# Patient Record
Sex: Female | Born: 1980 | Race: White | Hispanic: No | Smoking: Current every day smoker
Health system: Southern US, Community
[De-identification: ages and names within clinical notes are randomized; demographics above are authoritative.]

## PROBLEM LIST (undated history)

## (undated) DIAGNOSIS — J45909 Unspecified asthma, uncomplicated: Secondary | ICD-10-CM

## (undated) DIAGNOSIS — IMO0001 Reserved for inherently not codable concepts without codable children: Secondary | ICD-10-CM

## (undated) DIAGNOSIS — F319 Bipolar disorder, unspecified: Secondary | ICD-10-CM

## (undated) DIAGNOSIS — G629 Polyneuropathy, unspecified: Secondary | ICD-10-CM

## (undated) DIAGNOSIS — R55 Syncope and collapse: Secondary | ICD-10-CM

## (undated) DIAGNOSIS — F329 Major depressive disorder, single episode, unspecified: Secondary | ICD-10-CM

## (undated) DIAGNOSIS — F32A Depression, unspecified: Secondary | ICD-10-CM

## (undated) DIAGNOSIS — F419 Anxiety disorder, unspecified: Secondary | ICD-10-CM

## (undated) DIAGNOSIS — E118 Type 2 diabetes mellitus with unspecified complications: Secondary | ICD-10-CM

## (undated) DIAGNOSIS — Z794 Long term (current) use of insulin: Secondary | ICD-10-CM

## (undated) DIAGNOSIS — E1140 Type 2 diabetes mellitus with diabetic neuropathy, unspecified: Secondary | ICD-10-CM

## (undated) HISTORY — PX: TUBAL LIGATION: SHX77

---

## 2011-11-01 ENCOUNTER — Encounter (HOSPITAL_COMMUNITY): Payer: Self-pay | Admitting: *Deleted

## 2011-11-01 ENCOUNTER — Emergency Department (HOSPITAL_COMMUNITY): Payer: Medicaid Other

## 2011-11-01 ENCOUNTER — Emergency Department (HOSPITAL_COMMUNITY)
Admission: EM | Admit: 2011-11-01 | Discharge: 2011-11-02 | Disposition: A | Discharge status: Home / Self Care | Payer: Medicaid Other | Attending: Emergency Medicine | Admitting: Emergency Medicine

## 2011-11-01 ENCOUNTER — Other Ambulatory Visit: Payer: Self-pay

## 2011-11-01 DIAGNOSIS — R739 Hyperglycemia, unspecified: Secondary | ICD-10-CM

## 2011-11-01 DIAGNOSIS — J45909 Unspecified asthma, uncomplicated: Secondary | ICD-10-CM | POA: Insufficient documentation

## 2011-11-01 DIAGNOSIS — E119 Type 2 diabetes mellitus without complications: Secondary | ICD-10-CM | POA: Insufficient documentation

## 2011-11-01 DIAGNOSIS — Z794 Long term (current) use of insulin: Secondary | ICD-10-CM | POA: Insufficient documentation

## 2011-11-01 DIAGNOSIS — N390 Urinary tract infection, site not specified: Secondary | ICD-10-CM

## 2011-11-01 DIAGNOSIS — Z79899 Other long term (current) drug therapy: Secondary | ICD-10-CM | POA: Insufficient documentation

## 2011-11-01 DIAGNOSIS — E860 Dehydration: Secondary | ICD-10-CM

## 2011-11-01 HISTORY — DX: Anxiety disorder, unspecified: F41.9

## 2011-11-01 HISTORY — DX: Depression, unspecified: F32.A

## 2011-11-01 HISTORY — DX: Polyneuropathy, unspecified: G62.9

## 2011-11-01 HISTORY — DX: Unspecified asthma, uncomplicated: J45.909

## 2011-11-01 HISTORY — DX: Major depressive disorder, single episode, unspecified: F32.9

## 2011-11-01 LAB — CBC WITH DIFFERENTIAL/PLATELET
Basophils Absolute: 0 10*3/uL (ref 0.0–0.1)
Basophils Relative: 0 % (ref 0–1)
Eosinophils Absolute: 0 10*3/uL (ref 0.0–0.7)
HCT: 39.1 % (ref 36.0–46.0)
Hemoglobin: 13.5 g/dL (ref 12.0–15.0)
MCH: 30.3 pg (ref 26.0–34.0)
MCHC: 34.5 g/dL (ref 30.0–36.0)
Monocytes Absolute: 0.4 10*3/uL (ref 0.1–1.0)
Monocytes Relative: 4 % (ref 3–12)
RDW: 13.4 % (ref 11.5–15.5)

## 2011-11-01 LAB — GLUCOSE, CAPILLARY: Glucose-Capillary: 66 mg/dL — ABNORMAL LOW (ref 70–99)

## 2011-11-01 NOTE — ED Provider Notes (Signed)
History     CSN: [id number]  Arrival date & time 11/01/11  2214   First MD Initiated Contact with Patient 11/01/11 2240      Chief Complaint  Patient presents with  . Chest Pain    cough  . Emesis  . Hyperglycemia    (Consider location/radiation/quality/duration/timing/severity/associated sxs/prior treatment) HPI  31 y.o. female in no acute distress complaining of myalgias, rduced PO intake, vomiting 4 times this week (reportschildren are sick, she is now tolerating PO). She does report a pleuritic chest pain and dry cough. Patient reports she had high blood sugar yesterday (500's symptomatic with polyuria and polydipsia) on insulin pump and the insulin pump was not sitting correctly, she was not receiving her insulin today her sugars are much better. She denies any shortness of breath, urinary symptoms, change in bowel habits, HA.   Past Medical History  Diagnosis Date  . Diabetes mellitus     insulin dependant  . Asthma   . Anxiety   . Depression   . Neuropathy     Past Surgical History  Procedure Date  . Tubal ligation     No family history on file.  History  Substance Use Topics  . Smoking status: Not on file  . Smokeless tobacco: Not on file  . Alcohol Use:     OB History    Grav Para Term Preterm Abortions TAB SAB Ect Mult Living                  Review of Systems  Constitutional: Negative for fever.  Respiratory: Negative for shortness of breath.   Cardiovascular: Positive for chest pain.  Gastrointestinal: Positive for nausea and vomiting. Negative for abdominal pain and diarrhea.  Musculoskeletal: Positive for myalgias.  Skin: Negative for rash.  All other systems reviewed and are negative.    Allergies  Amoxicillin; Latex; and Penicillins  Home Medications   Current Outpatient Rx  Name Route Sig Dispense Refill  . ALBUTEROL SULFATE HFA 108 (90 BASE) MCG/ACT IN AERS Inhalation Inhale 2 puffs into the lungs every 6 (six) hours as needed.  For shortness of breath    . BECLOMETHASONE DIPROPIONATE 80 MCG/ACT IN AERS Inhalation Inhale 1 puff into the lungs 2 (two) times daily.    . INSULIN ASPART 100 UNIT/ML Parker Strip SOLN Subcutaneous Inject into the skin continuous. Via insulin pump    . ADULT MULTIVITAMIN W/MINERALS CH Oral Take 1 tablet by mouth daily.      BP 126/68  Pulse 115  Temp 99.2 F (37.3 C) (Oral)  Resp 19  SpO2 90%  LMP 10/01/2011  Physical Exam  Nursing note and vitals reviewed. Constitutional: She is oriented to person, place, and time. She appears well-developed and well-nourished. No distress.  HENT:  Head: Normocephalic.       Mucous membranes are dry  Eyes: Conjunctivae and EOM are normal.  Neck: Normal range of motion.  Cardiovascular: Normal rate, regular rhythm and normal heart sounds.   Pulmonary/Chest: Effort normal and breath sounds normal. No respiratory distress. She has no wheezes. She has no rales. She exhibits no tenderness.  Abdominal: Soft. Bowel sounds are normal.  Musculoskeletal: Normal range of motion.  Lymphadenopathy:    She has no cervical adenopathy.  Neurological: She is alert and oriented to person, place, and time.  Skin: Skin is warm.  Psychiatric: She has a normal mood and affect.    ED Course  Procedures (including critical care time)  Labs Reviewed  GLUCOSE, CAPILLARY -  Abnormal; Notable for the following:    Glucose-Capillary 66 (*)     All other components within normal limits  CBC WITH DIFFERENTIAL - Abnormal; Notable for the following:    Neutrophils Relative 89 (*)     Neutro Abs 8.6 (*)     Lymphocytes Relative 6 (*)     Lymphs Abs 0.6 (*)     All other components within normal limits  COMPREHENSIVE METABOLIC PANEL - Abnormal; Notable for the following:    Glucose, Bld 114 (*)     All other components within normal limits  PREGNANCY, URINE  URINALYSIS, ROUTINE W REFLEX MICROSCOPIC   Dg Chest Port 1 View  11/01/2011  *RADIOLOGY REPORT*  Clinical Data: Chest  pain and cough.  Hyperglycemia.  PORTABLE CHEST - 1 VIEW  Comparison: 02/03/2010  Findings: The heart size and pulmonary vascularity are normal. The lungs appear clear and expanded without focal air space disease or consolidation. No blunting of the costophrenic angles.  No pneumothorax.  Mediastinal contours appear intact.  No significant change since previous study.  IMPRESSION: No evidence of active pulmonary disease.   Original Report Authenticated By: Marlon Pel, M.D.      1. Dehydration       MDM  Patient has stable blood sugar, chest x-ray shows no infiltrates and  she is currently tolerating PO. She is tachycardic likely because she is dehydrated from Polyuria from high sugars yesterday. I will hydrate her to normalize her vital signs. I have spent extensive time counseling them that primary care needs to be established for management of chronic diabetes.  Myalgia and fever likely secondary to viral syndrome she is the same symptoms as her children. I will encourage symptomatic treatment, with Motrin for fever control.   Care remains with Dr. Effie Shy at shift change.  New Prescriptions   ONDANSETRON (ZOFRAN) 4 MG TABLET    Take 1 tablet (4 mg total) by mouth every 8 (eight) hours as needed for nausea.   OXYCODONE-ACETAMINOPHEN (PERCOCET/ROXICET) 5-325 MG PER TABLET    1 to 2 tabs PO q6hrs  PRN for pain           Wynetta Emery, PA-C 11/02/11 0126  Wynetta Emery, PA-C 11/02/11 0151

## 2011-11-01 NOTE — ED Notes (Signed)
Insulin dependant diabetic to ED c/o chest pain, cough, emesis and hyperglycemia.  Pt came to ED b/c she has been vomiting all day.  Her insulin pump was not working yesterday b/c it was kinked.

## 2011-11-01 NOTE — ED Notes (Signed)
Pt. C/o generalized pain, states "even my skin hurts". Pt. Reports vomiting today. States BG was high yesterday but was low today.

## 2011-11-02 LAB — URINALYSIS, ROUTINE W REFLEX MICROSCOPIC
Bilirubin Urine: NEGATIVE
Glucose, UA: NEGATIVE mg/dL
Ketones, ur: 15 mg/dL — AB
Nitrite: NEGATIVE
Protein, ur: NEGATIVE mg/dL
pH: 5.5 (ref 5.0–8.0)

## 2011-11-02 LAB — COMPREHENSIVE METABOLIC PANEL
Albumin: 3.9 g/dL (ref 3.5–5.2)
BUN: 10 mg/dL (ref 6–23)
Calcium: 8.7 mg/dL (ref 8.4–10.5)
Creatinine, Ser: 0.61 mg/dL (ref 0.50–1.10)
Total Protein: 6.8 g/dL (ref 6.0–8.3)

## 2011-11-02 LAB — URINE MICROSCOPIC-ADD ON

## 2011-11-02 LAB — PREGNANCY, URINE: Preg Test, Ur: NEGATIVE

## 2011-11-02 MED ORDER — KETOROLAC TROMETHAMINE 30 MG/ML IJ SOLN
30.0000 mg | Freq: Once | INTRAMUSCULAR | Status: AC
  Administered 2011-11-02: 30 mg via INTRAVENOUS
  Filled 2011-11-02: qty 1

## 2011-11-02 MED ORDER — MORPHINE SULFATE 4 MG/ML IJ SOLN
4.0000 mg | Freq: Once | INTRAMUSCULAR | Status: AC
  Administered 2011-11-02: 4 mg via INTRAVENOUS
  Filled 2011-11-02: qty 1

## 2011-11-02 MED ORDER — ONDANSETRON HCL 4 MG/2ML IJ SOLN
INTRAMUSCULAR | Status: AC
  Administered 2011-11-02: 4 mg via INTRAVENOUS
  Filled 2011-11-02: qty 2

## 2011-11-02 MED ORDER — ONDANSETRON HCL 4 MG PO TABS: 4.0000 mg | ORAL_TABLET | Freq: Three times a day (TID) | ORAL | Status: AC | PRN

## 2011-11-02 MED ORDER — OXYCODONE-ACETAMINOPHEN 5-325 MG PO TABS: ORAL_TABLET | ORAL | Status: AC

## 2011-11-02 MED ORDER — CIPROFLOXACIN HCL 500 MG PO TABS: 500.0000 mg | ORAL_TABLET | Freq: Once | ORAL | Status: AC

## 2011-11-02 MED ORDER — ONDANSETRON HCL 4 MG/2ML IJ SOLN
4.0000 mg | Freq: Once | INTRAMUSCULAR | Status: AC
  Administered 2011-11-02: 4 mg via INTRAVENOUS

## 2011-11-02 MED ORDER — CIPROFLOXACIN HCL 500 MG PO TABS
500.0000 mg | ORAL_TABLET | Freq: Once | ORAL | Status: AC
  Administered 2011-11-02: 500 mg via ORAL
  Filled 2011-11-02: qty 1

## 2011-11-02 MED ORDER — SODIUM CHLORIDE 0.9 % IV BOLUS (SEPSIS)
1000.0000 mL | Freq: Once | INTRAVENOUS | Status: AC
  Administered 2011-11-02: 1000 mL via INTRAVENOUS

## 2011-11-02 NOTE — ED Notes (Signed)
Pt refused rectal temp.

## 2011-11-02 NOTE — ED Provider Notes (Signed)
Medical screening examination/treatment/procedure(s) were conducted as a shared visit with non-physician practitioner(s) and myself.  I personally evaluated the patient during the encounter  Flint Melter, MD 11/02/11 [phone]

## 2011-11-02 NOTE — ED Provider Notes (Signed)
Melody Myers is a 31 y.o. female  who is here for vomiting, and hyperglycemia. She feels better now after treatment with IV fluids. In emergency department. Her urinalysis returned consistent with infection. She has had urinary frequency recently, but no dysuria. Heart rate, now in the 90s over the last hour. She feels comfortable with discharge at this time.   Plan: Cipro, started in emergency department, prescription given. She is encouraged to drink 1-2 extra liters of water each day and followup with her PCP, next week.   Medical screening examination/treatment/procedure(s) were conducted as a shared visit with non-physician practitioner(s) and myself.  I personally evaluated the patient during the encounter  Flint Melter, MD 11/02/11 [phone]

## 2012-10-11 ENCOUNTER — Telehealth: Payer: Self-pay | Admitting: Obstetrics & Gynecology

## 2012-10-11 NOTE — Telephone Encounter (Signed)
Called last evening because hadn't had a bowel movement but "felt like it was right there".  Moving well.  Normal urinary function.  No fevers.  Feeling fine except for some cramping due to constipation.  Advised okay to use dulcolax suppository and if no BM to start Miralax every 12 hrs until has BM.    She called again this am to report she had a BM and is really feeling good except she sneezed and her incision where hematoma had occured was oozing some dark blood.  Not active.  No surrounding erythema.  No fevers.  Not even really tender.  She had a washcloth on it and it is just oozing a little.  advised to app,y pressure and just watch. Ok to apply dressing.  Per her description, sounds like no signs of infection.  Has appt tomorrow with Dr Farrel Gobble.  Pt to call back with any changes.

## 2013-02-04 DIAGNOSIS — M259 Joint disorder, unspecified: Secondary | ICD-10-CM | POA: Insufficient documentation

## 2013-02-04 DIAGNOSIS — G5600 Carpal tunnel syndrome, unspecified upper limb: Secondary | ICD-10-CM | POA: Insufficient documentation

## 2013-02-04 HISTORY — DX: Carpal tunnel syndrome, unspecified upper limb: G56.00

## 2013-03-19 ENCOUNTER — Encounter (HOSPITAL_COMMUNITY): Payer: Self-pay | Admitting: Emergency Medicine

## 2013-03-19 ENCOUNTER — Inpatient Hospital Stay (HOSPITAL_COMMUNITY)
Admission: EM | Admit: 2013-03-19 | Discharge: 2013-03-21 | DRG: 638 | Disposition: A | Discharge status: Home / Self Care | Payer: Medicare Other | Attending: Internal Medicine | Admitting: Internal Medicine

## 2013-03-19 ENCOUNTER — Emergency Department (HOSPITAL_COMMUNITY): Payer: Medicare Other

## 2013-03-19 DIAGNOSIS — Z720 Tobacco use: Secondary | ICD-10-CM | POA: Diagnosis present

## 2013-03-19 DIAGNOSIS — Z794 Long term (current) use of insulin: Secondary | ICD-10-CM

## 2013-03-19 DIAGNOSIS — Z23 Encounter for immunization: Secondary | ICD-10-CM

## 2013-03-19 DIAGNOSIS — F419 Anxiety disorder, unspecified: Secondary | ICD-10-CM | POA: Diagnosis present

## 2013-03-19 DIAGNOSIS — E1110 Type 2 diabetes mellitus with ketoacidosis without coma: Secondary | ICD-10-CM

## 2013-03-19 DIAGNOSIS — J4540 Moderate persistent asthma, uncomplicated: Secondary | ICD-10-CM | POA: Diagnosis present

## 2013-03-19 DIAGNOSIS — E1010 Type 1 diabetes mellitus with ketoacidosis without coma: Principal | ICD-10-CM | POA: Diagnosis present

## 2013-03-19 DIAGNOSIS — G629 Polyneuropathy, unspecified: Secondary | ICD-10-CM | POA: Insufficient documentation

## 2013-03-19 DIAGNOSIS — E1040 Type 1 diabetes mellitus with diabetic neuropathy, unspecified: Secondary | ICD-10-CM | POA: Diagnosis present

## 2013-03-19 DIAGNOSIS — E118 Type 2 diabetes mellitus with unspecified complications: Secondary | ICD-10-CM

## 2013-03-19 DIAGNOSIS — D72829 Elevated white blood cell count, unspecified: Secondary | ICD-10-CM | POA: Diagnosis present

## 2013-03-19 DIAGNOSIS — J45901 Unspecified asthma with (acute) exacerbation: Secondary | ICD-10-CM | POA: Diagnosis present

## 2013-03-19 DIAGNOSIS — K5900 Constipation, unspecified: Secondary | ICD-10-CM | POA: Diagnosis present

## 2013-03-19 DIAGNOSIS — E1140 Type 2 diabetes mellitus with diabetic neuropathy, unspecified: Secondary | ICD-10-CM | POA: Diagnosis present

## 2013-03-19 DIAGNOSIS — F319 Bipolar disorder, unspecified: Secondary | ICD-10-CM | POA: Diagnosis present

## 2013-03-19 DIAGNOSIS — Z9641 Presence of insulin pump (external) (internal): Secondary | ICD-10-CM

## 2013-03-19 DIAGNOSIS — E1065 Type 1 diabetes mellitus with hyperglycemia: Secondary | ICD-10-CM | POA: Diagnosis present

## 2013-03-19 DIAGNOSIS — F17200 Nicotine dependence, unspecified, uncomplicated: Secondary | ICD-10-CM | POA: Diagnosis present

## 2013-03-19 DIAGNOSIS — F411 Generalized anxiety disorder: Secondary | ICD-10-CM | POA: Diagnosis present

## 2013-03-19 DIAGNOSIS — E1142 Type 2 diabetes mellitus with diabetic polyneuropathy: Secondary | ICD-10-CM | POA: Diagnosis present

## 2013-03-19 DIAGNOSIS — J45909 Unspecified asthma, uncomplicated: Secondary | ICD-10-CM | POA: Diagnosis present

## 2013-03-19 DIAGNOSIS — F3130 Bipolar disorder, current episode depressed, mild or moderate severity, unspecified: Secondary | ICD-10-CM | POA: Diagnosis present

## 2013-03-19 DIAGNOSIS — E1049 Type 1 diabetes mellitus with other diabetic neurological complication: Secondary | ICD-10-CM | POA: Diagnosis present

## 2013-03-19 DIAGNOSIS — F32A Depression, unspecified: Secondary | ICD-10-CM | POA: Diagnosis present

## 2013-03-19 DIAGNOSIS — F329 Major depressive disorder, single episode, unspecified: Secondary | ICD-10-CM | POA: Diagnosis present

## 2013-03-19 DIAGNOSIS — G8929 Other chronic pain: Secondary | ICD-10-CM | POA: Diagnosis present

## 2013-03-19 HISTORY — DX: Type 2 diabetes mellitus with ketoacidosis without coma: E11.10

## 2013-03-19 HISTORY — DX: Type 2 diabetes mellitus with unspecified complications: E11.8

## 2013-03-19 HISTORY — DX: Type 2 diabetes mellitus with diabetic neuropathy, unspecified: E11.40

## 2013-03-19 HISTORY — DX: Reserved for inherently not codable concepts without codable children: IMO0001

## 2013-03-19 HISTORY — DX: Long term (current) use of insulin: Z79.4

## 2013-03-19 LAB — GLUCOSE, CAPILLARY
GLUCOSE-CAPILLARY: 495 mg/dL — AB (ref 70–99)
Glucose-Capillary: 490 mg/dL — ABNORMAL HIGH (ref 70–99)

## 2013-03-19 LAB — POCT I-STAT, CHEM 8
BUN: 19 mg/dL (ref 6–23)
CALCIUM ION: 1.12 mmol/L (ref 1.12–1.23)
Chloride: 96 mEq/L (ref 96–112)
Creatinine, Ser: 0.8 mg/dL (ref 0.50–1.10)
Glucose, Bld: 552 mg/dL (ref 70–99)
HCT: 46 % (ref 36.0–46.0)
HEMOGLOBIN: 15.6 g/dL — AB (ref 12.0–15.0)
Potassium: 4.5 mEq/L (ref 3.7–5.3)
SODIUM: 130 meq/L — AB (ref 137–147)
TCO2: 20 mmol/L (ref 0–100)

## 2013-03-19 LAB — COMPREHENSIVE METABOLIC PANEL
ALBUMIN: 4.2 g/dL (ref 3.5–5.2)
ALK PHOS: 93 U/L (ref 39–117)
ALT: 11 U/L (ref 0–35)
AST: 12 U/L (ref 0–37)
BILIRUBIN TOTAL: 0.5 mg/dL (ref 0.3–1.2)
BUN: 17 mg/dL (ref 6–23)
CHLORIDE: 90 meq/L — AB (ref 96–112)
CO2: 17 meq/L — AB (ref 19–32)
CREATININE: 0.77 mg/dL (ref 0.50–1.10)
Calcium: 9.5 mg/dL (ref 8.4–10.5)
GFR calc Af Amer: >90 mL/min (ref >90)
Glucose, Bld: 541 mg/dL — ABNORMAL HIGH (ref 70–99)
POTASSIUM: 4.6 meq/L (ref 3.7–5.3)
Sodium: 128 mEq/L — ABNORMAL LOW (ref 137–147)
Total Protein: 7.6 g/dL (ref 6.0–8.3)

## 2013-03-19 LAB — CBC
HEMATOCRIT: 42.3 % (ref 36.0–46.0)
Hemoglobin: 14.5 g/dL (ref 12.0–15.0)
MCH: 29.3 pg (ref 26.0–34.0)
MCHC: 34.3 g/dL (ref 30.0–36.0)
MCV: 85.5 fL (ref 78.0–100.0)
PLATELETS: 180 10*3/uL (ref 150–400)
RBC: 4.95 MIL/uL (ref 3.87–5.11)
RDW: 13.3 % (ref 11.5–15.5)
WBC: 16.6 10*3/uL — AB (ref 4.0–10.5)

## 2013-03-19 LAB — URINALYSIS, ROUTINE W REFLEX MICROSCOPIC
BILIRUBIN URINE: NEGATIVE
HGB URINE DIPSTICK: NEGATIVE
KETONES UR: 40 mg/dL — AB
Leukocytes, UA: NEGATIVE
Nitrite: NEGATIVE
PROTEIN: NEGATIVE mg/dL
Specific Gravity, Urine: 1.028 (ref 1.005–1.030)
UROBILINOGEN UA: 0.2 mg/dL (ref 0.0–1.0)
pH: 5.5 (ref 5.0–8.0)

## 2013-03-19 LAB — URINE MICROSCOPIC-ADD ON

## 2013-03-19 LAB — POCT I-STAT TROPONIN I: TROPONIN I, POC: 0.02 ng/mL (ref 0.00–0.08)

## 2013-03-19 LAB — POCT PREGNANCY, URINE: PREG TEST UR: NEGATIVE

## 2013-03-19 LAB — LIPASE, BLOOD: LIPASE: 19 U/L (ref 11–59)

## 2013-03-19 MED ORDER — ALBUTEROL (5 MG/ML) CONTINUOUS INHALATION SOLN
15.0000 mg/h | INHALATION_SOLUTION | Freq: Once | RESPIRATORY_TRACT | Status: AC
  Administered 2013-03-19: 15 mg/h via RESPIRATORY_TRACT
  Filled 2013-03-19: qty 20

## 2013-03-19 MED ORDER — METHYLPREDNISOLONE SODIUM SUCC 125 MG IJ SOLR
125.0000 mg | Freq: Once | INTRAMUSCULAR | Status: AC
  Administered 2013-03-19: 125 mg via INTRAVENOUS
  Filled 2013-03-19: qty 2

## 2013-03-19 MED ORDER — INSULIN REGULAR BOLUS VIA INFUSION
0.0000 [IU] | Freq: Three times a day (TID) | INTRAVENOUS | Status: DC
  Administered 2013-03-20: 2 [IU] via INTRAVENOUS
  Administered 2013-03-20: 2.8 [IU] via INTRAVENOUS
  Administered 2013-03-20: 3.1 [IU] via INTRAVENOUS
  Filled 2013-03-19: qty 10

## 2013-03-19 MED ORDER — SODIUM CHLORIDE 0.9 % IV BOLUS (SEPSIS)
1000.0000 mL | Freq: Once | INTRAVENOUS | Status: AC
  Administered 2013-03-19: 1000 mL via INTRAVENOUS

## 2013-03-19 MED ORDER — INSULIN REGULAR HUMAN 100 UNIT/ML IJ SOLN
INTRAMUSCULAR | Status: DC
  Administered 2013-03-19: 4.4 [IU]/h via INTRAVENOUS
  Administered 2013-03-20: 11.6 [IU]/h via INTRAVENOUS
  Administered 2013-03-20: 5.4 [IU]/h via INTRAVENOUS
  Administered 2013-03-20: 7.1 [IU]/h via INTRAVENOUS
  Administered 2013-03-20: 8.3 [IU]/h via INTRAVENOUS
  Administered 2013-03-20: 10 [IU]/h via INTRAVENOUS
  Administered 2013-03-20: 3.4 [IU]/h via INTRAVENOUS
  Administered 2013-03-20: 11:00:00 via INTRAVENOUS
  Administered 2013-03-20: 11.5 [IU]/h via INTRAVENOUS
  Filled 2013-03-19 (×2): qty 1

## 2013-03-19 MED ORDER — IPRATROPIUM BROMIDE 0.02 % IN SOLN
1.0000 mg | Freq: Once | RESPIRATORY_TRACT | Status: AC
  Administered 2013-03-19: 1 mg via RESPIRATORY_TRACT
  Filled 2013-03-19: qty 5

## 2013-03-19 MED ORDER — DEXTROSE-NACL 5-0.45 % IV SOLN
INTRAVENOUS | Status: DC
  Administered 2013-03-20: 18:00:00 via INTRAVENOUS
  Administered 2013-03-20: 75 mL/h via INTRAVENOUS

## 2013-03-19 MED ORDER — DEXTROSE 50 % IV SOLN: 25.0000 mL | INTRAVENOUS | Status: DC | PRN

## 2013-03-19 MED ORDER — SODIUM CHLORIDE 0.9 % IV SOLN
INTRAVENOUS | Status: DC
  Administered 2013-03-19: 125 mL/h via INTRAVENOUS
  Administered 2013-03-21: 02:00:00 via INTRAVENOUS

## 2013-03-19 NOTE — ED Notes (Signed)
Dr Silverio Lay aware of critical labs on I stat chem 8

## 2013-03-19 NOTE — ED Notes (Signed)
Per EMS , Pt. Walked in to [facility]. And complaint of "flu like symptoms" and also claimed of having hyperglycemia, sugar checked on the scene was 544 mg/dl., pt. Reports that she has an insulin pump and she just gave her a bolus and did not say how much.  Pt denies SOB with also claimed of chills and cough.

## 2013-03-19 NOTE — ED Provider Notes (Signed)
CSN: [id number]     Arrival date & time 03/19/13  2142 History   First MD Initiated Contact with Patient 03/19/13 2159     Chief Complaint  Patient presents with  . Hyperglycemia  . flu like sypmtom    (Consider location/radiation/quality/duration/timing/severity/associated sxs/prior Treatment) The history is provided by the patient.  Melody Myers is a 33 y.o. female hx of DM, asthma, depression and with flu syndrome, hyperglycemia. Patient has some flulike symptoms for the last 3 days. She's been wheezing throughout and has been using her albuterol with minimal relief. Also been nauseous and vomiting today and notes that her blood sugar was 544 at home. Some chills no fever. She is an dependent diabetic and had an insulin pump.    Past Medical History  Diagnosis Date  . Diabetes mellitus     insulin dependant  . Asthma   . Anxiety   . Depression   . Neuropathy    Past Surgical History  Procedure Laterality Date  . Tubal ligation     No family history on file. History  Substance Use Topics  . Smoking status: Not on file  . Smokeless tobacco: Not on file  . Alcohol Use: Yes     Comment: occasional   OB History   Grav Para Term Preterm Abortions TAB SAB Ect Mult Living                 Review of Systems  Respiratory: Positive for shortness of breath.   Gastrointestinal: Positive for nausea.  All other systems reviewed and are negative.    Allergies  Amoxicillin; Latex; and Penicillins  Home Medications   Current Outpatient Rx  Name  Route  Sig  Dispense  Refill  . albuterol (PROVENTIL HFA;VENTOLIN HFA) 108 (90 BASE) MCG/ACT inhaler   Inhalation   Inhale 2 puffs into the lungs every 6 (six) hours as needed. For shortness of breath         . beclomethasone (QVAR) 80 MCG/ACT inhaler   Inhalation   Inhale 1 puff into the lungs 2 (two) times daily.         . insulin aspart (NOVOLOG) 100 UNIT/ML injection   Subcutaneous   Inject into the skin continuous. Via  insulin pump         . Multiple Vitamin (MULTIVITAMIN WITH MINERALS) TABS   Oral   Take 1 tablet by mouth daily.          BP 116/64  Pulse 111  Temp(Src) 98.5 F (36.9 C) (Oral)  Resp 24  Ht 5\' 4"  (1.626 m)  Wt 124 lb (56.246 kg)  BMI 21.27 kg/m2  SpO2 99%  LMP 03/12/2013 Physical Exam  Nursing note and vitals reviewed. Constitutional: She is oriented to person, place, and time.  Chronically ill, tired, dehydrated   HENT:  Head: Normocephalic and atraumatic.  MM dry   Eyes: Conjunctivae are normal. Pupils are equal, round, and reactive to light.  Neck: Normal range of motion. Neck supple.  Cardiovascular: Regular rhythm and normal heart sounds.   Tachy   Pulmonary/Chest:  Diffuse wheezing, no retractions   Abdominal: Soft. Bowel sounds are normal.  Firm, mild epigastric tenderness. Insulin pump seem to be in place with no surrounding erythema   Musculoskeletal: Normal range of motion.  Neurological: She is alert and oriented to person, place, and time.  Skin: Skin is warm and dry.  Psychiatric: She has a normal mood and affect. Her behavior is normal. Judgment and thought content  normal.    ED Course  Procedures (including critical care time) CRITICAL CARE Performed by: Silverio Lay, DAVID   Total critical care time: 30 min   Critical care time was exclusive of separately billable procedures and treating other patients.  Critical care was necessary to treat or prevent imminent or life-threatening deterioration.  Critical care was time spent personally by me on the following activities: development of treatment plan with patient and/or surrogate as well as nursing, discussions with consultants, evaluation of patient's response to treatment, examination of patient, obtaining history from patient or surrogate, ordering and performing treatments and interventions, ordering and review of laboratory studies, ordering and review of radiographic studies, pulse oximetry and  re-evaluation of patient's condition.   Labs Review Labs Reviewed  CBC - Abnormal; Notable for the following:    WBC 16.6 (*)    All other components within normal limits  COMPREHENSIVE METABOLIC PANEL - Abnormal; Notable for the following:    Glucose, Bld 541 (*)    All other components within normal limits  GLUCOSE, CAPILLARY - Abnormal; Notable for the following:    Glucose-Capillary 490 (*)    All other components within normal limits  GLUCOSE, CAPILLARY - Abnormal; Notable for the following:    Glucose-Capillary 495 (*)    All other components within normal limits  POCT I-STAT, CHEM 8 - Abnormal; Notable for the following:    Sodium 130 (*)    Glucose, Bld 552 (*)    Hemoglobin 15.6 (*)    All other components within normal limits  URINALYSIS, ROUTINE W REFLEX MICROSCOPIC  INFLUENZA PANEL BY PCR (TYPE A & B, H1N1)  LIPASE, BLOOD  POCT PREGNANCY, URINE  POCT I-STAT TROPONIN I   Imaging Review Dg Chest Portable 1 View  03/19/2013   CLINICAL DATA:  Shortness of breath.  Wheezing and cough  EXAM: PORTABLE CHEST - 1 VIEW  COMPARISON:  01/28/2013  FINDINGS: The heart size and mediastinal contours are within normal limits. Both lungs are clear. The visualized skeletal structures are unremarkable.  IMPRESSION: No active disease.   Electronically Signed   By: Signa Kell M.D.   On: 03/19/2013 22:35    EKG Interpretation    Date/Time:  Friday March 19 2013 23:05:49 EST Ventricular Rate:  117 PR Interval:  143 QRS Duration: 97 QT Interval:  333 QTC Calculation: 465 R Axis:   -71 Text Interpretation:  Sinus tachycardia Left anterior fascicular block RSR' in V1 or V2, right VCD or RVH Repol abnrm,  No significant change since last tracing Confirmed by YAO  MD, DAVID [phone]) on 03/19/2013 11:09:51 PM            MDM  No diagnosis found. Melody Myers is a 33 y.o. female here with hyperglycemia, asthma exacerbation. She is insulin dependent. I am concerned that insulin  pump may have malfunctioned and that she is in DKA. Will start insulin drip. Wheezing on exam so will give albuterol. Will hold off on steroids given hyperglycemia.   11:30 PM Still wheezing, will give albuterol. Has AG of 20 on istat. Insulin drip started. Will admit to stepdown.     Richardean Canal, MD 03/19/13 2330

## 2013-03-19 NOTE — ED Notes (Signed)
MD at bedside. 

## 2013-03-19 NOTE — ED Notes (Signed)
Pt.'s INSULIN PUMP TURNED OFF!!!

## 2013-03-20 ENCOUNTER — Encounter (HOSPITAL_COMMUNITY): Payer: Self-pay | Admitting: Family Medicine

## 2013-03-20 DIAGNOSIS — F319 Bipolar disorder, unspecified: Secondary | ICD-10-CM

## 2013-03-20 DIAGNOSIS — J45901 Unspecified asthma with (acute) exacerbation: Secondary | ICD-10-CM

## 2013-03-20 DIAGNOSIS — F411 Generalized anxiety disorder: Secondary | ICD-10-CM

## 2013-03-20 DIAGNOSIS — D72829 Elevated white blood cell count, unspecified: Secondary | ICD-10-CM

## 2013-03-20 DIAGNOSIS — Z720 Tobacco use: Secondary | ICD-10-CM

## 2013-03-20 DIAGNOSIS — E1149 Type 2 diabetes mellitus with other diabetic neurological complication: Secondary | ICD-10-CM

## 2013-03-20 DIAGNOSIS — F17200 Nicotine dependence, unspecified, uncomplicated: Secondary | ICD-10-CM | POA: Diagnosis present

## 2013-03-20 DIAGNOSIS — E1110 Type 2 diabetes mellitus with ketoacidosis without coma: Secondary | ICD-10-CM

## 2013-03-20 DIAGNOSIS — E1140 Type 2 diabetes mellitus with diabetic neuropathy, unspecified: Secondary | ICD-10-CM | POA: Diagnosis present

## 2013-03-20 DIAGNOSIS — E1040 Type 1 diabetes mellitus with diabetic neuropathy, unspecified: Secondary | ICD-10-CM | POA: Diagnosis present

## 2013-03-20 DIAGNOSIS — E1142 Type 2 diabetes mellitus with diabetic polyneuropathy: Secondary | ICD-10-CM

## 2013-03-20 DIAGNOSIS — G8929 Other chronic pain: Secondary | ICD-10-CM

## 2013-03-20 DIAGNOSIS — Z794 Long term (current) use of insulin: Secondary | ICD-10-CM

## 2013-03-20 DIAGNOSIS — E118 Type 2 diabetes mellitus with unspecified complications: Secondary | ICD-10-CM

## 2013-03-20 HISTORY — DX: Tobacco use: Z72.0

## 2013-03-20 HISTORY — DX: Elevated white blood cell count, unspecified: D72.829

## 2013-03-20 HISTORY — DX: Bipolar disorder, unspecified: F31.9

## 2013-03-20 HISTORY — DX: Other chronic pain: G89.29

## 2013-03-20 LAB — BASIC METABOLIC PANEL
BUN: 11 mg/dL (ref 6–23)
BUN: 9 mg/dL (ref 6–23)
BUN: 8 mg/dL (ref 6–23)
BUN: 7 mg/dL (ref 6–23)
CALCIUM: 8.7 mg/dL (ref 8.4–10.5)
CALCIUM: 8.9 mg/dL (ref 8.4–10.5)
CALCIUM: 8.9 mg/dL (ref 8.4–10.5)
CHLORIDE: 103 meq/L (ref 96–112)
CO2: 18 mEq/L — ABNORMAL LOW (ref 19–32)
CO2: 20 mEq/L (ref 19–32)
CO2: 19 mEq/L (ref 19–32)
CO2: 24 mEq/L (ref 19–32)
CREATININE: 0.51 mg/dL (ref 0.50–1.10)
CREATININE: 0.57 mg/dL (ref 0.50–1.10)
Calcium: 8.7 mg/dL (ref 8.4–10.5)
Chloride: 105 mEq/L (ref 96–112)
Chloride: 105 mEq/L (ref 96–112)
Chloride: 106 mEq/L (ref 96–112)
Creatinine, Ser: 0.54 mg/dL (ref 0.50–1.10)
Creatinine, Ser: 0.47 mg/dL — ABNORMAL LOW (ref 0.50–1.10)
GFR calc Af Amer: >90 mL/min (ref >90)
GFR calc Af Amer: >90 mL/min (ref >90)
GFR calc Af Amer: >90 mL/min (ref >90)
GFR calc non Af Amer: >90 mL/min (ref >90)
GLUCOSE: 271 mg/dL — AB (ref 70–99)
Glucose, Bld: 268 mg/dL — ABNORMAL HIGH (ref 70–99)
Glucose, Bld: 249 mg/dL — ABNORMAL HIGH (ref 70–99)
Glucose, Bld: 192 mg/dL — ABNORMAL HIGH (ref 70–99)
POTASSIUM: 3.7 meq/L (ref 3.7–5.3)
Potassium: 3.9 mEq/L (ref 3.7–5.3)
Potassium: 3.9 mEq/L (ref 3.7–5.3)
Potassium: 3.9 mEq/L (ref 3.7–5.3)
Sodium: 136 mEq/L — ABNORMAL LOW (ref 137–147)
Sodium: 138 mEq/L (ref 137–147)
Sodium: 139 mEq/L (ref 137–147)
Sodium: 140 mEq/L (ref 137–147)

## 2013-03-20 LAB — GLUCOSE, CAPILLARY
GLUCOSE-CAPILLARY: 331 mg/dL — AB (ref 70–99)
GLUCOSE-CAPILLARY: 335 mg/dL — AB (ref 70–99)
GLUCOSE-CAPILLARY: 309 mg/dL — AB (ref 70–99)
GLUCOSE-CAPILLARY: 251 mg/dL — AB (ref 70–99)
GLUCOSE-CAPILLARY: 231 mg/dL — AB (ref 70–99)
GLUCOSE-CAPILLARY: 243 mg/dL — AB (ref 70–99)
GLUCOSE-CAPILLARY: 254 mg/dL — AB (ref 70–99)
GLUCOSE-CAPILLARY: 201 mg/dL — AB (ref 70–99)
GLUCOSE-CAPILLARY: 246 mg/dL — AB (ref 70–99)
GLUCOSE-CAPILLARY: 129 mg/dL — AB (ref 70–99)
GLUCOSE-CAPILLARY: 155 mg/dL — AB (ref 70–99)
Glucose-Capillary: 150 mg/dL — ABNORMAL HIGH (ref 70–99)
Glucose-Capillary: 184 mg/dL — ABNORMAL HIGH (ref 70–99)
Glucose-Capillary: 201 mg/dL — ABNORMAL HIGH (ref 70–99)
Glucose-Capillary: 241 mg/dL — ABNORMAL HIGH (ref 70–99)
Glucose-Capillary: 248 mg/dL — ABNORMAL HIGH (ref 70–99)
Glucose-Capillary: 248 mg/dL — ABNORMAL HIGH (ref 70–99)
Glucose-Capillary: 272 mg/dL — ABNORMAL HIGH (ref 70–99)
Glucose-Capillary: 291 mg/dL — ABNORMAL HIGH (ref 70–99)
Glucose-Capillary: 316 mg/dL — ABNORMAL HIGH (ref 70–99)
Glucose-Capillary: 397 mg/dL — ABNORMAL HIGH (ref 70–99)

## 2013-03-20 LAB — CBC
HEMATOCRIT: 35.3 % — AB (ref 36.0–46.0)
HEMOGLOBIN: 12.2 g/dL (ref 12.0–15.0)
MCH: 29 pg (ref 26.0–34.0)
MCHC: 34.6 g/dL (ref 30.0–36.0)
MCV: 83.8 fL (ref 78.0–100.0)
Platelets: 192 10*3/uL (ref 150–400)
RBC: 4.21 MIL/uL (ref 3.87–5.11)
RDW: 13 % (ref 11.5–15.5)
WBC: 6.9 10*3/uL (ref 4.0–10.5)

## 2013-03-20 LAB — INFLUENZA PANEL BY PCR (TYPE A & B)
H1N1FLUPCR: NOT DETECTED — AB
Influenza A By PCR: NEGATIVE
Influenza B By PCR: NEGATIVE

## 2013-03-20 LAB — STREP PNEUMONIAE URINARY ANTIGEN: STREP PNEUMO URINARY ANTIGEN: NEGATIVE

## 2013-03-20 MED ORDER — LEVOFLOXACIN IN D5W 750 MG/150ML IV SOLN
750.0000 mg | Freq: Every day | INTRAVENOUS | Status: DC
  Administered 2013-03-20: 750 mg via INTRAVENOUS
  Filled 2013-03-20 (×2): qty 150

## 2013-03-20 MED ORDER — PREDNISONE 20 MG PO TABS
40.0000 mg | ORAL_TABLET | Freq: Every day | ORAL | Status: DC
  Administered 2013-03-21: 40 mg via ORAL
  Filled 2013-03-20 (×2): qty 2

## 2013-03-20 MED ORDER — PANTOPRAZOLE SODIUM 40 MG PO TBEC
80.0000 mg | DELAYED_RELEASE_TABLET | Freq: Every day | ORAL | Status: DC
  Administered 2013-03-20 – 2013-03-21 (×2): 80 mg via ORAL
  Filled 2013-03-20 (×2): qty 2

## 2013-03-20 MED ORDER — TRAMADOL HCL 50 MG PO TABS: 50.0000 mg | ORAL_TABLET | Freq: Four times a day (QID) | ORAL | Status: DC | PRN

## 2013-03-20 MED ORDER — ONDANSETRON HCL 4 MG PO TABS
4.0000 mg | ORAL_TABLET | Freq: Three times a day (TID) | ORAL | Status: DC | PRN
  Administered 2013-03-20: 4 mg via ORAL
  Filled 2013-03-20: qty 1

## 2013-03-20 MED ORDER — ALBUTEROL SULFATE (2.5 MG/3ML) 0.083% IN NEBU: 2.5000 mg | INHALATION_SOLUTION | RESPIRATORY_TRACT | Status: DC | PRN

## 2013-03-20 MED ORDER — PNEUMOCOCCAL VAC POLYVALENT 25 MCG/0.5ML IJ INJ
0.5000 mL | INJECTION | INTRAMUSCULAR | Status: AC
  Administered 2013-03-21: 0.5 mL via INTRAMUSCULAR
  Filled 2013-03-20: qty 0.5

## 2013-03-20 MED ORDER — SODIUM CHLORIDE 0.9 % IJ SOLN
3.0000 mL | Freq: Two times a day (BID) | INTRAMUSCULAR | Status: DC
  Administered 2013-03-21: 3 mL via INTRAVENOUS

## 2013-03-20 MED ORDER — POLYETHYLENE GLYCOL 3350 17 G PO PACK
17.0000 g | PACK | Freq: Every day | ORAL | Status: DC
  Administered 2013-03-20 – 2013-03-21 (×2): 17 g via ORAL
  Filled 2013-03-20 (×2): qty 1

## 2013-03-20 MED ORDER — NICOTINE 7 MG/24HR TD PT24
7.0000 mg | MEDICATED_PATCH | Freq: Every day | TRANSDERMAL | Status: DC
  Administered 2013-03-20 – 2013-03-21 (×2): 7 mg via TRANSDERMAL
  Filled 2013-03-20 (×2): qty 1

## 2013-03-20 MED ORDER — ALPRAZOLAM 0.5 MG PO TABS
0.5000 mg | ORAL_TABLET | Freq: Three times a day (TID) | ORAL | Status: DC
  Administered 2013-03-20 – 2013-03-21 (×5): 1 mg via ORAL
  Filled 2013-03-20 (×6): qty 2

## 2013-03-20 MED ORDER — SODIUM CHLORIDE 0.9 % IV SOLN: INTRAVENOUS | Status: DC

## 2013-03-20 MED ORDER — INFLUENZA VAC SPLIT QUAD 0.5 ML IM SUSP
0.5000 mL | INTRAMUSCULAR | Status: AC
  Administered 2013-03-21: 0.5 mL via INTRAMUSCULAR
  Filled 2013-03-20: qty 0.5

## 2013-03-20 MED ORDER — METHYLPREDNISOLONE SODIUM SUCC 125 MG IJ SOLR
125.0000 mg | Freq: Every day | INTRAMUSCULAR | Status: DC
  Administered 2013-03-20: 125 mg via INTRAVENOUS
  Filled 2013-03-20 (×2): qty 2

## 2013-03-20 MED ORDER — PREGABALIN 75 MG PO CAPS
75.0000 mg | ORAL_CAPSULE | Freq: Two times a day (BID) | ORAL | Status: DC
  Administered 2013-03-20 – 2013-03-21 (×3): 75 mg via ORAL
  Filled 2013-03-20 (×3): qty 1

## 2013-03-20 MED ORDER — FLUTICASONE PROPIONATE HFA 44 MCG/ACT IN AERO
1.0000 | INHALATION_SPRAY | Freq: Two times a day (BID) | RESPIRATORY_TRACT | Status: DC
  Administered 2013-03-20 – 2013-03-21 (×3): 1 via RESPIRATORY_TRACT
  Filled 2013-03-20 (×2): qty 10.6

## 2013-03-20 MED ORDER — ENOXAPARIN SODIUM 40 MG/0.4ML ~~LOC~~ SOLN
40.0000 mg | Freq: Every day | SUBCUTANEOUS | Status: DC
  Administered 2013-03-20 – 2013-03-21 (×2): 40 mg via SUBCUTANEOUS
  Filled 2013-03-20 (×2): qty 0.4

## 2013-03-20 MED ORDER — ALBUTEROL SULFATE (2.5 MG/3ML) 0.083% IN NEBU
2.5000 mg | INHALATION_SOLUTION | Freq: Four times a day (QID) | RESPIRATORY_TRACT | Status: DC
  Administered 2013-03-20 (×3): 2.5 mg via RESPIRATORY_TRACT
  Filled 2013-03-20 (×3): qty 3

## 2013-03-20 NOTE — ED Notes (Signed)
MD at bedside. 

## 2013-03-20 NOTE — H&P (Signed)
[facility] History and Physical  Clarity Ciszek JXB:[id number] DOB: 06-Sep-1980 DOA: 03/19/2013  Referring physician: Dr. Silverio Lay PCP: No primary provider on file.  Specialists: goes to [employer] for endocrinology and [facility] for psychiatry  Chief Complaint: hyperglycemia and asthma exacerbation  HPI: Melody Myers is a 33 y.o. female  Melody Myers is a pleasant 33 year old Caucasian female who unfortunately has a long-standing history of insulin-dependent diabetes mellitus as well as asthma. She also has a history of anxiety, depression, bipolar, chronic pain, neuropathy secondary to diabetes and tobacco abuse.  She was in her usual state of health until 2 weeks ago when she began feeling slightly more weak than usual. One week ago she began having increased cough that progressed throughout this past week. The last few days she's had coughing at night which wakes her up. She's been using her albuterol inhaler multiple times during the day in the last couple of days it doesn't seem to help. She doesn't recall any myalgias or fevers during this week. The past 3 days she felt that her anxiety was worse than usual. Her old insulin pump had not been working correctly and so one week ago she got a new one. It was working well until the day before she came to the hospital and her sugars had been in the 100s. On Thursday they were in the 200s and when she came to the hospital on Friday her sugar was found to be 540. In the emergency department she was found to be in DKA with an anion gap of 20 and was started on insulin drip and her sugars are are in the 300s. She was given an albuterol continuous neb and some Solu-Medrol. Her breathing has improved greatly she says. Chest x-ray in the ER was unremarkable however it was a 1 view portable film.  Review of Systems: The patient denies anorexia, fever, weight loss,, vision loss, decreased hearing, hoarseness, chest pain, syncope,peripheral edema,  balance deficits, hemoptysis, abdominal pain, melena, hematochezia, severe indigestion/heartburn, hematuria, incontinence, genital sores, muscle weakness, suspicious skin lesions, transient blindness, difficulty walking,  unusual weight change, abnormal bleeding, enlarged lymph nodes, angioedema, and breast masses.  She does endorse shortness of breath, increased thirst, increased urination, and as per history of present illness  Past Medical History  Diagnosis Date  . Insulin dependent diabetes mellitus with complications     insulin dependant  . Asthma   . Anxiety   . Depression   . Diabetic neuropathy    Past Surgical History  Procedure Laterality Date  . Tubal ligation     Social History:  reports that she drinks alcohol minimally, 1-2 times a year on holidays. She smokes a quarter pack per day. She denies any illegal drug use.  She lives at home and is able to do her ADLs  Allergies  Allergen Reactions  . Amoxicillin Itching  . Latex Hives  . Penicillins Itching    Family history is unremarkable  Prior to Admission medications   Medication Sig Start Date End Date Taking? Authorizing Provider  albuterol (PROVENTIL HFA;VENTOLIN HFA) 108 (90 BASE) MCG/ACT inhaler Inhale 2 puffs into the lungs every 6 (six) hours as needed. For shortness of breath   Yes Historical Provider, MD  ALPRAZolam Prudy Feeler) 1 MG tablet Take 0.5-1 mg by mouth 4 (four) times daily.   Yes Historical Provider, MD  beclomethasone (QVAR) 80 MCG/ACT inhaler Inhale 1 puff into the lungs 2 (two) times daily.   Yes Historical Provider, MD  esomeprazole (NEXIUM)  40 MG capsule Take 40 mg by mouth daily at 12 noon.   Yes Historical Provider, MD  insulin aspart (NOVOLOG) 100 UNIT/ML injection Inject into the skin continuous. Via insulin pump   Yes Historical Provider, MD  Multiple Vitamin (MULTIVITAMIN WITH MINERALS) TABS Take 1 tablet by mouth daily.   Yes Historical Provider, MD  ondansetron (ZOFRAN) 4 MG tablet Take  4 mg by mouth every 8 (eight) hours as needed for nausea or vomiting.   Yes Historical Provider, MD  pregabalin (LYRICA) 75 MG capsule Take 75 mg by mouth 2 (two) times daily.   Yes Historical Provider, MD  traMADol (ULTRAM) 50 MG tablet Take 50 mg by mouth every 6 (six) hours as needed.   Yes Historical Provider, MD   Physical Exam: Filed Vitals:   03/19/13 2357  BP: 144/43  Pulse: 133  Temp: 99.8 F (37.7 C)  Resp:     Nursing note and vitals reviewed. Constitutional: She is oriented to person, place, and time. She appears well-developed and well-nourished.  she is thin. Mucous membranes are slightly dry. HENT:  Right Ear: External ear normal.  Left Ear: External ear normal.  Nose: Nose normal.  Mouth/Throat: Oropharynx is clear and tachy. No oropharyngeal exudate.  Eyes: Conjunctivae are normal. Pupils are equal, round, and reactive to light.  Neck: Normal range of motion. Neck supple. No thyromegaly present.  Cardiovascular: Tachycardic on exam (appropriate being that she just had a continuous albuterol nebulizer), regular rhythm and normal heart sounds.   Pulmonary/Chest: Effort normal and only slightly decreased breath sounds. Expiratory wheezes at bilateral bases. No crackles. Abdominal: Soft. Bowel sounds are normal. She exhibits no distension. There is no tenderness. There is no rebound.  Lymphadenopathy:    She has no cervical adenopathy.  Neurological: She is alert and oriented to person, place, and time. She has normal reflexes.  Skin: Skin is warm and dry.  Psychiatric: She has a normal mood and affect. She does seem anxious   Labs on Admission:  Basic Metabolic Panel:  Recent Labs Lab 03/19/13 2210 03/19/13 2249  NA 128* 130*  K 4.6 4.5  CL 90* 96  CO2 17*  --   GLUCOSE 541* 552*  BUN 17 19  CREATININE 0.77 0.80  CALCIUM 9.5  --    Liver Function Tests:  Recent Labs Lab 03/19/13 2210  AST 12  ALT 11  ALKPHOS 93  BILITOT 0.5  PROT 7.6  ALBUMIN  4.2    Recent Labs Lab 03/19/13 2210  LIPASE 19   No results found for this basename: AMMONIA,  in the last 168 hours CBC:  Recent Labs Lab 03/19/13 2210 03/19/13 2249  WBC 16.6*  --   HGB 14.5 15.6*  HCT 42.3 46.0  MCV 85.5  --   PLT 180  --    Cardiac Enzymes: No results found for this basename: CKTOTAL, CKMB, CKMBINDEX, TROPONINI,  in the last 168 hours  BNP (last 3 results) No results found for this basename: PROBNP,  in the last 8760 hours CBG:  Recent Labs Lab 03/19/13 2157 03/19/13 2259 03/20/13 0007  GLUCAP 490* 495* 397*    Radiological Exams on Admission: Dg Chest Portable 1 View  03/19/2013   CLINICAL DATA:  Shortness of breath.  Wheezing and cough  EXAM: PORTABLE CHEST - 1 VIEW  COMPARISON:  01/28/2013  FINDINGS: The heart size and mediastinal contours are within normal limits. Both lungs are clear. The visualized skeletal structures are unremarkable.  IMPRESSION: No  active disease.   Electronically Signed   By: Signa Kellaylor  Stroud M.D.   On: 03/19/2013 22:35    EKG: Independently reviewed.nsr with tachycardia  Assessment/Plan Active Problems:   DKA (diabetic ketoacidoses)   Asthma   Anxiety   Depression   Diabetic neuropathy   Insulin dependent diabetes mellitus with complications   Leukocytosis   Bipolar depression   Chronic pain   Tobacco abuse  Problem 1: Insulin-dependent diabetes mellitus with ketoacidosis: Will admit this patient to telemetry and continue her insulin drip. I've made her n.p.o. except for ice chips but I expect she'll be able to start a diet tomorrow. We'll need to find out what the problem was with her insulin pump before discharge. I've ordered an A1c since the patient wasn't sure when her last 1 was.  Problem #2: Asthma exacerbation: We'll continue Solu-Medrol daily as well as albuterol nebulizer treatments. Will also continue her inhaled steroids. It's not clear what brought on this severe asthma exacerbation. Will keep her  on droplet precautions until we get the influenza PCR back but given that she had no fever and no myalgias am not sure this was the flu. We'll hold off on starting Tamiflu for this reason. Elevated white count could be stress reaction or it could be an occult infection for example pneumonia which may not have been picked up on initial chest x-ray. We'll get it 2 view chest x-ray and cover her with antibiotics for CAP to be on the safe side for now. Ive started Levaquin because she says "penicillin can kill me". So will stay away from cephalosporins.  Other problems: We'll continue her usual medications for the anxiety, depression, bipolar, chronic pain, and neuropathy. I've also ordered her a nicotine patch due to her tobacco use.   Code Status:  Full Disposition Plan: Admitting to telemetry. I expect this patient will be here anywhere from 2-4 days.  Time spent: 45 minutes  Acey LavWood, Anis Cinelli L [facility] Pager [phone]  If 7PM-7AM, please contact night-coverage [email or web address] Password [email or web address] 03/20/2013, 12:44 AM

## 2013-03-20 NOTE — Progress Notes (Signed)
PATIENT DETAILS Name: Melody Myers Age: 33 y.o. Sex: female Date of Birth: 04/23/1980 Admit Date: 03/19/2013 Admitting Physician Acey LavAllison L Wood, MD PCP:No primary provider on file.  Subjective: Feels better. Her shortness of breath is significantly better.  Assessment/Plan: Active Problems:   DKA (diabetic ketoacidoses) - Continue with insulin infusion till anion gap closes. - On insulin pump-as outpatient, however thinks it has malfunctioned. Apparently uses a 0.8 units per hour as basal regimen. Once anion gap closes, will place on approximately 20 units of Lantus along with pre-meal NovoLog.  Asthma with acute exacerbation - Lungs completely clear on exam. Patient with no shortness of breath and in no distress on exam - Change Solu-Medrol to prednisone, continue with nebulized bronchodilators and empiric Levaquin therapy. - Influenza PCR negative. - Await blood cultures, suspect his clinical improvement continues and cultures continue to be negative, Levaquin could be discontinued in the next few days  Diabetes mellitus - History of insulin-dependent diabetes mellitus, presenting his DKA. Currently on insulin drip. Suspected malfunctioning of insulin pump. We'll transition to basal/bolus regimen with Lantus/NovoLog once anion gap closes. - Patient will have to followup with her primary care practitioner/primary endocrinologist and have her insulin pump evaluated. - Check A1c    Anxiety - Suspect a big component to her presentation. - Continue Xanax    Diabetic neuropathy - Continue Lyrica    Leukocytosis - On empiric Levaquin therapy, could have underlying acute bronchitis with asthma exacerbation. - Leukocytosis has resolved as of 1/24, influenza PCR negative. Continue with empiric Levaquin for now    Chronic pain - Continue with Lyrica and tramadol    Tobacco abuse - Continue transdermal nicotine - Counseled  History of chronic constipation - Start on  MiraLax  Disposition: Remain inpatient  DVT Prophylaxis: Prophylactic Lovenox   Code Status: Full code  Family Communication None at bedside  Procedures:  None  CONSULTS:  None  Time spent 40 minutes-which includes 50% of the time with face-to-face with patient/ family and coordinating care related to the above assessment and plan.    MEDICATIONS: Scheduled Meds: . albuterol  2.5 mg Nebulization Q6H  . ALPRAZolam  0.5-1 mg Oral TID AC & HS  . enoxaparin (LOVENOX) injection  40 mg Subcutaneous Daily  . fluticasone  1 puff Inhalation BID  . [START ON 03/21/2013] influenza vac split quadrivalent PF  0.5 mL Intramuscular Tomorrow-1000  . insulin regular  0-10 Units Intravenous TID WC  . levofloxacin (LEVAQUIN) IV  750 mg Intravenous Daily  . methylPREDNISolone (SOLU-MEDROL) injection  125 mg Intravenous Daily  . nicotine  7 mg Transdermal Daily  . pantoprazole  80 mg Oral Q1200  . [START ON 03/21/2013] pneumococcal 23 valent vaccine  0.5 mL Intramuscular Tomorrow-1000  . polyethylene glycol  17 g Oral Daily  . pregabalin  75 mg Oral BID  . sodium chloride  3 mL Intravenous Q12H   Continuous Infusions: . sodium chloride Stopped (03/20/13 0511)  . dextrose 5 % and 0.45% NaCl 75 mL/hr (03/20/13 0601)  . insulin (NOVOLIN-R) infusion 5.4 Units/hr (03/20/13 0914)   PRN Meds:.albuterol, dextrose, ondansetron, traMADol  Antibiotics: Anti-infectives   Start     Dose/Rate Route Frequency Ordered Stop   03/20/13 0800  levofloxacin (LEVAQUIN) IVPB 750 mg     750 mg 100 mL/hr over 90 Minutes Intravenous Daily 03/20/13 0641 03/25/13 0959       PHYSICAL EXAM: Vital signs in last 24 hours: Filed Vitals:   03/20/13 [id number] 03/20/13 [id number] 03/20/13 [id number]  03/20/13 0901  BP: 102/49 96/55    Pulse: 102 109    Temp:  98.2 F (36.8 C)    TempSrc:  Oral    Resp: 18 18    Height:      Weight:      SpO2: 98% 98% 97% 98%    Weight change:  Filed Weights   03/19/13 2149  Weight:  56.246 kg (124 lb)   Body mass index is 21.27 kg/(m^2).   Gen Exam: Awake and alert, appears anxious.  Neck: Supple, No JVD.   Chest: B/L Clear.  No rhonchi heard. CVS: S1 S2 Regular, no murmurs.  Abdomen: soft, BS +, non tender, non distended.  Extremities: no edema, lower extremities warm to touch. Neurologic: Non Focal.   Skin: No Rash.   Wounds: N/A.    Intake/Output from previous day: No intake or output data in the 24 hours ending 03/20/13 1122   LAB RESULTS: CBC  Recent Labs Lab 03/19/13 2210 03/19/13 2249 03/20/13 0827  WBC 16.6*  --  6.9  HGB 14.5 15.6* 12.2  HCT 42.3 46.0 35.3*  PLT 180  --  192  MCV 85.5  --  83.8  MCH 29.3  --  29.0  MCHC 34.3  --  34.6  RDW 13.3  --  13.0    Chemistries   Recent Labs Lab 03/19/13 2210 03/19/13 2249 03/20/13 0827  NA 128* 130* 136*  K 4.6 4.5 3.7  CL 90* 96 103  CO2 17*  --  18*  GLUCOSE 541* 552* 268*  BUN 17 19 11   CREATININE 0.77 0.80 0.54  CALCIUM 9.5  --  8.7    CBG:  Recent Labs Lab 03/20/13 0554 03/20/13 0653 03/20/13 0755 03/20/13 0912 03/20/13 1018  GLUCAP 251* 231* 248* 241* 243*    GFR Estimated Creatinine Clearance: 88.8 ml/min (by C-G formula based on Cr of 0.54).  Coagulation profile No results found for this basename: INR, PROTIME,  in the last 168 hours  Cardiac Enzymes No results found for this basename: CK, CKMB, TROPONINI, MYOGLOBIN,  in the last 168 hours  No components found with this basename: POCBNP,  No results found for this basename: DDIMER,  in the last 72 hours No results found for this basename: HGBA1C,  in the last 72 hours No results found for this basename: CHOL, HDL, LDLCALC, TRIG, CHOLHDL, LDLDIRECT,  in the last 72 hours No results found for this basename: TSH, T4TOTAL, FREET3, T3FREE, THYROIDAB,  in the last 72 hours No results found for this basename: VITAMINB12, FOLATE, FERRITIN, TIBC, IRON, RETICCTPCT,  in the last 72 hours  Recent Labs  03/19/13 2210   LIPASE 19    Urine Studies No results found for this basename: UACOL, UAPR, USPG, UPH, UTP, UGL, UKET, UBIL, UHGB, UNIT, UROB, ULEU, UEPI, UWBC, URBC, UBAC, CAST, CRYS, UCOM, BILUA,  in the last 72 hours  MICROBIOLOGY: No results found for this or any previous visit (from the past 240 hour(s)).  RADIOLOGY STUDIES/RESULTS: Dg Chest Portable 1 View  03/19/2013   CLINICAL DATA:  Shortness of breath.  Wheezing and cough  EXAM: PORTABLE CHEST - 1 VIEW  COMPARISON:  01/28/2013  FINDINGS: The heart size and mediastinal contours are within normal limits. Both lungs are clear. The visualized skeletal structures are unremarkable.  IMPRESSION: No active disease.   Electronically Signed   By: Signa Kell M.D.   On: 03/19/2013 22:35    Jeoffrey Massed, MD  [facility] Pager:[phone]  If 7PM-7AM, please contact night-coverage [email or web address] Password [email or web address] 03/20/2013, 11:22 AM   LOS: 1 day

## 2013-03-21 DIAGNOSIS — F3130 Bipolar disorder, current episode depressed, mild or moderate severity, unspecified: Secondary | ICD-10-CM

## 2013-03-21 DIAGNOSIS — J45909 Unspecified asthma, uncomplicated: Secondary | ICD-10-CM

## 2013-03-21 DIAGNOSIS — F3289 Other specified depressive episodes: Secondary | ICD-10-CM

## 2013-03-21 DIAGNOSIS — F329 Major depressive disorder, single episode, unspecified: Secondary | ICD-10-CM

## 2013-03-21 LAB — BASIC METABOLIC PANEL
BUN: 6 mg/dL (ref 6–23)
CALCIUM: 8.6 mg/dL (ref 8.4–10.5)
CO2: 22 mEq/L (ref 19–32)
CREATININE: 0.63 mg/dL (ref 0.50–1.10)
Chloride: 107 mEq/L (ref 96–112)
Glucose, Bld: 111 mg/dL — ABNORMAL HIGH (ref 70–99)
Potassium: 3.7 mEq/L (ref 3.7–5.3)
SODIUM: 142 meq/L (ref 137–147)

## 2013-03-21 LAB — CBC
HCT: 33.8 % — ABNORMAL LOW (ref 36.0–46.0)
Hemoglobin: 11.7 g/dL — ABNORMAL LOW (ref 12.0–15.0)
MCH: 29.2 pg (ref 26.0–34.0)
MCHC: 34.6 g/dL (ref 30.0–36.0)
MCV: 84.3 fL (ref 78.0–100.0)
PLATELETS: 230 10*3/uL (ref 150–400)
RBC: 4.01 MIL/uL (ref 3.87–5.11)
RDW: 13.1 % (ref 11.5–15.5)
WBC: 18 10*3/uL — ABNORMAL HIGH (ref 4.0–10.5)

## 2013-03-21 LAB — GLUCOSE, CAPILLARY
GLUCOSE-CAPILLARY: 319 mg/dL — AB (ref 70–99)
GLUCOSE-CAPILLARY: 233 mg/dL — AB (ref 70–99)
Glucose-Capillary: 114 mg/dL — ABNORMAL HIGH (ref 70–99)
Glucose-Capillary: 463 mg/dL — ABNORMAL HIGH (ref 70–99)

## 2013-03-21 LAB — LEGIONELLA ANTIGEN, URINE: Legionella Antigen, Urine: NEGATIVE

## 2013-03-21 MED ORDER — INSULIN ASPART 100 UNIT/ML ~~LOC~~ SOLN: 8.0000 [IU] | Freq: Three times a day (TID) | SUBCUTANEOUS | Status: DC

## 2013-03-21 MED ORDER — INSULIN GLARGINE 100 UNIT/ML ~~LOC~~ SOLN: 15.0000 [IU] | Freq: Every day | SUBCUTANEOUS | Status: DC

## 2013-03-21 MED ORDER — INSULIN ASPART 100 UNIT/ML ~~LOC~~ SOLN
0.0000 [IU] | Freq: Three times a day (TID) | SUBCUTANEOUS | Status: DC
  Administered 2013-03-21: 11 [IU] via SUBCUTANEOUS
  Administered 2013-03-21: 15 [IU] via SUBCUTANEOUS

## 2013-03-21 MED ORDER — LEVOFLOXACIN 500 MG PO TABS: 500.0000 mg | ORAL_TABLET | Freq: Every day | ORAL | Status: DC

## 2013-03-21 MED ORDER — INSULIN GLARGINE 100 UNIT/ML ~~LOC~~ SOLN
10.0000 [IU] | Freq: Every day | SUBCUTANEOUS | Status: DC
  Administered 2013-03-21: 10 [IU] via SUBCUTANEOUS
  Filled 2013-03-21 (×2): qty 0.1

## 2013-03-21 MED ORDER — LEVOFLOXACIN 500 MG PO TABS
500.0000 mg | ORAL_TABLET | Freq: Every day | ORAL | Status: DC
  Administered 2013-03-21: 500 mg via ORAL
  Filled 2013-03-21: qty 1

## 2013-03-21 MED ORDER — INSULIN GLARGINE 100 UNIT/ML ~~LOC~~ SOLN: 20.0000 [IU] | Freq: Every day | SUBCUTANEOUS | Status: DC

## 2013-03-21 MED ORDER — INSULIN ASPART 100 UNIT/ML ~~LOC~~ SOLN: 0.0000 [IU] | Freq: Every day | SUBCUTANEOUS | Status: DC

## 2013-03-21 MED ORDER — PREDNISONE 20 MG PO TABS: 20.0000 mg | ORAL_TABLET | Freq: Every day | ORAL | Status: DC

## 2013-03-21 MED ORDER — INSULIN GLARGINE 100 UNIT/ML ~~LOC~~ SOLN
15.0000 [IU] | Freq: Every day | SUBCUTANEOUS | Status: DC
  Filled 2013-03-21: qty 0.15

## 2013-03-22 LAB — URINE CULTURE
Colony Count: NO GROWTH
Culture: NO GROWTH
Special Requests: NORMAL

## 2013-03-22 NOTE — Progress Notes (Signed)
UR completed. Issis Lindseth RN CCM Case Mgmt 

## 2013-03-24 NOTE — Discharge Summary (Signed)
Physician Discharge Summary  Farrel Connersracy Kennedy ZOX:[id number] DOB: 08/09/1980 DOA: 03/19/2013  PCP: No primary provider on file.  Admit date: 03/19/2013 Discharge date: 03/21/2013  Time spent:8745minutes  Recommendations for Outpatient Follow-up:  1. Endocrinologist in 2-3days  Discharge Diagnoses:  Active Problems:   DKA (diabetic ketoacidoses)   Asthma   Anxiety   Depression   Diabetic neuropathy   Insulin dependent diabetes mellitus with complications   Leukocytosis   Bipolar depression   Chronic pain   Tobacco abuse   Discharge Condition: improved  Diet recommendation: carb modified  Filed Weights   03/19/13 2149  Weight: 56.246 kg (124 lb)    History of present illness:  This is a pleasant 33 year old Caucasian female who unfortunately has a long-standing history of insulin-dependent diabetes mellitus as well as asthma. She also has a history of anxiety, depression, bipolar, chronic pain, neuropathy secondary to diabetes and tobacco abuse.  She was in her usual state of health until 2 weeks ago when she began feeling slightly more weak than usual. One week ago she began having increased cough that progressed throughout this past week. The last few days she's had coughing at night which wakes her up. She's been using her albuterol inhaler multiple times during the day in the last couple of days it doesn't seem to help. She doesn't recall any myalgias or fevers during this week. The past 3 days she felt that her anxiety was worse than usual. Her old insulin pump had not been working correctly and so one week ago she got a new one. It was working well until the day before she came to the hospital and her sugars had been in the 100s. On Thursday they were in the 200s and when she came to the hospital on Friday her sugar was found to be 540. In the emergency department she was found to be in DKA with an anion gap of 20 and was started on insulin drip.   Hospital Course:  DKA (diabetic  ketoacidoses)  - was on Insulin infusion initially till anion gap closed earlier this am, started on lantus and novolog for meal coverage   - On insulin pump-as outpatient, however concern that it has malfunctioned.  -she was adamant to e discharged today before her insulin regimen could be titrated further. She will be discharged on Lantus and Novolog till she can fu with her Endocrinologist   Asthma with acute exacerbation  - improved with steroids, nebs, levaquin -discharged home on prednisone taper and levaquin  Anxiety  - Suspect a big component to her presentation.  - Continue Xanax   Diabetic neuropathy  - Continue Lyrica   Chronic pain  - Continue with Lyrica and tramadol   Tobacco abuse  - Continue transdermal nicotine  - Counseled   History of chronic constipation  - Started on MiraLax      Discharge Exam: Filed Vitals:   03/21/13 1347  BP: 108/67  Pulse: 94  Temp: 98.3 F (36.8 C)  Resp: 18    General: AAOx3 Cardiovascular: S1S2/RRR Respiratory: CTAB  Discharge Instructions  Discharge Orders   Future Orders Complete By Expires   Diet Carb Modified  As directed    Increase activity slowly  As directed        Medication List         albuterol 108 (90 BASE) MCG/ACT inhaler  Commonly known as:  PROVENTIL HFA;VENTOLIN HFA  Inhale 2 puffs into the lungs every 6 (six) hours as needed. For shortness  of breath     ALPRAZolam 1 MG tablet  Commonly known as:  XANAX  Take 0.5-1 mg by mouth 4 (four) times daily.     beclomethasone 80 MCG/ACT inhaler  Commonly known as:  QVAR  Inhale 1 puff into the lungs 2 (two) times daily.     esomeprazole 40 MG capsule  Commonly known as:  NEXIUM  Take 40 mg by mouth daily at 12 noon.     insulin aspart 100 UNIT/ML injection  Commonly known as:  novoLOG  Inject 8 Units into the skin 3 (three) times daily with meals.     insulin glargine 100 UNIT/ML injection  Commonly known as:  LANTUS  Inject 0.2 mLs (20  Units total) into the skin at bedtime.     levofloxacin 500 MG tablet  Commonly known as:  LEVAQUIN  Take 1 tablet (500 mg total) by mouth daily. For 3 days     multivitamin with minerals Tabs tablet  Take 1 tablet by mouth daily.     ondansetron 4 MG tablet  Commonly known as:  ZOFRAN  Take 4 mg by mouth every 8 (eight) hours as needed for nausea or vomiting.     predniSONE 20 MG tablet  Commonly known as:  DELTASONE  Take 1 tablet (20 mg total) by mouth daily with breakfast. Take 1 tab for 2 days then 10mg (half tab) for 1 day then STOP     pregabalin 75 MG capsule  Commonly known as:  LYRICA  Take 75 mg by mouth 2 (two) times daily.     traMADol 50 MG tablet  Commonly known as:  ULTRAM  Take 50 mg by mouth every 6 (six) hours as needed.       Allergies  Allergen Reactions  . Amoxicillin Itching  . Latex Hives  . Penicillins Itching       Follow-up Information   Follow up with Endocrinologist . Schedule an appointment as soon as possible for a visit in 1 week.       The results of significant diagnostics from this hospitalization (including imaging, microbiology, ancillary and laboratory) are listed below for reference.    Significant Diagnostic Studies: Dg Chest Portable 1 View  03/19/2013   CLINICAL DATA:  Shortness of breath.  Wheezing and cough  EXAM: PORTABLE CHEST - 1 VIEW  COMPARISON:  01/28/2013  FINDINGS: The heart size and mediastinal contours are within normal limits. Both lungs are clear. The visualized skeletal structures are unremarkable.  IMPRESSION: No active disease.   Electronically Signed   By: Signa Kell M.D.   On: 03/19/2013 22:35    Microbiology: Recent Results (from the past 240 hour(s))  CULTURE, BLOOD (ROUTINE X 2)     Status: None   Collection Time    03/20/13  8:27 AM      Result Value Range Status   Specimen Description BLOOD LEFT HAND   Final   Special Requests     Final   Value: BOTTLES DRAWN AEROBIC AND ANAEROBIC RED 5CC,BLUE  10CC   Culture  Setup Time     Final   Value: 03/20/2013 14:42     Performed at [facility]   Culture     Final   Value:        BLOOD CULTURE RECEIVED NO GROWTH TO DATE CULTURE WILL BE HELD FOR 5 DAYS BEFORE ISSUING A FINAL NEGATIVE REPORT     Performed at [facility]   Report Status PENDING  Incomplete  CULTURE, BLOOD (ROUTINE X 2)     Status: None   Collection Time    03/20/13  8:32 AM      Result Value Range Status   Specimen Description BLOOD LEFT ANTECUBITAL   Final   Special Requests BOTTLES DRAWN AEROBIC AND ANAEROBIC 10CC   Final   Culture  Setup Time     Final   Value: 03/20/2013 14:43     Performed at [facility]   Culture     Final   Value:        BLOOD CULTURE RECEIVED NO GROWTH TO DATE CULTURE WILL BE HELD FOR 5 DAYS BEFORE ISSUING A FINAL NEGATIVE REPORT     Performed at [facility]   Report Status PENDING   Incomplete  URINE CULTURE     Status: None   Collection Time    03/20/13  5:56 PM      Result Value Range Status   Specimen Description URINE, CLEAN CATCH   Final   Special Requests Normal   Final   Culture  Setup Time     Final   Value: 03/21/2013 05:47     Performed at [facility] Count     Final   Value: NO GROWTH     Performed at [facility]   Culture     Final   Value: NO GROWTH     Performed at [facility]   Report Status 03/22/2013 FINAL   Final     Labs: Basic Metabolic Panel:  Recent Labs Lab 03/20/13 0827 03/20/13 1200 03/20/13 1639 03/20/13 2030 03/21/13 0100  NA 136* 138 139 140 142  K 3.7 3.9 3.9 3.9 3.7  CL 103 105 105 106 107  CO2 18* 20 19 24 22   GLUCOSE 268* 271* 249* 192* 111*  BUN 11 9 8 7 6   CREATININE 0.54 0.47* 0.51 0.57 0.63  CALCIUM 8.7 8.7 8.9 8.9 8.6   Liver Function Tests:  Recent Labs Lab 03/19/13 2210  AST 12  ALT 11  ALKPHOS 93  BILITOT 0.5  PROT 7.6  ALBUMIN 4.2    Recent Labs Lab 03/19/13 2210  LIPASE 19   No results  found for this basename: AMMONIA,  in the last 168 hours CBC:  Recent Labs Lab 03/19/13 2210 03/19/13 2249 03/20/13 0827 03/21/13 0100  WBC 16.6*  --  6.9 18.0*  HGB 14.5 15.6* 12.2 11.7*  HCT 42.3 46.0 35.3* 33.8*  MCV 85.5  --  83.8 84.3  PLT 180  --  192 230   Cardiac Enzymes: No results found for this basename: CKTOTAL, CKMB, CKMBINDEX, TROPONINI,  in the last 168 hours BNP: BNP (last 3 results) No results found for this basename: PROBNP,  in the last 8760 hours CBG:  Recent Labs Lab 03/20/13 2338 03/21/13 0044 03/21/13 0922 03/21/13 1201 03/21/13 1550  GLUCAP 150* 114* 463* 319* 233*       Signed:  Connelly [place]  [facility] 03/24/2013, 5:56 PM

## 2013-03-26 LAB — CULTURE, BLOOD (ROUTINE X 2)
CULTURE: NO GROWTH
Culture: NO GROWTH

## 2013-08-04 ENCOUNTER — Emergency Department (HOSPITAL_COMMUNITY): Payer: Medicare Other

## 2013-08-04 ENCOUNTER — Emergency Department (HOSPITAL_COMMUNITY)
Admission: EM | Admit: 2013-08-04 | Discharge: 2013-08-05 | Disposition: A | Discharge status: Home / Self Care | Payer: Medicare Other | Attending: Emergency Medicine | Admitting: Emergency Medicine

## 2013-08-04 ENCOUNTER — Encounter (HOSPITAL_COMMUNITY): Payer: Self-pay | Admitting: Emergency Medicine

## 2013-08-04 DIAGNOSIS — E1149 Type 2 diabetes mellitus with other diabetic neurological complication: Secondary | ICD-10-CM | POA: Insufficient documentation

## 2013-08-04 DIAGNOSIS — Z880 Allergy status to penicillin: Secondary | ICD-10-CM | POA: Insufficient documentation

## 2013-08-04 DIAGNOSIS — F319 Bipolar disorder, unspecified: Secondary | ICD-10-CM | POA: Insufficient documentation

## 2013-08-04 DIAGNOSIS — Z91040 Latex allergy status: Secondary | ICD-10-CM | POA: Insufficient documentation

## 2013-08-04 DIAGNOSIS — Z3202 Encounter for pregnancy test, result negative: Secondary | ICD-10-CM | POA: Insufficient documentation

## 2013-08-04 DIAGNOSIS — E1142 Type 2 diabetes mellitus with diabetic polyneuropathy: Secondary | ICD-10-CM | POA: Insufficient documentation

## 2013-08-04 DIAGNOSIS — R5381 Other malaise: Secondary | ICD-10-CM | POA: Insufficient documentation

## 2013-08-04 DIAGNOSIS — R110 Nausea: Secondary | ICD-10-CM | POA: Insufficient documentation

## 2013-08-04 DIAGNOSIS — R739 Hyperglycemia, unspecified: Secondary | ICD-10-CM

## 2013-08-04 DIAGNOSIS — R55 Syncope and collapse: Secondary | ICD-10-CM | POA: Insufficient documentation

## 2013-08-04 DIAGNOSIS — R5383 Other fatigue: Secondary | ICD-10-CM

## 2013-08-04 DIAGNOSIS — IMO0002 Reserved for concepts with insufficient information to code with codable children: Secondary | ICD-10-CM | POA: Insufficient documentation

## 2013-08-04 DIAGNOSIS — R079 Chest pain, unspecified: Secondary | ICD-10-CM | POA: Insufficient documentation

## 2013-08-04 DIAGNOSIS — F17200 Nicotine dependence, unspecified, uncomplicated: Secondary | ICD-10-CM | POA: Insufficient documentation

## 2013-08-04 DIAGNOSIS — R42 Dizziness and giddiness: Secondary | ICD-10-CM | POA: Insufficient documentation

## 2013-08-04 DIAGNOSIS — Z79899 Other long term (current) drug therapy: Secondary | ICD-10-CM | POA: Insufficient documentation

## 2013-08-04 DIAGNOSIS — F411 Generalized anxiety disorder: Secondary | ICD-10-CM | POA: Insufficient documentation

## 2013-08-04 DIAGNOSIS — Z794 Long term (current) use of insulin: Secondary | ICD-10-CM | POA: Insufficient documentation

## 2013-08-04 DIAGNOSIS — F419 Anxiety disorder, unspecified: Secondary | ICD-10-CM

## 2013-08-04 DIAGNOSIS — J45901 Unspecified asthma with (acute) exacerbation: Secondary | ICD-10-CM | POA: Insufficient documentation

## 2013-08-04 DIAGNOSIS — R509 Fever, unspecified: Secondary | ICD-10-CM | POA: Insufficient documentation

## 2013-08-04 LAB — URINALYSIS, ROUTINE W REFLEX MICROSCOPIC
Bilirubin Urine: NEGATIVE
Glucose, UA: >1000 mg/dL — AB
Hgb urine dipstick: NEGATIVE
Ketones, ur: NEGATIVE mg/dL
LEUKOCYTES UA: NEGATIVE
NITRITE: NEGATIVE
PH: 5.5 (ref 5.0–8.0)
Protein, ur: NEGATIVE mg/dL
Specific Gravity, Urine: 1.017 (ref 1.005–1.030)
UROBILINOGEN UA: 0.2 mg/dL (ref 0.0–1.0)

## 2013-08-04 LAB — BASIC METABOLIC PANEL
BUN: 9 mg/dL (ref 6–23)
CO2: 24 mEq/L (ref 19–32)
Calcium: 8.9 mg/dL (ref 8.4–10.5)
Chloride: 96 mEq/L (ref 96–112)
Creatinine, Ser: 0.7 mg/dL (ref 0.50–1.10)
GFR calc Af Amer: >90 mL/min (ref >90)
GLUCOSE: 341 mg/dL — AB (ref 70–99)
Potassium: 4 mEq/L (ref 3.7–5.3)
SODIUM: 134 meq/L — AB (ref 137–147)

## 2013-08-04 LAB — CBC
HEMATOCRIT: 39.1 % (ref 36.0–46.0)
Hemoglobin: 13 g/dL (ref 12.0–15.0)
MCH: 28.2 pg (ref 26.0–34.0)
MCHC: 33.2 g/dL (ref 30.0–36.0)
MCV: 84.8 fL (ref 78.0–100.0)
Platelets: 167 10*3/uL (ref 150–400)
RBC: 4.61 MIL/uL (ref 3.87–5.11)
RDW: 15.1 % (ref 11.5–15.5)
WBC: 8.3 10*3/uL (ref 4.0–10.5)

## 2013-08-04 LAB — URINE MICROSCOPIC-ADD ON

## 2013-08-04 LAB — I-STAT TROPONIN, ED: Troponin i, poc: 0 ng/mL (ref 0.00–0.08)

## 2013-08-04 LAB — CBG MONITORING, ED: GLUCOSE-CAPILLARY: 318 mg/dL — AB (ref 70–99)

## 2013-08-04 LAB — POC URINE PREG, ED: Preg Test, Ur: NEGATIVE

## 2013-08-04 MED ORDER — INSULIN ASPART 100 UNIT/ML ~~LOC~~ SOLN
10.0000 [IU] | Freq: Once | SUBCUTANEOUS | Status: AC
  Administered 2013-08-04: 10 [IU] via SUBCUTANEOUS
  Filled 2013-08-04: qty 1

## 2013-08-04 MED ORDER — ALBUTEROL SULFATE HFA 108 (90 BASE) MCG/ACT IN AERS
8.0000 | INHALATION_SPRAY | Freq: Once | RESPIRATORY_TRACT | Status: AC
  Administered 2013-08-04: 8 via RESPIRATORY_TRACT
  Filled 2013-08-04: qty 6.7

## 2013-08-04 NOTE — ED Notes (Addendum)
PER EMS: Multiple complaints: pt from home, reports she was cooking in the kitchen today and felt weak and patient cant remember if she passed out or laid down, the next thing she states she remembers is receiving an albuterol neb txt from family. EMS was called by family and upon EMS arrival, pt A&OX4, sitting up right, minor exp wheezing and pt complaining of SOB, RR-24 and anxious, unlabored. CBG was 338 and pt has insulin pump and reports she gave herself insulin today at 3pm but does not remember how much she gave herself.  Pt also reports lower right and left sharp, intermittent, stabbing CP x 1 month, worse with inspiration. O2 sats 99% RA, BP-112/85, HR-96 NSR. Pt A&Ox4. 18g IV LFA, 100cc NaCl from EMS.

## 2013-08-04 NOTE — ED Provider Notes (Signed)
CSN: [id number]     Arrival date & time 08/04/13  2042 History   First MD Initiated Contact with Patient 08/04/13 2116     Chief Complaint  Patient presents with  . Shortness of Breath  . Hyperglycemia  . Chest Pain  . Loss of Consciousness     (Consider location/radiation/quality/duration/timing/severity/associated sxs/prior Treatment) Patient is a 33 y.o. female presenting with general illness. The history is provided by the patient.  Illness Severity:  Severe Onset quality:  Sudden Timing:  Intermittent Progression:  Worsening Chronicity:  Chronic Associated symptoms: chest pain, fatigue, fever, headaches, nausea and shortness of breath   Associated symptoms: no abdominal pain, no congestion, no cough, no diarrhea, no rhinorrhea and no vomiting     33 yo F pw anxiety, LH, cp, sob, syncope. Onset >1 year ago for most of these symptoms. Syncope intermittently past few months.  Symptoms usually begin with feeling very anxious. Develops SOB and chest pain (tightness to central left chest,radiates to left shoulder, not exertional). Then becomes LH. Feels like she is about to pass out and then does. No palpitations.  Chronic BLE edema for years. No change. No h/o DVT/PE. Not on hormones/estrogens. No recent immobilization.  No h/o cardiac dz H/o asthma. States mdi and nebs don't help. She feels better with [place] only.  No urinary or bowel complaints. No abd. No n/v.   Today's episode occurred immediately after getting some "bad news" about a family member on the phone.    Past Medical History  Diagnosis Date  . Insulin dependent diabetes mellitus with complications     insulin dependant  . Asthma   . Anxiety   . Diabetic neuropathy   . 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 Tubal ligation     No family history on file. History  Substance Use Topics  . Smoking status: Current Every Day Smoker -- 0.25 packs/day     Types: Cigarettes  . Smokeless tobacco: Not on file  . Alcohol Use: Yes     Comment: occasional   OB History   Grav Para Term Preterm Abortions TAB SAB Ect Mult Living                 Review of Systems  Constitutional: Positive for fever and fatigue. Negative for chills.  HENT: Negative for congestion and rhinorrhea.   Respiratory: Positive for shortness of breath. Negative for cough.   Cardiovascular: Positive for chest pain. Negative for leg swelling.  Gastrointestinal: Positive for nausea. Negative for vomiting, abdominal pain and diarrhea.  Genitourinary: Negative for dysuria, hematuria, flank pain and difficulty urinating.  Neurological: Positive for syncope, light-headedness and headaches. Negative for dizziness.  All other systems reviewed and are negative.     Allergies  Amoxicillin; Latex; and Penicillins  Home Medications   Prior to Admission medications   Medication Sig Start Date End Date Taking? Authorizing Provider  albuterol (PROVENTIL HFA;VENTOLIN HFA) 108 (90 BASE) MCG/ACT inhaler Inhale 2 puffs into the lungs every 6 (six) hours as needed. For shortness of breath   Yes Historical Provider, MD  ALPRAZolam Prudy Feeler(XANAX) 1 MG tablet Take 0.5-1 mg by mouth 4 (four) times daily.   Yes Historical Provider, MD  beclomethasone (QVAR) 80 MCG/ACT inhaler Inhale 1 puff into the lungs 2 (two) times daily.   Yes Historical Provider, MD  buPROPion (WELLBUTRIN XL) 300 MG 24 hr tablet Take 300 mg by mouth daily.  Yes Historical Provider, MD  esomeprazole (NEXIUM) 40 MG capsule Take 40 mg by mouth daily at 12 noon.   Yes Historical Provider, MD  insulin aspart (NOVOLOG) 100 UNIT/ML injection Inject into the skin continuous.   Yes Historical Provider, MD  ipratropium-albuterol (DUONEB) 0.5-2.5 (3) MG/3ML SOLN Take 3 mLs by nebulization every 4 (four) hours as needed (for shortness of breath).   Yes Historical Provider, MD  lithium carbonate (ESKALITH) 450 MG CR tablet Take 450 mg by  mouth daily.   Yes Historical Provider, MD  Multiple Vitamin (MULTIVITAMIN WITH MINERALS) TABS Take 1 tablet by mouth daily.   Yes Historical Provider, MD  ondansetron (ZOFRAN) 4 MG tablet Take 4 mg by mouth every 8 (eight) hours as needed for nausea or vomiting.   Yes Historical Provider, MD  OVER THE COUNTER MEDICATION Take 1 tablet by mouth daily.   Yes Historical Provider, MD  pregabalin (LYRICA) 200 MG capsule Take 200 mg by mouth 3 (three) times daily.   Yes Historical Provider, MD   BP 117/73  Pulse 91  Temp(Src) 98.4 F (36.9 C) (Oral)  Resp 21  SpO2 100%  LMP 07/21/2013 Physical Exam  Nursing note and vitals reviewed. Constitutional: She is oriented to person, place, and time. She appears well-developed and well-nourished. No distress.  HENT:  Head: Normocephalic and atraumatic.  Eyes: Conjunctivae and EOM are normal. Pupils are equal, round, and reactive to light. Right eye exhibits no discharge. Left eye exhibits no discharge.  Neck: No tracheal deviation present.  Cardiovascular: Normal rate, regular rhythm, normal heart sounds and intact distal pulses.   Pulmonary/Chest: Effort normal. No stridor. No respiratory distress. She has wheezes (mild expiratory wheezing diffusely on forced exhalation only). She has no rales.  Abdominal: Soft. She exhibits no distension. There is no tenderness. There is no guarding.  Musculoskeletal: She exhibits no edema and no tenderness.  Neurological: She is alert and oriented to person, place, and time. No cranial nerve deficit or sensory deficit. Coordination and gait normal. GCS eye subscore is 4. GCS verbal subscore is 5. GCS motor subscore is 6.  Skin: Skin is warm and dry.  Psychiatric: Her mood appears anxious. She is slowed.    ED Course  Procedures (including critical care time) Labs Review Labs Reviewed  BASIC METABOLIC PANEL - Abnormal; Notable for the following:    Sodium 134 (*)    Glucose, Bld 341 (*)    All other components  within normal limits  URINALYSIS, ROUTINE W REFLEX MICROSCOPIC - Abnormal; Notable for the following:    APPearance CLOUDY (*)    Glucose, UA >1000 (*)    All other components within normal limits  URINE MICROSCOPIC-ADD ON - Abnormal; Notable for the following:    Squamous Epithelial / LPF MANY (*)    All other components within normal limits  CBG MONITORING, ED - Abnormal; Notable for the following:    Glucose-Capillary 318 (*)    All other components within normal limits  CBC  I-STAT TROPOININ, ED  POC URINE PREG, ED    Imaging Review Dg Chest 2 View  08/04/2013   CLINICAL DATA:  Shortness of breath. Chest pain. Hyperglycemia. Loss of consciousness.  EXAM: CHEST  2 VIEW  COMPARISON:  03/19/2013  FINDINGS: The heart size and mediastinal contours are within normal limits. Both lungs are clear. The visualized skeletal structures are unremarkable.  IMPRESSION: No active cardiopulmonary disease.   Electronically Signed   By: Burman Nieves M.D.   On:  08/04/2013 22:48     EKG Interpretation None      MDM   Final diagnoses:  None    33 yo F pw likely anxiety. H/o same.  EKG and troponin reassuring. Doubt ACS.  CXR clear. Do not suspect pna, ptx, dissection or other emergent pathology.   Syncope not exertional. Mild wheezing on exam. Given albuterol. O2 sats 100% on room air.  Neuro intact.  Afebrile. Without evidence of sepsis.   Hyperglycemic. Given 10 units SQ insulin. No ketonuria. AG 14. Bicarb 24. Glucose improved to 157.  Feeling better. Sleeping comfortably.   Patient discharged home. Return precautions given. To follow up with pcp. patient in agreement with plan.  Labs and imaging reviewed by myself and considered in medical decision making if ordered. Imaging interpreted by radiology.   Discussed case with Dr. Jeraldine Loots who is in agreement with assessment and plan.     Stevie Kern, MD 08/05/13 [phone]

## 2013-08-04 NOTE — ED Notes (Signed)
Pt moved to bed. Pt monitored by 12-Lead, BP cuff, pulse ox.

## 2013-08-05 LAB — CBG MONITORING, ED: Glucose-Capillary: 157 mg/dL — ABNORMAL HIGH (ref 70–99)

## 2013-08-05 NOTE — Discharge Instructions (Signed)
Chest Pain (Nonspecific) °It is often hard to give a specific diagnosis for the cause of chest pain. There is always a chance that your pain could be related to something serious, such as a heart attack or a blood clot in the lungs. You need to follow up with your caregiver for further evaluation. °CAUSES  °· Heartburn. °· Pneumonia or bronchitis. °· Anxiety or stress. °· Inflammation around your heart (pericarditis) or lung (pleuritis or pleurisy). °· A blood clot in the lung. °· A collapsed lung (pneumothorax). It can develop suddenly on its own (spontaneous pneumothorax) or from injury (trauma) to the chest. °· Shingles infection (herpes zoster virus). °The chest wall is composed of bones, muscles, and cartilage. Any of these can be the source of the pain. °· The bones can be bruised by injury. °· The muscles or cartilage can be strained by coughing or overwork. °· The cartilage can be affected by inflammation and become sore (costochondritis). °DIAGNOSIS  °Lab tests or other studies, such as X-rays, electrocardiography, stress testing, or cardiac imaging, may be needed to find the cause of your pain.  °TREATMENT  °· Treatment depends on what may be causing your chest pain. Treatment may include: °· Acid blockers for heartburn. °· Anti-inflammatory medicine. °· Pain medicine for inflammatory conditions. °· Antibiotics if an infection is present. °· You may be advised to change lifestyle habits. This includes stopping smoking and avoiding alcohol, caffeine, and chocolate. °· You may be advised to keep your head raised (elevated) when sleeping. This reduces the chance of acid going backward from your stomach into your esophagus. °· Most of the time, nonspecific chest pain will improve within 2 to 3 days with rest and mild pain medicine. °HOME CARE INSTRUCTIONS  °· If antibiotics were prescribed, take your antibiotics as directed. Finish them even if you start to feel better. °· For the next few days, avoid physical  activities that bring on chest pain. Continue physical activities as directed. °· Do not smoke. °· Avoid drinking alcohol. °· Only take over-the-counter or prescription medicine for pain, discomfort, or fever as directed by your caregiver. °· Follow your caregiver's suggestions for further testing if your chest pain does not go away. °· Keep any follow-up appointments you made. If you do not go to an appointment, you could develop lasting (chronic) problems with pain. If there is any problem keeping an appointment, you must call to reschedule. °SEEK MEDICAL CARE IF:  °· You think you are having problems from the medicine you are taking. Read your medicine instructions carefully. °· Your chest pain does not go away, even after treatment. °· You develop a rash with blisters on your chest. °SEEK IMMEDIATE MEDICAL CARE IF:  °· You have increased chest pain or pain that spreads to your arm, neck, jaw, back, or abdomen. °· You develop shortness of breath, an increasing cough, or you are coughing up blood. °· You have severe back or abdominal pain, feel nauseous, or vomit. °· You develop severe weakness, fainting, or chills. °· You have a fever. °THIS IS AN EMERGENCY. Do not wait to see if the pain will go away. Get medical help at once. Call your local emergency services (911 in [place] Do not drive yourself to the hospital. °MAKE SURE YOU:  °· Understand these instructions. °· Will watch your condition. °· Will get help right away if you are not doing well or get worse. °Document Released: 11/21/2004 Document Revised: 05/06/2011 Document Reviewed: 09/17/2007 °Exit[employer]® Patient Information ©2014 [employer],   LLC.  Hyperglycemia Hyperglycemia occurs when the glucose (sugar) in your blood is too high. Hyperglycemia can happen for many reasons, but it most often happens to people who do not know they have diabetes or are not managing their diabetes properly.  CAUSES  Whether you have diabetes or not, there are other causes  of hyperglycemia. Hyperglycemia can occur when you have diabetes, but it can also occur in other situations that you might not be as aware of, such as: Diabetes  If you have diabetes and are having problems controlling your blood glucose, hyperglycemia could occur because of some of the following reasons:  Not following your meal plan.  Not taking your diabetes medications or not taking it properly.  Exercising less or doing less activity than you normally do.  Being sick. Pre-diabetes  This cannot be ignored. Before people develop Type 2 diabetes, they almost always have "pre-diabetes." This is when your blood glucose levels are higher than normal, but not yet high enough to be diagnosed as diabetes. Research has shown that some long-term damage to the body, especially the heart and circulatory system, may already be occurring during pre-diabetes. If you take action to manage your blood glucose when you have pre-diabetes, you may delay or prevent Type 2 diabetes from developing. Stress  If you have diabetes, you may be "diet" controlled or on oral medications or insulin to control your diabetes. However, you may find that your blood glucose is higher than usual in the hospital whether you have diabetes or not. This is often referred to as "stress hyperglycemia." Stress can elevate your blood glucose. This happens because of hormones put out by the body during times of stress. If stress has been the cause of your high blood glucose, it can be followed regularly by your caregiver. That way he/she can make sure your hyperglycemia does not continue to get worse or progress to diabetes. Steroids  Steroids are medications that act on the infection fighting system (immune system) to block inflammation or infection. One side effect can be a rise in blood glucose. Most people can produce enough extra insulin to allow for this rise, but for those who cannot, steroids make blood glucose levels go even  higher. It is not unusual for steroid treatments to "uncover" diabetes that is developing. It is not always possible to determine if the hyperglycemia will go away after the steroids are stopped. A special blood test called an A1c is sometimes done to determine if your blood glucose was elevated before the steroids were started. SYMPTOMS  Thirsty.  Frequent urination.  Dry mouth.  Blurred vision.  Tired or fatigue.  Weakness.  Sleepy.  Tingling in feet or leg. DIAGNOSIS  Diagnosis is made by monitoring blood glucose in one or all of the following ways:  A1c test. This is a chemical found in your blood.  Fingerstick blood glucose monitoring.  Laboratory results. TREATMENT  First, knowing the cause of the hyperglycemia is important before the hyperglycemia can be treated. Treatment may include, but is not be limited to:  Education.  Change or adjustment in medications.  Change or adjustment in meal plan.  Treatment for an illness, infection, etc.  More frequent blood glucose monitoring.  Change in exercise plan.  Decreasing or stopping steroids.  Lifestyle changes. HOME CARE INSTRUCTIONS   Test your blood glucose as directed.  Exercise regularly. Your caregiver will give you instructions about exercise. Pre-diabetes or diabetes which comes on with stress is helped by exercising.  Eat wholesome, balanced meals. Eat often and at regular, fixed times. Your caregiver or nutritionist will give you a meal plan to guide your sugar intake.  Being at an ideal weight is important. If needed, losing as little as 10 to 15 pounds may help improve blood glucose levels. SEEK MEDICAL CARE IF:   You have questions about medicine, activity, or diet.  You continue to have symptoms (problems such as increased thirst, urination, or weight gain). SEEK IMMEDIATE MEDICAL CARE IF:   You are vomiting or have diarrhea.  Your breath smells fruity.  You are breathing faster or  slower.  You are very sleepy or incoherent.  You have numbness, tingling, or pain in your feet or hands.  You have chest pain.  Your symptoms get worse even though you have been following your caregiver's orders.  If you have any other questions or concerns. Document Released: 08/07/2000 Document Revised: 05/06/2011 Document Reviewed: 06/10/2011 [facility] Patient Information 2014 [place], [place].  Syncope Syncope is a fainting spell. This means the person loses consciousness and drops to the ground. The person is generally unconscious for less than 5 minutes. The person may have some muscle twitches for up to 15 seconds before waking up and returning to normal. Syncope occurs more often in elderly people, but it can happen to anyone. While most causes of syncope are not dangerous, syncope can be a sign of a serious medical problem. It is important to seek medical care.  CAUSES  Syncope is caused by a sudden decrease in blood flow to the brain. The specific cause is often not determined. Factors that can trigger syncope include:  Taking medicines that lower blood pressure.  Sudden changes in posture, such as standing up suddenly.  Taking more medicine than prescribed.  Standing in one place for too long.  Seizure disorders.  Dehydration and excessive exposure to heat.  Low blood sugar (hypoglycemia).  Straining to have a bowel movement.  Heart disease, irregular heartbeat, or other circulatory problems.  Fear, emotional distress, seeing blood, or severe pain. SYMPTOMS  Right before fainting, you may:  Feel dizzy or lightheaded.  Feel nauseous.  See all white or all black in your field of vision.  Have cold, clammy skin. DIAGNOSIS  Your caregiver will ask about your symptoms, perform a physical exam, and perform electrocardiography (ECG) to record the electrical activity of your heart. Your caregiver may also perform other heart or blood tests to determine the cause of  your syncope. TREATMENT  In most cases, no treatment is needed. Depending on the cause of your syncope, your caregiver may recommend changing or stopping some of your medicines. HOME CARE INSTRUCTIONS  Have someone stay with you until you feel stable.  Do not drive, operate machinery, or play sports until your caregiver says it is okay.  Keep all follow-up appointments as directed by your caregiver.  Lie down right away if you start feeling like you might faint. Breathe deeply and steadily. Wait until all the symptoms have passed.  Drink enough fluids to keep your urine clear or pale yellow.  If you are taking blood pressure or heart medicine, get up slowly, taking several minutes to sit and then stand. This can reduce dizziness. SEEK IMMEDIATE MEDICAL CARE IF:   You have a severe headache.  You have unusual pain in the chest, abdomen, or back.  You are bleeding from the mouth or rectum, or you have black or tarry stool.  You have an irregular or very  fast heartbeat.  You have pain with breathing.  You have repeated fainting or seizure-like jerking during an episode.  You faint when sitting or lying down.  You have confusion.  You have difficulty walking.  You have severe weakness.  You have vision problems. If you fainted, call your local emergency services (911 in [place] Do not drive yourself to the hospital.  MAKE SURE YOU:  Understand these instructions.  Will watch your condition.  Will get help right away if you are not doing well or get worse. Document Released: 02/11/2005 Document Revised: 08/13/2011 Document Reviewed: 04/12/2011 [facility] Patient Information 2014 [place], [place].  Panic Attacks Panic attacks are sudden, short-livedsurges of severe anxiety, fear, or discomfort. They may occur for no reason when you are relaxed, when you are anxious, or when you are sleeping. Panic attacks may occur for a number of reasons:   Healthy people occasionally  have panic attacks in extreme, life-threatening situations, such as war or natural disasters. Normal anxiety is a protective mechanism of the body that helps us react to danger (fight or flight response).  Panic attacks are often seen with anxiety disorders, such as panic disorder, social anxiety disorder, generalized anxiety disorder, and phobias. Anxiety disorders cause excessive or uncontrollable anxiety. They may interfere with your relationships or other life activities.  Panic attacks are sometimes seen with other mental illnesses such as depression and posttraumatic stress disorder.  Certain medical conditions, prescription medicines, and drugs of abuse can cause panic attacks. SYMPTOMS  Panic attacks start suddenly, peak within 20 minutes, and are accompanied by four or more of the following symptoms:  Pounding heart or fast heart rate (palpitations).  Sweating.  Trembling or shaking.  Shortness of breath or feeling smothered.  Feeling choked.  Chest pain or discomfort.  Nausea or strange feeling in your stomach.  Dizziness, lightheadedness, or feeling like you will faint.  Chills or hot flushes.  Numbness or tingling in your lips or hands and feet.  Feeling that things are not real or feeling that you are not yourself.  Fear of losing control or going crazy.  Fear of dying. Some of these symptoms can mimic serious medical conditions. For example, you may think you are having a heart attack. Although panic attacks can be very scary, they are not life threatening. DIAGNOSIS  Panic attacks are diagnosed through an assessment by your health care provider. Your health care provider will ask questions about your symptoms, such as where and when they occurred. Your health care provider will also ask about your medical history and use of alcohol and drugs, including prescription medicines. Your health care provider may order blood tests or other studies to rule out a serious  medical condition. Your health care provider may refer you to a mental health professional for further evaluation. TREATMENT   Most healthy people who have one or two panic attacks in an extreme, life-threatening situation will not require treatment.  The treatment for panic attacks associated with anxiety disorders or other mental illness typically involves counseling with a mental health professional, medicine, or a combination of both. Your health care provider will help determine what treatment is best for you.  Panic attacks due to physical illness usually goes away with treatment of the illness. If prescription medicine is causing panic attacks, talk with your health care provider about stopping the medicine, decreasing the dose, or substituting another medicine.  Panic attacks due to alcohol or drug abuse goes away with abstinence. Some adults need  professional help in order to stop drinking or using drugs. HOME CARE INSTRUCTIONS   Take all your medicines as prescribed.   Check with your health care provider before starting new prescription or over-the-counter medicines.  Keep all follow up appointments with your health care provider. SEEK MEDICAL CARE IF:  You are not able to take your medicines as prescribed.  Your symptoms do not improve or get worse. SEEK IMMEDIATE MEDICAL CARE IF:   You experience panic attack symptoms that are different than your usual symptoms.  You have serious thoughts about hurting yourself or others.  You are taking medicine for panic attacks and have a serious side effect. MAKE SURE YOU:  Understand these instructions.  Will watch your condition.  Will get help right away if you are not doing well or get worse. Document Released: 02/11/2005 Document Revised: 12/02/2012 Document Reviewed: 09/25/2012 [facility] Patient Information 2014 [place], [place].

## 2013-08-07 NOTE — ED Provider Notes (Signed)
This patient was seen in conjunction with Dr. Roxy Cedar (the resident physician). The documentation accurately reflects the patients ED evaluation and management with the following clarifications.  On my exam the patient was uncomfortable appearing, but AOx3, HD and NV unremarkable.  With reassuring eval here, she was d/c in stable condition.   Gerhard Munch, MD 08/07/13 0003

## 2013-09-04 ENCOUNTER — Emergency Department (HOSPITAL_COMMUNITY)
Admission: EM | Admit: 2013-09-04 | Discharge: 2013-09-04 | Disposition: A | Discharge status: Home / Self Care | Payer: Medicare Other | Attending: Emergency Medicine | Admitting: Emergency Medicine

## 2013-09-04 DIAGNOSIS — Z79899 Other long term (current) drug therapy: Secondary | ICD-10-CM | POA: Insufficient documentation

## 2013-09-04 DIAGNOSIS — J4530 Mild persistent asthma, uncomplicated: Secondary | ICD-10-CM

## 2013-09-04 DIAGNOSIS — F329 Major depressive disorder, single episode, unspecified: Secondary | ICD-10-CM | POA: Insufficient documentation

## 2013-09-04 DIAGNOSIS — F3289 Other specified depressive episodes: Secondary | ICD-10-CM | POA: Insufficient documentation

## 2013-09-04 DIAGNOSIS — Z3202 Encounter for pregnancy test, result negative: Secondary | ICD-10-CM | POA: Diagnosis not present

## 2013-09-04 DIAGNOSIS — Z880 Allergy status to penicillin: Secondary | ICD-10-CM | POA: Insufficient documentation

## 2013-09-04 DIAGNOSIS — E1149 Type 2 diabetes mellitus with other diabetic neurological complication: Secondary | ICD-10-CM | POA: Diagnosis not present

## 2013-09-04 DIAGNOSIS — F17200 Nicotine dependence, unspecified, uncomplicated: Secondary | ICD-10-CM | POA: Insufficient documentation

## 2013-09-04 DIAGNOSIS — F411 Generalized anxiety disorder: Secondary | ICD-10-CM | POA: Diagnosis present

## 2013-09-04 DIAGNOSIS — Z794 Long term (current) use of insulin: Secondary | ICD-10-CM | POA: Diagnosis not present

## 2013-09-04 DIAGNOSIS — E1140 Type 2 diabetes mellitus with diabetic neuropathy, unspecified: Secondary | ICD-10-CM

## 2013-09-04 DIAGNOSIS — J45909 Unspecified asthma, uncomplicated: Secondary | ICD-10-CM | POA: Diagnosis not present

## 2013-09-04 DIAGNOSIS — E1142 Type 2 diabetes mellitus with diabetic polyneuropathy: Secondary | ICD-10-CM | POA: Insufficient documentation

## 2013-09-04 LAB — BASIC METABOLIC PANEL
Anion gap: 12 (ref 5–15)
BUN: 9 mg/dL (ref 6–23)
CALCIUM: 9.2 mg/dL (ref 8.4–10.5)
CO2: 25 meq/L (ref 19–32)
CREATININE: 0.76 mg/dL (ref 0.50–1.10)
Chloride: 100 mEq/L (ref 96–112)
GFR calc Af Amer: >90 mL/min (ref >90)
GFR calc non Af Amer: >90 mL/min (ref >90)
GLUCOSE: 267 mg/dL — AB (ref 70–99)
Potassium: 4.4 mEq/L (ref 3.7–5.3)
Sodium: 137 mEq/L (ref 137–147)

## 2013-09-04 LAB — POC URINE PREG, ED: PREG TEST UR: NEGATIVE

## 2013-09-04 LAB — CBC
HEMATOCRIT: 40.1 % (ref 36.0–46.0)
HEMOGLOBIN: 13.5 g/dL (ref 12.0–15.0)
MCH: 28.7 pg (ref 26.0–34.0)
MCHC: 33.7 g/dL (ref 30.0–36.0)
MCV: 85.3 fL (ref 78.0–100.0)
Platelets: 193 10*3/uL (ref 150–400)
RBC: 4.7 MIL/uL (ref 3.87–5.11)
RDW: 14.5 % (ref 11.5–15.5)
WBC: 7.6 10*3/uL (ref 4.0–10.5)

## 2013-09-04 LAB — I-STAT TROPONIN, ED: Troponin i, poc: 0 ng/mL (ref 0.00–0.08)

## 2013-09-04 MED ORDER — ALBUTEROL SULFATE (2.5 MG/3ML) 0.083% IN NEBU
5.0000 mg | INHALATION_SOLUTION | Freq: Once | RESPIRATORY_TRACT | Status: AC
  Administered 2013-09-04: 5 mg via RESPIRATORY_TRACT
  Filled 2013-09-04: qty 6

## 2013-09-04 NOTE — ED Notes (Signed)
Anxiety for 30 minutes. X 1 today. Has 1 or 2 a day for a month.

## 2013-09-04 NOTE — Discharge Instructions (Signed)

## 2013-09-04 NOTE — ED Notes (Addendum)
Pt c/o intermittent anxiety attacks, start off as chest pain and then she can't catch her breath. Pt states she gets oxygen and she feels better and then it comes back. Pt reports she went to her doctor for her diabetes and asked them to be referred to a pulmonary doctor, and was told she needs to go to her pcp. Pt reports she also has asthma. She believes heat and stress triggers her attacks. Pt reports losing 10lbs in 3wks and having syncopal episodes.

## 2013-09-04 NOTE — ED Provider Notes (Signed)
CSN: [id number]     Arrival date & time 09/04/13  0028 History   First MD Initiated Contact with Patient 09/04/13 0053     Chief Complaint  Patient presents with  . Anxiety     (Consider location/radiation/quality/duration/timing/severity/associated sxs/prior Treatment) HPI This patient is a 33 year old woman with diabetes, diabetic neuropathy, anxiety and asthma. She presents with complaints of anxiety. She says she is having a panic attack. She has a history of frequent panic attacks. She says that 02 via nasal canula is the only thing that helps with her panic attacks. She wants a prescription for home oxygen.   She says she thinks she is having an asthma attack. No cough. NO fever. NO chest pain. Has not used Albuterol at home pta. Called 911, received 02 and Albuterol en route and now feels better.   Past Medical History  Diagnosis Date  . Insulin dependent diabetes mellitus with complications     insulin dependant  . Asthma   . Anxiety   . Diabetic neuropathy   . 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 Tubal ligation     No family history on file. History  Substance Use Topics  . Smoking status: Current Every Day Smoker -- 0.25 packs/day    Types: Cigarettes  . Smokeless tobacco: Not on file  . Alcohol Use: Yes     Comment: occasional   OB History   Grav Para Term Preterm Abortions TAB SAB Ect Mult Living                 Review of Systems  Ten point review of symptoms performed and is negative with the exception of symptoms noted above.   Allergies  Amoxicillin; Latex; Penicillins; and Prednisone  Home Medications   Prior to Admission medications   Medication Sig Start Date End Date Taking? Authorizing Provider  albuterol (PROVENTIL HFA;VENTOLIN HFA) 108 (90 BASE) MCG/ACT inhaler Inhale 2 puffs into the lungs every 6 (six) hours as needed. For shortness of breath   Yes Historical Provider, MD  ALPRAZolam  Prudy Feeler) 1 MG tablet Take 1 mg by mouth 4 (four) times daily.    Yes Historical Provider, MD  buPROPion (WELLBUTRIN SR) 150 MG 12 hr tablet Take 150 mg by mouth 3 (three) times daily.   Yes Historical Provider, MD  esomeprazole (NEXIUM) 40 MG capsule Take 40 mg by mouth 2 (two) times daily.    Yes Historical Provider, MD  Insulin Human (INSULIN PUMP) SOLN Inject 0.675-100 each into the skin every hour. novolog .675-100 units per hour   Yes Historical Provider, MD  ipratropium-albuterol (DUONEB) 0.5-2.5 (3) MG/3ML SOLN Take 3 mLs by nebulization 2 (two) times daily.    Yes Historical Provider, MD  lithium carbonate (ESKALITH) 450 MG CR tablet Take 450 mg by mouth daily.   Yes Historical Provider, MD  mometasone [facility]) 220 MCG/INH inhaler Inhale 2 puffs into the lungs 2 (two) times daily.   Yes Historical Provider, MD  montelukast (SINGULAIR) 10 MG tablet Take 10 mg by mouth at bedtime.   Yes Historical Provider, MD  Multiple Vitamin (MULTIVITAMIN WITH MINERALS) TABS Take 1 tablet by mouth daily.   Yes Historical Provider, MD  ondansetron (ZOFRAN) 4 MG tablet Take 4 mg by mouth every 8 (eight) hours as needed for nausea or vomiting.   Yes Historical Provider, MD  pregabalin (LYRICA) 200 MG capsule Take 200 mg by mouth 3 (three) times  daily.   Yes Historical Provider, MD   BP 106/63  Pulse 96  Temp(Src) 98.3 F (36.8 C) (Oral)  Resp 14  SpO2 95%  LMP 08/11/2013 Physical Exam Gen: well developed and well nourished appearing Head: NCAT Eyes: PERL, EOMI Nose: no epistaixis or rhinorrhea Mouth/throat: mucosa is moist and pink Neck: supple, no stridor Lungs: CTA B, no wheezing, rhonchi or rales CV: regular rate and rythm, good distal pulses.  Abd: soft, notender, nondistended Back: no ttp, no cva ttp Skin: warm and dry Ext: no edema, normal to inspection Neuro: CN ii-xii grossly intact, no focal deficits Psyche; normal affect,  calm and cooperative.  ED Course  Procedures (including  critical care time) Labs Review  Results for orders placed during the hospital encounter of 09/04/13 (from the past 24 hour(s))  CBC     Status: None   Collection Time    09/04/13  1:23 AM      Result Value Ref Range   WBC 7.6  4.0 - 10.5 K/uL   RBC 4.70  3.87 - 5.11 MIL/uL   Hemoglobin 13.5  12.0 - 15.0 g/dL   HCT 16.140.1  09.636.0 - 04.546.0 %   MCV 85.3  78.0 - 100.0 fL   MCH 28.7  26.0 - 34.0 pg   MCHC 33.7  30.0 - 36.0 g/dL   RDW 40.914.5  81.111.5 - 91.415.5 %   Platelets 193  150 - 400 K/uL  BASIC METABOLIC PANEL     Status: Abnormal   Collection Time    09/04/13  1:23 AM      Result Value Ref Range   Sodium 137  137 - 147 mEq/L   Potassium 4.4  3.7 - 5.3 mEq/L   Chloride 100  96 - 112 mEq/L   CO2 25  19 - 32 mEq/L   Glucose, Bld 267 (*) 70 - 99 mg/dL   BUN 9  6 - 23 mg/dL   Creatinine, Ser 7.820.76  0.50 - 1.10 mg/dL   Calcium 9.2  8.4 - 95.610.5 mg/dL   GFR calc non Af Amer >90  >90 mL/min   GFR calc Af Amer >90  >90 mL/min   Anion gap 12  5 - 15  I-STAT TROPOININ, ED     Status: None   Collection Time    09/04/13  1:33 AM      Result Value Ref Range   Troponin i, poc 0.00  0.00 - 0.08 ng/mL   Comment 3           POC URINE PREG, ED     Status: None   Collection Time    09/04/13  1:33 AM      Result Value Ref Range   Preg Test, Ur NEGATIVE  NEGATIVE     MDM    Patient is feeling better after an albuterol neb. Her BS are clear. 02 sats are wnl. VS are wnl. Patient is stable for discharge with plan to continue current meds. She has an appt with her PCP at 8am this morning.    Brandt LoosenJulie Darien Mignogna, MD 09/04/13 [phone]

## 2013-10-13 ENCOUNTER — Encounter (HOSPITAL_COMMUNITY): Payer: Self-pay | Admitting: Emergency Medicine

## 2013-10-13 ENCOUNTER — Emergency Department (HOSPITAL_COMMUNITY)
Admission: EM | Admit: 2013-10-13 | Discharge: 2013-10-13 | Disposition: A | Discharge status: Home / Self Care | Payer: Medicare Other | Attending: Emergency Medicine | Admitting: Emergency Medicine

## 2013-10-13 DIAGNOSIS — Z91040 Latex allergy status: Secondary | ICD-10-CM | POA: Diagnosis not present

## 2013-10-13 DIAGNOSIS — R404 Transient alteration of awareness: Secondary | ICD-10-CM | POA: Insufficient documentation

## 2013-10-13 DIAGNOSIS — E1142 Type 2 diabetes mellitus with diabetic polyneuropathy: Secondary | ICD-10-CM | POA: Insufficient documentation

## 2013-10-13 DIAGNOSIS — Z794 Long term (current) use of insulin: Secondary | ICD-10-CM | POA: Diagnosis not present

## 2013-10-13 DIAGNOSIS — IMO0002 Reserved for concepts with insufficient information to code with codable children: Secondary | ICD-10-CM | POA: Insufficient documentation

## 2013-10-13 DIAGNOSIS — E1149 Type 2 diabetes mellitus with other diabetic neurological complication: Secondary | ICD-10-CM | POA: Diagnosis not present

## 2013-10-13 DIAGNOSIS — F17200 Nicotine dependence, unspecified, uncomplicated: Secondary | ICD-10-CM | POA: Insufficient documentation

## 2013-10-13 DIAGNOSIS — R55 Syncope and collapse: Secondary | ICD-10-CM | POA: Diagnosis not present

## 2013-10-13 DIAGNOSIS — Z79899 Other long term (current) drug therapy: Secondary | ICD-10-CM | POA: Insufficient documentation

## 2013-10-13 DIAGNOSIS — Z880 Allergy status to penicillin: Secondary | ICD-10-CM | POA: Diagnosis not present

## 2013-10-13 DIAGNOSIS — F411 Generalized anxiety disorder: Secondary | ICD-10-CM | POA: Diagnosis not present

## 2013-10-13 DIAGNOSIS — F319 Bipolar disorder, unspecified: Secondary | ICD-10-CM | POA: Diagnosis not present

## 2013-10-13 DIAGNOSIS — J45909 Unspecified asthma, uncomplicated: Secondary | ICD-10-CM | POA: Insufficient documentation

## 2013-10-13 LAB — CBG MONITORING, ED
Glucose-Capillary: 49 mg/dL — ABNORMAL LOW (ref 70–99)
Glucose-Capillary: 161 mg/dL — ABNORMAL HIGH (ref 70–99)
Glucose-Capillary: 179 mg/dL — ABNORMAL HIGH (ref 70–99)

## 2013-10-13 LAB — I-STAT CHEM 8, ED
BUN: 5 mg/dL — ABNORMAL LOW (ref 6–23)
Calcium, Ion: 1.15 mmol/L (ref 1.12–1.23)
Chloride: 103 mEq/L (ref 96–112)
Creatinine, Ser: 0.7 mg/dL (ref 0.50–1.10)
Glucose, Bld: 164 mg/dL — ABNORMAL HIGH (ref 70–99)
HCT: 43 % (ref 36.0–46.0)
Hemoglobin: 14.6 g/dL (ref 12.0–15.0)
Potassium: 4.3 mEq/L (ref 3.7–5.3)
Sodium: 137 mEq/L (ref 137–147)
TCO2: 21 mmol/L (ref 0–100)

## 2013-10-13 LAB — CBC
HCT: 40.7 % (ref 36.0–46.0)
Hemoglobin: 13.8 g/dL (ref 12.0–15.0)
MCH: 28.5 pg (ref 26.0–34.0)
MCHC: 33.9 g/dL (ref 30.0–36.0)
MCV: 84.1 fL (ref 78.0–100.0)
Platelets: 209 10*3/uL (ref 150–400)
RBC: 4.84 MIL/uL (ref 3.87–5.11)
RDW: 13.2 % (ref 11.5–15.5)
WBC: 10.2 10*3/uL (ref 4.0–10.5)

## 2013-10-13 NOTE — ED Notes (Signed)
Initial contact-pt A&Ox4 and moving all extremities equally. No slurred speech. Alert and responsive. At 1104, patient gave herself insulin (Novolog) bolus of 9.4 units with basal rate of 0.875 units. Reports LOC but is unsure if she hit her head. Says this is a recurrent situation d/t inability to control DM. Has chronic generalized pain and numbness/tingling in extremities. Takes Lyrica. Took pain medicine this morning-"I think it's hydrocodone." In NAD. Awaiting MD.

## 2013-10-13 NOTE — ED Provider Notes (Signed)
CSN: [id number]     Arrival date & time 10/13/13  1414 History   First MD Initiated Contact with Patient 10/13/13 1522     Chief Complaint  Patient presents with  . Loss of Consciousness     (Consider location/radiation/quality/duration/timing/severity/associated sxs/prior Treatment) HPI  33 year old female with syncope. Happened just before arrival. Patient reports several episodes similar to have today over the past 3 months. She reports very little prodrome. She will feel tired and somewhat short of breath for a few seconds and then will lose consciousness. Significant other at bedside reports that she is on anywhere from a few seconds up to about a minute. No incontinence. No seizure activity has been reported with these events. She denies any significant pain when she has these. No palpitations. He has checked her pulse is his habit to her and it is consistently elevated around 140. Has not tried treating her blood pressure. She has a history of diabetes as an insulin pump. She is noted to be hypoglycemic current some these episodes but has had them when normal glycemic or hyperglycemic as well.  Past Medical History  Diagnosis Date  . Insulin dependent diabetes mellitus with complications     insulin dependant  . Asthma   . Anxiety   . Diabetic neuropathy   . 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 Tubal ligation     No family history on file. History  Substance Use Topics  . Smoking status: Current Every Day Smoker -- 0.25 packs/day    Types: Cigarettes  . Smokeless tobacco: Not on file  . Alcohol Use: Yes     Comment: occasional   OB History   Grav Para Term Preterm Abortions TAB SAB Ect Mult Living                 Review of Systems  All systems reviewed and negative, other than as noted in HPI.   Allergies  Amoxicillin; Latex; Penicillins; and Prednisone  Home Medications   Prior to Admission medications    Medication Sig Start Date End Date Taking? Authorizing Provider  albuterol (PROVENTIL HFA;VENTOLIN HFA) 108 (90 BASE) MCG/ACT inhaler Inhale 2 puffs into the lungs every 6 (six) hours as needed. For shortness of breath   Yes Historical Provider, MD  ALPRAZolam Prudy Feeler(XANAX) 1 MG tablet Take 1 mg by mouth 4 (four) times daily.    Yes Historical Provider, MD  BuPROPion HCl ER, XL, (FORFIVO XL) 450 MG TB24 Take 450 mg by mouth daily.   Yes Historical Provider, MD  esomeprazole (NEXIUM) 40 MG capsule Take 40 mg by mouth 2 (two) times daily.    Yes Historical Provider, MD  HYDROcodone-acetaminophen (NORCO) 7.5-325 MG per tablet Take 1 tablet by mouth every 12 (twelve) hours as needed for moderate pain.   Yes Historical Provider, MD  Insulin Human (INSULIN PUMP) SOLN Inject 0.675-100 each into the skin every hour. novolog .675-100 units per hour   Yes Historical Provider, MD  ipratropium-albuterol (DUONEB) 0.5-2.5 (3) MG/3ML SOLN Take 3 mLs by nebulization 2 (two) times daily.    Yes Historical Provider, MD  lithium carbonate (ESKALITH) 450 MG CR tablet Take 450 mg by mouth daily.   Yes Historical Provider, MD  mometasone [facility](ASMANEX) 220 MCG/INH inhaler Inhale 2 puffs into the lungs 2 (two) times daily.   Yes Historical Provider, MD  montelukast (SINGULAIR) 10 MG tablet Take 10 mg by mouth at bedtime.  Yes Historical Provider, MD  Multiple Vitamin (MULTIVITAMIN WITH MINERALS) TABS Take 1 tablet by mouth daily.   Yes Historical Provider, MD  ondansetron (ZOFRAN) 4 MG tablet Take 4 mg by mouth every 8 (eight) hours as needed for nausea or vomiting.   Yes Historical Provider, MD  pregabalin (LYRICA) 200 MG capsule Take 200 mg by mouth 3 (three) times daily.   Yes Historical Provider, MD   BP 107/80  Pulse 109  Temp(Src) 97.6 F (36.4 C) (Oral)  Resp 22  SpO2 95%  LMP 10/07/2013 Physical Exam  Nursing note and vitals reviewed. Constitutional: She is oriented to person, place, and time. She appears  well-developed and well-nourished. No distress.  Sitting in bed with eyes open. Appears somewhat drowsy, but not toxic.  HENT:  Head: Normocephalic and atraumatic.  Eyes: Conjunctivae are normal. Right eye exhibits no discharge. Left eye exhibits no discharge.  Neck: Neck supple.  Cardiovascular: Normal rate, regular rhythm and normal heart sounds.  Exam reveals no gallop and no friction rub.   No murmur heard. Pulmonary/Chest: Effort normal and breath sounds normal. No respiratory distress.  Abdominal: Soft. She exhibits no distension. There is no tenderness.  Musculoskeletal: She exhibits no edema and no tenderness.  Lower extremities symmetric as compared to each other. No calf tenderness. Negative Homan's. No palpable cords.   Neurological: She is alert and oriented to person, place, and time. No cranial nerve deficit. She exhibits normal muscle tone. Coordination normal.  Skin: Skin is warm and dry.  Psychiatric: She has a normal mood and affect. Her behavior is normal. Thought content normal.    ED Course  Procedures (including critical care time) Labs Review Labs Reviewed  CBG MONITORING, ED - Abnormal; Notable for the following:    Glucose-Capillary 49 (*)    All other components within normal limits  I-STAT CHEM 8, ED - Abnormal; Notable for the following:    BUN 5 (*)    Glucose, Bld 164 (*)    All other components within normal limits  CBG MONITORING, ED - Abnormal; Notable for the following:    Glucose-Capillary 161 (*)    All other components within normal limits  CBG MONITORING, ED - Abnormal; Notable for the following:    Glucose-Capillary 179 (*)    All other components within normal limits  CBC    Imaging Review No results found.   EKG Interpretation None      EKG:  Rhythm: normal sinus Vent. rate 99 BPM PR interval 141 ms QRS duration 96 ms QT/QTc 349/448 ms ST segments:NS ST changes   MDM   Final diagnoses:  Syncope and collapse     33 year old female with recurrent syncope. Etiology is not completely clear. Patient has a history of diabetes and hasn't insulin pump. She reports that she has been hypoglycemic with these episodes previously but she's also had them with blood sugars normal or elevated. She is slightly tachycardic in the emergency room the blood pressure is normal. She does not appear toxic. Her EKG today does not show any particularly concerning changes. Her staff and other reports that her pulse is consistently elevated in the 140 range with these events. She's never taken her blood pressure which he has when he spells. She reports no prior history of hypertension. She's not on any hypertensive medications. Workup today has been pretty unremarkable. She did have a blood sugar 49 when she first arrived. Was 164 on her BNP and when rechecked again stable in the  170s. I feel she is stable for discharge at this time. Discussed with her she is to followup with her PCP and specifically discuss possible Holter monitoring for dysrhythmia as possible etiology. Raeford Razor, MD 10/18/13 [id number]

## 2013-10-13 NOTE — ED Notes (Signed)
She has been short of breath recently and states has hx of asthma.  She states she ran out of her Albuterol yesterday.  She states she has been "real short of breath today" and "passed out".  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  She arrives in no distress; and [facility] paramedics report normal cbg/v.s. En route to hospital.

## 2013-10-13 NOTE — ED Notes (Signed)
Bed: WA20 Expected date:  Expected time:  Means of arrival:  Comments: EMS- syncope 

## 2013-10-13 NOTE — Discharge Instructions (Signed)
Holter Monitoring °A Holter monitor is a small device with electrodes (small sticky patches) that attach to your chest. It records the electrical activity of your heart and is worn continuously for 24-48 hours.  °A HOLTER MONITOR IS USED TO °· Detect heart problems such as: °· Heart arrhythmia. Is an abnormal or irregular heartbeat. With some heart arrhythmias, you may not feel or know that you have an irregular heart rhythm. °· Palpitations, such as feeling your heart racing or fluttering. It is possible to have heart palpitations and not have a heart arrhythmia. °· A heart rhythm that is too slow or too fast. °· If you have problems fainting, near fainting or feeling light-headed, a Holter monitor may be worn to see if your heart is the cause. °HOLTER MONITOR PREPARATION  °· Electrodes will be attached to the skin on your chest. °· If you have hair on your chest, small areas may have to be shaved. This is done to help the patches stick better and make the recording more accurate. °· The electrodes are attached by wires to the Holter monitor. The Holter monitor clips to your clothing. You will wear the monitor at all times, even while exercising and sleeping. °HOME CARE INSTRUCTIONS  °· Wear your monitor at all times. °· The wires and the monitor must stay dry. Do not get the monitor wet. °· Do not bathe, swim or use a hot tub with it on. °· You may do a "sponge" bath while you have the monitor on. °· Keep your skin clean, do not put body lotion or moisturizer on your chest. °· It's possible that your skin under the electrodes could become irritated. To keep this from happening, you may put the electrodes in slightly different places on your chest. °· Your caregiver will also ask you to keep a diary of your activities, such as walking or doing chores. Be sure to note what you are doing if you experience heart symptoms such as palpitations. This will help your caregiver determine what might be contributing to your  symptoms. The information stored in your monitor will be reviewed by your caregiver alongside your diary entries. °· Make sure the monitor is safely clipped to your clothing or in a location close to your body that your caregiver recommends. °· The monitor and electrodes are removed when the test is over. Return the monitor as directed. °· Be sure to follow up with your caregiver and discuss your Holter monitor results. °SEEK IMMEDIATE MEDICAL CARE IF: °· You faint or feel lightheaded. °· You have trouble breathing. °· You get pain in your chest, upper arm or jaw. °· You feel sick to your stomach and your skin is pale, cool, or damp. °· You think something is wrong with the way your heart is beating. °MAKE SURE YOU:  °· Understand these instructions. °· Will watch your condition. °· Will get help right away if you are not doing well or get worse. °Document Released: 11/10/2003 Document Revised: 05/06/2011 Document Reviewed: 03/24/2008 °ExitCare® Patient Information ©2015 [employer]. This information is not intended to replace advice given to you by your health care provider. Make sure you discuss any questions you have with your health care provider. ° °Syncope °Syncope means a person passes out (faints). The person usually wakes up in less than 5 minutes. It is important to seek medical care for syncope. °HOME CARE °· Have someone stay with you until you feel normal. °· Do not drive, use machines, or play   sports until your doctor says it is okay. °· Keep all doctor visits as told. °· Lie down when you feel like you might pass out. Take deep breaths. Wait until you feel normal before standing up. °· Drink enough fluids to keep your pee (urine) clear or pale yellow. °· If you take blood pressure or heart medicine, get up slowly. Take several minutes to sit and then stand. °GET HELP RIGHT AWAY IF:  °· You have a severe headache. °· You have pain in the chest, belly (abdomen), or back. °· You are bleeding from the  mouth or butt (rectum). °· You have black or tarry poop (stool). °· You have an irregular or very fast heartbeat. °· You have pain with breathing. °· You keep passing out, or you have shaking (seizures) when you pass out. °· You pass out when sitting or lying down. °· You feel confused. °· You have trouble walking. °· You have severe weakness. °· You have vision problems. °If you fainted, call your local emergency services (911 in [place] Do not drive yourself to the hospital. °MAKE SURE YOU:  °· Understand these instructions. °· Will watch your condition. °· Will get help right away if you are not doing well or get worse. °Document Released: 07/31/2007 Document Revised: 08/13/2011 Document Reviewed: 04/12/2011 °ExitCare® Patient Information ©2015 [employer]. This information is not intended to replace advice given to you by your health care provider. Make sure you discuss any questions you have with your health care provider. ° °

## 2013-11-24 ENCOUNTER — Emergency Department (HOSPITAL_COMMUNITY)
Admission: EM | Admit: 2013-11-24 | Discharge: 2013-11-25 | Disposition: A | Discharge status: Home / Self Care | Payer: Medicare Other | Attending: Emergency Medicine | Admitting: Emergency Medicine

## 2013-11-24 ENCOUNTER — Emergency Department (HOSPITAL_COMMUNITY): Payer: Medicare Other

## 2013-11-24 ENCOUNTER — Encounter (HOSPITAL_COMMUNITY): Payer: Self-pay | Admitting: Emergency Medicine

## 2013-11-24 DIAGNOSIS — Z79899 Other long term (current) drug therapy: Secondary | ICD-10-CM | POA: Insufficient documentation

## 2013-11-24 DIAGNOSIS — Z91040 Latex allergy status: Secondary | ICD-10-CM | POA: Insufficient documentation

## 2013-11-24 DIAGNOSIS — R079 Chest pain, unspecified: Secondary | ICD-10-CM | POA: Insufficient documentation

## 2013-11-24 DIAGNOSIS — R110 Nausea: Secondary | ICD-10-CM | POA: Insufficient documentation

## 2013-11-24 DIAGNOSIS — Z880 Allergy status to penicillin: Secondary | ICD-10-CM | POA: Insufficient documentation

## 2013-11-24 DIAGNOSIS — R401 Stupor: Secondary | ICD-10-CM | POA: Insufficient documentation

## 2013-11-24 DIAGNOSIS — R61 Generalized hyperhidrosis: Secondary | ICD-10-CM | POA: Diagnosis not present

## 2013-11-24 DIAGNOSIS — E1142 Type 2 diabetes mellitus with diabetic polyneuropathy: Secondary | ICD-10-CM | POA: Diagnosis not present

## 2013-11-24 DIAGNOSIS — J45909 Unspecified asthma, uncomplicated: Secondary | ICD-10-CM | POA: Diagnosis not present

## 2013-11-24 DIAGNOSIS — H539 Unspecified visual disturbance: Secondary | ICD-10-CM | POA: Insufficient documentation

## 2013-11-24 DIAGNOSIS — R55 Syncope and collapse: Secondary | ICD-10-CM

## 2013-11-24 DIAGNOSIS — R42 Dizziness and giddiness: Secondary | ICD-10-CM | POA: Insufficient documentation

## 2013-11-24 DIAGNOSIS — F17210 Nicotine dependence, cigarettes, uncomplicated: Secondary | ICD-10-CM | POA: Insufficient documentation

## 2013-11-24 DIAGNOSIS — Z794 Long term (current) use of insulin: Secondary | ICD-10-CM | POA: Insufficient documentation

## 2013-11-24 DIAGNOSIS — F329 Major depressive disorder, single episode, unspecified: Secondary | ICD-10-CM | POA: Diagnosis not present

## 2013-11-24 DIAGNOSIS — R404 Transient alteration of awareness: Secondary | ICD-10-CM | POA: Diagnosis present

## 2013-11-24 DIAGNOSIS — F411 Generalized anxiety disorder: Secondary | ICD-10-CM | POA: Diagnosis not present

## 2013-11-24 DIAGNOSIS — F17200 Nicotine dependence, unspecified, uncomplicated: Secondary | ICD-10-CM | POA: Diagnosis not present

## 2013-11-24 DIAGNOSIS — F3289 Other specified depressive episodes: Secondary | ICD-10-CM | POA: Diagnosis not present

## 2013-11-24 DIAGNOSIS — E1140 Type 2 diabetes mellitus with diabetic neuropathy, unspecified: Secondary | ICD-10-CM | POA: Insufficient documentation

## 2013-11-24 DIAGNOSIS — E1149 Type 2 diabetes mellitus with other diabetic neurological complication: Secondary | ICD-10-CM | POA: Diagnosis not present

## 2013-11-24 DIAGNOSIS — F419 Anxiety disorder, unspecified: Secondary | ICD-10-CM | POA: Insufficient documentation

## 2013-11-24 LAB — CBC WITH DIFFERENTIAL/PLATELET
BASOS ABS: 0.1 10*3/uL (ref 0.0–0.1)
Basophils Relative: 1 % (ref 0–1)
Eosinophils Absolute: 0.1 10*3/uL (ref 0.0–0.7)
Eosinophils Relative: 2 % (ref 0–5)
HEMATOCRIT: 39.1 % (ref 36.0–46.0)
Hemoglobin: 13.3 g/dL (ref 12.0–15.0)
Lymphocytes Relative: 40 % (ref 12–46)
Lymphs Abs: 2.8 10*3/uL (ref 0.7–4.0)
MCH: 28.5 pg (ref 26.0–34.0)
MCHC: 34 g/dL (ref 30.0–36.0)
MCV: 83.9 fL (ref 78.0–100.0)
Monocytes Absolute: 0.5 10*3/uL (ref 0.1–1.0)
Monocytes Relative: 7 % (ref 3–12)
NEUTROS ABS: 3.6 10*3/uL (ref 1.7–7.7)
Neutrophils Relative %: 50 % (ref 43–77)
Platelets: 196 10*3/uL (ref 150–400)
RBC: 4.66 MIL/uL (ref 3.87–5.11)
RDW: 13.6 % (ref 11.5–15.5)
WBC: 7 10*3/uL (ref 4.0–10.5)

## 2013-11-24 LAB — BASIC METABOLIC PANEL
Anion gap: 16 — ABNORMAL HIGH (ref 5–15)
BUN: 5 mg/dL — ABNORMAL LOW (ref 6–23)
CALCIUM: 8.8 mg/dL (ref 8.4–10.5)
CO2: 23 mEq/L (ref 19–32)
Chloride: 101 mEq/L (ref 96–112)
Creatinine, Ser: 0.75 mg/dL (ref 0.50–1.10)
GLUCOSE: 149 mg/dL — AB (ref 70–99)
Potassium: 3.5 mEq/L — ABNORMAL LOW (ref 3.7–5.3)
SODIUM: 140 meq/L (ref 137–147)

## 2013-11-24 LAB — CBG MONITORING, ED: Glucose-Capillary: 148 mg/dL — ABNORMAL HIGH (ref 70–99)

## 2013-11-24 LAB — I-STAT TROPONIN, ED: TROPONIN I, POC: 0 ng/mL (ref 0.00–0.08)

## 2013-11-24 LAB — HCG, SERUM, QUALITATIVE: Preg, Serum: NEGATIVE

## 2013-11-24 MED ORDER — SODIUM CHLORIDE 0.9 % IV BOLUS (SEPSIS)
1000.0000 mL | Freq: Once | INTRAVENOUS | Status: AC
  Administered 2013-11-24: 1000 mL via INTRAVENOUS

## 2013-11-24 NOTE — ED Notes (Signed)
Pt arrived from home via EMS. Pt had syncopal episode around 7:45pm. Pt c/o of chest pain 7/10 in central chest. Chest pain worsen on palpation and inspiration. Pt has a heart monitor and will go for follow up in October. Pt has hx of chronic pain. Pt has insulin pump. 128/90 99% RA 98 18 CBG 146.

## 2013-11-24 NOTE — ED Provider Notes (Signed)
CSN: [id number]     Arrival date & time 11/24/13  2027 History   First MD Initiated Contact with Patient 11/24/13 2058     Chief Complaint  Patient presents with  . Loss of Consciousness  . Chest Pain     (Consider location/radiation/quality/duration/timing/severity/associated sxs/prior Treatment) Patient is a 33 y.o. female presenting with syncope.  Loss of Consciousness Episode history:  Multiple Most recent episode:  Today Duration:  5 seconds Timing:  Sporadic Progression:  Unchanged Chronicity:  Recurrent Context: standing up   Witnessed: yes   Relieved by:  None tried Worsened by:  Nothing tried Ineffective treatments:  None tried Associated symptoms: chest pain, diaphoresis, nausea and visual change   Associated symptoms: no fever, no headaches, no shortness of breath and no vomiting   Risk factors: no coronary artery disease and no seizures     Past Medical History  Diagnosis Date  . Insulin dependent diabetes mellitus with complications     insulin dependant  . Asthma   . Anxiety   . Diabetic neuropathy   . 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 Tubal ligation     No family history on file. History  Substance Use Topics  . Smoking status: Current Every Day Smoker -- 0.25 packs/day    Types: Cigarettes  . Smokeless tobacco: Not on file  . Alcohol Use: Yes     Comment: occasional   OB History   Grav Para Term Preterm Abortions TAB SAB Ect Mult Living                 Review of Systems  Constitutional: Positive for diaphoresis. Negative for fever and chills.  HENT: Negative for congestion and sore throat.   Eyes: Positive for visual disturbance.  Respiratory: Negative for cough, shortness of breath and wheezing.   Cardiovascular: Positive for chest pain and syncope.  Gastrointestinal: Positive for nausea. Negative for vomiting, abdominal pain, diarrhea and constipation.  Genitourinary: Negative  for dysuria, difficulty urinating and vaginal pain.  Musculoskeletal: Negative for arthralgias and myalgias.  Skin: Negative for rash.  Neurological: Positive for syncope and light-headedness. Negative for headaches.  Psychiatric/Behavioral: Negative for behavioral problems.  All other systems reviewed and are negative.     Allergies  Amoxicillin; Hydrocodone; Latex; Penicillins; and Prednisone  Home Medications   Prior to Admission medications   Medication Sig Start Date End Date Taking? Authorizing Provider  ALPRAZolam Prudy Feeler) 1 MG tablet Take 1 mg by mouth 4 (four) times daily.    Yes Historical Provider, MD  buPROPion (WELLBUTRIN XL) 150 MG 24 hr tablet Take 450 mg by mouth daily.   Yes Historical Provider, MD  dexlansoprazole (DEXILANT) 60 MG capsule Take 60 mg by mouth 2 (two) times daily.   Yes Historical Provider, MD  ibuprofen (ADVIL,MOTRIN) 200 MG tablet Take 800 mg by mouth every 6 (six) hours as needed.   Yes Historical Provider, MD  Insulin Human (INSULIN PUMP) SOLN Inject 0.075 each into the skin every hour. novolog 0.75 units per hour   Yes Historical Provider, MD  Ipratropium-Albuterol (COMBIVENT RESPIMAT) 20-100 MCG/ACT AERS respimat Inhale 1 puff into the lungs every 6 (six) hours.   Yes Historical Provider, MD  ipratropium-albuterol (DUONEB) 0.5-2.5 (3) MG/3ML SOLN Take 3 mLs by nebulization 2 (two) times daily.    Yes Historical Provider, MD  lithium carbonate (ESKALITH) 450 MG CR tablet Take 450 mg by mouth at bedtime.  Yes Historical Provider, MD  mometasone [facility]) 220 MCG/INH inhaler Inhale 2 puffs into the lungs 2 (two) times daily.   Yes Historical Provider, MD  montelukast (SINGULAIR) 10 MG tablet Take 10 mg by mouth at bedtime.   Yes Historical Provider, MD  Multiple Vitamin (MULTIVITAMIN WITH MINERALS) TABS Take 1 tablet by mouth daily.   Yes Historical Provider, MD  ondansetron (ZOFRAN) 4 MG tablet Take 4 mg by mouth every 8 (eight) hours as needed for  nausea or vomiting.   Yes Historical Provider, MD  pregabalin (LYRICA) 200 MG capsule Take 200 mg by mouth 3 (three) times daily.   Yes Historical Provider, MD   BP 120/75  Pulse 91  Temp(Src) 98.1 F (36.7 C) (Oral)  SpO2 98% Physical Exam  Vitals reviewed. Constitutional: She is oriented to person, place, and time. She appears well-developed and well-nourished. No distress.  HENT:  Head: Normocephalic and atraumatic.  Eyes: EOM are normal.  Neck: Normal range of motion.  Cardiovascular: Normal rate, regular rhythm and normal heart sounds.   No murmur heard. Pulmonary/Chest: Effort normal and breath sounds normal. No respiratory distress. She has no wheezes.  Abdominal: Soft. There is no tenderness.  Musculoskeletal: She exhibits no edema.  Neurological: She is alert and oriented to person, place, and time. She has normal strength. No cranial nerve deficit or sensory deficit. She displays a negative Romberg sign. Gait normal. GCS eye subscore is 4. GCS verbal subscore is 5. GCS motor subscore is 6.  Skin: She is not diaphoretic.  Psychiatric: She has a normal mood and affect. Her behavior is normal.    ED Course  Procedures (including critical care time) Labs Review Labs Reviewed  BASIC METABOLIC PANEL - Abnormal; Notable for the following:    Potassium 3.5 (*)    Glucose, Bld 149 (*)    BUN 5 (*)    Anion gap 16 (*)    All other components within normal limits  CBG MONITORING, ED - Abnormal; Notable for the following:    Glucose-Capillary 148 (*)    All other components within normal limits  CBC WITH DIFFERENTIAL  HCG, SERUM, QUALITATIVE  I-STAT TROPOININ, ED    Imaging Review Dg Chest 2 View  11/24/2013   CLINICAL DATA:  Syncope.  EXAM: CHEST  2 VIEW  COMPARISON:  10/15/2013.  FINDINGS: The heart size and mediastinal contours are within normal limits. Both lungs are clear. The visualized skeletal structures are unremarkable.  IMPRESSION: No acute cardiopulmonary  findings.   Electronically Signed   By: Loralie Champagne M.D.   On: 11/24/2013 21:32     EKG Interpretation   Date/Time:  Wednesday November 24 2013 20:45:50 EDT Ventricular Rate:  94 PR Interval:  134 QRS Duration: 98 QT Interval:  360 QTC Calculation: 450 R Axis:   68 Text Interpretation:  Sinus rhythm RSR' in V1 or V2, probably normal  variant No significant change was found Confirmed by CAMPOS  MD, Caryn Bee  ([id number]) on 11/24/2013 11:24:16 PM      MDM   Final diagnoses:  Syncope and collapse    Patient is a 33 year old female with past medical history center for type 1 diabetes, asthma, anxiety, chronic pain that presents with syncope and collapse. Patient states that she's had approximately 20 episodes in the past 2 months and recently was wearing a Holter monitor when one of these episodes occurred however she has not heard results at this time. Patient describes a prodrome prior to syncope stating that  she gets diaphoretic nauseated pale and gets tunnel vision and then has approximately 5-10 seconds of unresponsiveness without any seizure-like activity and there is no post ictal state. Patient denies any traumatic injury following this episode of syncope. On arrival the patient is alert and oriented times. Patient checked her blood sugar immediately following this event and it was 130. Patient has a normal neurologic exam. Patient's EKG shows no Brugada, WPW., or long QT intervals. Patient states she also has chest pain currently however this is chronic and not associated with a syncopal episode. Chest x-ray shows no acute abnormality there are no focal breath sounds noted. Patient satting 98% on room air and nonrespiratory distress. Patient's workup is in above is unremarkable. Given the patient's preceding events this could potentially be orthostatic hypotension versus vasovagal syncope. Patient given IV fluid bolus. Patient is to followup with her PCP and discuss results of Holter  monitor. Patient given return precautions which are voiced understanding to.    Beverely Risenennis Emireth Cockerham, MD 11/25/13 0000

## 2013-11-24 NOTE — Discharge Instructions (Signed)
Vasovagal Syncope, Adult °Syncope, commonly known as fainting, is a temporary loss of consciousness. It occurs when the blood flow to the brain is reduced. Vasovagal syncope (also called neurocardiogenic syncope) is a fainting spell in which the blood flow to the brain is reduced because of a sudden drop in heart rate and blood pressure. Vasovagal syncope occurs when the brain and the cardiovascular system (blood vessels) do not adequately communicate and respond to each other. This is the most common cause of fainting. It often occurs in response to fear or some other type of emotional or physical stress. The body has a reaction in which the heart starts beating too slowly or the blood vessels expand, reducing blood pressure. This type of fainting spell is generally considered harmless. However, injuries can occur if a person takes a sudden fall during a fainting spell.  °CAUSES  °Vasovagal syncope occurs when a person's blood pressure and heart rate decrease suddenly, usually in response to a trigger. Many things and situations can trigger an episode. Some of these include:  °· Pain.   °· Fear.   °· The sight of blood or medical procedures, such as blood being drawn from a vein.   °· Common activities, such as coughing, swallowing, stretching, or going to the bathroom.   °· Emotional stress.   °· Prolonged standing, especially in a warm environment.   °· Lack of sleep or rest.   °· Prolonged lack of food.   °· Prolonged lack of fluids.   °· Recent illness. °· The use of certain drugs that affect blood pressure, such as cocaine, alcohol, marijuana, inhalants, and opiates.   °SYMPTOMS  °Before the fainting episode, you may:  °· Feel dizzy or light headed.   °· Become pale. °· Sense that you are going to faint.   °· Feel like the room is spinning.   °· Have tunnel vision, only seeing directly in front of you.   °· Feel sick to your stomach (nauseous).   °· See spots or slowly lose vision.   °· Hear ringing in your  ears.   °· Have a headache.   °· Feel warm and sweaty.   °· Feel a sensation of pins and needles. °During the fainting spell, you will generally be unconscious for no longer than a couple minutes before waking up and returning to normal. If you get up too quickly before your body can recover, you may faint again. Some twitching or jerky movements may occur during the fainting spell.  °DIAGNOSIS  °Your caregiver will ask about your symptoms, take a medical history, and perform a physical exam. Various tests may be done to rule out other causes of fainting. These may include blood tests and tests to check the heart, such as electrocardiography, echocardiography, and possibly an electrophysiology study. When other causes have been ruled out, a test may be done to check the body's response to changes in position (tilt table test). °TREATMENT  °Most cases of vasovagal syncope do not require treatment. Your caregiver may recommend ways to avoid fainting triggers and may provide home strategies for preventing fainting. If you must be exposed to a possible trigger, you can drink additional fluids to help reduce your chances of having an episode of vasovagal syncope. If you have warning signs of an oncoming episode, you can respond by positioning yourself favorably (lying down). °If your fainting spells continue, you may be given medicines to prevent fainting. Some medicines may help make you more resistant to repeated episodes of vasovagal syncope. Special exercises or compression stockings may be recommended. In rare cases, the surgical placement   of a pacemaker is considered. °HOME CARE INSTRUCTIONS  °· Learn to identify the warning signs of vasovagal syncope.   °· Sit or lie down at the first warning sign of a fainting spell. If sitting, put your head down between your legs. If you lie down, swing your legs up in the air to increase blood flow to the brain.   °· Avoid hot tubs and saunas. °· Avoid prolonged  standing. °· Drink enough fluids to keep your urine clear or pale yellow. Avoid caffeine. °· Increase salt in your diet as directed by your caregiver.   °· If you have to stand for a long time, perform movements such as:   °¨ Crossing your legs.   °¨ Flexing and stretching your leg muscles.   °¨ Squatting.   °¨ Moving your legs.   °¨ Bending over.   °· Only take over-the-counter or prescription medicines as directed by your caregiver. Do not suddenly stop any medicines without asking your caregiver first.  °SEEK MEDICAL CARE IF:  °· Your fainting spells continue or happen more frequently in spite of treatment.   °· You lose consciousness for more than a couple minutes. °· You have fainting spells during or after exercising or after being startled.   °· You have new symptoms that occur with the fainting spells, such as:   °¨ Shortness of breath. °¨ Chest pain.   °¨ Irregular heartbeat.   °· You have episodes of twitching or jerky movements that last longer than a few seconds. °· You have episodes of twitching or jerky movements without obvious fainting. °SEEK IMMEDIATE MEDICAL CARE IF:  °· You have injuries or bleeding after a fainting spell.   °· You have episodes of twitching or jerky movements that last longer than 5 minutes.   °· You have more than one spell of twitching or jerky movements before returning to consciousness after fainting. °MAKE SURE YOU:  °· Understand these instructions. °· Will watch your condition. °· Will get help right away if you are not doing well or get worse. °Document Released: 01/29/2012 Document Reviewed: 01/29/2012 °[employer]® Patient Information ©2015 [employer]. This information is not intended to replace advice given to you by your health care provider. Make sure you discuss any questions you have with your health care provider. ° °

## 2013-11-26 NOTE — ED Provider Notes (Signed)
I saw and evaluated the patient, reviewed the resident's note and I agree with the findings and plan.   EKG Interpretation   Date/Time:  Wednesday November 24 2013 20:45:50 EDT Ventricular Rate:  94 PR Interval:  134 QRS Duration: 98 QT Interval:  360 QTC Calculation: 450 R Axis:   68 Text Interpretation:  Sinus rhythm RSR' in V1 or V2, probably normal  variant No significant change was found Confirmed by Neena Beecham  MD, Harith Mccadden  ([id number]) on 11/24/2013 11:24:16 PM      Well appearing. Pt will follow up with her pcp and cardiology. Recurrent syncope. ECG without significant abnormalities   Lyanne Co, MD 11/26/13 [id number]

## 2014-07-27 ENCOUNTER — Emergency Department (HOSPITAL_COMMUNITY)
Admission: EM | Admit: 2014-07-27 | Discharge: 2014-07-28 | Disposition: A | Discharge status: Home / Self Care | Payer: Medicare Other | Attending: Emergency Medicine | Admitting: Emergency Medicine

## 2014-07-27 ENCOUNTER — Encounter (HOSPITAL_COMMUNITY): Payer: Self-pay

## 2014-07-27 ENCOUNTER — Emergency Department (HOSPITAL_COMMUNITY): Payer: Medicare Other

## 2014-07-27 DIAGNOSIS — E11649 Type 2 diabetes mellitus with hypoglycemia without coma: Secondary | ICD-10-CM | POA: Diagnosis present

## 2014-07-27 DIAGNOSIS — Z3202 Encounter for pregnancy test, result negative: Secondary | ICD-10-CM | POA: Insufficient documentation

## 2014-07-27 DIAGNOSIS — Z794 Long term (current) use of insulin: Secondary | ICD-10-CM | POA: Insufficient documentation

## 2014-07-27 DIAGNOSIS — Z79899 Other long term (current) drug therapy: Secondary | ICD-10-CM | POA: Insufficient documentation

## 2014-07-27 DIAGNOSIS — E162 Hypoglycemia, unspecified: Secondary | ICD-10-CM

## 2014-07-27 DIAGNOSIS — Z880 Allergy status to penicillin: Secondary | ICD-10-CM | POA: Diagnosis not present

## 2014-07-27 DIAGNOSIS — F319 Bipolar disorder, unspecified: Secondary | ICD-10-CM | POA: Insufficient documentation

## 2014-07-27 DIAGNOSIS — F419 Anxiety disorder, unspecified: Secondary | ICD-10-CM | POA: Diagnosis not present

## 2014-07-27 DIAGNOSIS — Z91040 Latex allergy status: Secondary | ICD-10-CM | POA: Insufficient documentation

## 2014-07-27 DIAGNOSIS — R05 Cough: Secondary | ICD-10-CM

## 2014-07-27 DIAGNOSIS — Z720 Tobacco use: Secondary | ICD-10-CM | POA: Insufficient documentation

## 2014-07-27 DIAGNOSIS — J45909 Unspecified asthma, uncomplicated: Secondary | ICD-10-CM | POA: Insufficient documentation

## 2014-07-27 DIAGNOSIS — E1140 Type 2 diabetes mellitus with diabetic neuropathy, unspecified: Secondary | ICD-10-CM | POA: Diagnosis not present

## 2014-07-27 DIAGNOSIS — R059 Cough, unspecified: Secondary | ICD-10-CM

## 2014-07-27 HISTORY — DX: Bipolar disorder, unspecified: F31.9

## 2014-07-27 LAB — CBG MONITORING, ED: Glucose-Capillary: 258 mg/dL — ABNORMAL HIGH (ref 65–99)

## 2014-07-27 NOTE — ED Notes (Signed)
Pt CBG result was 258. Informed RN.

## 2014-07-27 NOTE — ED Provider Notes (Signed)
CSN: [id number]     Arrival date & time 07/27/14  2258 History   First MD Initiated Contact with Patient 07/27/14 2313     Chief Complaint  Patient presents with  . Hypoglycemia     (Consider location/radiation/quality/duration/timing/severity/associated sxs/prior Treatment) HPI   Melody Myers is a 34 y.o. female with past medical history significant for type 1 diabetes, asthma complaining of multiple episodes of hypoglycemia tonight with one episode of syncope. Patient states that she is to be on an insulin pump that she was taken off of it several months ago because she kept having hypoglycemic episodes. She has not had any recent change in her insulin regimen, she does not take any oral diabetes medications. She states that she started feeling lightheaded and sleepy and checked her blood sugar, it was 30, she ate some donuts and took a shower, when she got out of the shower she had the same feeling of hypoglycemia with lightheadedness and drowsiness. Patient states she drinks sodas, her daughter was prepared to give her rescue glucose but she had a loss of consciousness. EMS was called and patient was conscious on their arrival, further department administered one half tube of oral glucose prior to EMS arrival. Patient states she's been in her normal state of health but more stressed and anxious than normal because she is pending surgery for rectal tear and endoscopy and colonoscopy secondary to chronic GI issues. States that she skipped breakfast this morning but she did not administer her insulin. She states that she asked administered her insulin at noon and she states that she did eat lunch. She denies fever, chills, nausea, vomiting. She states that she's had a productive cough with shortness of breath worsening over the last several days. She is using her nebulizers and Singulair at home with little relief. She denies chest pain.  Past Medical History  Diagnosis Date  . Insulin dependent  diabetes mellitus with complications     insulin dependant  . Asthma   . Anxiety   . Diabetic neuropathy   . Depression     patient reports bipolor  . Bipolar 1 disorder   . Depression    Past Surgical History  Procedure Laterality Date  . Tubal ligation     No family history on file. History  Substance Use Topics  . Smoking status: Current Some Day Smoker -- 0.25 packs/day    Types: Cigarettes  . Smokeless tobacco: Not on file  . Alcohol Use: Yes     Comment: occasional   OB History    No data available     Review of Systems  10 systems reviewed and found to be negative, except as noted in the HPI.   Allergies  Amoxicillin; Hydrocodone; Latex; Penicillins; and Prednisone  Home Medications   Prior to Admission medications   Medication Sig Start Date End Date Taking? Authorizing Provider  ALPRAZolam Prudy Feeler) 1 MG tablet Take 1 mg by mouth 4 (four) times daily.     Historical Provider, MD  buPROPion (WELLBUTRIN XL) 150 MG 24 hr tablet Take 450 mg by mouth daily.    Historical Provider, MD  dexlansoprazole (DEXILANT) 60 MG capsule Take 60 mg by mouth 2 (two) times daily.    Historical Provider, MD  ibuprofen (ADVIL,MOTRIN) 200 MG tablet Take 800 mg by mouth every 6 (six) hours as needed.    Historical Provider, MD  Insulin Human (INSULIN PUMP) SOLN Inject 0.075 each into the skin every hour. novolog 0.75 units per hour  Historical Provider, MD  Ipratropium-Albuterol (COMBIVENT RESPIMAT) 20-100 MCG/ACT AERS respimat Inhale 1 puff into the lungs every 6 (six) hours.    Historical Provider, MD  ipratropium-albuterol (DUONEB) 0.5-2.5 (3) MG/3ML SOLN Take 3 mLs by nebulization 2 (two) times daily.     Historical Provider, MD  lithium carbonate (ESKALITH) 450 MG CR tablet Take 450 mg by mouth at bedtime.     Historical Provider, MD  mometasone [facility]) 220 MCG/INH inhaler Inhale 2 puffs into the lungs 2 (two) times daily.    Historical Provider, MD  montelukast (SINGULAIR) 10  MG tablet Take 10 mg by mouth at bedtime.    Historical Provider, MD  Multiple Vitamin (MULTIVITAMIN WITH MINERALS) TABS Take 1 tablet by mouth daily.    Historical Provider, MD  ondansetron (ZOFRAN) 4 MG tablet Take 4 mg by mouth every 8 (eight) hours as needed for nausea or vomiting.    Historical Provider, MD  pregabalin (LYRICA) 200 MG capsule Take 200 mg by mouth 3 (three) times daily.    Historical Provider, MD   BP 94/58 mmHg  Pulse 80  Temp(Src) 98.2 F (36.8 C) (Oral)  Resp 17  Ht  (1.626 m)  Wt 135 lb (61.236 kg)  BMI 23.16 kg/m2  SpO2 97%  LMP 07/22/2014 Physical Exam  Constitutional: She is oriented to person, place, and time. She appears well-developed and well-nourished. No distress.  HENT:  Head: Normocephalic.  Mouth/Throat: Oropharynx is clear and moist.  Eyes: Conjunctivae and EOM are normal. Pupils are equal, round, and reactive to light.  Neck: Normal range of motion.  Cardiovascular: Normal rate, regular rhythm and intact distal pulses.   Pulmonary/Chest: Effort normal and breath sounds normal. No stridor. No respiratory distress. She has no wheezes. She has no rales. She exhibits no tenderness.  Abdominal: Soft. Bowel sounds are normal. She exhibits no distension and no mass. There is no tenderness. There is no rebound and no guarding.  Musculoskeletal: Normal range of motion. She exhibits no edema or tenderness.  Neurological: She is alert and oriented to person, place, and time.  Skin: Skin is warm.  Psychiatric: She has a normal mood and affect.  Nursing note and vitals reviewed.   ED Course  Procedures (including critical care time) Labs Review Labs Reviewed  BASIC METABOLIC PANEL - Abnormal; Notable for the following:    Sodium 134 (*)    Glucose, Bld 289 (*)    BUN <5 (*)    All other components within normal limits  URINALYSIS, DIPSTICK ONLY - Abnormal; Notable for the following:    Specific Gravity, Urine 1.004 (*)    Glucose, UA 250 (*)     All other components within normal limits  CBG MONITORING, ED - Abnormal; Notable for the following:    Glucose-Capillary 258 (*)    All other components within normal limits  CBG MONITORING, ED - Abnormal; Notable for the following:    Glucose-Capillary 256 (*)    All other components within normal limits  CBG MONITORING, ED - Abnormal; Notable for the following:    Glucose-Capillary 233 (*)    All other components within normal limits  CBC WITH DIFFERENTIAL/PLATELET  I-STAT BETA HCG BLOOD, ED ([facility], [facility], [facility] ONLY)    Imaging Review Dg Chest 2 View  07/28/2014   CLINICAL DATA:  Cough.  Hypoglycemia.  EXAM: CHEST  2 VIEW  COMPARISON:  11/24/2013  FINDINGS: The cardiomediastinal contours are normal. Mildly prominent interstitial markings. Pulmonary vasculature is normal. No consolidation,  pleural effusion, or pneumothorax. No acute osseous abnormalities are seen.  IMPRESSION: No acute pulmonary process.   Electronically Signed   By: Rubye Oaks M.D.   On: 07/28/2014 00:49     EKG Interpretation None      MDM   Final diagnoses:  Cough  Hypoglycemia    Filed Vitals:   07/28/14 0045 07/28/14 0100 07/28/14 0115 07/28/14 0130  BP: 102/71 108/74 102/62 94/58  Pulse: 80 92 87 80  Temp:      TempSrc:      Resp:      Height:      Weight:      SpO2: 99% 100% 100% 97%    Melody Myers is a pleasant 34 y.o. female presenting with episodes of recurrent hypoglycemia resulting in a loss of consciousness. Patient is alert and oriented 3, blood sugar well over 100. Vital signs without abnormality. Patient reports cough and shortness of breath. Sounds are clear to auscultation bilaterally.  Will observe in the ED and check an x-ray.  Chest x-ray is without abnormality. Repeat blood sugar is above 200. Will observe for another hour and recheck.  Blood sugar has remained consistently within the high normal range. I've advised the patient to decrease her insulin administration and follow  closely with her primary care for recheck.   Evaluation does not show pathology that would require ongoing emergent intervention or inpatient treatment. Pt is hemodynamically stable and mentating appropriately. Discussed findings and plan with patient/guardian, who agrees with care plan. All questions answered. Return precautions discussed and outpatient follow up given.    Wynetta Emery, PA-C 07/28/14 0143  Mancel Bale, MD 07/29/14 Marlyne Beards

## 2014-07-27 NOTE — ED Notes (Signed)
Per [facility]- Pt states that her blood sugar was "in the 30's" around 9:30 pm so the pt drank sodas, ate doughnuts. Pts daughter states that the pt was unconscious, pt was conscious  was EMS arrived. Per EMS the fire department gave the pt about 1/2 a tube of oral glucose prior to EMS arrival, fire stated that pts blood sugar was "in the 60's". Pt was taken off an insulin pump due to low blood sugar.

## 2014-07-28 DIAGNOSIS — E11649 Type 2 diabetes mellitus with hypoglycemia without coma: Secondary | ICD-10-CM | POA: Diagnosis not present

## 2014-07-28 LAB — BASIC METABOLIC PANEL
ANION GAP: 9 (ref 5–15)
BUN: <5 mg/dL — ABNORMAL LOW (ref 6–20)
CHLORIDE: 103 mmol/L (ref 101–111)
CO2: 22 mmol/L (ref 22–32)
CREATININE: 0.69 mg/dL (ref 0.44–1.00)
Calcium: 8.9 mg/dL (ref 8.9–10.3)
GFR calc Af Amer: >60 mL/min (ref >60)
Glucose, Bld: 289 mg/dL — ABNORMAL HIGH (ref 65–99)
Potassium: 4.2 mmol/L (ref 3.5–5.1)
SODIUM: 134 mmol/L — AB (ref 135–145)

## 2014-07-28 LAB — CBC WITH DIFFERENTIAL/PLATELET
BASOS ABS: 0.1 10*3/uL (ref 0.0–0.1)
Basophils Relative: 1 % (ref 0–1)
EOS PCT: 2 % (ref 0–5)
Eosinophils Absolute: 0.2 10*3/uL (ref 0.0–0.7)
HEMATOCRIT: 38.6 % (ref 36.0–46.0)
Hemoglobin: 12.6 g/dL (ref 12.0–15.0)
Lymphocytes Relative: 35 % (ref 12–46)
Lymphs Abs: 3 10*3/uL (ref 0.7–4.0)
MCH: 29 pg (ref 26.0–34.0)
MCHC: 32.6 g/dL (ref 30.0–36.0)
MCV: 88.9 fL (ref 78.0–100.0)
MONO ABS: 0.5 10*3/uL (ref 0.1–1.0)
Monocytes Relative: 5 % (ref 3–12)
Neutro Abs: 5 10*3/uL (ref 1.7–7.7)
Neutrophils Relative %: 57 % (ref 43–77)
Platelets: 260 10*3/uL (ref 150–400)
RBC: 4.34 MIL/uL (ref 3.87–5.11)
RDW: 14.5 % (ref 11.5–15.5)
WBC: 8.8 10*3/uL (ref 4.0–10.5)

## 2014-07-28 LAB — URINALYSIS, DIPSTICK ONLY
Bilirubin Urine: NEGATIVE
Glucose, UA: 250 mg/dL — AB
Hgb urine dipstick: NEGATIVE
Ketones, ur: NEGATIVE mg/dL
Leukocytes, UA: NEGATIVE
Nitrite: NEGATIVE
Protein, ur: NEGATIVE mg/dL
Specific Gravity, Urine: 1.004 — ABNORMAL LOW (ref 1.005–1.030)
Urobilinogen, UA: 0.2 mg/dL (ref 0.0–1.0)
pH: 6.5 (ref 5.0–8.0)

## 2014-07-28 LAB — CBG MONITORING, ED
Glucose-Capillary: 233 mg/dL — ABNORMAL HIGH (ref 65–99)
Glucose-Capillary: 256 mg/dL — ABNORMAL HIGH (ref 65–99)

## 2014-07-28 LAB — I-STAT BETA HCG BLOOD, ED (MC, WL, AP ONLY)

## 2014-07-28 NOTE — Discharge Instructions (Signed)
Please follow with your primary care doctor in the next 2 days for a check-up. They must obtain records for further management.   Do not hesitate to return to the Emergency Department for any new, worsening or concerning symptoms.    Hypoglycemia Hypoglycemia occurs when the glucose in your blood is too low. Glucose is a type of sugar that is your body's main energy source. Hormones, such as insulin and glucagon, control the level of glucose in the blood. Insulin lowers blood glucose and glucagon increases blood glucose. Having too much insulin in your blood stream, or not eating enough food containing sugar, can result in hypoglycemia. Hypoglycemia can happen to people with or without diabetes. It can develop quickly and can be a medical emergency.  CAUSES   Missing or delaying meals.  Not eating enough carbohydrates at meals.  Taking too much diabetes medicine.  Not timing your oral diabetes medicine or insulin doses with meals, snacks, and exercise.  Nausea and vomiting.  Certain medicines.  Severe illnesses, such as hepatitis, kidney disorders, and certain eating disorders.  Increased activity or exercise without eating something extra or adjusting medicines.  Drinking too much alcohol.  A nerve disorder that affects body functions like your heart rate, blood pressure, and digestion (autonomic neuropathy).  A condition where the stomach muscles do not function properly (gastroparesis). Therefore, medicines and food may not absorb properly.  Rarely, a tumor of the pancreas can produce too much insulin. SYMPTOMS   Hunger.  Sweating (diaphoresis).  Change in body temperature.  Shakiness.  Headache.  Anxiety.  Lightheadedness.  Irritability.  Difficulty concentrating.  Dry mouth.  Tingling or numbness in the hands or feet.  Restless sleep or sleep disturbances.  Altered speech and coordination.  Change in mental status.  Seizures or prolonged  convulsions.  Combativeness.  Drowsiness (lethargic).  Weakness.  Increased heart rate or palpitations.  Confusion.  Pale, gray skin color.  Blurred or double vision.  Fainting. DIAGNOSIS  A physical exam and medical history will be performed. Your caregiver may make a diagnosis based on your symptoms. Blood tests and other lab tests may be performed to confirm a diagnosis. Once the diagnosis is made, your caregiver will see if your signs and symptoms go away once your blood glucose is raised.  TREATMENT  Usually, you can easily treat your hypoglycemia when you notice symptoms.  Check your blood glucose. If it is less than 70 mg/dl, take one of the following:   3-4 glucose tablets.    cup juice.    cup regular soda.   1 cup skim milk.   -1 tube of glucose gel.   5-6 hard candies.   Avoid high-fat drinks or food that may delay a rise in blood glucose levels.  Do not take more than the recommended amount of sugary foods, drinks, gel, or tablets. Doing so will cause your blood glucose to go too high.   Wait 10-15 minutes and recheck your blood glucose. If it is still less than 70 mg/dl or below your target range, repeat treatment.   Eat a snack if it is more than 1 hour until your next meal.  There may be a time when your blood glucose may go so low that you are unable to treat yourself at home when you start to notice symptoms. You may need someone to help you. You may even faint or be unable to swallow. If you cannot treat yourself, someone will need to bring you to the  hospital.  HOME CARE INSTRUCTIONS  If you have diabetes, follow your diabetes management plan by:  Taking your medicines as directed.  Following your exercise plan.  Following your meal plan. Do not skip meals. Eat on time.  Testing your blood glucose regularly. Check your blood glucose before and after exercise. If you exercise longer or different than usual, be sure to check blood  glucose more frequently.  Wearing your medical alert jewelry that says you have diabetes.  Identify the cause of your hypoglycemia. Then, develop ways to prevent the recurrence of hypoglycemia.  Do not take a hot bath or shower right after an insulin shot.  Always carry treatment with you. Glucose tablets are the easiest to carry.  If you are going to drink alcohol, drink it only with meals.  Tell friends or family members ways to keep you safe during a seizure. This may include removing hard or sharp objects from the area or turning you on your side.  Maintain a healthy weight. SEEK MEDICAL CARE IF:   You are having problems keeping your blood glucose in your target range.  You are having frequent episodes of hypoglycemia.  You feel you might be having side effects from your medicines.  You are not sure why your blood glucose is dropping so low.  You notice a change in vision or a new problem with your vision. SEEK IMMEDIATE MEDICAL CARE IF:   Confusion develops.  A change in mental status occurs.  The inability to swallow develops.  Fainting occurs. Document Released: 02/11/2005 Document Revised: 02/16/2013 Document Reviewed: 06/10/2011 [facility] Patient Information 2015 [place], [place]. This information is not intended to replace advice given to you by your health care provider. Make sure you discuss any questions you have with your health care provider.

## 2014-09-16 ENCOUNTER — Emergency Department (HOSPITAL_COMMUNITY): Payer: Medicare Other

## 2014-09-16 ENCOUNTER — Encounter (HOSPITAL_COMMUNITY): Payer: Self-pay | Admitting: Emergency Medicine

## 2014-09-16 ENCOUNTER — Emergency Department (HOSPITAL_COMMUNITY)
Admission: EM | Admit: 2014-09-16 | Discharge: 2014-09-16 | Disposition: A | Discharge status: Home / Self Care | Payer: Medicare Other | Attending: Emergency Medicine | Admitting: Emergency Medicine

## 2014-09-16 DIAGNOSIS — Y9389 Activity, other specified: Secondary | ICD-10-CM | POA: Insufficient documentation

## 2014-09-16 DIAGNOSIS — Z720 Tobacco use: Secondary | ICD-10-CM | POA: Insufficient documentation

## 2014-09-16 DIAGNOSIS — Z91040 Latex allergy status: Secondary | ICD-10-CM | POA: Diagnosis not present

## 2014-09-16 DIAGNOSIS — F419 Anxiety disorder, unspecified: Secondary | ICD-10-CM | POA: Diagnosis not present

## 2014-09-16 DIAGNOSIS — R55 Syncope and collapse: Secondary | ICD-10-CM | POA: Diagnosis present

## 2014-09-16 DIAGNOSIS — Z794 Long term (current) use of insulin: Secondary | ICD-10-CM | POA: Insufficient documentation

## 2014-09-16 DIAGNOSIS — Z7951 Long term (current) use of inhaled steroids: Secondary | ICD-10-CM | POA: Diagnosis not present

## 2014-09-16 DIAGNOSIS — F319 Bipolar disorder, unspecified: Secondary | ICD-10-CM | POA: Insufficient documentation

## 2014-09-16 DIAGNOSIS — Y92410 Unspecified street and highway as the place of occurrence of the external cause: Secondary | ICD-10-CM | POA: Insufficient documentation

## 2014-09-16 DIAGNOSIS — J45909 Unspecified asthma, uncomplicated: Secondary | ICD-10-CM | POA: Insufficient documentation

## 2014-09-16 DIAGNOSIS — Y998 Other external cause status: Secondary | ICD-10-CM | POA: Diagnosis not present

## 2014-09-16 DIAGNOSIS — Z880 Allergy status to penicillin: Secondary | ICD-10-CM | POA: Insufficient documentation

## 2014-09-16 DIAGNOSIS — S199XXA Unspecified injury of neck, initial encounter: Secondary | ICD-10-CM | POA: Diagnosis not present

## 2014-09-16 DIAGNOSIS — S3992XA Unspecified injury of lower back, initial encounter: Secondary | ICD-10-CM | POA: Insufficient documentation

## 2014-09-16 DIAGNOSIS — E1140 Type 2 diabetes mellitus with diabetic neuropathy, unspecified: Secondary | ICD-10-CM | POA: Insufficient documentation

## 2014-09-16 DIAGNOSIS — Z79899 Other long term (current) drug therapy: Secondary | ICD-10-CM | POA: Diagnosis not present

## 2014-09-16 DIAGNOSIS — S0990XA Unspecified injury of head, initial encounter: Secondary | ICD-10-CM | POA: Diagnosis not present

## 2014-09-16 HISTORY — DX: Syncope and collapse: R55

## 2014-09-16 LAB — CBG MONITORING, ED: GLUCOSE-CAPILLARY: 119 mg/dL — AB (ref 65–99)

## 2014-09-16 LAB — CBC
HCT: 39.1 % (ref 36.0–46.0)
HEMOGLOBIN: 12.8 g/dL (ref 12.0–15.0)
MCH: 29 pg (ref 26.0–34.0)
MCHC: 32.7 g/dL (ref 30.0–36.0)
MCV: 88.5 fL (ref 78.0–100.0)
PLATELETS: 234 10*3/uL (ref 150–400)
RBC: 4.42 MIL/uL (ref 3.87–5.11)
RDW: 13.7 % (ref 11.5–15.5)
WBC: 10.3 10*3/uL (ref 4.0–10.5)

## 2014-09-16 LAB — URINALYSIS, ROUTINE W REFLEX MICROSCOPIC
BILIRUBIN URINE: NEGATIVE
Hgb urine dipstick: NEGATIVE
Ketones, ur: NEGATIVE mg/dL
LEUKOCYTES UA: NEGATIVE
NITRITE: NEGATIVE
PROTEIN: NEGATIVE mg/dL
SPECIFIC GRAVITY, URINE: 1.024 (ref 1.005–1.030)
UROBILINOGEN UA: 0.2 mg/dL (ref 0.0–1.0)
pH: 5.5 (ref 5.0–8.0)

## 2014-09-16 LAB — I-STAT CHEM 8, ED
BUN: 4 mg/dL — AB (ref 6–20)
CHLORIDE: 105 mmol/L (ref 101–111)
Calcium, Ion: 1.17 mmol/L (ref 1.12–1.23)
Creatinine, Ser: 0.6 mg/dL (ref 0.44–1.00)
Glucose, Bld: 155 mg/dL — ABNORMAL HIGH (ref 65–99)
HCT: 36 % (ref 36.0–46.0)
Hemoglobin: 12.2 g/dL (ref 12.0–15.0)
Potassium: 3.3 mmol/L — ABNORMAL LOW (ref 3.5–5.1)
Sodium: 141 mmol/L (ref 135–145)
TCO2: 23 mmol/L (ref 0–100)

## 2014-09-16 LAB — COMPREHENSIVE METABOLIC PANEL
ALT: 9 U/L — ABNORMAL LOW (ref 14–54)
AST: 13 U/L — ABNORMAL LOW (ref 15–41)
Albumin: 3.6 g/dL (ref 3.5–5.0)
Alkaline Phosphatase: 49 U/L (ref 38–126)
Anion gap: 9 (ref 5–15)
BUN: <5 mg/dL — ABNORMAL LOW (ref 6–20)
CO2: 25 mmol/L (ref 22–32)
Calcium: 8.8 mg/dL — ABNORMAL LOW (ref 8.9–10.3)
Chloride: 106 mmol/L (ref 101–111)
Creatinine, Ser: 0.66 mg/dL (ref 0.44–1.00)
GFR calc Af Amer: >60 mL/min (ref >60)
GFR calc non Af Amer: >60 mL/min (ref >60)
Glucose, Bld: 117 mg/dL — ABNORMAL HIGH (ref 65–99)
Potassium: 3.2 mmol/L — ABNORMAL LOW (ref 3.5–5.1)
Sodium: 140 mmol/L (ref 135–145)
Total Bilirubin: 0.3 mg/dL (ref 0.3–1.2)
Total Protein: 6.3 g/dL — ABNORMAL LOW (ref 6.5–8.1)

## 2014-09-16 LAB — URINE MICROSCOPIC-ADD ON

## 2014-09-16 MED ORDER — NAPROXEN 500 MG PO TABS: 500.0000 mg | ORAL_TABLET | Freq: Two times a day (BID) | ORAL | Status: DC

## 2014-09-16 MED ORDER — SODIUM CHLORIDE 0.9 % IV BOLUS (SEPSIS)
1000.0000 mL | Freq: Once | INTRAVENOUS | Status: AC
  Administered 2014-09-16: 1000 mL via INTRAVENOUS

## 2014-09-16 MED ORDER — ONDANSETRON 4 MG PO TBDP
4.0000 mg | ORAL_TABLET | Freq: Three times a day (TID) | ORAL | Status: DC | PRN
Start: 2014-09-16 — End: 2020-01-13

## 2014-09-16 NOTE — ED Notes (Signed)
CBG is 119. Notified nurse Hayley.

## 2014-09-16 NOTE — ED Provider Notes (Signed)
CSN: [id number]     Arrival date & time 09/16/14  1754 History   First MD Initiated Contact with Patient 09/16/14 1754     Chief Complaint  Patient presents with  . Loss of Consciousness  . Asthma     (Consider location/radiation/quality/duration/timing/severity/associated sxs/prior Treatment) HPI Comments: The patient is a 34 year old female, she has a complicated history of frequent episodes of syncope which is currently undiagnosed, this has been going on for well over one year. She is also an insulin requiring diabetic. She presents to the hospital after yet another syncopal episode that occurred just prior to arrival. She reports being in her car, she had just driven home and had her 2 children age 31 and 31 in the back seat. When she opened the door she remembers falling out of the car and the next thing she remembers the paramedics were putting her in the ambulance. She arrived by ambulance, complains of generalized pain but specifically headache neck pain and back pain. Cervical collar was placed prior to arrival. Oxygen saturations were in the normal range, she was given a nebulizer prior to arrival. She does not complain of chest pain or shortness of breath. She denies any prodromal symptoms though she does state that over the last 2 weeks her blood sugar has been measured over 450. She has had urinary frequency and increased thirst. She denies dysuria diarrhea or rectal bleeding swelling rashes numbness or weakness.  Patient is a 34 y.o. female presenting with syncope and asthma. The history is provided by the patient.  Loss of Consciousness Asthma    Past Medical History  Diagnosis Date  . Insulin dependent diabetes mellitus with complications     insulin dependant  . Asthma   . Anxiety   . Diabetic neuropathy   . Depression     patient reports bipolor  . Bipolar 1 disorder   . Depression   . Syncopal episodes    Past Surgical History  Procedure Laterality Date  . Tubal  ligation     No family history on file. History  Substance Use Topics  . Smoking status: Current Some Day Smoker -- 0.25 packs/day    Types: Cigarettes  . Smokeless tobacco: Not on file  . Alcohol Use: Yes     Comment: occasional   OB History    No data available     Review of Systems  Cardiovascular: Positive for syncope.  All other systems reviewed and are negative.     Allergies  Amoxicillin; Hydrocodone; Latex; Penicillins; and Prednisone  Home Medications   Prior to Admission medications   Medication Sig Start Date End Date Taking? Authorizing Provider  acetaminophen (TYLENOL) 325 MG tablet Take 650 mg by mouth every 6 (six) hours as needed.   Yes Historical Provider, MD  ALPRAZolam Prudy Feeler) 1 MG tablet Take 1 mg by mouth 4 (four) times daily.    Yes Historical Provider, MD  BELSOMRA 15 MG TABS Take 1 tablet by mouth at bedtime as needed. for sleep 09/06/14  Yes Historical Provider, MD  buPROPion (WELLBUTRIN XL) 150 MG 24 hr tablet Take 450 mg by mouth daily.   Yes Historical Provider, MD  esomeprazole (NEXIUM) 20 MG capsule Take 20 mg by mouth 2 (two) times daily before a meal.    Yes Historical Provider, MD  gabapentin (NEURONTIN) 300 MG capsule Take 300 mg by mouth 3 (three) times daily. 08/04/14  Yes Historical Provider, MD  ibuprofen (ADVIL,MOTRIN) 200 MG tablet Take 800 mg by  mouth every 6 (six) hours as needed.   Yes Historical Provider, MD  insulin aspart (NOVOLOG) 100 UNIT/ML injection Inject 2-10 Units into the skin 3 (three) times daily before meals.   Yes Historical Provider, MD  Insulin Glargine (TOUJEO SOLOSTAR) 300 UNIT/ML SOPN Inject 28 Units into the skin daily.   Yes Historical Provider, MD  ipratropium-albuterol (DUONEB) 0.5-2.5 (3) MG/3ML SOLN Take 3 mLs by nebulization 2 (two) times daily.    Yes Historical Provider, MD  lithium carbonate (ESKALITH) 450 MG CR tablet Take 450 mg by mouth at bedtime.    Yes Historical Provider, MD  mometasone [facility]) 220  MCG/INH inhaler Inhale 2 puffs into the lungs 2 (two) times daily.   Yes Historical Provider, MD  montelukast (SINGULAIR) 10 MG tablet Take 10 mg by mouth at bedtime.   Yes Historical Provider, MD  Multiple Vitamin (MULTIVITAMIN WITH MINERALS) TABS Take 1 tablet by mouth daily.   Yes Historical Provider, MD  ondansetron (ZOFRAN) 4 MG tablet Take 4 mg by mouth every 8 (eight) hours as needed for nausea or vomiting.   Yes Historical Provider, MD  naproxen (NAPROSYN) 500 MG tablet Take 1 tablet (500 mg total) by mouth 2 (two) times daily with a meal. 09/16/14   Eber Hong, MD  ondansetron (ZOFRAN ODT) 4 MG disintegrating tablet Take 1 tablet (4 mg total) by mouth every 8 (eight) hours as needed for nausea. 09/16/14   Eber Hong, MD   BP 111/71 mmHg  Pulse 84  Temp(Src) 98.7 F (37.1 C)  Resp 26  SpO2 100% Physical Exam  Constitutional: She appears well-developed and well-nourished. No distress.  HENT:  Head: Normocephalic and atraumatic.  Mouth/Throat: Oropharynx is clear and moist. No oropharyngeal exudate.  no facial tenderness, deformity, malocclusion or hemotympanum.  no battle's sign or racoon eyes.   Eyes: Conjunctivae and EOM are normal. Pupils are equal, round, and reactive to light. Right eye exhibits no discharge. Left eye exhibits no discharge. No scleral icterus.  Neck: No JVD present. No thyromegaly present.  Cardiovascular: Normal rate, regular rhythm, normal heart sounds and intact distal pulses.  Exam reveals no gallop and no friction rub.   No murmur heard. Pulmonary/Chest: Effort normal and breath sounds normal. No respiratory distress. She has no wheezes. She has no rales.  Abdominal: Soft. Bowel sounds are normal. She exhibits no distension and no mass. There is no tenderness.  Musculoskeletal: Normal range of motion. She exhibits tenderness ( Tenderness to palpation over the cervical spine). She exhibits no edema.  Lymphadenopathy:    She has no cervical adenopathy.   Neurological: She is alert. Coordination normal.  Alert and oriented 4, able to move all 4 extremities without difficulty, normal strength and sensation in all 4 extremities, cranial nerves III through XII intact. Speech is clear, no limb ataxia, normal finger nose, no pronator drift.  Skin: Skin is warm and dry. No rash noted. No erythema.  Psychiatric: She has a normal mood and affect. Her behavior is normal.  Nursing note and vitals reviewed.   ED Course  Procedures (including critical care time) Labs Review Labs Reviewed  COMPREHENSIVE METABOLIC PANEL - Abnormal; Notable for the following:    Potassium 3.2 (*)    Glucose, Bld 117 (*)    BUN <5 (*)    Calcium 8.8 (*)    Total Protein 6.3 (*)    AST 13 (*)    ALT 9 (*)    All other components within normal limits  URINALYSIS,  ROUTINE W REFLEX MICROSCOPIC (NOT AT [facility]) - Abnormal; Notable for the following:    APPearance CLOUDY (*)    Glucose, UA >1000 (*)    All other components within normal limits  URINE MICROSCOPIC-ADD ON - Abnormal; Notable for the following:    Squamous Epithelial / LPF MANY (*)    Bacteria, UA FEW (*)    All other components within normal limits  I-STAT CHEM 8, ED - Abnormal; Notable for the following:    Potassium 3.3 (*)    BUN 4 (*)    Glucose, Bld 155 (*)    All other components within normal limits  CBG MONITORING, ED - Abnormal; Notable for the following:    Glucose-Capillary 119 (*)    All other components within normal limits  CBC    Imaging Review Ct Head Wo Contrast  09/16/2014   CLINICAL DATA:  Syncope with fall  EXAM: CT HEAD WITHOUT CONTRAST  CT CERVICAL SPINE WITHOUT CONTRAST  TECHNIQUE: Multidetector CT imaging of the head and cervical spine was performed following the standard protocol without intravenous contrast. Multiplanar CT image reconstructions of the cervical spine were also generated.  COMPARISON:  None.  FINDINGS: CT HEAD FINDINGS  The ventricles are normal in size and  configuration. There is no intracranial mass, hemorrhage, extra-axial fluid collection, or midline shift. Gray-white compartments are normal. No acute infarct evident. Bony calvarium appears intact. The mastoid air cells are clear. There is opacification of a posterior ethmoid air cell on the left.  CT CERVICAL SPINE FINDINGS  There is no acute fracture or spondylolisthesis. Prevertebral soft tissues and predental space regions are normal. There is a focal area of calcification posterior to the left C2 lamina, possibly residua of old trauma. Disc spaces appear intact. No nerve root edema or effacement. No disc extrusion. Thyroid appears normal. Lung apices are clear.  IMPRESSION: CT head: Mild left ethmoid sinus disease. No intracranial mass, hemorrhage, or extra-axial fluid collection. Gray-white compartments appear normal.  CT cervical spine: No fracture or spondylolisthesis. Focal area of calcification posterior to the left C2 lamina may represent residua of old injury. No appreciable arthropathic change.   Electronically Signed   By: Bretta Bang III M.D.   On: 09/16/2014 19:15   Ct Cervical Spine Wo Contrast  09/16/2014   CLINICAL DATA:  Syncope with fall  EXAM: CT HEAD WITHOUT CONTRAST  CT CERVICAL SPINE WITHOUT CONTRAST  TECHNIQUE: Multidetector CT imaging of the head and cervical spine was performed following the standard protocol without intravenous contrast. Multiplanar CT image reconstructions of the cervical spine were also generated.  COMPARISON:  None.  FINDINGS: CT HEAD FINDINGS  The ventricles are normal in size and configuration. There is no intracranial mass, hemorrhage, extra-axial fluid collection, or midline shift. Gray-white compartments are normal. No acute infarct evident. Bony calvarium appears intact. The mastoid air cells are clear. There is opacification of a posterior ethmoid air cell on the left.  CT CERVICAL SPINE FINDINGS  There is no acute fracture or spondylolisthesis.  Prevertebral soft tissues and predental space regions are normal. There is a focal area of calcification posterior to the left C2 lamina, possibly residua of old trauma. Disc spaces appear intact. No nerve root edema or effacement. No disc extrusion. Thyroid appears normal. Lung apices are clear.  IMPRESSION: CT head: Mild left ethmoid sinus disease. No intracranial mass, hemorrhage, or extra-axial fluid collection. Gray-white compartments appear normal.  CT cervical spine: No fracture or spondylolisthesis. Focal area of calcification posterior  to the left C2 lamina may represent residua of old injury. No appreciable arthropathic change.   Electronically Signed   By: Bretta Bang III M.D.   On: 09/16/2014 19:15    ED ECG REPORT  I personally interpreted this EKG   Date: 09/16/2014   Rate: 88  Rhythm: normal sinus rhythm  QRS Axis: normal  Intervals: normal  ST/T Wave abnormalities: normal  Conduction Disutrbances:none  Narrative Interpretation:   Old EKG Reviewed: unchanged    MDM   Final diagnoses:  Syncope  Syncope      The patient does not appear to be in distress, she has no signs of injury, she has increased blood sugars at home which may have resulted in some relative hypotension and dehydration. We'll give IV fluids, check blood sugar as well as other metabolic labs, rule out urinary tract infection or pregnancy, cardiac monitoring. Patient is in agreement with the plan. No obvious signs of head injury. She has no focal neurologic deficits but does have cervical spine pain. She will need CT scan imaging of the cervical spine.  Cervical spine imaging negative, head CT negative, labs unremarkable, the patient is feeling better, she is neurologically intact and ambulatory, vital signs are unremarkable, stable for discharge. Respirations on my exam are 15.  Eber Hong, MD 09/16/14 2112

## 2014-09-16 NOTE — ED Notes (Signed)
Pt arrived by [facility] with c/o syncopal episode from sitting position in car to ground and after episode pt started having an asthmas attack. Pt self administered albuterol inhaler x 3. Pt has been having syncopal episodes at least 1-2 times a month and has been seen for syncopal episode and have not found a cause as of yet. Pt c/o generalized pain and neck and back pain. Pt was placed on c collar on scene by EMS. Per EMS pt O2sat 96%ra with some wheezing when they arrived and administered albuterol neb tx. 20g R AC.

## 2014-09-16 NOTE — ED Notes (Signed)
Pt left with all belongings and with significant other. Pt refused wheelchair.

## 2014-09-16 NOTE — Discharge Instructions (Signed)

## 2014-09-16 NOTE — ED Notes (Signed)
The patient is aware that a urine specimen is needed. 

## 2014-09-23 DIAGNOSIS — N8189 Other female genital prolapse: Secondary | ICD-10-CM | POA: Insufficient documentation

## 2014-10-27 HISTORY — PX: VAGINA RECONSTRUCTION SURGERY: SHX828

## 2014-11-07 DIAGNOSIS — Q437 Persistent cloaca: Secondary | ICD-10-CM | POA: Insufficient documentation

## 2014-11-07 HISTORY — DX: Persistent cloaca: Q43.7

## 2014-11-08 DIAGNOSIS — I959 Hypotension, unspecified: Secondary | ICD-10-CM | POA: Insufficient documentation

## 2014-11-13 ENCOUNTER — Emergency Department (HOSPITAL_COMMUNITY): Payer: Medicare Other

## 2014-11-13 ENCOUNTER — Encounter (HOSPITAL_COMMUNITY): Payer: Self-pay | Admitting: Emergency Medicine

## 2014-11-13 ENCOUNTER — Inpatient Hospital Stay (HOSPITAL_COMMUNITY)
Admission: EM | Admit: 2014-11-13 | Discharge: 2014-11-15 | DRG: 638 | Disposition: A | Discharge status: Home / Self Care | Payer: Medicare Other | Attending: Student in an Organized Health Care Education/Training Program | Admitting: Student in an Organized Health Care Education/Training Program

## 2014-11-13 DIAGNOSIS — E1040 Type 1 diabetes mellitus with diabetic neuropathy, unspecified: Secondary | ICD-10-CM | POA: Diagnosis present

## 2014-11-13 DIAGNOSIS — Z794 Long term (current) use of insulin: Secondary | ICD-10-CM | POA: Diagnosis not present

## 2014-11-13 DIAGNOSIS — Z720 Tobacco use: Secondary | ICD-10-CM

## 2014-11-13 DIAGNOSIS — E876 Hypokalemia: Secondary | ICD-10-CM

## 2014-11-13 DIAGNOSIS — R338 Other retention of urine: Secondary | ICD-10-CM | POA: Diagnosis present

## 2014-11-13 DIAGNOSIS — Z9114 Patient's other noncompliance with medication regimen: Secondary | ICD-10-CM | POA: Diagnosis present

## 2014-11-13 DIAGNOSIS — R6510 Systemic inflammatory response syndrome (SIRS) of non-infectious origin without acute organ dysfunction: Secondary | ICD-10-CM | POA: Diagnosis present

## 2014-11-13 DIAGNOSIS — T814XXA Infection following a procedure, initial encounter: Secondary | ICD-10-CM | POA: Diagnosis present

## 2014-11-13 DIAGNOSIS — J45909 Unspecified asthma, uncomplicated: Secondary | ICD-10-CM | POA: Diagnosis present

## 2014-11-13 DIAGNOSIS — F329 Major depressive disorder, single episode, unspecified: Secondary | ICD-10-CM | POA: Diagnosis present

## 2014-11-13 DIAGNOSIS — F419 Anxiety disorder, unspecified: Secondary | ICD-10-CM | POA: Diagnosis present

## 2014-11-13 DIAGNOSIS — T148 Other injury of unspecified body region: Secondary | ICD-10-CM | POA: Diagnosis not present

## 2014-11-13 DIAGNOSIS — E1010 Type 1 diabetes mellitus with ketoacidosis without coma: Secondary | ICD-10-CM | POA: Diagnosis present

## 2014-11-13 DIAGNOSIS — G8918 Other acute postprocedural pain: Secondary | ICD-10-CM

## 2014-11-13 DIAGNOSIS — R339 Retention of urine, unspecified: Secondary | ICD-10-CM

## 2014-11-13 DIAGNOSIS — D72829 Elevated white blood cell count, unspecified: Secondary | ICD-10-CM | POA: Diagnosis present

## 2014-11-13 DIAGNOSIS — N179 Acute kidney failure, unspecified: Secondary | ICD-10-CM

## 2014-11-13 DIAGNOSIS — N9989 Other postprocedural complications and disorders of genitourinary system: Secondary | ICD-10-CM | POA: Diagnosis present

## 2014-11-13 DIAGNOSIS — E1110 Type 2 diabetes mellitus with ketoacidosis without coma: Secondary | ICD-10-CM | POA: Diagnosis present

## 2014-11-13 DIAGNOSIS — E860 Dehydration: Secondary | ICD-10-CM | POA: Diagnosis present

## 2014-11-13 DIAGNOSIS — J189 Pneumonia, unspecified organism: Secondary | ICD-10-CM

## 2014-11-13 DIAGNOSIS — E118 Type 2 diabetes mellitus with unspecified complications: Secondary | ICD-10-CM

## 2014-11-13 DIAGNOSIS — IMO0001 Reserved for inherently not codable concepts without codable children: Secondary | ICD-10-CM

## 2014-11-13 DIAGNOSIS — T8141XA Infection following a procedure, superficial incisional surgical site, initial encounter: Secondary | ICD-10-CM

## 2014-11-13 LAB — BASIC METABOLIC PANEL
Anion gap: 14 (ref 5–15)
Anion gap: 7 (ref 5–15)
Anion gap: 6 (ref 5–15)
Anion gap: 7 (ref 5–15)
BUN: 11 mg/dL (ref 6–20)
BUN: 9 mg/dL (ref 6–20)
BUN: 8 mg/dL (ref 6–20)
BUN: 8 mg/dL (ref 6–20)
CALCIUM: 8 mg/dL — AB (ref 8.9–10.3)
CHLORIDE: 110 mmol/L (ref 101–111)
CHLORIDE: 110 mmol/L (ref 101–111)
CO2: 8 mmol/L — AB (ref 22–32)
CO2: 12 mmol/L — ABNORMAL LOW (ref 22–32)
CO2: 15 mmol/L — ABNORMAL LOW (ref 22–32)
CO2: 15 mmol/L — ABNORMAL LOW (ref 22–32)
CREATININE: 1.1 mg/dL — AB (ref 0.44–1.00)
CREATININE: 0.73 mg/dL (ref 0.44–1.00)
CREATININE: 0.72 mg/dL (ref 0.44–1.00)
CREATININE: 0.7 mg/dL (ref 0.44–1.00)
Calcium: 8.4 mg/dL — ABNORMAL LOW (ref 8.9–10.3)
Calcium: 8.1 mg/dL — ABNORMAL LOW (ref 8.9–10.3)
Calcium: 8.1 mg/dL — ABNORMAL LOW (ref 8.9–10.3)
Chloride: 114 mmol/L — ABNORMAL HIGH (ref 101–111)
Chloride: 110 mmol/L (ref 101–111)
GFR calc Af Amer: >60 mL/min (ref >60)
GFR calc Af Amer: >60 mL/min (ref >60)
GFR calc Af Amer: >60 mL/min (ref >60)
GFR calc non Af Amer: >60 mL/min (ref >60)
GLUCOSE: 299 mg/dL — AB (ref 65–99)
GLUCOSE: 205 mg/dL — AB (ref 65–99)
GLUCOSE: 163 mg/dL — AB (ref 65–99)
GLUCOSE: 142 mg/dL — AB (ref 65–99)
POTASSIUM: 3.5 mmol/L (ref 3.5–5.1)
POTASSIUM: 3.6 mmol/L (ref 3.5–5.1)
Potassium: 4 mmol/L (ref 3.5–5.1)
Potassium: 3.8 mmol/L (ref 3.5–5.1)
SODIUM: 133 mmol/L — AB (ref 135–145)
SODIUM: 131 mmol/L — AB (ref 135–145)
SODIUM: 132 mmol/L — AB (ref 135–145)
Sodium: 132 mmol/L — ABNORMAL LOW (ref 135–145)

## 2014-11-13 LAB — BLOOD GAS, VENOUS

## 2014-11-13 LAB — COMPREHENSIVE METABOLIC PANEL
ALBUMIN: 4.2 g/dL (ref 3.5–5.0)
ALT: 13 U/L — AB (ref 14–54)
AST: 14 U/L — AB (ref 15–41)
Alkaline Phosphatase: 94 U/L (ref 38–126)
Anion gap: 23 — ABNORMAL HIGH (ref 5–15)
BUN: 15 mg/dL (ref 6–20)
CALCIUM: 9.6 mg/dL (ref 8.9–10.3)
CHLORIDE: 101 mmol/L (ref 101–111)
CO2: 6 mmol/L — AB (ref 22–32)
CREATININE: 1.41 mg/dL — AB (ref 0.44–1.00)
GFR calc non Af Amer: 49 mL/min — ABNORMAL LOW (ref >60)
GFR, EST AFRICAN AMERICAN: 56 mL/min — AB (ref >60)
GLUCOSE: 438 mg/dL — AB (ref 65–99)
Potassium: 4.6 mmol/L (ref 3.5–5.1)
SODIUM: 130 mmol/L — AB (ref 135–145)
Total Bilirubin: 1.1 mg/dL (ref 0.3–1.2)
Total Protein: 7.7 g/dL (ref 6.5–8.1)

## 2014-11-13 LAB — I-STAT CHEM 8, ED
BUN: 19 mg/dL (ref 6–20)
CALCIUM ION: 1.29 mmol/L — AB (ref 1.12–1.23)
CHLORIDE: 106 mmol/L (ref 101–111)
Creatinine, Ser: 0.8 mg/dL (ref 0.44–1.00)
GLUCOSE: 453 mg/dL — AB (ref 65–99)
HCT: 48 % — ABNORMAL HIGH (ref 36.0–46.0)
HEMOGLOBIN: 16.3 g/dL — AB (ref 12.0–15.0)
Potassium: 4.5 mmol/L (ref 3.5–5.1)
SODIUM: 132 mmol/L — AB (ref 135–145)
TCO2: 8 mmol/L (ref 0–100)

## 2014-11-13 LAB — URINALYSIS, ROUTINE W REFLEX MICROSCOPIC
BILIRUBIN URINE: NEGATIVE
Glucose, UA: >1000 mg/dL — AB
Ketones, ur: >80 mg/dL — AB
Leukocytes, UA: NEGATIVE
Nitrite: NEGATIVE
PH: 5 (ref 5.0–8.0)
Protein, ur: NEGATIVE mg/dL
SPECIFIC GRAVITY, URINE: 1.019 (ref 1.005–1.030)
UROBILINOGEN UA: 0.2 mg/dL (ref 0.0–1.0)

## 2014-11-13 LAB — CBC WITH DIFFERENTIAL/PLATELET
BASOS ABS: 0 10*3/uL (ref 0.0–0.1)
BASOS PCT: 0 %
EOS ABS: 0 10*3/uL (ref 0.0–0.7)
EOS PCT: 0 %
HCT: 43.5 % (ref 36.0–46.0)
HEMOGLOBIN: 14.1 g/dL (ref 12.0–15.0)
LYMPHS ABS: 1 10*3/uL (ref 0.7–4.0)
Lymphocytes Relative: 5 %
MCH: 29.5 pg (ref 26.0–34.0)
MCHC: 32.4 g/dL (ref 30.0–36.0)
MCV: 91 fL (ref 78.0–100.0)
Monocytes Absolute: 1.4 10*3/uL — ABNORMAL HIGH (ref 0.1–1.0)
Monocytes Relative: 7 %
NEUTROS PCT: 88 %
Neutro Abs: 19 10*3/uL — ABNORMAL HIGH (ref 1.7–7.7)
PLATELETS: 348 10*3/uL (ref 150–400)
RBC: 4.78 MIL/uL (ref 3.87–5.11)
RDW: 13.7 % (ref 11.5–15.5)
WBC: 21.4 10*3/uL — AB (ref 4.0–10.5)

## 2014-11-13 LAB — CBG MONITORING, ED
GLUCOSE-CAPILLARY: 457 mg/dL — AB (ref 65–99)
GLUCOSE-CAPILLARY: 254 mg/dL — AB (ref 65–99)
GLUCOSE-CAPILLARY: 299 mg/dL — AB (ref 65–99)
GLUCOSE-CAPILLARY: 241 mg/dL — AB (ref 65–99)
GLUCOSE-CAPILLARY: 228 mg/dL — AB (ref 65–99)
Glucose-Capillary: 295 mg/dL — ABNORMAL HIGH (ref 65–99)
Glucose-Capillary: 256 mg/dL — ABNORMAL HIGH (ref 65–99)

## 2014-11-13 LAB — GLUCOSE, CAPILLARY
GLUCOSE-CAPILLARY: 208 mg/dL — AB (ref 65–99)
GLUCOSE-CAPILLARY: 156 mg/dL — AB (ref 65–99)
GLUCOSE-CAPILLARY: 139 mg/dL — AB (ref 65–99)
GLUCOSE-CAPILLARY: 129 mg/dL — AB (ref 65–99)
Glucose-Capillary: 183 mg/dL — ABNORMAL HIGH (ref 65–99)
Glucose-Capillary: 125 mg/dL — ABNORMAL HIGH (ref 65–99)

## 2014-11-13 LAB — I-STAT CG4 LACTIC ACID, ED: Lactic Acid, Venous: 1.9 mmol/L (ref 0.5–2.0)

## 2014-11-13 LAB — I-STAT VENOUS BLOOD GAS, ED
Acid-base deficit: 24 mmol/L — ABNORMAL HIGH (ref 0.0–2.0)
BICARBONATE: 5.6 meq/L — AB (ref 20.0–24.0)
O2 Saturation: 60 %
PCO2 VEN: 22.6 mmHg — AB (ref 45.0–50.0)
PO2 VEN: 46 mmHg — AB (ref 30.0–45.0)
TCO2: 6 mmol/L (ref 0–100)
pH, Ven: 7.003 — CL (ref 7.250–7.300)

## 2014-11-13 LAB — LITHIUM LEVEL: Lithium Lvl: 0.19 mmol/L — ABNORMAL LOW (ref 0.60–1.20)

## 2014-11-13 LAB — URINE MICROSCOPIC-ADD ON

## 2014-11-13 LAB — APTT: APTT: 30 s (ref 24–37)

## 2014-11-13 LAB — MAGNESIUM: Magnesium: 1.7 mg/dL (ref 1.7–2.4)

## 2014-11-13 LAB — TECHNOLOGIST SMEAR REVIEW

## 2014-11-13 LAB — I-STAT BETA HCG BLOOD, ED (MC, WL, AP ONLY): I-stat hCG, quantitative: <5 m[IU]/mL (ref <5)

## 2014-11-13 LAB — MRSA PCR SCREENING: MRSA by PCR: NEGATIVE

## 2014-11-13 LAB — PROTIME-INR
INR: 1.37 (ref 0.00–1.49)
Prothrombin Time: 16.9 seconds — ABNORMAL HIGH (ref 11.6–15.2)

## 2014-11-13 LAB — PHOSPHORUS: PHOSPHORUS: 1.6 mg/dL — AB (ref 2.5–4.6)

## 2014-11-13 LAB — BETA-HYDROXYBUTYRIC ACID: Beta-Hydroxybutyric Acid: 5.13 mmol/L — ABNORMAL HIGH (ref 0.05–0.27)

## 2014-11-13 MED ORDER — HEPARIN SODIUM (PORCINE) 5000 UNIT/ML IJ SOLN
5000.0000 [IU] | Freq: Three times a day (TID) | INTRAMUSCULAR | Status: DC
  Administered 2014-11-13 – 2014-11-15 (×7): 5000 [IU] via SUBCUTANEOUS
  Filled 2014-11-13 (×6): qty 1

## 2014-11-13 MED ORDER — POTASSIUM PHOSPHATE MONOBASIC 500 MG PO TABS
500.0000 mg | ORAL_TABLET | Freq: Once | ORAL | Status: DC
  Filled 2014-11-13: qty 1

## 2014-11-13 MED ORDER — SODIUM CHLORIDE 0.9 % IV SOLN: INTRAVENOUS | Status: DC

## 2014-11-13 MED ORDER — POTASSIUM CHLORIDE 10 MEQ/100ML IV SOLN
10.0000 meq | INTRAVENOUS | Status: AC
  Administered 2014-11-13 (×3): 10 meq via INTRAVENOUS
  Filled 2014-11-13 (×3): qty 100

## 2014-11-13 MED ORDER — DEXTROSE 50 % IV SOLN
25.0000 mL | INTRAVENOUS | Status: DC | PRN
  Filled 2014-11-13: qty 50

## 2014-11-13 MED ORDER — POTASSIUM CHLORIDE IN NACL 20-0.9 MEQ/L-% IV SOLN: INTRAVENOUS | Status: DC

## 2014-11-13 MED ORDER — ONDANSETRON HCL 4 MG/2ML IJ SOLN
INTRAMUSCULAR | Status: AC
  Administered 2014-11-13: 4 mg via INTRAVENOUS
  Filled 2014-11-13: qty 2

## 2014-11-13 MED ORDER — IPRATROPIUM-ALBUTEROL 0.5-2.5 (3) MG/3ML IN SOLN: 3.0000 mL | RESPIRATORY_TRACT | Status: DC | PRN

## 2014-11-13 MED ORDER — VANCOMYCIN HCL IN DEXTROSE 750-5 MG/150ML-% IV SOLN
750.0000 mg | Freq: Two times a day (BID) | INTRAVENOUS | Status: DC
  Administered 2014-11-14: 750 mg via INTRAVENOUS
  Filled 2014-11-13 (×2): qty 150

## 2014-11-13 MED ORDER — DEXTROSE 5 % IV SOLN
2.0000 g | Freq: Three times a day (TID) | INTRAVENOUS | Status: DC
  Administered 2014-11-13 – 2014-11-14 (×2): 2 g via INTRAVENOUS
  Filled 2014-11-13 (×3): qty 2

## 2014-11-13 MED ORDER — DEXTROSE-NACL 5-0.45 % IV SOLN
INTRAVENOUS | Status: DC
  Administered 2014-11-13: 12:00:00 via INTRAVENOUS

## 2014-11-13 MED ORDER — FENTANYL CITRATE (PF) 100 MCG/2ML IJ SOLN
100.0000 ug | Freq: Once | INTRAMUSCULAR | Status: AC
  Administered 2014-11-13: 100 ug via INTRAVENOUS
  Filled 2014-11-13: qty 2

## 2014-11-13 MED ORDER — INSULIN GLARGINE 300 UNIT/ML ~~LOC~~ SOPN: 28.0000 [IU] | PEN_INJECTOR | Freq: Every day | SUBCUTANEOUS | Status: DC

## 2014-11-13 MED ORDER — PROMETHAZINE HCL 25 MG/ML IJ SOLN
25.0000 mg | INTRAMUSCULAR | Status: DC | PRN
  Administered 2014-11-15: 25 mg via INTRAVENOUS
  Filled 2014-11-13 (×2): qty 1

## 2014-11-13 MED ORDER — BUDESONIDE 0.5 MG/2ML IN SUSP
0.5000 mg | Freq: Two times a day (BID) | RESPIRATORY_TRACT | Status: DC
  Administered 2014-11-13 – 2014-11-15 (×4): 0.5 mg via RESPIRATORY_TRACT
  Filled 2014-11-13 (×4): qty 2

## 2014-11-13 MED ORDER — ALPRAZOLAM 0.5 MG PO TABS
1.0000 mg | ORAL_TABLET | Freq: Four times a day (QID) | ORAL | Status: DC
  Administered 2014-11-13 – 2014-11-15 (×7): 1 mg via ORAL
  Filled 2014-11-13 (×7): qty 2

## 2014-11-13 MED ORDER — IPRATROPIUM-ALBUTEROL 0.5-2.5 (3) MG/3ML IN SOLN: 3.0000 mL | Freq: Two times a day (BID) | RESPIRATORY_TRACT | Status: DC

## 2014-11-13 MED ORDER — IOHEXOL 300 MG/ML  SOLN
100.0000 mL | Freq: Once | INTRAMUSCULAR | Status: AC | PRN
  Administered 2014-11-13: 100 mL via INTRAVENOUS

## 2014-11-13 MED ORDER — LITHIUM CARBONATE ER 450 MG PO TBCR
450.0000 mg | EXTENDED_RELEASE_TABLET | Freq: Every day | ORAL | Status: DC
  Administered 2014-11-13 – 2014-11-14 (×2): 450 mg via ORAL
  Filled 2014-11-13 (×3): qty 1

## 2014-11-13 MED ORDER — SODIUM CHLORIDE 0.9 % IJ SOLN
3.0000 mL | Freq: Two times a day (BID) | INTRAMUSCULAR | Status: DC
  Administered 2014-11-13 – 2014-11-14 (×4): 3 mL via INTRAVENOUS

## 2014-11-13 MED ORDER — INSULIN REGULAR BOLUS VIA INFUSION
0.0000 [IU] | Freq: Three times a day (TID) | INTRAVENOUS | Status: DC
  Filled 2014-11-13: qty 10

## 2014-11-13 MED ORDER — ONDANSETRON HCL 4 MG/2ML IJ SOLN
INTRAMUSCULAR | Status: AC
  Administered 2014-11-13: 19:00:00
  Filled 2014-11-13: qty 2

## 2014-11-13 MED ORDER — SODIUM CHLORIDE 0.9 % IV BOLUS (SEPSIS)
2000.0000 mL | Freq: Once | INTRAVENOUS | Status: AC
  Administered 2014-11-13: 2000 mL via INTRAVENOUS

## 2014-11-13 MED ORDER — POTASSIUM CHLORIDE 10 MEQ/100ML IV SOLN
10.0000 meq | INTRAVENOUS | Status: AC
  Administered 2014-11-13 (×4): 10 meq via INTRAVENOUS
  Filled 2014-11-13 (×3): qty 100

## 2014-11-13 MED ORDER — INSULIN ASPART 100 UNIT/ML ~~LOC~~ SOLN
8.0000 [IU] | Freq: Once | SUBCUTANEOUS | Status: AC
  Administered 2014-11-13: 8 [IU] via INTRAVENOUS

## 2014-11-13 MED ORDER — INSULIN ASPART 100 UNIT/ML ~~LOC~~ SOLN
8.0000 [IU] | Freq: Once | SUBCUTANEOUS | Status: DC
  Filled 2014-11-13: qty 1

## 2014-11-13 MED ORDER — MOMETASONE FUROATE 220 MCG/INH IN AEPB: 2.0000 | INHALATION_SPRAY | Freq: Two times a day (BID) | RESPIRATORY_TRACT | Status: DC

## 2014-11-13 MED ORDER — INSULIN GLARGINE 100 UNIT/ML ~~LOC~~ SOLN
28.0000 [IU] | Freq: Every day | SUBCUTANEOUS | Status: DC
  Administered 2014-11-13 – 2014-11-14 (×2): 28 [IU] via SUBCUTANEOUS
  Filled 2014-11-13 (×3): qty 0.28

## 2014-11-13 MED ORDER — ONDANSETRON HCL 4 MG/2ML IJ SOLN: 4.0000 mg | Freq: Four times a day (QID) | INTRAMUSCULAR | Status: DC | PRN

## 2014-11-13 MED ORDER — SODIUM CHLORIDE 0.9 % IV SOLN
INTRAVENOUS | Status: DC
  Filled 2014-11-13: qty 2.5

## 2014-11-13 MED ORDER — BUPROPION HCL ER (XL) 150 MG PO TB24
450.0000 mg | ORAL_TABLET | Freq: Every day | ORAL | Status: DC
  Administered 2014-11-13 – 2014-11-15 (×3): 450 mg via ORAL
  Filled 2014-11-13 (×2): qty 3
  Filled 2014-11-13 (×2): qty 1

## 2014-11-13 MED ORDER — INSULIN ASPART 100 UNIT/ML ~~LOC~~ SOLN
0.0000 [IU] | Freq: Three times a day (TID) | SUBCUTANEOUS | Status: DC
Start: 2014-11-14 — End: 2014-11-15
  Administered 2014-11-14: 1 [IU] via SUBCUTANEOUS
  Administered 2014-11-14 (×2): 2 [IU] via SUBCUTANEOUS
  Administered 2014-11-15 (×2): 1 [IU] via SUBCUTANEOUS

## 2014-11-13 MED ORDER — MONTELUKAST SODIUM 10 MG PO TABS
10.0000 mg | ORAL_TABLET | Freq: Every day | ORAL | Status: DC
  Administered 2014-11-13 – 2014-11-14 (×2): 10 mg via ORAL
  Filled 2014-11-13 (×2): qty 1

## 2014-11-13 MED ORDER — K PHOS MONO-SOD PHOS DI & MONO 155-852-130 MG PO TABS
500.0000 mg | ORAL_TABLET | Freq: Once | ORAL | Status: AC
  Administered 2014-11-14: 500 mg via ORAL
  Filled 2014-11-13: qty 2

## 2014-11-13 MED ORDER — SENNOSIDES-DOCUSATE SODIUM 8.6-50 MG PO TABS
1.0000 | ORAL_TABLET | Freq: Two times a day (BID) | ORAL | Status: DC
  Administered 2014-11-14 – 2014-11-15 (×3): 1 via ORAL
  Filled 2014-11-13 (×3): qty 1

## 2014-11-13 MED ORDER — VANCOMYCIN HCL IN DEXTROSE 1-5 GM/200ML-% IV SOLN
1000.0000 mg | Freq: Once | INTRAVENOUS | Status: AC
  Administered 2014-11-13: 1000 mg via INTRAVENOUS
  Filled 2014-11-13: qty 200

## 2014-11-13 MED ORDER — DEXTROSE 5 % IV SOLN
2.0000 g | Freq: Once | INTRAVENOUS | Status: AC
  Administered 2014-11-13: 2 g via INTRAVENOUS
  Filled 2014-11-13: qty 2

## 2014-11-13 MED ORDER — POTASSIUM CHLORIDE IN NACL 20-0.9 MEQ/L-% IV SOLN
Freq: Once | INTRAVENOUS | Status: AC
  Administered 2014-11-13: 11:00:00 via INTRAVENOUS
  Filled 2014-11-13: qty 1000

## 2014-11-13 MED ORDER — SODIUM CHLORIDE 0.9 % IV SOLN
INTRAVENOUS | Status: DC
  Administered 2014-11-13: 2.4 [IU]/h via INTRAVENOUS
  Filled 2014-11-13: qty 2.5

## 2014-11-13 MED ORDER — ONDANSETRON HCL 4 MG/2ML IJ SOLN
4.0000 mg | Freq: Once | INTRAMUSCULAR | Status: AC
  Administered 2014-11-13: 4 mg via INTRAVENOUS

## 2014-11-13 NOTE — H&P (Signed)
Date: 11/13/2014               Patient Name:  Melody Myers MRN: [id number]  DOB: [birth year] Age / Sex: 34 y.o., female   PCP: No Pcp Per Patient         Medical Service: Internal Medicine Teaching Service         Attending Physician: Dr. Tyson Alias, MD    First Contact: Dr. Karma Greaser Pager: [phone]  Second Contact: Dr. Johna Roles Pager: [phone]       After Hours (After 5p/  First Contact Pager: [phone]  weekends / holidays): Second Contact Pager: [phone]   Chief Complaint: Elevated blood sugars  History of Present Illness: Melody Myers is a 34 year old female with a PMH of DM type 1 with neuropathy, Asthma, Anxiety, Depression, Bipolar disorder presents to [facility] ED with nausea, vomiting, increased somnolence. Found to have a BG level of 435. Patient was somnolent and groggy, most of the history was obtained from her husband at the bedside. 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 She went back the following day because of pain and nausea. Her pain medicine was increased from Norco 5 mg to 10 mg and was also started on Flagyl at that time. She reports that her last A1c was in the 7s and is well controlled on Toujeo 28 units daily and carb counting sliding scale (meter calculates the dose for her) though the husband reports her sugars are usually in the 200s. After the surgery she was told to cut her Toujeo dose in half as she was not eating as much and has been only taking 15 units daily. She has not been eating much the past 2-3 days with increasing somnolence and N/V. Reports polyuria and polydipsia. Has been having chills but no fevers. Denies any abdominal pain, diarrhea, SOB or chest pain.  She has had a foley cath in place since the surgery. Denies any dysuria.   Abdominal CT with no acute inflammatory process. Tiny amount of vaginal air and tiny amount of air anterior anal wall probably postsurgical. No  evidence of perianal abscess or hematoma.  CXR with no acute abnormality.   Meds: Current Facility-Administered Medications  Medication Dose Route Frequency Provider Last Rate Last Dose  . cefTAZidime (FORTAZ) 2 g in dextrose 5 % 50 mL IVPB  2 g Intravenous 3 times per day Sallee Provencal, RPH      . dextrose 5 %-0.45 % sodium chloride infusion   Intravenous Continuous Marily Memos, MD 100 mL/hr at 11/13/14 1226    . dextrose 50 % solution 25 mL  25 mL Intravenous PRN Marily Memos, MD      . heparin injection 5,000 Units  5,000 Units Subcutaneous 3 times per day Otis Brace, MD   5,000 Units at 11/13/14 1627  . insulin regular (NOVOLIN R,HUMULIN R) 250 Units in sodium chloride 0.9 % 250 mL (1 Units/mL) infusion   Intravenous Continuous Marily Memos, MD 10.1 mL/hr at 11/13/14 1729 10.1 Units/hr at 11/13/14 1729  . insulin regular bolus via infusion 0-10 Units  0-10 Units Intravenous TID WC Jason Mesner, MD      . potassium chloride 10 mEq in 100 mL IVPB  10 mEq Intravenous Q1 Hr x 4 Marjan Rabbani, MD 100 mL/hr at 11/13/14 1727 10 mEq at 11/13/14 1727  . [START ON 11/14/2014] potassium phosphate (monobasic) (K-PHOS ORIGINAL) tablet 500 mg  500 mg Oral  Once Otis Brace, MD      . promethazine (PHENERGAN) injection 25 mg  25 mg Intravenous Q4H PRN Otis Brace, MD      . Melene Muller ON 11/14/2014] senna-docusate (Senokot-S) tablet 1 tablet  1 tablet Oral BID Marjan Rabbani, MD      . sodium chloride 0.9 % injection 3 mL  3 mL Intravenous Q12H Marjan Rabbani, MD   3 mL at 11/13/14 1627  . [START ON 11/14/2014] vancomycin (VANCOCIN) IVPB 750 mg/150 ml premix  750 mg Intravenous Q12H Sallee Provencal, [facility]       Current Outpatient Prescriptions  Medication Sig Dispense Refill  . acetaminophen (TYLENOL) 325 MG tablet Take 650 mg by mouth every 6 (six) hours as needed.    . ALPRAZolam (XANAX) 1 MG tablet Take 1 mg by mouth 4 (four) times daily.     . BELSOMRA 15 MG TABS Take 1 tablet by mouth at  bedtime as needed. for sleep  0  . buPROPion (WELLBUTRIN XL) 150 MG 24 hr tablet Take 450 mg by mouth daily.    Marland Kitchen esomeprazole (NEXIUM) 20 MG capsule Take 20 mg by mouth 2 (two) times daily before a meal.     . gabapentin (NEURONTIN) 300 MG capsule Take 300 mg by mouth 3 (three) times daily.  0  . HYDROcodone-acetaminophen (NORCO) 5-325 MG per tablet Take 1 tablet by mouth every 6 (six) hours as needed for moderate pain.    Marland Kitchen ibuprofen (ADVIL,MOTRIN) 200 MG tablet Take 800 mg by mouth every 6 (six) hours as needed.    . insulin aspart (NOVOLOG) 100 UNIT/ML injection Inject 2-10 Units into the skin 3 (three) times daily before meals.    Marland Kitchen ipratropium-albuterol (DUONEB) 0.5-2.5 (3) MG/3ML SOLN Take 3 mLs by nebulization 2 (two) times daily.     Marland Kitchen lithium carbonate (ESKALITH) 450 MG CR tablet Take 450 mg by mouth at bedtime.     . metroNIDAZOLE (FLAGYL) 250 MG tablet Take 250 mg by mouth 3 (three) times daily. For 7 days. Started on 11-11-14    . mometasone (ASMANEX) 220 MCG/INH inhaler Inhale 2 puffs into the lungs 2 (two) times daily.    . montelukast (SINGULAIR) 10 MG tablet Take 10 mg by mouth at bedtime.    . Multiple Vitamin (MULTIVITAMIN WITH MINERALS) TABS Take 1 tablet by mouth daily.    . naproxen (NAPROSYN) 500 MG tablet Take 1 tablet (500 mg total) by mouth 2 (two) times daily with a meal. 30 tablet 0  . ondansetron (ZOFRAN ODT) 4 MG disintegrating tablet Take 1 tablet (4 mg total) by mouth every 8 (eight) hours as needed for nausea. 10 tablet 0  . Insulin Glargine (TOUJEO SOLOSTAR) 300 UNIT/ML SOPN Inject 28 Units into the skin daily.    . ondansetron (ZOFRAN) 4 MG tablet Take 4 mg by mouth every 8 (eight) hours as needed for nausea or vomiting.      Allergies: Allergies as of 11/13/2014 - Review Complete 11/13/2014  Allergen Reaction Noted  . Amoxicillin Itching 11/01/2011  . Hydrocodone Hives and Nausea And Vomiting 11/24/2013  . Latex Hives 11/01/2011  . Penicillins Itching  11/01/2011  . Prednisone Itching and Swelling 09/04/2013   Past Medical History  Diagnosis Date  . Insulin dependent diabetes mellitus with complications     insulin dependant  . Asthma   . Anxiety   . Diabetic neuropathy   . Depression     patient reports bipolor  . Bipolar 1 disorder   .  Depression   . Syncopal episodes    Past Surgical History  Procedure Laterality Date  . Tubal ligation     No family history on file. Social History   Social History  . Marital Status: Married    Spouse Name: N/A  . Number of Children: N/A  . Years of Education: N/A   Occupational History  . Not on file.   Social History Main Topics  . Smoking status: Current Some Day Smoker -- 0.25 packs/day    Types: Cigarettes  . Smokeless tobacco: Not on file  . Alcohol Use: Yes     Comment: occasional  . Drug Use: Not on file  . Sexual Activity: Yes     Comment: tubal ligation   Other Topics Concern  . Not on file   Social History Narrative    Review of Systems: Review of Systems  Constitutional: Negative for fever and chills.  Eyes: Negative for blurred vision and double vision.  Respiratory: Positive for wheezing. Negative for cough and shortness of breath.   Cardiovascular: Negative for chest pain and palpitations.  Gastrointestinal: Positive for nausea and vomiting. Negative for abdominal pain, diarrhea, constipation and blood in stool.  Genitourinary: Positive for frequency. Negative for dysuria.  Skin: Negative for rash.  Neurological: Positive for headaches. Negative for dizziness.  Endo/Heme/Allergies: Positive for polydipsia.   Physical Exam: Blood pressure 106/55, pulse 94, temperature 98.3 F (36.8 C), temperature source Oral, resp. rate 19, height 5\' 3"  (1.6 m), weight 128 lb (58.06 kg), SpO2 100 %.   GENERAL- alert, not in any distress. HEENT- Atraumatic, normocephalic, PERRL, EOMI, oral mucosa appears dry CARDIAC- tachycardic with normal rhythm, no murmurs, rubs  or gallops. RESP- Moving equal volumes of air, and clear to auscultation bilaterally, no wheezes or crackles. ABDOMEN- Soft, nontender, no guarding or rebound, no palpable masses or organomegaly, bowel sounds present. NEURO- No obvious Cr N abnormality, strenght upper and lower extremities- 5/5, AAO x 3 EXTREMITIES- pulse 2+, symmetric, no pedal edema. SKIN- Warm, dry, No rash or lesion.  Lab results: Basic Metabolic Panel:  Recent Labs  16/10/96 1000 11/13/14 1032 11/13/14 1616  NA 130* 132* 132*  K 4.6 4.5 4.0  CL 101 106 110  CO2 6*  --  8*  GLUCOSE 438* 453* 299*  BUN 15 19 11   CREATININE 1.41* 0.80 1.10*  CALCIUM 9.6  --  8.0*  MG  --   --  1.7  PHOS  --   --  1.6*   Liver Function Tests:  Recent Labs  11/13/14 1000  AST 14*  ALT 13*  ALKPHOS 94  BILITOT 1.1  PROT 7.7  ALBUMIN 4.2   No results for input(s): LIPASE, AMYLASE in the last 72 hours. No results for input(s): AMMONIA in the last 72 hours. CBC:  Recent Labs  11/13/14 1000 11/13/14 1032  WBC 21.4*  --   NEUTROABS 19.0*  --   HGB 14.1 16.3*  HCT 43.5 48.0*  MCV 91.0  --   PLT 348  --    Cardiac Enzymes: No results for input(s): CKTOTAL, CKMB, CKMBINDEX, TROPONINI in the last 72 hours. BNP: No results for input(s): PROBNP in the last 72 hours. D-Dimer: No results for input(s): DDIMER in the last 72 hours. CBG:  Recent Labs  11/13/14 0931 11/13/14 1109 11/13/14 1221 11/13/14 1333 11/13/14 1511 11/13/14 1622  GLUCAP 457* 295* 254* 256* 299* 241*   Hemoglobin A1C: No results for input(s): HGBA1C in the last 72 hours. Fasting  Lipid Panel: No results for input(s): CHOL, HDL, LDLCALC, TRIG, CHOLHDL, LDLDIRECT in the last 72 hours. Thyroid Function Tests: No results for input(s): TSH, T4TOTAL, FREET4, T3FREE, THYROIDAB in the last 72 hours. Anemia Panel: No results for input(s): VITAMINB12, FOLATE, FERRITIN, TIBC, IRON, RETICCTPCT in the last 72 hours. Coagulation:  Recent Labs   11/13/14 1616  LABPROT 16.9*  INR 1.37   Urine Drug Screen: Drugs of Abuse  No results found for: LABOPIA, COCAINSCRNUR, LABBENZ, AMPHETMU, THCU, LABBARB  Alcohol Level: No results for input(s): ETH in the last 72 hours. Urinalysis:  Recent Labs  11/13/14 1026  COLORURINE YELLOW  LABSPEC 1.019  PHURINE 5.0  GLUCOSEU >1000*  HGBUR MODERATE*  BILIRUBINUR NEGATIVE  KETONESUR >80*  PROTEINUR NEGATIVE  UROBILINOGEN 0.2  NITRITE NEGATIVE  LEUKOCYTESUR NEGATIVE   Imaging results:  Ct Abdomen Pelvis W Contrast  11/13/2014   CLINICAL DATA:  Recent anal vaginal reconstructive surgery for fistula repair on Monday, postop pain in nausea  EXAM: CT ABDOMEN AND PELVIS WITH CONTRAST  TECHNIQUE: Multidetector CT imaging of the abdomen and pelvis was performed using the standard protocol following bolus administration of intravenous contrast.  CONTRAST:  OMNIPAQUE IOHEXOL 300 MG/ML  SOLN  COMPARISON:  None.  FINDINGS: Lung bases are unremarkable. Mild fatty infiltration of the liver. No focal hepatic mass. Sagittal images of the spine are unremarkable. The pancreas, spleen and adrenal glands are unremarkable. Kidneys are symmetrical in size and enhancement. No aortic aneurysm. No hydronephrosis or hydroureter.  No small bowel obstruction. Normal appendix. No pericecal inflammation. The terminal ileum is unremarkable.  No distal colonic obstruction. No colitis or diverticulitis. There is a Foley catheter within urinary bladder. The uterus is anteflexed. A left ovarian cyst is noted measures 3 cm. Tiny amount of air is noted within lower vagina and probable postsurgical in nature. Tiny amount of air is noted anterior anal wall probable postsurgical in nature. There is no evidence of pelvic hematoma. No subcutaneous abscess or hematoma.  IMPRESSION: 1. There is no evidence of acute inflammatory process within abdomen. 2. Mild fatty infiltration of the liver. 3. No hydronephrosis or hydroureter. 4.  Normal appendix.  No pericecal inflammation. 5. There is a left ovarian cyst measures 3 cm. 6. Tiny amount of vaginal air and tiny amount of air anterior anal wall probable postsurgical in nature. No evidence of perianal abscess or hematoma. 7. Unremarkable uterus. 8. There is a Foley catheter within urinary bladder. No bladder hematoma.   Electronically Signed   By: Natasha Mead M.D.   On: 11/13/2014 11:59   Dg Chest Port 1 View  11/13/2014   CLINICAL DATA:  Severe nausea vomiting, and pelvic pain, the patient is status post anal vaginal fistula repair.  EXAM: PORTABLE CHEST - 1 VIEW  COMPARISON:  07/28/2014  FINDINGS: Cardiomediastinal silhouette is normal. Mediastinal contours appear intact.  There is no evidence of focal airspace consolidation, pleural effusion or pneumothorax.  Osseous structures are without acute abnormality. Soft tissues are grossly normal.  IMPRESSION: No radiographic evidence of acute cardiopulmonary abnormality.   Electronically Signed   By: Ted Mcalpine M.D.   On: 11/13/2014 10:42    Other results: EKG: Sinus tachycardia. Right Atrial Enlargement.   Assessment & Plan by Problem: Principal Problem:   DKA (diabetic ketoacidoses) Active Problems:   Asthma   Anxiety   Bipolar depression   Tobacco abuse   SIRS (systemic inflammatory response syndrome)   AKI (acute kidney injury)  Mrs. Pilson is a 34  year old female with Type 1 DM who presents with several day history of N/V and increasing somnolence following perineal repair surgery last week.   DKA with SIRS: Patient with poor PO intake for the past week following surgery on 9/12 with N/V the past several days and increasing somnolence. She is taking half her normal dose of Touejo. CBG on arrival to ED was 457. Venous blood gas with a pH of 7.003, pCO2 22.6, pO2 46, bicarb 5.6. AG of 23. UA with ketones and glucose. She was given 2L NS in the ED with 30 mEq of KCl. BG has responded well to the insulin drip and  latest CBG was 241 with gap improved to 14. She was tachycardic, tachypneic with a WBC of 21.4. K was 4.6. No obvious source of infection. CXR was normal, Abdominal CT with no signs of infection, UA only significant for ketones. Lactate normal at 1.9. Tachycardia and tachypnea improving with fluid resuscitation. Blood cultures drawn and she was started on Ceftaz and Vanc (has a penicillin allergy). No recent steroid use. She is ~1 week post-op so unlikely related to surgery. Tachycardia and tachypnea likely 2/2 dehydration. WBC unexplained at this point.  - NPO - Continue Insulin drip  - D5 1/2 NS at 100 mL/hr  - KCl 40 mEq IV - BMP q2hr  - phenergan 25 mg IV q4hr prn - CBC in AM - Continue Vanc and Ceftaz per pharmacy - Will restart home Toujeo 28 units daily and SSI-s once gap closed x2 - Check A1c - Mg, Phos - HIV  - UDS  AKI: Cr elevated to 1.41 on arrival. Improving with IVF to 1.10 this afternoon. Likely pre-renal 2/2 dehydration from poor PO intake. Will continue to give IVF and monitor.  - Holding home dose gabapentin  Bipolar Depression: On lithium carbonate 450 mg qhs, bupropion 150 mg at home.  - Continue home meds  Anxiety: On xanax 1 mg qid.  - Continue home meds  Asthma: No respiratory symptoms and no wheezing on exam. She is on Asmanex inhaler 2 puffs bid, duonebs bid, and montelukast 10 mg daily.  - Continue Asmanex and montelukast - Duonebs q4hr prn  Tobacco Abuse: Reports infrequent smoking, 1 cigarette every other day. Has smoke for the past 17 years but only 1-2 cigarettes a day. Encouraged to quit.   FEN: NPO, resume diet once gap has close  DVT PPx: Heparin 5000 units SQ q8hr  Dispo: Disposition is deferred at this time, awaiting improvement of current medical problems. Anticipated discharge in approximately 1-3 day(s).   The patient does not have a current PCP (No Pcp Per Patient) and does need an [facility] hospital follow-up appointment after discharge.  The  patient does not have transportation limitations that hinder transportation to clinic appointments.  Signed: Valentino Nose, MD 11/13/2014, 5:30 PM

## 2014-11-13 NOTE — ED Provider Notes (Signed)
CSN: [id number]     Arrival date & time 11/13/14  1027 History   First MD Initiated Contact with Patient 11/13/14 [phone]     Chief Complaint  Patient presents with  . Post-op Problem  . Hyperglycemia  . Tachycardia     (Consider location/radiation/quality/duration/timing/severity/associated sxs/prior Treatment) Patient is a 34 y.o. female presenting with hyperglycemia.  Hyperglycemia Blood sugar level PTA:  'high' Severity:  Mild Onset quality:  Gradual Duration:  2 days Timing:  Constant Progression:  Worsening Chronicity:  Recurrent Diabetes status:  Controlled with insulin Context: not change in medication, not noncompliance, not recent change in diet and not recent illness   Relieved by:  None tried Ineffective treatments:  None tried Associated symptoms: abdominal pain, fever, nausea, shortness of breath and vomiting   Associated symptoms: no dizziness, no increased thirst and no polyuria     Past Medical History  Diagnosis Date  . Insulin dependent diabetes mellitus with complications     insulin dependant  . Asthma   . Anxiety   . Diabetic neuropathy   . Depression     patient reports bipolor  . Bipolar 1 disorder   . Depression   . Syncopal episodes    Past Surgical History  Procedure Laterality Date  . Tubal ligation     No family history on file. Social History  Substance Use Topics  . Smoking status: Current Some Day Smoker -- 0.25 packs/day    Types: Cigarettes  . Smokeless tobacco: None  . Alcohol Use: Yes     Comment: occasional   OB History    No data available     Review of Systems  Constitutional: Positive for fever.  HENT: Negative for congestion.   Eyes: Negative for pain and redness.  Respiratory: Positive for shortness of breath.   Gastrointestinal: Positive for nausea, vomiting and abdominal pain.  Endocrine: Negative for polydipsia and polyuria.  Musculoskeletal: Negative for back pain and neck pain.  Neurological: Negative for  dizziness and headaches.  All other systems reviewed and are negative.     Allergies  Amoxicillin; Hydrocodone; Latex; Penicillins; and Prednisone  Home Medications   Prior to Admission medications   Medication Sig Start Date End Date Taking? Authorizing Provider  acetaminophen (TYLENOL) 325 MG tablet Take 650 mg by mouth every 6 (six) hours as needed.   Yes Historical Provider, MD  ALPRAZolam Prudy Feeler) 1 MG tablet Take 1 mg by mouth 4 (four) times daily.    Yes Historical Provider, MD  BELSOMRA 15 MG TABS Take 1 tablet by mouth at bedtime as needed. for sleep 09/06/14  Yes Historical Provider, MD  buPROPion (WELLBUTRIN XL) 150 MG 24 hr tablet Take 450 mg by mouth daily.   Yes Historical Provider, MD  esomeprazole (NEXIUM) 20 MG capsule Take 20 mg by mouth 2 (two) times daily before a meal.    Yes Historical Provider, MD  gabapentin (NEURONTIN) 300 MG capsule Take 300 mg by mouth 3 (three) times daily. 08/04/14  Yes Historical Provider, MD  HYDROcodone-acetaminophen (NORCO) 5-325 MG per tablet Take 1 tablet by mouth every 6 (six) hours as needed for moderate pain.   Yes Historical Provider, MD  ibuprofen (ADVIL,MOTRIN) 200 MG tablet Take 800 mg by mouth every 6 (six) hours as needed.   Yes Historical Provider, MD  insulin aspart (NOVOLOG) 100 UNIT/ML injection Inject 2-10 Units into the skin 3 (three) times daily before meals.   Yes Historical Provider, MD  ipratropium-albuterol (DUONEB) 0.5-2.5 (3) MG/3ML SOLN  Take 3 mLs by nebulization 2 (two) times daily.    Yes Historical Provider, MD  lithium carbonate (ESKALITH) 450 MG CR tablet Take 450 mg by mouth at bedtime.    Yes Historical Provider, MD  metroNIDAZOLE (FLAGYL) 250 MG tablet Take 250 mg by mouth 3 (three) times daily. For 7 days. Started on 11-11-14   Yes Historical Provider, MD  mometasone [facility]) 220 MCG/INH inhaler Inhale 2 puffs into the lungs 2 (two) times daily.   Yes Historical Provider, MD  montelukast (SINGULAIR) 10 MG tablet  Take 10 mg by mouth at bedtime.   Yes Historical Provider, MD  Multiple Vitamin (MULTIVITAMIN WITH MINERALS) TABS Take 1 tablet by mouth daily.   Yes Historical Provider, MD  naproxen (NAPROSYN) 500 MG tablet Take 1 tablet (500 mg total) by mouth 2 (two) times daily with a meal. 09/16/14  Yes Eber Hong, MD  ondansetron (ZOFRAN ODT) 4 MG disintegrating tablet Take 1 tablet (4 mg total) by mouth every 8 (eight) hours as needed for nausea. 09/16/14  Yes Eber Hong, MD  Insulin Glargine (TOUJEO SOLOSTAR) 300 UNIT/ML SOPN Inject 28 Units into the skin daily.    Historical Provider, MD  ondansetron (ZOFRAN) 4 MG tablet Take 4 mg by mouth every 8 (eight) hours as needed for nausea or vomiting.    Historical Provider, MD   BP 129/68 mmHg  Pulse 109  Temp(Src) 98.3 F (36.8 C) (Oral)  Resp 21  Ht  (1.6 m)  Wt 128 lb (58.06 kg)  BMI 22.68 kg/m2  SpO2 100%  LMP  (LMP Unknown) Physical Exam  Constitutional: She is oriented to person, place, and time. She appears well-developed and well-nourished.  HENT:  Head: Normocephalic and atraumatic.  Eyes: Conjunctivae and EOM are normal. Right eye exhibits no discharge. Left eye exhibits no discharge.  Cardiovascular: Regular rhythm.  Tachycardia present.   Pulmonary/Chest: Effort normal and breath sounds normal. Tachypnea noted. No respiratory distress. She has no decreased breath sounds. She has no wheezes.  Abdominal: Soft. She exhibits no distension. There is no tenderness. There is no rebound.  Genitourinary:  No erythema, purulent drainage or malodorous material in perineal area.   Musculoskeletal: Normal range of motion. She exhibits no edema or tenderness.  Neurological: She is alert and oriented to person, place, and time.  Skin: Skin is warm and dry. No rash noted. No erythema. There is pallor (lips).  Nursing note and vitals reviewed.   ED Course  Procedures (including critical care time)  CRITICAL CARE Performed by: Marily Memos   Total critical care time: 45 minutes  Critical care time was exclusive of separately billable procedures and treating other patients.  Critical care was necessary to treat or prevent imminent or life-threatening deterioration.  Critical care was time spent personally by me on the following activities: development of treatment plan with patient and/or surrogate as well as nursing, discussions with consultants, evaluation of patient's response to treatment, examination of patient, obtaining history from patient or surrogate, ordering and performing treatments and interventions, ordering and review of laboratory studies, ordering and review of radiographic studies, pulse oximetry and re-evaluation of patient's condition.   Labs Review Labs Reviewed  CBC WITH DIFFERENTIAL/PLATELET - Abnormal; Notable for the following:    WBC 21.4 (*)    Neutro Abs 19.0 (*)    Monocytes Absolute 1.4 (*)    All other components within normal limits  COMPREHENSIVE METABOLIC PANEL - Abnormal; Notable for the following:    Sodium  130 (*)    CO2 6 (*)    Glucose, Bld 438 (*)    Creatinine, Ser 1.41 (*)    AST 14 (*)    ALT 13 (*)    GFR calc non Af Amer 49 (*)    GFR calc Af Amer 56 (*)    Anion gap 23 (*)    All other components within normal limits  URINALYSIS, ROUTINE W REFLEX MICROSCOPIC (NOT AT [facility]) - Abnormal; Notable for the following:    Glucose, UA >1000 (*)    Hgb urine dipstick MODERATE (*)    Ketones, ur >80 (*)    All other components within normal limits  CBG MONITORING, ED - Abnormal; Notable for the following:    Glucose-Capillary 457 (*)    All other components within normal limits  I-STAT CHEM 8, ED - Abnormal; Notable for the following:    Sodium 132 (*)    Glucose, Bld 453 (*)    Calcium, Ion 1.29 (*)    Hemoglobin 16.3 (*)    HCT 48.0 (*)    All other components within normal limits  I-STAT VENOUS BLOOD GAS, ED - Abnormal; Notable for the following:    pH, Ven 7.003  (*)    pCO2, Ven 22.6 (*)    pO2, Ven 46.0 (*)    Bicarbonate 5.6 (*)    Acid-base deficit 24.0 (*)    All other components within normal limits  CBG MONITORING, ED - Abnormal; Notable for the following:    Glucose-Capillary 295 (*)    All other components within normal limits  CBG MONITORING, ED - Abnormal; Notable for the following:    Glucose-Capillary 254 (*)    All other components within normal limits  CBG MONITORING, ED - Abnormal; Notable for the following:    Glucose-Capillary 256 (*)    All other components within normal limits  CULTURE, BLOOD (ROUTINE X 2)  CULTURE, BLOOD (ROUTINE X 2)  BLOOD GAS, VENOUS  URINE MICROSCOPIC-ADD ON  BASIC METABOLIC PANEL  BASIC METABOLIC PANEL  BASIC METABOLIC PANEL  BASIC METABOLIC PANEL  MAGNESIUM  HEMOGLOBIN A1C  BETA-HYDROXYBUTYRIC ACID  HIV ANTIBODY (ROUTINE TESTING)  TECHNOLOGIST SMEAR REVIEW  PROTIME-INR  APTT  PHOSPHORUS  URINE RAPID DRUG SCREEN, HOSP PERFORMED  I-STAT CG4 LACTIC ACID, ED  I-STAT BETA HCG BLOOD, ED ([facility], [facility], [facility] ONLY)    Imaging Review Ct Abdomen Pelvis W Contrast  11/13/2014   CLINICAL DATA:  Recent anal vaginal reconstructive surgery for fistula repair on Monday, postop pain in nausea  EXAM: CT ABDOMEN AND PELVIS WITH CONTRAST  TECHNIQUE: Multidetector CT imaging of the abdomen and pelvis was performed using the standard protocol following bolus administration of intravenous contrast.  CONTRAST:  OMNIPAQUE IOHEXOL 300 MG/ML  SOLN  COMPARISON:  None.  FINDINGS: Lung bases are unremarkable. Mild fatty infiltration of the liver. No focal hepatic mass. Sagittal images of the spine are unremarkable. The pancreas, spleen and adrenal glands are unremarkable. Kidneys are symmetrical in size and enhancement. No aortic aneurysm. No hydronephrosis or hydroureter.  No small bowel obstruction. Normal appendix. No pericecal inflammation. The terminal ileum is unremarkable.  No distal colonic obstruction. No colitis or  diverticulitis. There is a Foley catheter within urinary bladder. The uterus is anteflexed. A left ovarian cyst is noted measures 3 cm. Tiny amount of air is noted within lower vagina and probable postsurgical in nature. Tiny amount of air is noted anterior anal wall probable postsurgical in nature. There is  no evidence of pelvic hematoma. No subcutaneous abscess or hematoma.  IMPRESSION: 1. There is no evidence of acute inflammatory process within abdomen. 2. Mild fatty infiltration of the liver. 3. No hydronephrosis or hydroureter. 4. Normal appendix.  No pericecal inflammation. 5. There is a left ovarian cyst measures 3 cm. 6. Tiny amount of vaginal air and tiny amount of air anterior anal wall probable postsurgical in nature. No evidence of perianal abscess or hematoma. 7. Unremarkable uterus. 8. There is a Foley catheter within urinary bladder. No bladder hematoma.   Electronically Signed   By: Natasha Mead M.D.   On: 11/13/2014 11:59   Dg Chest Port 1 View  11/13/2014   CLINICAL DATA:  Severe nausea vomiting, and pelvic pain, the patient is status post anal vaginal fistula repair.  EXAM: PORTABLE CHEST - 1 VIEW  COMPARISON:  07/28/2014  FINDINGS: Cardiomediastinal silhouette is normal. Mediastinal contours appear intact.  There is no evidence of focal airspace consolidation, pleural effusion or pneumothorax.  Osseous structures are without acute abnormality. Soft tissues are grossly normal.  IMPRESSION: No radiographic evidence of acute cardiopulmonary abnormality.   Electronically Signed   By: Ted Mcalpine M.D.   On: 11/13/2014 10:42   I have personally reviewed and evaluated these images and lab results as part of my medical decision-making.   EKG Interpretation   Date/Time:  Sunday November 13 2014 09:29:19 EDT Ventricular Rate:  114 PR Interval:  141 QRS Duration: 96 QT Interval:  328 QTC Calculation: 452 R Axis:   51 Text Interpretation:  Sinus tachycardia Right atrial enlargement  Consider  right ventricular hypertrophy Reconfirmed by [facility] MD, Barbara Cower [phone]) on  11/13/2014 10:15:36 AM      MDM   Final diagnoses:  Insulin dependent diabetes mellitus with complications  Leukocytosis  Diabetic ketoacidosis without coma associated with type 1 diabetes mellitus   34 year old female history of type 1 diabetes presents to the emergency department in DKA likely secondary to medication noncompliance. Patient recently had a surgery for vaginal and anal reconstruction so I did a CT scan to make sure she have any infections associated with that. It was negative. Initially patient was to Make an hyperglycemic and tachycardic so I started fluids and gave her a bolus of insulin to help with her hyperglycemia. Once her pH came back at 7.0 and an initiated potassium 4.5 we started normal saline with potassium infusion and the insulin drip. Patient put on glucose stabilizing protocol and admitted to stepdown. Prior to admission patient glucose improved to 250's. Switched to D5 1/2 NaCl and runs of potassium.       Marily Memos, MD 11/13/14 [phone]

## 2014-11-13 NOTE — ED Notes (Signed)
hospitalist at bedside

## 2014-11-13 NOTE — ED Notes (Addendum)
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  Went to [facility] on Tuesday because of pain and nausea, was discharged.  Called EMS again to day for pain in post-op area, as well as severe nausea and vomiting.  Was not able to keep medicine down today.  CBG 435 with EMS, IV established by them, 1 liter of nacl given en route.  Allergic to penicillin, amoxicillin, and prednisone.   At this facility, CBG was 457, tachy at 112 (ST), 23 RR, BP: 135/75, Temp 98.3 oral.  Also c/o severe indigestion.  12 lead ECG captured.

## 2014-11-13 NOTE — Progress Notes (Signed)
ANTIBIOTIC CONSULT NOTE - INITIAL  Pharmacy Consult for Vancomycin and Ceftazidime Indication: rule out sepsis  Allergies  Allergen Reactions  . Amoxicillin Itching  . Hydrocodone Hives and Nausea And Vomiting  . Latex Hives  . Penicillins Itching  . Prednisone Itching and Swelling    Patient Measurements: Height: 5\' 3"  (160 cm) Weight: 128 lb (58.06 kg) IBW/kg (Calculated) : 52.4  Vital Signs: Temp: 98.3 F (36.8 C) (09/18 0934) Temp Source: Oral (09/18 0934) BP: 132/68 mmHg (09/18 1045) Pulse Rate: 122 (09/18 1045) Intake/Output from previous day:   Intake/Output from this shift: Total I/O In: 1000 [I.V.:1000] Out: 480 [Urine:480]  Labs:  Recent Labs  11/13/14 1000 11/13/14 1032  WBC 21.4*  --   HGB 14.1 16.3*  PLT 348  --   CREATININE 1.41* 0.80   Estimated Creatinine Clearance: 83.5 mL/min (by C-G formula based on Cr of 0.8). No results for input(s): VANCOTROUGH, VANCOPEAK, VANCORANDOM, GENTTROUGH, GENTPEAK, GENTRANDOM, TOBRATROUGH, TOBRAPEAK, TOBRARND, AMIKACINPEAK, AMIKACINTROU, AMIKACIN in the last 72 hours.   Microbiology: No results found for this or any previous visit (from the past 720 hour(s)).  Medical History: Past Medical History  Diagnosis Date  . Insulin dependent diabetes mellitus with complications     insulin dependant  . Asthma   . Anxiety   . Diabetic neuropathy   . Depression     patient reports bipolor  . Bipolar 1 disorder   . Depression   . Syncopal episodes     Medications:  Anti-infectives    None     Assessment: 34 year old female who had a recent ano-vaginal fistula repair now presenting with sepsis.  To begin antibiotic therapy with Vancomycin and Ceftazidime.  Her SCr on point of care is 0.8 but on her CMET is 1.41 - CMET should be the more accurate lab so will adjust antibiotics for possible acute renal failure.  Goal of Therapy:  Vancomycin trough level 15-20 mcg/ml  Plan:  Ceftazidime 2g IV q8h Vancomycin  1g x 1 loading dose then 750mg  IV q12h Watch renal function closely Follow available micro data  Estella Husk, Pharm.D., BCPS, AAHIVP Clinical Pharmacist Phone: [phone] or [phone] 11/13/2014, 12:12 PM

## 2014-11-14 ENCOUNTER — Encounter (HOSPITAL_COMMUNITY): Payer: Self-pay | Admitting: General Practice

## 2014-11-14 DIAGNOSIS — F419 Anxiety disorder, unspecified: Secondary | ICD-10-CM

## 2014-11-14 DIAGNOSIS — F17210 Nicotine dependence, cigarettes, uncomplicated: Secondary | ICD-10-CM

## 2014-11-14 DIAGNOSIS — B9689 Other specified bacterial agents as the cause of diseases classified elsewhere: Secondary | ICD-10-CM

## 2014-11-14 DIAGNOSIS — J45909 Unspecified asthma, uncomplicated: Secondary | ICD-10-CM

## 2014-11-14 DIAGNOSIS — Z960 Presence of urogenital implants: Secondary | ICD-10-CM

## 2014-11-14 DIAGNOSIS — T148 Other injury of unspecified body region: Secondary | ICD-10-CM

## 2014-11-14 DIAGNOSIS — E1010 Type 1 diabetes mellitus with ketoacidosis without coma: Principal | ICD-10-CM

## 2014-11-14 DIAGNOSIS — Z794 Long term (current) use of insulin: Secondary | ICD-10-CM

## 2014-11-14 DIAGNOSIS — Z7951 Long term (current) use of inhaled steroids: Secondary | ICD-10-CM

## 2014-11-14 DIAGNOSIS — R339 Retention of urine, unspecified: Secondary | ICD-10-CM

## 2014-11-14 DIAGNOSIS — T8141XA Infection following a procedure, superficial incisional surgical site, initial encounter: Secondary | ICD-10-CM

## 2014-11-14 DIAGNOSIS — T814XXA Infection following a procedure, initial encounter: Secondary | ICD-10-CM

## 2014-11-14 DIAGNOSIS — F319 Bipolar disorder, unspecified: Secondary | ICD-10-CM

## 2014-11-14 DIAGNOSIS — N179 Acute kidney failure, unspecified: Secondary | ICD-10-CM

## 2014-11-14 DIAGNOSIS — Y839 Surgical procedure, unspecified as the cause of abnormal reaction of the patient, or of later complication, without mention of misadventure at the time of the procedure: Secondary | ICD-10-CM

## 2014-11-14 HISTORY — DX: Infection following a procedure, superficial incisional surgical site, initial encounter: T81.41XA

## 2014-11-14 HISTORY — DX: Retention of urine, unspecified: R33.9

## 2014-11-14 LAB — BASIC METABOLIC PANEL
Anion gap: 8 (ref 5–15)
BUN: 6 mg/dL (ref 6–20)
CALCIUM: 8.5 mg/dL — AB (ref 8.9–10.3)
CHLORIDE: 112 mmol/L — AB (ref 101–111)
CO2: 14 mmol/L — AB (ref 22–32)
CREATININE: 0.66 mg/dL (ref 0.44–1.00)
GFR calc Af Amer: >60 mL/min (ref >60)
GFR calc non Af Amer: >60 mL/min (ref >60)
GLUCOSE: 146 mg/dL — AB (ref 65–99)
Potassium: 3.3 mmol/L — ABNORMAL LOW (ref 3.5–5.1)
Sodium: 134 mmol/L — ABNORMAL LOW (ref 135–145)

## 2014-11-14 LAB — GLUCOSE, CAPILLARY
GLUCOSE-CAPILLARY: 111 mg/dL — AB (ref 65–99)
GLUCOSE-CAPILLARY: 194 mg/dL — AB (ref 65–99)
GLUCOSE-CAPILLARY: 199 mg/dL — AB (ref 65–99)
Glucose-Capillary: 125 mg/dL — ABNORMAL HIGH (ref 65–99)
Glucose-Capillary: 144 mg/dL — ABNORMAL HIGH (ref 65–99)
Glucose-Capillary: 145 mg/dL — ABNORMAL HIGH (ref 65–99)
Glucose-Capillary: 184 mg/dL — ABNORMAL HIGH (ref 65–99)
Glucose-Capillary: 181 mg/dL — ABNORMAL HIGH (ref 65–99)

## 2014-11-14 LAB — CBC
HCT: 34.3 % — ABNORMAL LOW (ref 36.0–46.0)
Hemoglobin: 11.2 g/dL — ABNORMAL LOW (ref 12.0–15.0)
MCH: 29 pg (ref 26.0–34.0)
MCHC: 32.7 g/dL (ref 30.0–36.0)
MCV: 88.9 fL (ref 78.0–100.0)
PLATELETS: 239 10*3/uL (ref 150–400)
RBC: 3.86 MIL/uL — AB (ref 3.87–5.11)
RDW: 13.7 % (ref 11.5–15.5)
WBC: 11.4 10*3/uL — AB (ref 4.0–10.5)

## 2014-11-14 LAB — HEMOGLOBIN A1C
Hgb A1c MFr Bld: 7.9 % — ABNORMAL HIGH (ref 4.8–5.6)
Mean Plasma Glucose: 180 mg/dL

## 2014-11-14 LAB — RAPID URINE DRUG SCREEN, HOSP PERFORMED
AMPHETAMINES: NOT DETECTED
Barbiturates: NOT DETECTED
Benzodiazepines: POSITIVE — AB
COCAINE: NOT DETECTED
OPIATES: POSITIVE — AB
TETRAHYDROCANNABINOL: NOT DETECTED

## 2014-11-14 LAB — HIV ANTIBODY (ROUTINE TESTING W REFLEX): HIV SCREEN 4TH GENERATION: NONREACTIVE

## 2014-11-14 MED ORDER — PANTOPRAZOLE SODIUM 40 MG PO TBEC
40.0000 mg | DELAYED_RELEASE_TABLET | Freq: Every day | ORAL | Status: DC
  Administered 2014-11-14 – 2014-11-15 (×2): 40 mg via ORAL
  Filled 2014-11-14 (×2): qty 1

## 2014-11-14 MED ORDER — METRONIDAZOLE 500 MG PO TABS
250.0000 mg | ORAL_TABLET | Freq: Three times a day (TID) | ORAL | Status: DC
  Administered 2014-11-14 – 2014-11-15 (×5): 250 mg via ORAL
  Filled 2014-11-14 (×5): qty 1

## 2014-11-14 MED ORDER — HYDROCODONE-ACETAMINOPHEN 5-325 MG PO TABS
1.0000 | ORAL_TABLET | Freq: Four times a day (QID) | ORAL | Status: DC | PRN
  Administered 2014-11-14 – 2014-11-15 (×4): 1 via ORAL
  Filled 2014-11-14 (×4): qty 1

## 2014-11-14 MED ORDER — OXYCODONE HCL 5 MG PO TABS
5.0000 mg | ORAL_TABLET | Freq: Once | ORAL | Status: AC
  Administered 2014-11-14: 5 mg via ORAL
  Filled 2014-11-14: qty 1

## 2014-11-14 MED ORDER — SODIUM CHLORIDE 0.9 % IV SOLN
INTRAVENOUS | Status: DC
  Administered 2014-11-14: 09:00:00 via INTRAVENOUS

## 2014-11-14 MED ORDER — POTASSIUM CHLORIDE CRYS ER 20 MEQ PO TBCR
40.0000 meq | EXTENDED_RELEASE_TABLET | Freq: Once | ORAL | Status: AC
  Administered 2014-11-14: 40 meq via ORAL
  Filled 2014-11-14: qty 2

## 2014-11-14 MED ORDER — METRONIDAZOLE 500 MG PO TABS: 250.0000 mg | ORAL_TABLET | Freq: Two times a day (BID) | ORAL | Status: DC

## 2014-11-14 NOTE — Progress Notes (Signed)
Subjective: [name] continues to be very groggy this morning. Unable to void with foley out.   Objective: Vital signs in last 24 hours: Filed Vitals:   11/14/14 0700 11/14/14 0930 11/14/14 0959 11/14/14 1534  BP: 97/51 102/64 113/67 115/76  Pulse: 92 95 102 98  Temp: 98.4 F (36.9 C)  97.7 F (36.5 C) 98.2 F (36.8 C)  TempSrc: Oral  Oral   Resp:  19 18 16   Height:      Weight:      SpO2: 100% 100% 100% 100%   Weight change:   Intake/Output Summary (Last 24 hours) at 11/14/14 1621 Last data filed at 11/14/14 1340  Gross per 24 hour  Intake 1660.59 ml  Output   1970 ml  Net -309.41 ml    Physical Exam GENERAL- alert, co-operative, not in any distress. HEENT- Atraumatic, normocephalic, oral mucosa appears dry CARDIAC- RRR, no murmurs, rubs or gallops. RESP- Moving equal volumes of air, and clear to auscultation bilaterally, no wheezes or crackles. ABDOMEN- Soft, nontender, no guarding or rebound, no palpable masses or organomegaly, bowel sounds present. NEURO- AAO x 3 GU: Minimal erythema around the perineum. Small 3 mm open incision. Healing well.  EXTREMITIES- pulse 2+, symmetric, no pedal edema. SKIN- Warm, dry, No rash or lesion.  Medications: I have reviewed the patient's current medications. Scheduled Meds: . ALPRAZolam  1 mg Oral QID  . budesonide  0.5 mg Nebulization BID  . buPROPion  450 mg Oral Daily  . heparin  5,000 Units Subcutaneous 3 times per day  . insulin aspart  0-9 Units Subcutaneous TID WC  . insulin glargine  28 Units Subcutaneous QHS  . lithium carbonate  450 mg Oral QHS  . metroNIDAZOLE  250 mg Oral 3 times per day  . montelukast  10 mg Oral QHS  . senna-docusate  1 tablet Oral BID  . sodium chloride  3 mL Intravenous Q12H   Continuous Infusions: . sodium chloride 100 mL/hr at 11/14/14 0845   PRN Meds:.dextrose, HYDROcodone-acetaminophen, ipratropium-albuterol, promethazine Assessment/Plan: Principal Problem:   DKA (diabetic  ketoacidoses) Active Problems:   Asthma   Anxiety   Bipolar depression   Tobacco abuse   AKI (acute kidney injury)  DKA: AG closed overnight with IVF resuscitation and insulin drip. Transitioned off insulin drip and back to her home insulin regimen. Still appears mildly dehydrated today. Encouraged PO intake. Will transfer out of step down today to floor. Likely discharge tomorrow.  - Lantus 28 units qhs - SSI-sensitive - KCl 40 mEq  - Phenergan 25 mg IV q4hr prn - Bmet in am  AKI: resolved following IVF. Cr today 0.66.   Urinary retention: Unable to void today following d/c of foley. Had 580 mL on bladder scan after 6 hours off foley. Will replace foley today. Repeat trial tomorrow.   Surgical site infection: d/c vanc and ceftaz. WBC improved to 11 today. SIRS likely reactionary to DKA. Will restart her course of Flagyl.   DVT PPx: Heparin  Dispo: Disposition is deferred at this time, awaiting improvement of current medical problems.  Anticipated discharge in approximately 1-2 day(s).   The patient does not have a current PCP (No Pcp Per Patient) and does need an [facility] hospital follow-up appointment after discharge.  The patient does not have transportation limitations that hinder transportation to clinic appointments.  .Services Needed at time of discharge: Y = Yes, Blank = No PT:   OT:   RN:   Equipment:   Other:  LOS: 1 day   Valentino Nose, MD IMTS PGY-1 [phone] 11/14/2014, 4:21 PM

## 2014-11-14 NOTE — Progress Notes (Signed)
Patient transferring to 6N. Report called to receiving nurse. Patient request to eat breakfast before transferring.

## 2014-11-14 NOTE — H&P (Signed)
Internal Medicine Attending Admission Note  I saw and evaluated the patient. I reviewed the resident's note and I agree with the resident's findings and plan as documented in the resident's note.  Assessment & Plan by Problem:  Principal Problem:   DKA (diabetic ketoacidoses) Active Problems:   Asthma   Anxiety   Bipolar depression   Tobacco abuse   SIRS (systemic inflammatory response syndrome)   AKI (acute kidney injury)   DKA: Patient presented with anion gap metabolic acidosis due to diabetic ketoacidosis. Likely the stress of the recent surgery caused progressive ketosis which was compounded by reduced insulin intake. She was treated overnight with insulin infusion and rehydration, anion gap acidosis is now resolved. Transitioned back to home insulin subcutaneous regimen this morning. Feeling much better today and going to start eating and drinking. Potassium has been stable overnight with frequent monitoring and supplementation. Still appears mildly dehydrated so will give a little more IV fluids and encourage oral intake today. Likely ready for discharge tomorrow. Move from stepdown to floor today.  Surgical Site Infection: Based on exam the surgical site infection is very mild. She was started on vancomycin and ceftaz edema for broad coverage on admission given systemic inflammation. I think likely her Sirs were stemming from DKA and dehydration at this point. No other obvious source of infection. Plan is to discontinue broad antibiotics and transitioned back to oral Flagyl to complete course for localized surgical site infection.  Urinary retention: Postoperative complication likely due to anesthesia. Foley catheter remains a potential source for infection. Plan to discontinue Foley today and do a trial of voiding. If she is able to empty her bladder completely, we will leave the Foley catheter out.   Chief Complaint(s): nausea and vomiting  History - key components related to  admission:  34 year old woman with type 1 diabetes brought to the emergency department yesterday for increasing somnolence and several days of progressive nausea and vomiting. Patient reports that she was in her usual state of health, had a elective surgery on September 12 to repair damage perineum. Did fairly well after the surgery until about 3 days ago when she began to have increasing nausea and vomiting. She followed up with her surgeon who reported that the site looked a little erythematous and started her on metronidazole for a local surgical site infection. Because she wasn't eating very much her home insulin dose was decreased from 28 units daily to 15 units daily. Over the last 2 days she had increasing abdominal pain and discomfort. Denied any diarrhea, chest pain, fevers, cough, or shortness of breath. She has had a Foley in place since the surgery because she had urinary retention after anesthesia. Plan was to do a voiding trial at the end of last week but she didn't complete this because of her acute illness. Currently she denies any pain from the surgical site.  Lab results: Reviewed in Epic  Physical Exam - key components related to admission:  Filed Vitals:   11/14/14 0600 11/14/14 0700 11/14/14 0930 11/14/14 0959  BP: 109/59 97/51 102/64 113/67  Pulse: 91 92 95 102  Temp:  98.4 F (36.9 C)  97.7 F (36.5 C)  TempSrc:  Oral  Oral  Resp: 15  19 18   Height:      Weight:      SpO2: 100% 100% 100% 100%   Gen: Well appearing young woman, lying in bed, no distress ENT: Dry MM, poor dentition, no lesions Neck: No thyromegaly or adenopathy CV:  RRR, no murmurs Lungs: unlabored, clear throughout Abd: Soft, non-tender, non-distended GU: Perineum is clean with minimal errythema, small 3 mm open incision without sutures, small sero-sanguinous drainage on her dressing.  Ext: well perfused, no edema, normal joints Skin: Warm, no rashes

## 2014-11-14 NOTE — Progress Notes (Signed)
Received patient from 2 C via wheelchair.

## 2014-11-14 NOTE — Progress Notes (Signed)
Inpatient Diabetes Program Recommendations  AACE/ADA: New Consensus Statement on Inpatient Glycemic Control (2015)  Target Ranges:  Prepandial:   less than 140 mg/dL      Peak postprandial:   less than 180 mg/dL (1-2 hours)      Critically ill patients:  140 - 180 mg/dL   Review of Glycemic Control  Diabetes history: DM 1 Outpatient Diabetes medications: Toujeo 28 units, Novolog 2-10 units meal coverage Current orders for Inpatient glycemic control: Lantus 28 units (equivalent Toujeo dose), Novolog Sensitive scale + 0-10 units Novolog meal coverage  Inpatient Diabetes Program Recommendations: Recommend patient be discharged on home dose of 28 units of Toujeo instead of decreased dose she was placed on from the other facility. Glucose WNL now. The other facility did not realize DM type 1 will constantly need same doses of basal insulin. If poor PO intake then she will not cover those carbs. If feeling sick, check glucose often.  Thanks,  Christena Deem RN, MSN, [facility] Inpatient Diabetes Coordinator Team Pager [phone] (8a-5p)

## 2014-11-15 DIAGNOSIS — E876 Hypokalemia: Secondary | ICD-10-CM

## 2014-11-15 DIAGNOSIS — J189 Pneumonia, unspecified organism: Secondary | ICD-10-CM

## 2014-11-15 HISTORY — DX: Hypokalemia: E87.6

## 2014-11-15 LAB — BASIC METABOLIC PANEL
ANION GAP: 5 (ref 5–15)
Anion gap: 5 (ref 5–15)
BUN: <5 mg/dL — ABNORMAL LOW (ref 6–20)
CHLORIDE: 109 mmol/L (ref 101–111)
CHLORIDE: 107 mmol/L (ref 101–111)
CO2: 25 mmol/L (ref 22–32)
CO2: 25 mmol/L (ref 22–32)
Calcium: 8.6 mg/dL — ABNORMAL LOW (ref 8.9–10.3)
Calcium: 8.6 mg/dL — ABNORMAL LOW (ref 8.9–10.3)
Creatinine, Ser: 0.5 mg/dL (ref 0.44–1.00)
Creatinine, Ser: 0.59 mg/dL (ref 0.44–1.00)
GFR calc Af Amer: >60 mL/min (ref >60)
GFR calc Af Amer: >60 mL/min (ref >60)
GFR calc non Af Amer: >60 mL/min (ref >60)
GLUCOSE: 88 mg/dL (ref 65–99)
GLUCOSE: 168 mg/dL — AB (ref 65–99)
POTASSIUM: 3.1 mmol/L — AB (ref 3.5–5.1)
Potassium: 2.7 mmol/L — CL (ref 3.5–5.1)
Sodium: 139 mmol/L (ref 135–145)
Sodium: 137 mmol/L (ref 135–145)

## 2014-11-15 LAB — GLUCOSE, CAPILLARY
GLUCOSE-CAPILLARY: 150 mg/dL — AB (ref 65–99)
Glucose-Capillary: 139 mg/dL — ABNORMAL HIGH (ref 65–99)

## 2014-11-15 LAB — CBC
HCT: 31.5 % — ABNORMAL LOW (ref 36.0–46.0)
HEMOGLOBIN: 10.6 g/dL — AB (ref 12.0–15.0)
MCH: 29 pg (ref 26.0–34.0)
MCHC: 33.7 g/dL (ref 30.0–36.0)
MCV: 86.1 fL (ref 78.0–100.0)
Platelets: 192 10*3/uL (ref 150–400)
RBC: 3.66 MIL/uL — ABNORMAL LOW (ref 3.87–5.11)
RDW: 13.6 % (ref 11.5–15.5)
WBC: 7.4 10*3/uL (ref 4.0–10.5)

## 2014-11-15 MED ORDER — POTASSIUM CHLORIDE CRYS ER 20 MEQ PO TBCR
40.0000 meq | EXTENDED_RELEASE_TABLET | Freq: Once | ORAL | Status: AC
  Administered 2014-11-15: 40 meq via ORAL
  Filled 2014-11-15: qty 2

## 2014-11-15 MED ORDER — POTASSIUM CHLORIDE 10 MEQ/100ML IV SOLN
10.0000 meq | INTRAVENOUS | Status: DC
  Administered 2014-11-15: 10 meq via INTRAVENOUS
  Filled 2014-11-15: qty 100

## 2014-11-15 MED ORDER — POTASSIUM CHLORIDE ER 20 MEQ PO TBCR: 40.0000 meq | EXTENDED_RELEASE_TABLET | Freq: Every day | ORAL | Status: DC

## 2014-11-15 NOTE — Progress Notes (Signed)
Internal Medicine Attending:   I saw and examined the patient. I reviewed the resident's note and I agree with the resident's findings and plan as documented in the resident's note.  34 year old woman with type 1 diabetes admitted with diabetic ketoacidosis which is now resolved. She had a residual  hyperchloremic non-anion gap metabolic acidosis yesterday which is now resolved. Eating and drinking fairly well with appropriate blood sugars for the last 48 hours. Plan for discharge to home today with current regimen of long-acting insulin and carb count coverage. Voiding trial today, remove Foley catheter. Patient is to use either the bathroom or bedside commode to try to urinate. Then please complete a post void residual. It doesn't she seem she'll be able to do in and out catheterizations, so if she has a post void residual greater than 300 would reinsert the Foley catheter and have her follow-up with outpatient urology.

## 2014-11-15 NOTE — Progress Notes (Signed)
Md made aware patient unable to urinate, bladder scan showing about 600. Will re insert foley as ordered.

## 2014-11-15 NOTE — Progress Notes (Signed)
CRITICAL VALUE ALERT  Critical value received: K+ level 2.7  Date of notification: 11/15/14  Time of notification:0615  Critical value read back:yes  Nurse who received alert:A. Brickhouse  MD notified (1st page): Dr Ala Dach Time of first TFTD:3220  MD notified (2nd page):  Time of second page:  Responding MD:Dr Tyson Alias  Time MD responded: [phone]

## 2014-11-15 NOTE — Progress Notes (Signed)
Subjective: Melody Myers reports she feels about the same this morning. Denies any nausea, vomiting or dizziness. Still reports being drowsy. Complaining of pain at her IV site. She was unable to void once the foley was removed yesterday.   Objective: Vital signs in last 24 hours: Filed Vitals:   11/14/14 1534 11/14/14 2130 11/14/14 2147 11/15/14 0517  BP: 115/76  115/75 106/64  Pulse: 98  84 77  Temp: 98.2 F (36.8 C)  98.2 F (36.8 C) 98.2 F (36.8 C)  TempSrc:   Oral   Resp: 16  17 16   Height:      Weight:    129 lb 8.3 oz (58.75 kg)  SpO2: 100% 100% 100% 100%   Weight change: 1 lb 8.3 oz (0.689 kg)  Intake/Output Summary (Last 24 hours) at 11/15/14 0809 Last data filed at 11/15/14 0517  Gross per 24 hour  Intake    663 ml  Output   2470 ml  Net  -1807 ml    Physical Exam GENERAL- alert, co-operative, not in any distress. HEENT- Atraumatic, normocephalic, oral mucosa appears moist CARDIAC- RRR, no murmurs, rubs or gallops. RESP- Moving equal volumes of air, and clear to auscultation bilaterally, no wheezes or crackles. ABDOMEN- Soft, nontender, no guarding or rebound, no palpable masses or organomegaly, bowel sounds present. NEURO- AAO x 3 EXTREMITIES- pulse 2+, symmetric, no pedal edema. SKIN- Warm, dry, No rash or lesion.  Medications: I have reviewed the patient's current medications. Scheduled Meds: . ALPRAZolam  1 mg Oral QID  . budesonide  0.5 mg Nebulization BID  . buPROPion  450 mg Oral Daily  . heparin  5,000 Units Subcutaneous 3 times per day  . insulin aspart  0-9 Units Subcutaneous TID WC  . insulin glargine  28 Units Subcutaneous QHS  . lithium carbonate  450 mg Oral QHS  . metroNIDAZOLE  250 mg Oral 3 times per day  . montelukast  10 mg Oral QHS  . pantoprazole  40 mg Oral Daily  . potassium chloride SA  40 mEq Oral Once  . senna-docusate  1 tablet Oral BID   Continuous Infusions:   PRN Meds:.HYDROcodone-acetaminophen, ipratropium-albuterol,  promethazine Assessment/Plan: Principal Problem:   DKA (diabetic ketoacidoses) Active Problems:   Urinary retention   Superficial incisional surgical site infection  DKA: Blood sugars have been well controlled. 139-194. She was able to eat breakfast and lunch. No nausea. Will continue her home insulin regimen. K was low at 2.7. Improved to 3.1 with replacement this morning. Will give another 40 KCl today and send home with 3 days of replacement.  - Lantus 28 units qhs - SSI-sensitive - KCl 40 mEq  - Phenergan 25 mg IV q4hr prn - Ready for discharge today  Urinary retention: Unable to void again today following d/c of foley. Had >616mL on bladder scan after 5 hours off foley. Patient not interested in doing in and out caths, so will replace foley. She has follow up with her surgeon tomorrow.  Surgical site infection:WBC resolved. Will continue her course of Flagyl.   DVT PPx: Heparin  Dispo: Ready for discharge today  The patient does not have a current PCP (No Pcp Per Patient) and does need an [facility] hospital follow-up appointment after discharge.  The patient does not have transportation limitations that hinder transportation to clinic appointments.  .Services Needed at time of discharge: Y = Yes, Blank = No PT:   OT:   RN:   Equipment:   Other:  LOS: 2 days   Melody Nose, MD IMTS PGY-1 [phone] 11/15/2014, 8:09 AM

## 2014-11-15 NOTE — Discharge Instructions (Signed)
Melody Myers, you were admitted for Diabetic Ketoacidosis. Your blood sugars have come down and are doing great. Please continue your home regimen of insulin. Your potassium is still a little low so we will send you home with some potassium pills.   Please follow up with your endocrinologist in the next week. If you are unable to be seen by them please follow up with [facility].  Follow up with your Surgery at your scheduled appointment tomorrow. You are still unable to void without the foley catheter and will need to follow up with him.

## 2014-11-15 NOTE — Discharge Summary (Signed)
Name: Melody Myers MRN: [id number] DOB: Dec 15, 1980 34 y.o. PCP: No Pcp Per Patient  Date of Admission: 11/13/2014  9:22 AM Date of Discharge: 11/15/2014 Attending Physician: Dr. Erlinda Hong  Discharge Diagnosis: Principal Problem:   DKA (diabetic ketoacidoses) Active Problems:   Urinary retention   Superficial incisional surgical site infection   Hypokalemia  Discharge Medications:   Medication List    TAKE these medications        acetaminophen 325 MG tablet  Commonly known as:  TYLENOL  Take 650 mg by mouth every 6 (six) hours as needed.     ALPRAZolam 1 MG tablet  Commonly known as:  XANAX  Take 1 mg by mouth 4 (four) times daily.     BELSOMRA 15 MG Tabs  Generic drug:  Suvorexant  Take 1 tablet by mouth at bedtime as needed. for sleep     buPROPion 150 MG 24 hr tablet  Commonly known as:  WELLBUTRIN XL  Take 450 mg by mouth daily.     esomeprazole 20 MG capsule  Commonly known as:  NEXIUM  Take 20 mg by mouth 2 (two) times daily before a meal.     gabapentin 300 MG capsule  Commonly known as:  NEURONTIN  Take 300 mg by mouth 3 (three) times daily.     ibuprofen 200 MG tablet  Commonly known as:  ADVIL,MOTRIN  Take 800 mg by mouth every 6 (six) hours as needed.     insulin aspart 100 UNIT/ML injection  Commonly known as:  novoLOG  Inject 2-10 Units into the skin 3 (three) times daily before meals.     ipratropium-albuterol 0.5-2.5 (3) MG/3ML Soln  Commonly known as:  DUONEB  Take 3 mLs by nebulization 2 (two) times daily.     lithium carbonate 450 MG CR tablet  Commonly known as:  ESKALITH  Take 450 mg by mouth at bedtime.     metroNIDAZOLE 250 MG tablet  Commonly known as:  FLAGYL  Take 250 mg by mouth 3 (three) times daily. For 7 days. Started on 11-11-14     mometasone 220 MCG/INH inhaler  Commonly known as:  ASMANEX  Inhale 2 puffs into the lungs 2 (two) times daily.     montelukast 10 MG tablet  Commonly known as:  SINGULAIR  Take 10  mg by mouth at bedtime.     multivitamin with minerals Tabs tablet  Take 1 tablet by mouth daily.     naproxen 500 MG tablet  Commonly known as:  NAPROSYN  Take 1 tablet (500 mg total) by mouth 2 (two) times daily with a meal.     NORCO 5-325 MG per tablet  Generic drug:  HYDROcodone-acetaminophen  Take 1 tablet by mouth every 6 (six) hours as needed for moderate pain.     ondansetron 4 MG disintegrating tablet  Commonly known as:  ZOFRAN ODT  Take 1 tablet (4 mg total) by mouth every 8 (eight) hours as needed for nausea.     ondansetron 4 MG tablet  Commonly known as:  ZOFRAN  Take 4 mg by mouth every 8 (eight) hours as needed for nausea or vomiting.     Potassium Chloride ER 20 MEQ Tbcr  Take 40 mEq by mouth daily.     TOUJEO SOLOSTAR 300 UNIT/ML Sopn  Generic drug:  Insulin Glargine  Inject 28 Units into the skin daily.        Disposition and follow-up:   Ms.Melody Myers was discharged from  [facility] in Stable condition.  At the hospital follow up visit please address:  1. [name] was admitted for DKA. Please follow up her blood sugars. 2. Her potassium was still mildly low at 3.1 on discharge. Sent home with 3 day course of KCl. Please follow up on her K level. 3.  She is still experiencing urinary retention and requiring foley cath. Please address and consider urology referral if needed.  4.  Labs / imaging needed at time of follow-up: CBG, BMET 5.  Pending labs/ test needing follow-up: None  Follow-up Appointments:     Follow-up Information    Follow up with Durenda Hurt, MD On 11/16/2014.   Specialty:  Surgical Oncology   Contact information:   [address] [facility] [place] [address] [phone]       Follow up with [facility]    . Schedule an appointment as soon as possible for a visit in 1 week.   Contact information:   [address] [place]  [address] [phone]      Discharge Instructions: Discharge Instructions    Call MD for:  persistant nausea and vomiting    Complete by:  As directed      Call MD for:  redness, tenderness, or signs of infection (pain, swelling, redness, odor or green/yellow discharge around incision site)    Complete by:  As directed      Call MD for:  severe uncontrolled pain    Complete by:  As directed      Call MD for:  temperature >100.4    Complete by:  As directed      Diet - low sodium heart healthy    Complete by:  As directed      Increase activity slowly    Complete by:  As directed            Consultations:  None  Procedures Performed:  Ct Abdomen Pelvis W Contrast  11/13/2014   CLINICAL DATA:  Recent anal vaginal reconstructive surgery for fistula repair on Monday, postop pain in nausea  EXAM: CT ABDOMEN AND PELVIS WITH CONTRAST  TECHNIQUE: Multidetector CT imaging of the abdomen and pelvis was performed using the standard protocol following bolus administration of intravenous contrast.  CONTRAST:  OMNIPAQUE IOHEXOL 300 MG/ML  SOLN  COMPARISON:  None.  FINDINGS: Lung bases are unremarkable. Mild fatty infiltration of the liver. No focal hepatic mass. Sagittal images of the spine are unremarkable. The pancreas, spleen and adrenal glands are unremarkable. Kidneys are symmetrical in size and enhancement. No aortic aneurysm. No hydronephrosis or hydroureter.  No small bowel obstruction. Normal appendix. No pericecal inflammation. The terminal ileum is unremarkable.  No distal colonic obstruction. No colitis or diverticulitis. There is a Foley catheter within urinary bladder. The uterus is anteflexed. A left ovarian cyst is noted measures 3 cm. Tiny amount of air is noted within lower vagina and probable postsurgical in nature. Tiny amount of air is noted anterior anal wall probable postsurgical in nature. There is no evidence of pelvic hematoma. No subcutaneous abscess or hematoma.   IMPRESSION: 1. There is no evidence of acute inflammatory process within abdomen. 2. Mild fatty infiltration of the liver. 3. No hydronephrosis or hydroureter. 4. Normal appendix.  No pericecal inflammation. 5. There is a left ovarian cyst measures 3 cm. 6. Tiny amount of vaginal air and tiny amount of air anterior anal wall probable postsurgical in  nature. No evidence of perianal abscess or hematoma. 7. Unremarkable uterus. 8. There is a Foley catheter within urinary bladder. No bladder hematoma.   Electronically Signed   By: Natasha Mead M.D.   On: 11/13/2014 11:59   Dg Chest Port 1 View  11/13/2014   CLINICAL DATA:  Severe nausea vomiting, and pelvic pain, the patient is status post anal vaginal fistula repair.  EXAM: PORTABLE CHEST - 1 VIEW  COMPARISON:  07/28/2014  FINDINGS: Cardiomediastinal silhouette is normal. Mediastinal contours appear intact.  There is no evidence of focal airspace consolidation, pleural effusion or pneumothorax.  Osseous structures are without acute abnormality. Soft tissues are grossly normal.  IMPRESSION: No radiographic evidence of acute cardiopulmonary abnormality.   Electronically Signed   By: Ted Mcalpine M.D.   On: 11/13/2014 10:42   Admission HPI: Ms. Casad is a 34 year old female with a PMH of DM type 1 with neuropathy, Asthma, Anxiety, Depression, Bipolar disorder presents to [facility] ED with nausea, vomiting, increased somnolence. Found to have a BG level of 435. Patient was somnolent and groggy, most of the history was obtained from her husband at the bedside. 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 She went back the following day because of pain and nausea. Her pain medicine was increased from Norco 5 mg to 10 mg and was also started on Flagyl at that time. She reports that her last A1c was in the 7s and is well controlled on Toujeo 28 units daily and carb counting sliding scale (meter  calculates the dose for her) though the husband reports her sugars are usually in the 200s. After the surgery she was told to cut her Toujeo dose in half as she was not eating as much and has been only taking 15 units daily. She has not been eating much the past 2-3 days with increasing somnolence and N/V. Reports polyuria and polydipsia. Has been having chills but no fevers. Denies any abdominal pain, diarrhea, SOB or chest pain. She has had a foley cath in place since the surgery. Denies any dysuria.   Abdominal CT with no acute inflammatory process. Tiny amount of vaginal air and tiny amount of air anterior anal wall probably postsurgical. No evidence of perianal abscess or hematoma.  CXR with no acute abnormality.   Hospital Course by problem list: Principal Problem:   DKA (diabetic ketoacidoses) Active Problems:   Urinary retention   Superficial incisional surgical site infection   Hypokalemia   Diabetic Ketoacidoses: Ms. Blankinship presented to the [facility] ED with increasing somnolence and several day history of nausea and vomiting. BG was found to be in the 400s with an AG of 23 and ketonuria. She was treated with IVF and insulin infusion. Her anion gap ketoacidosis resolved and she was transitioned back to her home insulin regimen. She was able to tolerate PO intake. No further symptoms of somnolence, nausea or vomiting. Blood sugars were stable in the 100s. Her potassium was repleted but remained low at 3.1 at discharge. Will send her home with 3 day course of KCl 40 mEq daily and on her home insulin regimen for follow up with PCP in the next week.  AKI: Cr elevated to 1.41 on admission. Improved following IVF. Likely due to hypovolemia from decreased PO intake. Cr was 0.59 at time of discharge.   Urinary Retention: She has had a foley in place since the time of her surgery on 9/12.  She was unable to void during voiding trial here after 6 hours with >600 mL present on bladder scan. She was not  interested in in and out catheterizations and Foley catheter was place. She has follow up with her surgeon, Dr. Byrd Hesselbach, tomorrow.   Superficial incisional surgical site infection: She was tachycardic, tachypenic with a WBC of 21 on presentation. CXR and Abdominal CT scan showed no acute abnormalities. UA was only significant for glucose and ketones. Ceftazidime and Vancomycin were started in the ED. Her SIRS improved following IVF and was thought to be 2/2 DKA and dehydration. Inspection of the surgical site showed only mild erythema and scant serosanguinous drainage. Stopped broad spectrum ABX and restarted course of Flagyl.   Discharge Vitals:   BP 116/84 mmHg  Pulse 83  Temp(Src) 98.4 F (36.9 C) (Oral)  Resp 16  Ht  (1.6 m)  Wt 129 lb 8.3 oz (58.75 kg)  BMI 22.95 kg/m2  SpO2 100%  LMP  (LMP Unknown)  Discharge Labs:  Results for orders placed or performed during the hospital encounter of 11/13/14 (from the past 24 hour(s))  Glucose, capillary     Status: Abnormal   Collection Time: 11/14/14  9:47 PM  Result Value Ref Range   Glucose-Capillary 194 (H) 65 - 99 mg/dL   Comment 1 Notify RN    Comment 2 Document in Chart   Basic metabolic panel     Status: Abnormal   Collection Time: 11/15/14  5:10 AM  Result Value Ref Range   Sodium 139 135 - 145 mmol/L   Potassium 2.7 (LL) 3.5 - 5.1 mmol/L   Chloride 109 101 - 111 mmol/L   CO2 25 22 - 32 mmol/L   Glucose, Bld 88 65 - 99 mg/dL   BUN <5 (L) 6 - 20 mg/dL   Creatinine, Ser 1.61 0.44 - 1.00 mg/dL   Calcium 8.6 (L) 8.9 - 10.3 mg/dL   GFR calc non Af Amer >60 >60 mL/min   GFR calc Af Amer >60 >60 mL/min   Anion gap 5 5 - 15  CBC     Status: Abnormal   Collection Time: 11/15/14  5:10 AM  Result Value Ref Range   WBC 7.4 4.0 - 10.5 K/uL   RBC 3.66 (L) 3.87 - 5.11 MIL/uL   Hemoglobin 10.6 (L) 12.0 - 15.0 g/dL   HCT 09.6 (L) 04.5 - 40.9 %   MCV 86.1 78.0 - 100.0 fL   MCH 29.0 26.0 - 34.0 pg   MCHC 33.7 30.0 - 36.0 g/dL    RDW 81.1 91.4 - 78.2 %   Platelets 192 150 - 400 K/uL  Glucose, capillary     Status: Abnormal   Collection Time: 11/15/14  7:15 AM  Result Value Ref Range   Glucose-Capillary 150 (H) 65 - 99 mg/dL  Basic metabolic panel     Status: Abnormal   Collection Time: 11/15/14 11:18 AM  Result Value Ref Range   Sodium 137 135 - 145 mmol/L   Potassium 3.1 (L) 3.5 - 5.1 mmol/L   Chloride 107 101 - 111 mmol/L   CO2 25 22 - 32 mmol/L   Glucose, Bld 168 (H) 65 - 99 mg/dL   BUN <5 (L) 6 - 20 mg/dL   Creatinine, Ser 9.56 0.44 - 1.00 mg/dL   Calcium 8.6 (L) 8.9 - 10.3 mg/dL   GFR calc non Af Amer >60 >60 mL/min   GFR calc Af Amer >60 >60 mL/min   Anion gap  5 5 - 15  Glucose, capillary     Status: Abnormal   Collection Time: 11/15/14 11:22 AM  Result Value Ref Range   Glucose-Capillary 139 (H) 65 - 99 mg/dL    Signed: Valentino Nose, MD 11/15/2014, 7:30 PM

## 2014-11-15 NOTE — Progress Notes (Signed)
Discharge home. Home discharge instruction given, no question verbalized. Discharged with foley in, leg bag placed , instructed to follow up with her surgeon in am at [facility] per md instruction.

## 2014-11-18 LAB — CULTURE, BLOOD (ROUTINE X 2)
Culture: NO GROWTH
Culture: NO GROWTH

## 2015-04-12 DIAGNOSIS — E1042 Type 1 diabetes mellitus with diabetic polyneuropathy: Secondary | ICD-10-CM | POA: Insufficient documentation

## 2015-04-12 DIAGNOSIS — E10649 Type 1 diabetes mellitus with hypoglycemia without coma: Secondary | ICD-10-CM | POA: Insufficient documentation

## 2015-04-17 DIAGNOSIS — Z860101 Personal history of adenomatous and serrated colon polyps: Secondary | ICD-10-CM

## 2015-04-17 DIAGNOSIS — Z86010 Personal history of colonic polyps: Secondary | ICD-10-CM | POA: Insufficient documentation

## 2015-04-17 DIAGNOSIS — Z8659 Personal history of other mental and behavioral disorders: Secondary | ICD-10-CM

## 2015-04-17 HISTORY — DX: Personal history of other mental and behavioral disorders: Z86.59

## 2015-04-17 HISTORY — DX: Personal history of adenomatous and serrated colon polyps: Z86.0101

## 2015-04-17 HISTORY — DX: Personal history of colonic polyps: Z86.010

## 2015-05-12 DIAGNOSIS — G5603 Carpal tunnel syndrome, bilateral upper limbs: Secondary | ICD-10-CM | POA: Insufficient documentation

## 2015-05-31 DIAGNOSIS — E1029 Type 1 diabetes mellitus with other diabetic kidney complication: Secondary | ICD-10-CM | POA: Insufficient documentation

## 2015-06-05 DIAGNOSIS — M51369 Other intervertebral disc degeneration, lumbar region without mention of lumbar back pain or lower extremity pain: Secondary | ICD-10-CM

## 2015-06-05 DIAGNOSIS — M5136 Other intervertebral disc degeneration, lumbar region: Secondary | ICD-10-CM | POA: Insufficient documentation

## 2015-06-05 HISTORY — DX: Other intervertebral disc degeneration, lumbar region without mention of lumbar back pain or lower extremity pain: M51.369

## 2015-06-05 HISTORY — DX: Other intervertebral disc degeneration, lumbar region: M51.36

## 2015-07-11 DIAGNOSIS — E049 Nontoxic goiter, unspecified: Secondary | ICD-10-CM | POA: Insufficient documentation

## 2015-12-25 DIAGNOSIS — K5901 Slow transit constipation: Secondary | ICD-10-CM | POA: Insufficient documentation

## 2016-01-11 DIAGNOSIS — R600 Localized edema: Secondary | ICD-10-CM | POA: Insufficient documentation

## 2016-01-25 DIAGNOSIS — E1143 Type 2 diabetes mellitus with diabetic autonomic (poly)neuropathy: Secondary | ICD-10-CM | POA: Insufficient documentation

## 2016-05-16 DIAGNOSIS — M6702 Short Achilles tendon (acquired), left ankle: Secondary | ICD-10-CM | POA: Insufficient documentation

## 2016-09-24 DIAGNOSIS — H43393 Other vitreous opacities, bilateral: Secondary | ICD-10-CM

## 2016-09-24 DIAGNOSIS — H16223 Keratoconjunctivitis sicca, not specified as Sjogren's, bilateral: Secondary | ICD-10-CM

## 2016-09-24 DIAGNOSIS — H5213 Myopia, bilateral: Secondary | ICD-10-CM

## 2016-09-24 HISTORY — DX: Keratoconjunctivitis sicca, not specified as Sjogren's, bilateral: H16.223

## 2016-09-24 HISTORY — DX: Myopia, bilateral: H52.13

## 2016-09-24 HISTORY — DX: Other vitreous opacities, bilateral: H43.393

## 2016-12-11 DIAGNOSIS — N393 Stress incontinence (female) (male): Secondary | ICD-10-CM

## 2016-12-11 DIAGNOSIS — K625 Hemorrhage of anus and rectum: Secondary | ICD-10-CM | POA: Insufficient documentation

## 2016-12-11 HISTORY — DX: Stress incontinence (female) (male): N39.3

## 2017-06-08 DIAGNOSIS — R82998 Other abnormal findings in urine: Secondary | ICD-10-CM | POA: Insufficient documentation

## 2017-06-08 DIAGNOSIS — R32 Unspecified urinary incontinence: Secondary | ICD-10-CM | POA: Insufficient documentation

## 2017-06-08 HISTORY — DX: Unspecified urinary incontinence: R32

## 2017-06-08 HISTORY — DX: Other abnormal findings in urine: R82.998

## 2018-07-02 DIAGNOSIS — M79674 Pain in right toe(s): Secondary | ICD-10-CM | POA: Insufficient documentation

## 2018-08-26 DIAGNOSIS — R233 Spontaneous ecchymoses: Secondary | ICD-10-CM

## 2018-08-26 HISTORY — DX: Spontaneous ecchymoses: R23.3

## 2018-10-02 ENCOUNTER — Inpatient Hospital Stay (HOSPITAL_COMMUNITY)
Admission: AD | Admit: 2018-10-02 | Discharge: 2018-10-04 | DRG: 247 | Disposition: A | Discharge status: Home / Self Care | Payer: Medicare Other | Source: Other Acute Inpatient Hospital | Attending: Cardiovascular Disease | Admitting: Cardiovascular Disease

## 2018-10-02 ENCOUNTER — Encounter (HOSPITAL_COMMUNITY)
Admission: AD | Disposition: A | Discharge status: Home / Self Care | Payer: Self-pay | Source: Other Acute Inpatient Hospital | Attending: Cardiovascular Disease

## 2018-10-02 ENCOUNTER — Ambulatory Visit (HOSPITAL_COMMUNITY): Admit: 2018-10-02 | Payer: Medicare Other | Admitting: Internal Medicine

## 2018-10-02 ENCOUNTER — Encounter (HOSPITAL_COMMUNITY): Payer: Self-pay

## 2018-10-02 ENCOUNTER — Other Ambulatory Visit: Payer: Self-pay

## 2018-10-02 DIAGNOSIS — Z881 Allergy status to other antibiotic agents status: Secondary | ICD-10-CM | POA: Diagnosis not present

## 2018-10-02 DIAGNOSIS — F17200 Nicotine dependence, unspecified, uncomplicated: Secondary | ICD-10-CM | POA: Diagnosis present

## 2018-10-02 DIAGNOSIS — F17210 Nicotine dependence, cigarettes, uncomplicated: Secondary | ICD-10-CM | POA: Diagnosis present

## 2018-10-02 DIAGNOSIS — Z825 Family history of asthma and other chronic lower respiratory diseases: Secondary | ICD-10-CM

## 2018-10-02 DIAGNOSIS — Z20828 Contact with and (suspected) exposure to other viral communicable diseases: Secondary | ICD-10-CM | POA: Diagnosis not present

## 2018-10-02 DIAGNOSIS — Z888 Allergy status to other drugs, medicaments and biological substances status: Secondary | ICD-10-CM

## 2018-10-02 DIAGNOSIS — E109 Type 1 diabetes mellitus without complications: Secondary | ICD-10-CM

## 2018-10-02 DIAGNOSIS — Z955 Presence of coronary angioplasty implant and graft: Secondary | ICD-10-CM

## 2018-10-02 DIAGNOSIS — E1040 Type 1 diabetes mellitus with diabetic neuropathy, unspecified: Secondary | ICD-10-CM

## 2018-10-02 DIAGNOSIS — I214 Non-ST elevation (NSTEMI) myocardial infarction: Principal | ICD-10-CM | POA: Diagnosis present

## 2018-10-02 DIAGNOSIS — Z794 Long term (current) use of insulin: Secondary | ICD-10-CM

## 2018-10-02 DIAGNOSIS — I219 Acute myocardial infarction, unspecified: Secondary | ICD-10-CM | POA: Diagnosis present

## 2018-10-02 DIAGNOSIS — J45909 Unspecified asthma, uncomplicated: Secondary | ICD-10-CM | POA: Diagnosis not present

## 2018-10-02 DIAGNOSIS — Z91040 Latex allergy status: Secondary | ICD-10-CM | POA: Diagnosis not present

## 2018-10-02 DIAGNOSIS — I2510 Atherosclerotic heart disease of native coronary artery without angina pectoris: Secondary | ICD-10-CM | POA: Diagnosis present

## 2018-10-02 DIAGNOSIS — Z8249 Family history of ischemic heart disease and other diseases of the circulatory system: Secondary | ICD-10-CM

## 2018-10-02 DIAGNOSIS — F319 Bipolar disorder, unspecified: Secondary | ICD-10-CM | POA: Diagnosis present

## 2018-10-02 DIAGNOSIS — E785 Hyperlipidemia, unspecified: Secondary | ICD-10-CM | POA: Diagnosis present

## 2018-10-02 DIAGNOSIS — Z720 Tobacco use: Secondary | ICD-10-CM

## 2018-10-02 DIAGNOSIS — Z880 Allergy status to penicillin: Secondary | ICD-10-CM | POA: Diagnosis not present

## 2018-10-02 DIAGNOSIS — J449 Chronic obstructive pulmonary disease, unspecified: Secondary | ICD-10-CM

## 2018-10-02 DIAGNOSIS — R079 Chest pain, unspecified: Secondary | ICD-10-CM

## 2018-10-02 HISTORY — DX: Non-ST elevation (NSTEMI) myocardial infarction: I21.4

## 2018-10-02 HISTORY — DX: Acute myocardial infarction, unspecified: I21.9

## 2018-10-02 HISTORY — PX: LEFT HEART CATH AND CORONARY ANGIOGRAPHY: CATH118249

## 2018-10-02 HISTORY — PX: CORONARY STENT INTERVENTION: CATH118234

## 2018-10-02 LAB — CREATININE, SERUM
Creatinine, Ser: 0.6 mg/dL (ref 0.44–1.00)
GFR calc Af Amer: >60 mL/min (ref >60)
GFR calc non Af Amer: >60 mL/min (ref >60)

## 2018-10-02 LAB — GLUCOSE, CAPILLARY: Glucose-Capillary: 169 mg/dL — ABNORMAL HIGH (ref 70–99)

## 2018-10-02 LAB — CBC
HCT: 38.1 % (ref 36.0–46.0)
Hemoglobin: 12.9 g/dL (ref 12.0–15.0)
MCH: 29.9 pg (ref 26.0–34.0)
MCHC: 33.9 g/dL (ref 30.0–36.0)
MCV: 88.4 fL (ref 80.0–100.0)
Platelets: 289 10*3/uL (ref 150–400)
RBC: 4.31 MIL/uL (ref 3.87–5.11)
RDW: 13.3 % (ref 11.5–15.5)
WBC: 14.3 10*3/uL — ABNORMAL HIGH (ref 4.0–10.5)
nRBC: 0 % (ref 0.0–0.2)

## 2018-10-02 LAB — HEMOGLOBIN A1C
Hgb A1c MFr Bld: 8.4 % — ABNORMAL HIGH (ref 4.8–5.6)
Mean Plasma Glucose: 194.38 mg/dL

## 2018-10-02 LAB — SARS CORONAVIRUS 2 BY RT PCR (HOSPITAL ORDER, PERFORMED IN ~~LOC~~ HOSPITAL LAB): SARS Coronavirus 2: NEGATIVE

## 2018-10-02 LAB — POCT ACTIVATED CLOTTING TIME
Activated Clotting Time: 274 seconds
Activated Clotting Time: 307 seconds

## 2018-10-02 SURGERY — LEFT HEART CATH AND CORONARY ANGIOGRAPHY
Anesthesia: LOCAL

## 2018-10-02 MED ORDER — HYDRALAZINE HCL 20 MG/ML IJ SOLN: 10.0000 mg | INTRAMUSCULAR | Status: AC | PRN

## 2018-10-02 MED ORDER — LIDOCAINE HCL (PF) 1 % IJ SOLN
INTRAMUSCULAR | Status: DC | PRN
  Administered 2018-10-02: 2 mL via INTRADERMAL

## 2018-10-02 MED ORDER — ADULT MULTIVITAMIN W/MINERALS CH
1.0000 | ORAL_TABLET | Freq: Every day | ORAL | Status: DC
  Administered 2018-10-03 – 2018-10-04 (×2): 1 via ORAL
  Filled 2018-10-02 (×2): qty 1

## 2018-10-02 MED ORDER — SODIUM CHLORIDE 0.9% FLUSH: 3.0000 mL | INTRAVENOUS | Status: DC | PRN

## 2018-10-02 MED ORDER — BUPROPION HCL ER (XL) 300 MG PO TB24
450.0000 mg | ORAL_TABLET | Freq: Every day | ORAL | Status: DC
  Administered 2018-10-03 – 2018-10-04 (×2): 450 mg via ORAL
  Filled 2018-10-02 (×2): qty 1

## 2018-10-02 MED ORDER — NITROGLYCERIN 1 MG/10 ML FOR IR/CATH LAB
INTRA_ARTERIAL | Status: DC | PRN
  Administered 2018-10-02: 200 ug via INTRACORONARY

## 2018-10-02 MED ORDER — ASPIRIN EC 81 MG PO TBEC
81.0000 mg | DELAYED_RELEASE_TABLET | Freq: Every day | ORAL | Status: DC
  Administered 2018-10-03 – 2018-10-04 (×2): 81 mg via ORAL
  Filled 2018-10-02 (×2): qty 1

## 2018-10-02 MED ORDER — CARIPRAZINE HCL 3 MG PO CAPS
3.0000 mg | ORAL_CAPSULE | Freq: Every day | ORAL | Status: DC
  Administered 2018-10-02 – 2018-10-03 (×2): 3 mg via ORAL
  Filled 2018-10-02 (×2): qty 1

## 2018-10-02 MED ORDER — SODIUM CHLORIDE 0.9 % IV SOLN: 250.0000 mL | INTRAVENOUS | Status: DC | PRN

## 2018-10-02 MED ORDER — IPRATROPIUM-ALBUTEROL 0.5-2.5 (3) MG/3ML IN SOLN
3.0000 mL | Freq: Two times a day (BID) | RESPIRATORY_TRACT | Status: DC
  Administered 2018-10-02 – 2018-10-04 (×3): 3 mL via RESPIRATORY_TRACT
  Filled 2018-10-02 (×4): qty 3

## 2018-10-02 MED ORDER — SODIUM CHLORIDE 0.9 % IV SOLN
INTRAVENOUS | Status: AC | PRN
  Administered 2018-10-02: 50 mL/h via INTRAVENOUS

## 2018-10-02 MED ORDER — SODIUM CHLORIDE 0.9% FLUSH
3.0000 mL | Freq: Two times a day (BID) | INTRAVENOUS | Status: DC
  Administered 2018-10-02 – 2018-10-03 (×3): 3 mL via INTRAVENOUS

## 2018-10-02 MED ORDER — HEPARIN SODIUM (PORCINE) 1000 UNIT/ML IJ SOLN
INTRAMUSCULAR | Status: DC | PRN
  Administered 2018-10-02: 3500 [IU] via INTRAVENOUS
  Administered 2018-10-02: 2000 [IU] via INTRAVENOUS
  Administered 2018-10-02: 3500 [IU] via INTRAVENOUS

## 2018-10-02 MED ORDER — IOHEXOL 350 MG/ML SOLN
INTRAVENOUS | Status: DC | PRN
  Administered 2018-10-02: 19:00:00 115 mL via INTRA_ARTERIAL

## 2018-10-02 MED ORDER — VERAPAMIL HCL 2.5 MG/ML IV SOLN
INTRAVENOUS | Status: DC | PRN
  Administered 2018-10-02: 10 mL via INTRA_ARTERIAL

## 2018-10-02 MED ORDER — SODIUM CHLORIDE 0.9 % WEIGHT BASED INFUSION: 1.0000 mL/kg/h | INTRAVENOUS | Status: DC

## 2018-10-02 MED ORDER — MONTELUKAST SODIUM 10 MG PO TABS
10.0000 mg | ORAL_TABLET | Freq: Every day | ORAL | Status: DC
  Administered 2018-10-02 – 2018-10-03 (×2): 10 mg via ORAL
  Filled 2018-10-02 (×2): qty 1

## 2018-10-02 MED ORDER — BUDESONIDE 0.5 MG/2ML IN SUSP
0.5000 mg | Freq: Two times a day (BID) | RESPIRATORY_TRACT | Status: DC
  Administered 2018-10-02 – 2018-10-04 (×3): 0.5 mg via RESPIRATORY_TRACT
  Filled 2018-10-02 (×4): qty 2

## 2018-10-02 MED ORDER — ACETAMINOPHEN 325 MG PO TABS
650.0000 mg | ORAL_TABLET | ORAL | Status: DC | PRN
  Administered 2018-10-03: 650 mg via ORAL
  Filled 2018-10-02: qty 2

## 2018-10-02 MED ORDER — FENTANYL CITRATE (PF) 100 MCG/2ML IJ SOLN
INTRAMUSCULAR | Status: DC | PRN
  Administered 2018-10-02: 25 ug via INTRAVENOUS

## 2018-10-02 MED ORDER — TICAGRELOR 90 MG PO TABS
ORAL_TABLET | ORAL | Status: DC | PRN
  Administered 2018-10-02: 180 mg via ORAL

## 2018-10-02 MED ORDER — ATORVASTATIN CALCIUM 80 MG PO TABS
80.0000 mg | ORAL_TABLET | Freq: Every day | ORAL | Status: DC
  Administered 2018-10-03: 80 mg via ORAL
  Filled 2018-10-02: qty 1

## 2018-10-02 MED ORDER — GABAPENTIN 300 MG PO CAPS
300.0000 mg | ORAL_CAPSULE | Freq: Three times a day (TID) | ORAL | Status: DC
  Administered 2018-10-03 – 2018-10-04 (×2): 300 mg via ORAL
  Filled 2018-10-02 (×3): qty 1

## 2018-10-02 MED ORDER — SODIUM CHLORIDE 0.9 % IV SOLN
INTRAVENOUS | Status: AC
  Administered 2018-10-02: 1000 mL via INTRAVENOUS

## 2018-10-02 MED ORDER — TICAGRELOR 90 MG PO TABS
90.0000 mg | ORAL_TABLET | Freq: Two times a day (BID) | ORAL | Status: DC
  Administered 2018-10-03 – 2018-10-04 (×3): 90 mg via ORAL
  Filled 2018-10-02 (×3): qty 1

## 2018-10-02 MED ORDER — LABETALOL HCL 5 MG/ML IV SOLN: 10.0000 mg | INTRAVENOUS | Status: AC | PRN

## 2018-10-02 MED ORDER — HEPARIN (PORCINE) IN NACL 1000-0.9 UT/500ML-% IV SOLN
INTRAVENOUS | Status: DC | PRN
  Administered 2018-10-02 (×2): 500 mL

## 2018-10-02 MED ORDER — NICOTINE 14 MG/24HR TD PT24
14.0000 mg | MEDICATED_PATCH | Freq: Every day | TRANSDERMAL | Status: DC
  Administered 2018-10-03: 14 mg via TRANSDERMAL
  Filled 2018-10-02 (×2): qty 1

## 2018-10-02 MED ORDER — MIDAZOLAM HCL 2 MG/2ML IJ SOLN
INTRAMUSCULAR | Status: DC | PRN
  Administered 2018-10-02: 1 mg via INTRAVENOUS

## 2018-10-02 MED ORDER — ONDANSETRON HCL 4 MG/2ML IJ SOLN: 4.0000 mg | Freq: Four times a day (QID) | INTRAMUSCULAR | Status: DC | PRN

## 2018-10-02 MED ORDER — NITROGLYCERIN 0.4 MG SL SUBL: 0.4000 mg | SUBLINGUAL_TABLET | SUBLINGUAL | Status: DC | PRN

## 2018-10-02 MED ORDER — NITROGLYCERIN 1 MG/10 ML FOR IR/CATH LAB
INTRA_ARTERIAL | Status: AC
  Filled 2018-10-02: qty 10

## 2018-10-02 MED ORDER — LITHIUM CARBONATE ER 450 MG PO TBCR
450.0000 mg | EXTENDED_RELEASE_TABLET | Freq: Every day | ORAL | Status: DC
  Filled 2018-10-02: qty 1

## 2018-10-02 MED ORDER — INSULIN ASPART 100 UNIT/ML ~~LOC~~ SOLN
0.0000 [IU] | Freq: Three times a day (TID) | SUBCUTANEOUS | Status: DC
  Administered 2018-10-03: 13:00:00 8 [IU] via SUBCUTANEOUS
  Administered 2018-10-03 (×2): 11 [IU] via SUBCUTANEOUS
  Administered 2018-10-04: 15 [IU] via SUBCUTANEOUS

## 2018-10-02 MED ORDER — SODIUM CHLORIDE 0.9% FLUSH
3.0000 mL | Freq: Two times a day (BID) | INTRAVENOUS | Status: DC
  Administered 2018-10-03 (×2): 3 mL via INTRAVENOUS

## 2018-10-02 MED ORDER — ENOXAPARIN SODIUM 40 MG/0.4ML ~~LOC~~ SOLN
40.0000 mg | SUBCUTANEOUS | Status: DC
  Administered 2018-10-03 – 2018-10-04 (×2): 40 mg via SUBCUTANEOUS
  Filled 2018-10-02 (×2): qty 0.4

## 2018-10-02 MED ORDER — SODIUM CHLORIDE 0.9 % WEIGHT BASED INFUSION: 3.0000 mL/kg/h | INTRAVENOUS | Status: AC

## 2018-10-02 SURGICAL SUPPLY — 18 items
BAG SNAP BAND KOVER 36X36 (MISCELLANEOUS) ×1 IMPLANT
BALLN SAPPHIRE 1.5X12 (BALLOONS) ×2
BALLN SAPPHIRE ~~LOC~~ 2.25X8 (BALLOONS) ×1 IMPLANT
BALLOON SAPPHIRE 1.5X12 (BALLOONS) IMPLANT
CATH 5FR JL3.5 JR4 ANG PIG MP (CATHETERS) ×1 IMPLANT
CATH LAUNCHER 6FR EBU 3 (CATHETERS) ×1 IMPLANT
DEVICE RAD COMP TR BAND LRG (VASCULAR PRODUCTS) ×1 IMPLANT
GUIDEWIRE INQWIRE 1.5J.035X260 (WIRE) IMPLANT
INQWIRE 1.5J .035X260CM (WIRE) ×2
KIT ENCORE 26 ADVANTAGE (KITS) ×1 IMPLANT
KIT HEART LEFT (KITS) ×2 IMPLANT
PACK CARDIAC CATHETERIZATION (CUSTOM PROCEDURE TRAY) ×2 IMPLANT
SHEATH RAIN RADIAL 21G 6FR (SHEATH) ×1 IMPLANT
STENT RESOLUTE ONYX 2.25X18 (Permanent Stent) ×1 IMPLANT
SYR MEDRAD MARK 7 150ML (SYRINGE) ×2 IMPLANT
TRANSDUCER W/STOPCOCK (MISCELLANEOUS) ×2 IMPLANT
TUBING CIL FLEX 10 FLL-RA (TUBING) ×2 IMPLANT
WIRE RUNTHROUGH .014X180CM (WIRE) ×1 IMPLANT

## 2018-10-02 NOTE — Progress Notes (Signed)
Patient arrives from [facility] via Care link for NSTEMI,  On NTG drip at 3 ccs and Heparin at 10.5.  Patient wit complaint of bad head ache and chest pain.  PA at the bedside doing assessment

## 2018-10-02 NOTE — Progress Notes (Signed)
Patient to cath lab.  Assessment and Admission were not completed

## 2018-10-02 NOTE — Progress Notes (Addendum)
Nirtoglycerin drip increased to 4.5 mls from 3 mls

## 2018-10-02 NOTE — Progress Notes (Signed)
TR Band removed. Gauze and tegaderm placed. Site level 0.  Pulse 2.  Educated pt.  Arm placed on pillow.  Vs stable.  Will continue to monitor Saunders Revel T

## 2018-10-02 NOTE — Brief Op Note (Signed)
BRIEF CARDIAC CATHETERIZATION NOTE  10/02/2018  6:46 PM  PATIENT:  [name]  38 y.o. female  PRE-OPERATIVE DIAGNOSIS:  NSTEMI  POST-OPERATIVE DIAGNOSIS:  NSTEMI  PROCEDURE:  Procedure(s): LEFT HEART CATH AND CORONARY ANGIOGRAPHY (N/A) CORONARY STENT INTERVENTION (N/A)  SURGEON:  Surgeon(s) and Role:    * Durenda Pechacek, Harrell Gave, MD - Primary  PHYSICIAN ASSISTANT:   FINDINGS: 1. 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 2. Moderate LAD, proximal LCx, and RCA disease. 3. Mildly elevated LVEDP. 4. Normal LVEF. 5. Successful PCI to OM1 using Resolute Onyx 2.25 x 18 mm DES with 0% residual stenosis and TIMI-3 flow.  RECOMMENDATIONS: 1. DAPT with ASA and ticagrelor for at least 12 months. 2. Aggressive secondary prevention.  Nelva Bush, MD [facility] Pager: [phone]

## 2018-10-02 NOTE — Interval H&P Note (Signed)
History and Physical Interval Note:  10/02/2018 5:42 PM  Melody Myers  has presented today for cardiac catheterization, with the diagnosis of nonstemi.  The various methods of treatment have been discussed with the patient and family. After consideration of risks, benefits and other options for treatment, the patient has consented to  Procedure(s): LEFT HEART CATH AND CORONARY ANGIOGRAPHY (N/A) as a surgical intervention.  The patient's history has been reviewed, patient examined, no change in status, stable for surgery.  I have reviewed the patient's chart and labs.  Questions were answered to the patient's satisfaction.    Cath Lab Visit (complete for each Cath Lab visit)  Clinical Evaluation Leading to the Procedure:   ACS: Yes.    Non-ACS:  N/A  Korri Ask

## 2018-10-02 NOTE — H&P (Addendum)
Cardiology Admission History and Physical:   Patient ID: Melody Myers Neukam MRN: [id number]; DOB: 1980/08/03   Admission date: 10/02/2018  Primary Care Provider: Patient, No Pcp Per Primary Cardiologist: Dr. Windell MouldingKozlowski Chief Complaint: Chest pain  Patient Profile:   Melody Myers Traum is a 38 y.o. female with history of type 1 diabetes, tobacco use, bipolar disorder, family history of early CAD and asthma transferred from [facility] for non-STEMI.  History of Present Illness:   Ms. Laural BenesJohnson presented to [facility] last night with 3-day history of persistent substernal chest pressure radiating to her back.  Associated with shortness of breath and diaphoresis.  Her d-dimer was elevated.  CT Angie of the chest negative for PE.  She was admitted for further evaluation.  Initial troponin was 0.03 which trended up 0.22>>1.58>>2.88.  Patient was seen by Dr. Bing MatterKrasowski and [facility] to [facility] cardiac catheter[facility].  Sodium 136, potassium 3.9, BUN 7, creatinine 0.6, glucose 150, AST 21, ALT 15, lipase 86 Hemoglobin A1c 8.1 LDL 105, HDL 28, triglyceride level of 163 BNP normal  Patient reports admission to [facility] 3 weeks ago for hyperglycemia.  She felt better afterwards however for the past 3 days she started to having chest pain.  No covid test  done at [facility]. Confirmed by secretary on the floor.  Complains of fever and intermittent chills.  Afebrile at [facility].  Vital signs stable.  Patient has strong family history of CAD Brother had MI at age 36 Sister also diagnosed with CAD in her early 7020s Father had massive MI at age 36  On heparin. Currently 7/10 chest pressure   Heart Pathway Score:     Past Medical History:  Diagnosis Date  . Anxiety   . Asthma   . Bipolar 1 disorder   . Depression    patient reports bipolor  . Depression   . Diabetic neuropathy   . Insulin dependent diabetes mellitus with complications    insulin dependant  .  Syncopal episodes     Past Surgical History:  Procedure Laterality Date  . TUBAL LIGATION    . VAGINA RECONSTRUCTION SURGERY  10/2014   & ana, reconstruction     Medications Prior to Admission: Prior to Admission medications   Medication Sig Start Date End Date Taking? Authorizing Provider  acetaminophen (TYLENOL) 325 MG tablet Take 650 mg by mouth every 6 (six) hours as needed.    [provider]  ALPRAZolam Prudy Feeler(XANAX) 1 MG tablet Take 1 mg by mouth 4 (four) times daily.     [provider]  BELSOMRA 15 MG TABS Take 1 tablet by mouth at bedtime as needed. for sleep 09/06/14   [provider]  buPROPion (WELLBUTRIN XL) 150 MG 24 hr tablet Take 450 mg by mouth daily.    [provider]  esomeprazole (NEXIUM) 20 MG capsule Take 20 mg by mouth 2 (two) times daily before a meal.     [provider]  gabapentin (NEURONTIN) 300 MG capsule Take 300 mg by mouth 3 (three) times daily. 08/04/14   [provider]  HYDROcodone-acetaminophen (NORCO) 5-325 MG per tablet Take 1 tablet by mouth every 6 (six) hours as needed for moderate pain.    [provider]  ibuprofen (ADVIL,MOTRIN) 200 MG tablet Take 800 mg by mouth every 6 (six) hours as needed.    [provider]  insulin aspart (NOVOLOG) 100 UNIT/ML injection Inject 2-10 Units into the skin 3 (three) times daily before meals.  [provider]  Insulin Glargine (TOUJEO SOLOSTAR) 300 UNIT/ML SOPN Inject 28 Units into the skin daily.    [provider]  ipratropium-albuterol (DUONEB) 0.5-2.5 (3) MG/3ML SOLN Take 3 mLs by nebulization 2 (two) times daily.     [provider]  lithium carbonate (ESKALITH) 450 MG CR tablet Take 450 mg by mouth at bedtime.     [provider]  metroNIDAZOLE (FLAGYL) 250 MG tablet Take 250 mg by mouth 3 (three) times daily. For 7 days. Started on 11-11-14    [provider]  mometasone [facility]) 220 MCG/INH  inhaler Inhale 2 puffs into the lungs 2 (two) times daily.    [provider]  montelukast (SINGULAIR) 10 MG tablet Take 10 mg by mouth at bedtime.    [provider]  Multiple Vitamin (MULTIVITAMIN WITH MINERALS) TABS Take 1 tablet by mouth daily.    [provider]  naproxen (NAPROSYN) 500 MG tablet Take 1 tablet (500 mg total) by mouth 2 (two) times daily with a meal. 09/16/14   Noemi Chapel, MD  ondansetron (ZOFRAN ODT) 4 MG disintegrating tablet Take 1 tablet (4 mg total) by mouth every 8 (eight) hours as needed for nausea. 09/16/14   Noemi Chapel, MD  ondansetron (ZOFRAN) 4 MG tablet Take 4 mg by mouth every 8 (eight) hours as needed for nausea or vomiting.    [provider]  potassium chloride 20 MEQ TBCR Take 40 mEq by mouth daily. 11/15/14   Maryellen Pile, MD     Allergies:    Allergies  Allergen Reactions  . Amoxicillin Itching  . Latex Hives  . Penicillins Itching  . Prednisone Itching and Swelling    Social History:   Social History   Socioeconomic History  . Marital status: Married    Spouse name: Not on file  . Number of children: Not on file  . Years of education: Not on file  . Highest education level: Not on file  Occupational History  . Not on file  Social Needs  . Financial resource strain: Not on file  . Food insecurity    Worry: Not on file    Inability: Not on file  . Transportation needs    Medical: Not on file    Non-medical: Not on file  Tobacco Use  . Smoking status: Current Some Day Smoker    Packs/day: 0.25    Types: Cigarettes  . Smokeless tobacco: Never Used  Substance and Sexual Activity  . Alcohol use: Yes    Comment: occasional  . Drug use: No  . Sexual activity: Yes    Comment: tubal ligation  Lifestyle  . Physical activity    Days per week: Not on file    Minutes per session: Not on file  . Stress: Not on file  Relationships  . Social Herbalist on phone: Not on file    Gets  together: Not on file    Attends religious service: Not on file    Active member of club or organization: Not on file    Attends meetings of clubs or organizations: Not on file    Relationship status: Not on file  . Intimate partner violence    Fear of current or ex partner: Not on file    Emotionally abused: Not on file    Physically abused: Not on file    Forced sexual activity: Not on file  Other Topics Concern  . Not on file  Social  History Narrative  . Not on file    Family History:  AS summarized above  ROS:  Please see the history of present illness.  All other ROS reviewed and negative.     Physical Exam/Data:  There were no vitals filed for this visit. No intake or output data in the 24 hours ending 10/02/18 1642 Last 3 Weights 11/15/2014 11/14/2014 11/13/2014  Weight (lbs) 129 lb 8.3 oz 130 lb 12.8 oz 127 lb 8 oz  Weight (kg) 58.75 kg 59.33 kg 57.834 kg     There is no height or weight on file to calculate BMI.  General:  Well nourished, well developed anxious female in no acute distress HEENT: normal Lymph: no adenopathy Neck: no JVD Endocrine:  No thryomegaly Vascular: No carotid bruits; FA pulses 2+ bilaterally without bruits  Cardiac:  normal S1, S2; RRR; no murmur  Lungs:  clear to auscultation bilaterally, no wheezing, rhonchi or rales  Abd: soft, nontender, no hepatomegaly  Ext: no  edema Musculoskeletal:  No deformities, BUE and BLE strength normal and equal Skin: warm and dry  Neuro:  CNs 2-12 intact, no focal abnormalities noted Psych:  Normal affect    EKG:  The ECG that was done outside hospital was personally reviewed and demonstrates sinus rhythm at a ventricular rate of 75 bpm, no acute abnormality  Relevant CV Studies: Pending cardiac cath  Laboratory Data:  Reviewed outside hospital records  Radiology/Studies:  No results found.  Assessment and Plan:   1. Non-STEMI -3 days history of persistent substernal chest pressure radiating to  her back.  Peak of troponin 2.88.  Currently 7/10 chest pressure.  Pending COVID testing here.  Continue IV heparin -Plan cardiac catheterization later today - start ASA and Statin (LDL 105)  2.  Tobacco smoking -She is smoking 2 packs of cigarettes for past 20 years. -We will place on nicotine patch  3.  Type 1 diabetes mellitus -Sliding scale insulin while here -Hemoglobin A1c 8.1  Severity of Illness: The appropriate patient status for this patient is OBSERVATION. Observation status is judged to be reasonable and necessary in order to provide the required intensity of service to ensure the patient's safety. The patient's presenting symptoms, physical exam findings, and initial radiographic and laboratory data in the context of their medical condition is felt to place them at decreased risk for further clinical deterioration. Furthermore, it is anticipated that the patient will be medically stable for discharge from the hospital within 2 midnights of admission. The following factors support the patient status of observation.   " The patient's presenting symptoms include  Chest pain . " The physical exam findings include None " The initial radiographic and laboratory data are: elevated d-dimer     For questions or updates, please contact [facility] Please consult [email or web address] for contact info under        Lorelei PontSigned, Bhavinkumar Bhagat, PA  10/02/2018 4:42 PM   I have examined the patient and reviewed assessment and plan and discussed with patient.  Agree with above as stated.    GEN: Well nourished, well developed, in no acute distress ; sleepy but easily arousable and answers questons HEENT: normal  Neck: no JVD, carotid bruits, or masses Cardiac: RRR; no murmurs, rubs, or gallops,no edema , 2+ right radial pulse Respiratory:  clear to auscultation bilaterally, normal work of breathing; tolerating mask while lying flat GI: soft, nontender, nondistended,  MS: no deformity or  atrophy  Skin: warm and dry, no rash Neuro:  Strength and sensation are intact Psych: euthymic mood, flat affect  All questions about cath answered.  Strong family hisotiry of CAD with smoking history.  Plan for cath given that she is having ongoing chest pain.  Await COVID result.  D/w Dr. Jeral Fruit

## 2018-10-03 ENCOUNTER — Observation Stay (HOSPITAL_BASED_OUTPATIENT_CLINIC_OR_DEPARTMENT_OTHER): Payer: Medicare Other

## 2018-10-03 DIAGNOSIS — Z794 Long term (current) use of insulin: Secondary | ICD-10-CM | POA: Diagnosis not present

## 2018-10-03 DIAGNOSIS — F319 Bipolar disorder, unspecified: Secondary | ICD-10-CM | POA: Diagnosis present

## 2018-10-03 DIAGNOSIS — E785 Hyperlipidemia, unspecified: Secondary | ICD-10-CM | POA: Diagnosis present

## 2018-10-03 DIAGNOSIS — I214 Non-ST elevation (NSTEMI) myocardial infarction: Secondary | ICD-10-CM

## 2018-10-03 DIAGNOSIS — I2510 Atherosclerotic heart disease of native coronary artery without angina pectoris: Secondary | ICD-10-CM | POA: Diagnosis present

## 2018-10-03 DIAGNOSIS — Z8249 Family history of ischemic heart disease and other diseases of the circulatory system: Secondary | ICD-10-CM | POA: Diagnosis not present

## 2018-10-03 DIAGNOSIS — Z880 Allergy status to penicillin: Secondary | ICD-10-CM | POA: Diagnosis not present

## 2018-10-03 DIAGNOSIS — F17210 Nicotine dependence, cigarettes, uncomplicated: Secondary | ICD-10-CM | POA: Diagnosis present

## 2018-10-03 DIAGNOSIS — Z825 Family history of asthma and other chronic lower respiratory diseases: Secondary | ICD-10-CM | POA: Diagnosis not present

## 2018-10-03 DIAGNOSIS — Z881 Allergy status to other antibiotic agents status: Secondary | ICD-10-CM | POA: Diagnosis not present

## 2018-10-03 DIAGNOSIS — Z91040 Latex allergy status: Secondary | ICD-10-CM | POA: Diagnosis not present

## 2018-10-03 DIAGNOSIS — E1040 Type 1 diabetes mellitus with diabetic neuropathy, unspecified: Secondary | ICD-10-CM | POA: Diagnosis present

## 2018-10-03 DIAGNOSIS — J45909 Unspecified asthma, uncomplicated: Secondary | ICD-10-CM | POA: Diagnosis present

## 2018-10-03 DIAGNOSIS — Z20828 Contact with and (suspected) exposure to other viral communicable diseases: Secondary | ICD-10-CM | POA: Diagnosis present

## 2018-10-03 DIAGNOSIS — R079 Chest pain, unspecified: Secondary | ICD-10-CM | POA: Diagnosis present

## 2018-10-03 DIAGNOSIS — Z888 Allergy status to other drugs, medicaments and biological substances status: Secondary | ICD-10-CM | POA: Diagnosis not present

## 2018-10-03 LAB — CBC
HCT: 38.8 % (ref 36.0–46.0)
Hemoglobin: 12.8 g/dL (ref 12.0–15.0)
MCH: 29.4 pg (ref 26.0–34.0)
MCHC: 33 g/dL (ref 30.0–36.0)
MCV: 89 fL (ref 80.0–100.0)
Platelets: 261 10*3/uL (ref 150–400)
RBC: 4.36 MIL/uL (ref 3.87–5.11)
RDW: 13.3 % (ref 11.5–15.5)
WBC: 13.9 10*3/uL — ABNORMAL HIGH (ref 4.0–10.5)
nRBC: 0 % (ref 0.0–0.2)

## 2018-10-03 LAB — GLUCOSE, CAPILLARY
Glucose-Capillary: 321 mg/dL — ABNORMAL HIGH (ref 70–99)
Glucose-Capillary: 255 mg/dL — ABNORMAL HIGH (ref 70–99)
Glucose-Capillary: 302 mg/dL — ABNORMAL HIGH (ref 70–99)
Glucose-Capillary: 155 mg/dL — ABNORMAL HIGH (ref 70–99)

## 2018-10-03 LAB — BASIC METABOLIC PANEL
Anion gap: 9 (ref 5–15)
BUN: <5 mg/dL — ABNORMAL LOW (ref 6–20)
CO2: 24 mmol/L (ref 22–32)
Calcium: 8.9 mg/dL (ref 8.9–10.3)
Chloride: 104 mmol/L (ref 98–111)
Creatinine, Ser: 0.67 mg/dL (ref 0.44–1.00)
GFR calc Af Amer: >60 mL/min (ref >60)
GFR calc non Af Amer: >60 mL/min (ref >60)
Glucose, Bld: 277 mg/dL — ABNORMAL HIGH (ref 70–99)
Potassium: 4 mmol/L (ref 3.5–5.1)
Sodium: 137 mmol/L (ref 135–145)

## 2018-10-03 LAB — HIV ANTIBODY (ROUTINE TESTING W REFLEX): HIV Screen 4th Generation wRfx: NONREACTIVE

## 2018-10-03 LAB — MRSA PCR SCREENING: MRSA by PCR: NEGATIVE

## 2018-10-03 MED ORDER — METOPROLOL TARTRATE 25 MG PO TABS
25.0000 mg | ORAL_TABLET | Freq: Two times a day (BID) | ORAL | Status: DC
  Administered 2018-10-03 – 2018-10-04 (×3): 25 mg via ORAL
  Filled 2018-10-03 (×3): qty 1

## 2018-10-03 NOTE — Progress Notes (Signed)
CARDIAC REHAB PHASE I   PRE:  Rate/Rhythm: 96 SR asleep    BP: sitting 116/77    SaO2: 97 RA  MODE:  Ambulation: 410 ft   POST:  Rate/Rhythm: 133 ST in BR, 123 ST walking    BP: sitting 118/75     SaO2: 97 RA  Pt sts she is feeling well today, much improved. HR is elevated, 133 ST just going to bathroom. Sts she has historically had an elevated HR. BP is stable. No c/o walking halls. Discussed MI, stent, Brilinta, smoking cessation, diet, exercise, NTG, and CRPII. Pt voiced understanding, she is very motivated to quit smoking and take care of herself. Unfortunately she sts her husband just passed from heart failure.  Understands the importance of Brilinta. Will refer to [place], ACSM 10/03/2018 9:03 AM

## 2018-10-03 NOTE — Progress Notes (Signed)
Echocardiogram 2D Echocardiogram has been performed.  Oneal Deputy Cleotha Whalin 10/03/2018, 1:17 PM

## 2018-10-03 NOTE — Progress Notes (Signed)
Progress Note  Patient Name: Melody Myers Date of Encounter: 10/03/2018  Primary Cardiologist: No primary care provider on file.   Subjective   Currently feeling well.  No chest pain or shortness of breath.  Had a significant improvement after stenting from her left heart catheterization  Inpatient Medications    Scheduled Meds: . aspirin EC  81 mg Oral Daily  . atorvastatin  80 mg Oral q1800  . budesonide  0.5 mg Nebulization BID  . buPROPion  450 mg Oral Daily  . cariprazine  3 mg Oral Daily  . enoxaparin (LOVENOX) injection  40 mg Subcutaneous Q24H  . gabapentin  300 mg Oral TID  . insulin aspart  0-15 Units Subcutaneous TID WC  . ipratropium-albuterol  3 mL Nebulization BID  . montelukast  10 mg Oral QHS  . multivitamin with minerals  1 tablet Oral Daily  . nicotine  14 mg Transdermal Daily  . sodium chloride flush  3 mL Intravenous Q12H  . sodium chloride flush  3 mL Intravenous Q12H  . ticagrelor  90 mg Oral BID   Continuous Infusions: . sodium chloride    . sodium chloride    . sodium chloride     PRN Meds: sodium chloride, sodium chloride, acetaminophen, nitroGLYCERIN, ondansetron (ZOFRAN) IV, sodium chloride flush, sodium chloride flush   Vital Signs    Vitals:   10/02/18 2300 10/02/18 2330 10/03/18 0345 10/03/18 0750  BP: 95/62 103/65 (!) 95/58 116/77  Pulse: 89 96 86 (!) 108  Resp: (!) 21 (!) 21 20 (!) 21  Temp: 98.3 F (36.8 C)  98 F (36.7 C) 98.5 F (36.9 C)  TempSrc: Oral  Oral Oral  SpO2: 97%  98% 98%  Weight:      Height:        Intake/Output Summary (Last 24 hours) at 10/03/2018 0910 Last data filed at 10/03/2018 1093 Gross per 24 hour  Intake 1136.74 ml  Output -  Net 1136.74 ml   Last 3 Weights 10/02/2018 10/02/2018 11/15/2014  Weight (lbs) 157 lb 10.1 oz 159 lb 13.3 oz 129 lb 8.3 oz  Weight (kg) 71.5 kg 72.5 kg 58.75 kg      Telemetry    Sinus tachycardia- Personally Reviewed  ECG    Sinus rhythm- Personally Reviewed  Physical  Exam   GEN: No acute distress.   Neck: No JVD Cardiac:  Tachycardic, regular, no murmurs, rubs, or gallops.  Respiratory: Clear to auscultation bilaterally. GI: Soft, nontender, non-distended  MS: No edema; No deformity. Neuro:  Nonfocal  Psych: Normal affect   Labs    High Sensitivity Troponin:  No results for input(s): TROPONINIHS in the last 720 hours.    Cardiac EnzymesNo results for input(s): TROPONINI in the last 168 hours. No results for input(s): TROPIPOC in the last 168 hours.   Chemistry Recent Labs  Lab 10/02/18 2009 10/03/18 0221  NA  --  137  K  --  4.0  CL  --  104  CO2  --  24  GLUCOSE  --  277*  BUN  --  <5*  CREATININE 0.60 0.67  CALCIUM  --  8.9  GFRNONAA >60 >60  GFRAA >60 >60  ANIONGAP  --  9     Hematology Recent Labs  Lab 10/02/18 2009 10/03/18 0221  WBC 14.3* 13.9*  RBC 4.31 4.36  HGB 12.9 12.8  HCT 38.1 38.8  MCV 88.4 89.0  MCH 29.9 29.4  MCHC 33.9 33.0  RDW 13.3 13.3  PLT 289 261    BNPNo results for input(s): BNP, PROBNP in the last 168 hours.   DDimer No results for input(s): DDIMER in the last 168 hours.   Radiology    No results found.  Cardiac Studies   Left heart cath 1. 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 2. Moderate LAD, proximal LCx, and RCA disease. 3. Mildly elevated LVEDP. 4. Normal LVEF. 5. Successful PCI to OM1 using Resolute Onyx 2.25 x 18 mm DES with 0% residual stenosis and TIMI-3 flow.  Patient Profile     38 y.o. female with a history significant for type 1 diabetes, tobacco abuse, bipolar disorder, family history of coronary disease who presented to the hospital with a non-STEMI.  Assessment & Plan    1.  Non-STEMI: Patient is status post OM 1 stent.  No current chest pain.  We Blakelee Allington continue aspirin and ticagrelor for 1 year.  2.  Hyperlipidemia: Goal LDL less than 70.  Continue high-dose atorvastatin.  3.  Tobacco abuse: Had a long discussion of cessation.   Discussion lasted 10 minutes.  4.  Type 1 diabetes: We Aeron Lheureux need tight blood glucose control now that we know she has significant coronary artery disease.  Continue sliding scale insulin.  For questions or updates, please contact [facility] Please consult [email or web address] for contact info under        Signed, Earnestine Shipp Jorja Loa, MD  10/03/2018, 9:10 AM

## 2018-10-04 ENCOUNTER — Encounter (HOSPITAL_COMMUNITY): Payer: Self-pay | Admitting: Physician Assistant

## 2018-10-04 DIAGNOSIS — E785 Hyperlipidemia, unspecified: Secondary | ICD-10-CM

## 2018-10-04 HISTORY — DX: Hyperlipidemia, unspecified: E78.5

## 2018-10-04 LAB — BASIC METABOLIC PANEL
Anion gap: 13 (ref 5–15)
BUN: 9 mg/dL (ref 6–20)
CO2: 21 mmol/L — ABNORMAL LOW (ref 22–32)
Calcium: 9.3 mg/dL (ref 8.9–10.3)
Chloride: 98 mmol/L (ref 98–111)
Creatinine, Ser: 0.81 mg/dL (ref 0.44–1.00)
GFR calc Af Amer: >60 mL/min (ref >60)
GFR calc non Af Amer: >60 mL/min (ref >60)
Glucose, Bld: 475 mg/dL — ABNORMAL HIGH (ref 70–99)
Potassium: 4.4 mmol/L (ref 3.5–5.1)
Sodium: 132 mmol/L — ABNORMAL LOW (ref 135–145)

## 2018-10-04 LAB — GLUCOSE, CAPILLARY
Glucose-Capillary: 461 mg/dL — ABNORMAL HIGH (ref 70–99)
Glucose-Capillary: 468 mg/dL — ABNORMAL HIGH (ref 70–99)

## 2018-10-04 LAB — CBC
HCT: 42.8 % (ref 36.0–46.0)
Hemoglobin: 14.4 g/dL (ref 12.0–15.0)
MCH: 29.9 pg (ref 26.0–34.0)
MCHC: 33.6 g/dL (ref 30.0–36.0)
MCV: 89 fL (ref 80.0–100.0)
Platelets: 253 10*3/uL (ref 150–400)
RBC: 4.81 MIL/uL (ref 3.87–5.11)
RDW: 13.2 % (ref 11.5–15.5)
WBC: 10.9 10*3/uL — ABNORMAL HIGH (ref 4.0–10.5)
nRBC: 0 % (ref 0.0–0.2)

## 2018-10-04 MED ORDER — TICAGRELOR 90 MG PO TABS: 90.0000 mg | ORAL_TABLET | Freq: Two times a day (BID) | ORAL | 3 refills | Status: DC

## 2018-10-04 MED ORDER — NICOTINE 14 MG/24HR TD PT24: 14.0000 mg | MEDICATED_PATCH | Freq: Every day | TRANSDERMAL | 0 refills | Status: DC

## 2018-10-04 MED ORDER — METOPROLOL TARTRATE 25 MG PO TABS: 25.0000 mg | ORAL_TABLET | Freq: Two times a day (BID) | ORAL | 3 refills | Status: DC

## 2018-10-04 MED ORDER — ATORVASTATIN CALCIUM 80 MG PO TABS: 80.0000 mg | ORAL_TABLET | Freq: Every day | ORAL | 3 refills | Status: DC

## 2018-10-04 MED ORDER — ASPIRIN 81 MG PO TBEC: 81.0000 mg | DELAYED_RELEASE_TABLET | Freq: Every day | ORAL | Status: AC

## 2018-10-04 MED ORDER — NITROGLYCERIN 0.4 MG SL SUBL: 0.4000 mg | SUBLINGUAL_TABLET | SUBLINGUAL | 3 refills | Status: DC | PRN

## 2018-10-04 MED ORDER — INSULIN GLARGINE 100 UNIT/ML ~~LOC~~ SOLN
21.0000 [IU] | Freq: Once | SUBCUTANEOUS | Status: DC
  Filled 2018-10-04: qty 0.21

## 2018-10-04 MED ORDER — TICAGRELOR 90 MG PO TABS: 90.0000 mg | ORAL_TABLET | Freq: Two times a day (BID) | ORAL | 0 refills | Status: DC

## 2018-10-04 NOTE — Plan of Care (Signed)

## 2018-10-04 NOTE — Progress Notes (Signed)
Inpatient Diabetes Program Recommendations  AACE/ADA: New Consensus Statement on Inpatient Glycemic Control (2015)  Target Ranges:  Prepandial:   less than 140 mg/dL      Peak postprandial:   less than 180 mg/dL (1-2 hours)      Critically ill patients:  140 - 180 mg/dL   Results for KALIYA, SHREINER (MRN [id number]) as of 10/04/2018 10:50  Ref. Range 10/03/2018 06:16 10/03/2018 11:11 10/03/2018 16:15 10/03/2018 21:11 10/04/2018 03:56 10/04/2018 06:15  Glucose-Capillary Latest Ref Range: 70 - 99 mg/dL 321 (H) 255 (H) 302 (H) 155 (H) 461 (H) 468 (H)   Review of Glycemic Control  Diabetes history: DM1 (makes NO insulin; requires basal, correction, and carbohydrate coverage insulin) Outpatient Diabetes medications: Basaglar 32 units QHS, Novolog 10-15 units TID with meals Current orders for Inpatient glycemic control: Novolog 0-15 units TID with meals  Inpatient Diabetes Program Recommendations:   Insulin-Basal: Please consider ordering Lantus 21 units x1 now (and daily if pt will remain inpatient).  NOTE: Noted consult for Diabetes Coordinator. Diabetes Coordinator is not on campus over the weekend but available by pager from 8am to 5pm for questions or concerns. Patient has Type 1 DM and makes NO insulin at all. Noted no basal insulin has been ordered or given since admitted. Fasting glucose 468 mg/dl this morning and has been given Novolog 15 units for correction. Noted patient is to be discharged today. Recommend ordering Lantus 21 units x1 now (based on 71.5 kg x 0.3 units). Sent chat message to M. Isaac Laud, [place] and order placed. Called unit to talk with patient but patient has already been discharged. Called patient over the phone (her cell phone not working so called friend Linton Ham [phone] in chart and was able to talk to patient directly). Explained that she has not received any basal insulin since being admitted and noted glucose of 468 mg/dl this morning. Asked that she take Basaglar when she gets  home since she has not had any basal insulin in over 24 hours and Novolog she was given will only work for 4-6 hours. Patient states she will check her glucose as soon as she gets home and will take her basal insulin and additional Novolog if needed. Encouraged patient to keep a close check on glucose today. Patient verbalized understanding and appreciative of call.  Thanks, Barnie Alderman, RN, MSN, CDE Diabetes Coordinator Inpatient Diabetes Program [phone] (Team Pager from 8am to 5pm)

## 2018-10-04 NOTE — Progress Notes (Signed)
Pt contacted , Bs 270mg /dl, will take insulin at home.

## 2018-10-04 NOTE — Care Management (Signed)
Pt provided with Brilinta 30 day free card.  Patient's pharmacy is open 10-6 today.

## 2018-10-04 NOTE — Progress Notes (Signed)
Notified by Mathis Bud, our diabetic coordinator, that the patient's glucose level is >400, advised for insulin correction prior to discharge. I have ordered 21 units of Lantus prior to discharge.

## 2018-10-04 NOTE — Progress Notes (Signed)
Pt left unit in wheelchair for home.

## 2018-10-04 NOTE — Progress Notes (Signed)
Progress Note  Patient Name: Melody Myers Date of Encounter: 10/04/2018  Primary Cardiologist: No primary care provider on file.   Subjective   Currently feeling well.  No chest pain or shortness of breath.  Inpatient Medications    Scheduled Meds: . aspirin EC  81 mg Oral Daily  . atorvastatin  80 mg Oral q1800  . budesonide  0.5 mg Nebulization BID  . buPROPion  450 mg Oral Daily  . cariprazine  3 mg Oral Daily  . enoxaparin (LOVENOX) injection  40 mg Subcutaneous Q24H  . gabapentin  300 mg Oral TID  . insulin aspart  0-15 Units Subcutaneous TID WC  . ipratropium-albuterol  3 mL Nebulization BID  . metoprolol tartrate  25 mg Oral BID  . montelukast  10 mg Oral QHS  . multivitamin with minerals  1 tablet Oral Daily  . nicotine  14 mg Transdermal Daily  . sodium chloride flush  3 mL Intravenous Q12H  . sodium chloride flush  3 mL Intravenous Q12H  . ticagrelor  90 mg Oral BID   Continuous Infusions: . sodium chloride    . sodium chloride    . sodium chloride     PRN Meds: sodium chloride, sodium chloride, acetaminophen, nitroGLYCERIN, ondansetron (ZOFRAN) IV, sodium chloride flush, sodium chloride flush   Vital Signs    Vitals:   10/03/18 2342 10/04/18 0315 10/04/18 0727 10/04/18 0757  BP: 98/65 97/63 96/62    Pulse: 81 85 (!) 101 (!) 108  Resp: 18 19 17    Temp: 98.2 F (36.8 C) 98.1 F (36.7 C) 98.2 F (36.8 C)   TempSrc: Oral Oral Oral   SpO2: 100% 100% 98%   Weight:      Height:        Intake/Output Summary (Last 24 hours) at 10/04/2018 0850 Last data filed at 10/04/2018 0809 Gross per 24 hour  Intake 760 ml  Output -  Net 760 ml   Last 3 Weights 10/02/2018 10/02/2018 11/15/2014  Weight (lbs) 157 lb 10.1 oz 159 lb 13.3 oz 129 lb 8.3 oz  Weight (kg) 71.5 kg 72.5 kg 58.75 kg      Telemetry    Sinus rhythm- Personally Reviewed  ECG  None new- Personally Reviewed  Physical Exam   GEN: Well nourished, well developed, in no acute distress  HEENT:  normal  Neck: no JVD, carotid bruits, or masses Cardiac: RRR; no murmurs, rubs, or gallops,no edema  Respiratory:  clear to auscultation bilaterally, normal work of breathing GI: soft, nontender, nondistended, + BS MS: no deformity or atrophy  Skin: warm and dry Neuro:  Strength and sensation are intact Psych: euthymic mood, full affect   Labs    High Sensitivity Troponin:  No results for input(s): TROPONINIHS in the last 720 hours.    Cardiac EnzymesNo results for input(s): TROPONINI in the last 168 hours. No results for input(s): TROPIPOC in the last 168 hours.   Chemistry Recent Labs  Lab 10/02/18 2009 10/03/18 0221 10/04/18 0230  NA  --  137 132*  K  --  4.0 4.4  CL  --  104 98  CO2  --  24 21*  GLUCOSE  --  277* 475*  BUN  --  <5* 9  CREATININE 0.60 0.67 0.81  CALCIUM  --  8.9 9.3  GFRNONAA >60 >60 >60  GFRAA >60 >60 >60  ANIONGAP  --  9 13     Hematology Recent Labs  Lab 10/02/18 2009 10/03/18 0221 10/04/18  0230  WBC 14.3* 13.9* 10.9*  RBC 4.31 4.36 4.81  HGB 12.9 12.8 14.4  HCT 38.1 38.8 42.8  MCV 88.4 89.0 89.0  MCH 29.9 29.4 29.9  MCHC 33.9 33.0 33.6  RDW 13.3 13.3 13.2  PLT 289 261 253    BNPNo results for input(s): BNP, PROBNP in the last 168 hours.   DDimer No results for input(s): DDIMER in the last 168 hours.   Radiology    No results found.  Cardiac Studies   Left heart cath 1. 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 2. Moderate LAD, proximal LCx, and RCA disease. 3. Mildly elevated LVEDP. 4. Normal LVEF. 5. Successful PCI to OM1 using Resolute Onyx 2.25 x 18 mm DES with 0% residual stenosis and TIMI-3 flow.  Patient Profile     38 y.o. female with a history significant for type 1 diabetes, tobacco abuse, bipolar disorder, family history of coronary disease who presented to the hospital with a non-STEMI.  Assessment & Plan    1.  Non-STEMI: Status post OM1 stent.  No current chest pain.    continue aspirin ticagrelor for 1 year.  She  need follow-up with general cardiology in [place].  2.  Hyperlipidemia: Goal LDL less than 70.  Continue high-dose atorvastatin  3.  Tobacco abuse: Smoking cessation encouraged  4.  Type 1 diabetes: We  need outpatient tight control of blood glucose.  For questions or updates, please contact [place] Please consult [email or web address] for contact info under        Signed,  Meredith Leeds, MD  10/04/2018, 8:50 AM

## 2018-10-04 NOTE — Discharge Summary (Addendum)
Discharge Summary    Patient ID: Melody Myers MRN: [id number]; DOB: Aug 12, 1980  Admit date: 10/02/2018 Discharge date: 10/04/2018  Primary Care Provider: Patient, No Pcp Per  Primary Cardiologist: Gypsy Balsam, MD  Primary Electrophysiologist:  None   Discharge Diagnoses    Principal Problem:   Non-ST elevation (NSTEMI) myocardial infarction [facility]) Active Problems:   Insulin dependent diabetes mellitus with complications (HCC)   Tobacco abuse   NSTEMI (non-ST elevated myocardial infarction) (HCC)   Hyperlipidemia LDL goal <70   Allergies Allergies  Allergen Reactions  . Amoxicillin Itching  . Latex Hives  . Penicillins Hives, Itching and Swelling    Did it involve swelling of the face/tongue/throat, SOB, or low BP? No Did it involve sudden or severe rash/hives, skin peeling, or any reaction on the inside of your mouth or nose? No Did you need to seek medical attention at a hospital or doctor's office? No When did it last happen?Childhood If all above answers are "NO", may proceed with cephalosporin use.  . Prednisone Itching and Swelling    Diagnostic Studies/Procedures    Cath 10/02/2018 Conclusions: 1. 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 2. Moderate LAD, proximal LCx, and RCA disease. 3. Mildly elevated LVEDP. 4. Normal LVEF. 5. Successful PCI to OM1 using Resolute Onyx 2.25 x 18 mm drug-eluting stent with 0% residual stenosis and TIMI-3 flow.  Recommendations: 1. Dual antiplatelet therapy with aspirin and ticagrelor for at least 12 months. 2. Aggressive secondary prevention.   Echo 10/03/2018 IMPRESSIONS  1. The left ventricle has normal systolic function, with an ejection fraction of 55-60%. The cavity size was normal. Left ventricular diastolic parameters were normal.  2. Cannot rule out basal to mid inferolateral wall motion abnormality but endocardial segments are not well visualized. Suggest limited study with definity  contrast.  3. The right ventricle has normal systolic function. The cavity was normal. There is no increase in right ventricular wall thickness. Right ventricular systolic pressure could not be assessed.  4. The aorta is normal in size and structure. _____________   History of Present Illness     Melody Myers presented to [facility] last night with 3-day history of persistent substernal chest pressure radiating to her back.  Associated with shortness of breath and diaphoresis.  Her d-dimer was elevated.  CT Angie of the chest negative for PE.  She was admitted for further evaluation.  Initial troponin was 0.03 which trended up 0.22>>1.58>>2.88.  Patient was seen by Dr. Bing Matter and sent to [facility] for cardiac catheterization.  Sodium 136, potassium 3.9, BUN 7, creatinine 0.6, glucose 150, AST 21, ALT 15, lipase 86 Hemoglobin A1c 8.1 LDL 105, HDL 28, triglyceride level of 163 BNP normal  Patient reports admission to [facility] 3 weeks ago for hyperglycemia.  She felt better afterwards however for the past 3 days she started to having chest pain.  No covid test  done at [facility]. Confirmed by secretary on the floor.  Complains of fever and intermittent chills.  Afebrile at [place].  Vital signs stable.  Patient has strong family history of CAD Brother had MI at age 40 Sister also diagnosed with CAD in her early 29s Father had massive MI at age 34  On heparin. Currently 7/10 chest pressure   Hospital Course     Consultants: N/A  Patient was transferred from [facility] to [facility] for further evaluation.  Cardiac catheterization performed on 10/02/2018 showed severe single-vessel CAD with thrombotic occlusion  of proximal OM1, moderate LAD, proximal left circumflex and RCA disease.  OM1 lesion was successfully treated with resolute Onyx 2.25 x 18 mm DES.  Echocardiogram obtained on 10/03/2018 shows EF of 55 to 60%, normal RVEF.   Postprocedure, patient was placed on aspirin and Brilinta.  He was also placed on high-dose statin.  Hemoglobin A1c obtained during this admission was 8.4.  She was seen in the morning of 10/04/2018 by Dr. Elberta Fortisamnitz at which time she was doing well from cardiology perspective.  She is deemed stable for discharge.  She was at advised to stop smoking.  LDL goal Tashon Capp be less than 70, she Wyeth Hoffer need a fasting lipid panel and LFT in 6 to 8 weeks.  I Maverick Dieudonne arrange cardiology follow-up in 1 week with our cardiology team in [place].  _____________  Discharge Vitals Blood pressure 96/62, pulse (!) 108, temperature 98.2 F (36.8 C), temperature source Oral, resp. rate 17, height 5\' 4"  (1.626 m), weight 71.5 kg, last menstrual period 10/02/2018, SpO2 99 %.  Filed Weights   10/02/18 1700 10/02/18 2111  Weight: 72.5 kg 71.5 kg    Labs & Radiologic Studies    CBC Recent Labs    10/03/18 0221 10/04/18 0230  WBC 13.9* 10.9*  HGB 12.8 14.4  HCT 38.8 42.8  MCV 89.0 89.0  PLT 261 253   Basic Metabolic Panel Recent Labs    40/98/1106/10/15 0221 10/04/18 0230  NA 137 132*  K 4.0 4.4  CL 104 98  CO2 24 21*  GLUCOSE 277* 475*  BUN <5* 9  CREATININE 0.67 0.81  CALCIUM 8.9 9.3   Liver Function Tests No results for input(s): AST, ALT, ALKPHOS, BILITOT, PROT, ALBUMIN in the last 72 hours. No results for input(s): LIPASE, AMYLASE in the last 72 hours. Cardiac Enzymes No results for input(s): CKTOTAL, CKMB, CKMBINDEX, TROPONINI in the last 72 hours. BNP Invalid input(s): POCBNP D-Dimer No results for input(s): DDIMER in the last 72 hours. Hemoglobin A1C Recent Labs    10/02/18 2009  HGBA1C 8.4*   Fasting Lipid Panel No results for input(s): CHOL, HDL, LDLCALC, TRIG, CHOLHDL, LDLDIRECT in the last 72 hours. Thyroid Function Tests No results for input(s): TSH, T4TOTAL, T3FREE, THYROIDAB in the last 72 hours.  Invalid input(s): FREET3 _____________  No results found. Disposition   Pt is being  discharged home today in good condition.  Follow-up Plans & Appointments    Follow-up Information    Georgeanna LeaKrasowski, Robert J, MD Follow up.   Specialty: Cardiology Why: cardiology scheduler Noha Karasik contact you to arrange a followup, please give us a call if do not hear from our scheduler in 3 business days.  Contact information: [address] [place] [id number] [phone]          Discharge Instructions    Amb Referral to Cardiac Rehabilitation   Complete by: As directed    To [place]   Diagnosis:  Coronary Stents NSTEMI PTCA     After initial evaluation and assessments completed: Virtual Based Care may be provided alone or in conjunction with Phase 2 Cardiac Rehab based on patient barriers.: Yes      Discharge Medications   Allergies as of 10/04/2018      Reactions   Amoxicillin Itching   Latex Hives   Penicillins Hives, Itching, Swelling   Did it involve swelling of the face/tongue/throat, SOB, or low BP? No Did it involve sudden or severe rash/hives, skin peeling, or any reaction on the inside of your mouth  or nose? No Did you need to seek medical attention at a hospital or doctor's office? No When did it last happen?Childhood If all above answers are "NO", may proceed with cephalosporin use.   Prednisone Itching, Swelling      Medication List    TAKE these medications   acetaminophen 325 MG tablet Commonly known as: TYLENOL Take 650 mg by mouth every 6 (six) hours as needed.   ALPRAZolam 1 MG tablet Commonly known as: XANAX Take 1 mg by mouth 2 (two) times daily.   aspirin 81 MG EC tablet Take 1 tablet (81 mg total) by mouth daily. Start taking on: October 05, 2018   atorvastatin 80 MG tablet Commonly known as: LIPITOR Take 1 tablet (80 mg total) by mouth daily at 6 PM.   Basaglar KwikPen 100 UNIT/ML Sopn Inject 32 Units into the skin at bedtime.   buPROPion 150 MG 24 hr tablet Commonly known as: WELLBUTRIN XL Take 150 mg by mouth daily.    clotrimazole 1 % cream Commonly known as: LOTRIMIN Apply 1 application topically 2 (two) times daily.   doxycycline 100 MG capsule Commonly known as: VIBRAMYCIN Take 100 mg by mouth as needed (spot on her Groin).   Fluticasone-Salmeterol 250-50 MCG/DOSE Aepb Commonly known as: ADVAIR Inhale 1 puff into the lungs 2 (two) times daily.   Glucagon Emergency 1 MG injection Generic drug: glucagon Inject 1 mg as directed once.   insulin aspart 100 UNIT/ML injection Commonly known as: novoLOG Inject 10-15 Units into the skin 4 (four) times daily. Sliding scale   ipratropium-albuterol 0.5-2.5 (3) MG/3ML Soln Commonly known as: DUONEB Take 3 mLs by nebulization 2 (two) times daily.   metoprolol tartrate 25 MG tablet Commonly known as: LOPRESSOR Take 1 tablet (25 mg total) by mouth 2 (two) times daily.   montelukast 10 MG tablet Commonly known as: SINGULAIR Take 10 mg by mouth at bedtime.   multivitamin with minerals Tabs tablet Take 1 tablet by mouth daily.   nicotine 14 mg/24hr patch Commonly known as: NICODERM CQ - dosed in mg/24 hours Place 1 patch (14 mg total) onto the skin daily.   nitroGLYCERIN 0.4 MG SL tablet Commonly known as: NITROSTAT Place 1 tablet (0.4 mg total) under the tongue every 5 (five) minutes x 3 doses as needed for chest pain.   omeprazole 40 MG capsule Commonly known as: PRILOSEC Take 40 mg by mouth daily.   ondansetron 4 MG disintegrating tablet Commonly known as: Zofran ODT Take 1 tablet (4 mg total) by mouth every 8 (eight) hours as needed for nausea.   ticagrelor 90 MG Tabs tablet Commonly known as: BRILINTA Take 1 tablet (90 mg total) by mouth 2 (two) times daily.   Ventolin HFA 108 (90 Base) MCG/ACT inhaler Generic drug: albuterol Inhale 2 puffs into the lungs every 4 (four) hours as needed.   Vraylar capsule Generic drug: cariprazine Take 1.5 mg by mouth daily.        Acute coronary syndrome (MI, NSTEMI, STEMI, etc) this  admission?: Yes.     AHA/ACC Clinical Performance & Quality Measures: 1. Aspirin prescribed? - Yes 2. ADP Receptor Inhibitor (Plavix/Clopidogrel, Brilinta/Ticagrelor or Effient/Prasugrel) prescribed (includes medically managed patients)? - Yes 3. Beta Blocker prescribed? - Yes 4. High Intensity Statin (Lipitor 40-80mg  or Crestor 20-40mg ) prescribed? - Yes 5. EF assessed during THIS hospitalization? - Yes 6. For EF <40%, was ACEI/ARB prescribed? - Not Applicable (EF >/= 84%) 7. For EF <40%, Aldosterone Antagonist (Spironolactone or Eplerenone) prescribed? -  Not Applicable (EF >/= 40%) 8. Cardiac Rehab Phase II ordered (Included Medically managed Patients)? - Yes     Outstanding Labs/Studies   FLP and LFT in 6-8 weeks  Duration of Discharge Encounter   Greater than 30 minutes including physician time.  SignedAzalee Course, Hao Meng, PA 10/04/2018, 9:28 AM    I have seen and examined this patient with Azalee CourseHao Meng.  Agree with above, note added to reflect my findings.  On exam, RRR, no murmurs, lungs clear.  Patient into the hospital with non-STEMI.  Is now status post OM1 stent.  Does have significant other disease in the LAD and RCA.  We Rama Mcclintock plan for discharge home today with follow-up in clinic.  Azel Gumina M. Shavonta Gossen MD 10/04/2018 11:40 AM

## 2018-10-04 NOTE — Progress Notes (Signed)
Blood sugar checked at the request of patient at 0356 was 461. Cardiologist notified.

## 2018-10-05 ENCOUNTER — Encounter (HOSPITAL_COMMUNITY): Payer: Self-pay | Admitting: Internal Medicine

## 2018-10-14 ENCOUNTER — Other Ambulatory Visit: Payer: Self-pay

## 2018-10-14 ENCOUNTER — Encounter: Payer: Self-pay | Admitting: Cardiology

## 2018-10-14 ENCOUNTER — Ambulatory Visit (INDEPENDENT_AMBULATORY_CARE_PROVIDER_SITE_OTHER): Payer: Medicare Other | Admitting: Cardiology

## 2018-10-14 VITALS — BP 112/76 | HR 87 | Ht 64.0 in | Wt 155.0 lb

## 2018-10-14 DIAGNOSIS — Z720 Tobacco use: Secondary | ICD-10-CM | POA: Diagnosis not present

## 2018-10-14 DIAGNOSIS — IMO0001 Reserved for inherently not codable concepts without codable children: Secondary | ICD-10-CM

## 2018-10-14 DIAGNOSIS — E785 Hyperlipidemia, unspecified: Secondary | ICD-10-CM | POA: Diagnosis not present

## 2018-10-14 DIAGNOSIS — Z794 Long term (current) use of insulin: Secondary | ICD-10-CM

## 2018-10-14 DIAGNOSIS — E118 Type 2 diabetes mellitus with unspecified complications: Secondary | ICD-10-CM

## 2018-10-14 DIAGNOSIS — I2510 Atherosclerotic heart disease of native coronary artery without angina pectoris: Secondary | ICD-10-CM

## 2018-10-14 HISTORY — DX: Atherosclerotic heart disease of native coronary artery without angina pectoris: I25.10

## 2018-10-14 NOTE — Progress Notes (Signed)
Cardiology Office Note:    Date:  10/14/2018   ID:  Melody Ramsayracy Obar, DOB [birth year], MRN [id number]  PCP:  Laurel DimmerKruppenbach, Katherine H, FNP  Cardiologist:  Gypsy Balsamobert Krasowski, MD    Referring MD: No ref. provider found   Chief Complaint  Patient presents with  . Hospitalization Follow-up  Doing well  History of Present Illness:    Melody Myers is a 38 y.o. female who presented to round of hospital few weeks ago with atypical symptoms.  She did have some chest pain as well.  Troponin I was done came abnormal and [facility].  Cardiac catheterization showed completely occluded obtuse marginal branch.  That was addressed with stenting also she got 50% blockages in other arteries.  Since the time of hospitalization and stenting she is doing well she described to have some exertional shortness of breath but overall seems to be doing well.  She quit smoking.  She does have multiple risk factors for coronary artery disease namely smoking, diabetes, dyslipidemia.  All being addressed appropriately.  We spent a great deal of time talking today about what happened and what can we do to prevent her to have a problem in the future.  We talked about good diet exercise and need to take medications.  She is scheduled to see her endocrinologist to improve controlling of her diabetes which will be very beneficial.  We stressed importance of taking dual antiplatelet therapy for at least 1 year.  Past Medical History:  Diagnosis Date  . Anxiety   . Asthma   . Bipolar 1 disorder (HCC)   . Depression    patient reports bipolor  . Depression   . Diabetic neuropathy (HCC)   . Insulin dependent diabetes mellitus with complications (HCC)    insulin dependant  . Syncopal episodes     Past Surgical History:  Procedure Laterality Date  . CORONARY STENT INTERVENTION N/A 10/02/2018   Procedure: CORONARY STENT INTERVENTION;  Surgeon: Yvonne KendallEnd, Christopher, MD;  Location: [facility]  INVASIVE CV LAB;  Service: Cardiovascular;  Laterality: N/A;  . LEFT HEART CATH AND CORONARY ANGIOGRAPHY N/A 10/02/2018   Procedure: LEFT HEART CATH AND CORONARY ANGIOGRAPHY;  Surgeon: Yvonne KendallEnd, Christopher, MD;  Location: [facility] INVASIVE CV LAB;  Service: Cardiovascular;  Laterality: N/A;  . TUBAL LIGATION    . VAGINA RECONSTRUCTION SURGERY  10/2014   & ana, reconstruction    Current Medications: Current Meds  Medication Sig  . acetaminophen (TYLENOL) 325 MG tablet Take 650 mg by mouth every 6 (six) hours as needed.  . ALPRAZolam (XANAX) 1 MG tablet Take 1 mg by mouth 2 (two) times daily.   Marland Kitchen. aspirin EC 81 MG EC tablet Take 1 tablet (81 mg total) by mouth daily.  Marland Kitchen. atorvastatin (LIPITOR) 80 MG tablet Take 1 tablet (80 mg total) by mouth daily at 6 PM.  . buPROPion (WELLBUTRIN XL) 150 MG 24 hr tablet Take 150 mg by mouth daily.   . clotrimazole (LOTRIMIN) 1 % cream Apply 1 application topically 2 (two) times daily.  Marland Kitchen. doxycycline (VIBRAMYCIN) 100 MG capsule Take 100 mg by mouth as needed (spot on her Groin).   . Fluticasone-Salmeterol (ADVAIR) 250-50 MCG/DOSE AEPB Inhale 1 puff into the lungs 2 (two) times daily.  Marland Kitchen. GLUCAGON EMERGENCY 1 MG injection Inject 1 mg as directed once.  . insulin aspart (NOVOLOG) 100 UNIT/ML injection Inject 10-15 Units into the skin 4 (four) times daily. Sliding scale  . Insulin Glargine [facility](BASAGLAR KWIKPEN)  100 UNIT/ML SOPN Inject 32 Units into the skin at bedtime.  Marland Kitchen ipratropium-albuterol (DUONEB) 0.5-2.5 (3) MG/3ML SOLN Take 3 mLs by nebulization 2 (two) times daily.   . metoprolol tartrate (LOPRESSOR) 25 MG tablet Take 1 tablet (25 mg total) by mouth 2 (two) times daily.  . montelukast (SINGULAIR) 10 MG tablet Take 10 mg by mouth at bedtime.  . Multiple Vitamin (MULTIVITAMIN WITH MINERALS) TABS Take 1 tablet by mouth daily.  . nitroGLYCERIN (NITROSTAT) 0.4 MG SL tablet Place 1 tablet (0.4 mg total) under the tongue every 5 (five) minutes x 3 doses as needed for chest  pain.  Marland Kitchen omeprazole (PRILOSEC) 40 MG capsule Take 40 mg by mouth daily.  . ondansetron (ZOFRAN ODT) 4 MG disintegrating tablet Take 1 tablet (4 mg total) by mouth every 8 (eight) hours as needed for nausea.  . ticagrelor (BRILINTA) 90 MG TABS tablet Take 1 tablet (90 mg total) by mouth 2 (two) times daily.  . VENTOLIN HFA 108 (90 Base) MCG/ACT inhaler Inhale 2 puffs into the lungs every 4 (four) hours as needed.  Marland Kitchen VRAYLAR capsule Take 1.5 mg by mouth daily.     Allergies:   Amoxicillin, Latex, Penicillins, and Prednisone   Social History   Socioeconomic History  . Marital status: Single    Spouse name: Not on file  . Number of children: Not on file  . Years of education: Not on file  . Highest education level: Not on file  Occupational History  . Not on file  Social Needs  . Financial resource strain: Hard  . Food insecurity    Worry: Often true    Inability: Sometimes true  . Transportation needs    Medical: Yes    Non-medical: Yes  Tobacco Use  . Smoking status: Former Smoker    Packs/day: 0.25    Types: Cigarettes  . Smokeless tobacco: Never Used  Substance and Sexual Activity  . Alcohol use: Yes    Comment: occasional  . Drug use: No  . Sexual activity: Yes    Comment: tubal ligation  Lifestyle  . Physical activity    Days per week: 1 day    Minutes per session: 30 min  . Stress: Very much  Relationships  . Social connections    Talks on phone: More than three times a week    Gets together: More than three times a week    Attends religious service: Never    Active member of club or organization: No    Attends meetings of clubs or organizations: Never    Relationship status: Widowed  Other Topics Concern  . Not on file  Social History Narrative  . Not on file     Family History: The patient's family history includes Heart attack (age of onset: 54) in her brother; Heart attack (age of onset: 34) in her father. ROS:   Please see the history of present  illness.    All 14 point review of systems negative except as described per history of present illness  EKGs/Labs/Other Studies Reviewed:     Cardiac catheterization from 10/02/2018 1. 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 2. Moderate LAD, proximal LCx, and RCA disease. 3. Mildly elevated LVEDP. 4. Normal LVEF. 5. Successful PCI to OM1 using Resolute Onyx 2.25 x 18 mm drug-eluting stent with 0% residual stenosis and TIMI-3 flow.   Recent Labs: 10/04/2018: BUN 9; Creatinine, Ser 0.81; Hemoglobin 14.4; Platelets 253; Potassium 4.4; Sodium 132  Recent Lipid  Panel No results found for: CHOL, TRIG, HDL, CHOLHDL, VLDL, LDLCALC, LDLDIRECT  Physical Exam:    VS:  BP 112/76   Pulse 87   Ht 5\' 4"  (1.626 m)   Wt 155 lb (70.3 kg)   LMP 10/02/2018   SpO2 99%   BMI 26.61 kg/m     Wt Readings from Last 3 Encounters:  10/14/18 155 lb (70.3 kg)  10/02/18 157 lb 10.1 oz (71.5 kg)  11/15/14 129 lb 8.3 oz (58.7 kg)     GEN:  Well nourished, well developed in no acute distress HEENT: Normal NECK: No JVD; No carotid bruits LYMPHATICS: No lymphadenopathy CARDIAC: RRR, no murmurs, no rubs, no gallops RESPIRATORY:  Clear to auscultation without rales, wheezing or rhonchi  ABDOMEN: Soft, non-tender, non-distended MUSCULOSKELETAL:  No edema; No deformity  SKIN: Warm and dry LOWER EXTREMITIES: no swelling NEUROLOGIC:  Alert and oriented x 3 PSYCHIATRIC:  Normal affect   ASSESSMENT:    1. Insulin dependent diabetes mellitus with complications (HCC)   2. Coronary artery disease involving native coronary artery of native heart without angina pectoris   3. Tobacco abuse   4. Hyperlipidemia LDL goal <70    PLAN:    In order of problems listed above:  6. Coronary artery disease status post PTCA and stenting of obtuse marginal branch in the face of acute non-STEMI.  Doing well from that point review appropriate medications will continue. 7. Diabetes mellitus: She  is scheduled to see endocrinologist which is very appropriate move.  Last hemoglobin A1c was in the neighborhood of 8.  Need to be better controlled. 8. Tobacco abuse luckily she quit smoking when she was in the hospital encouraged her to stay away from it 9. Dyslipidemia we will schedule her to have fasting lipid profile done in a month.  She is on high intensity statin which is Lipitor 80 which I will continue.  We discussed healthy lifestyle need to exercise as well as good diet.  We discussed basic principles of Mediterranean diet.   Medication Adjustments/Labs and Tests Ordered: Current medicines are reviewed at length with the patient today.  Concerns regarding medicines are outlined above.  No orders of the defined types were placed in this encounter.  Medication changes: No orders of the defined types were placed in this encounter.   Signed, Georgeanna Lea, MD, [facility] 10/14/2018 9:09 AM    [facility]

## 2018-10-14 NOTE — Patient Instructions (Signed)
Medication Instructions:  Your physician recommends that you continue on your current medications as directed. Please refer to the Current Medication list given to you today.  If you need a refill on your cardiac medications before your next appointment, please call your pharmacy.   Lab work: NONE  If you have labs (blood work) drawn today and your tests are completely normal, you will receive your results only by: Marland Kitchen MyChart Message (if you have MyChart) OR . A paper copy in the mail If you have any lab test that is abnormal or we need to change your treatment, we will call you to review the results.  Testing/Procedures: NONE  Follow-Up: At [facility], you and your health needs are our priority.  As part of our continuing mission to provide you with exceptional heart care, we have created designated Provider Care Teams.  These Care Teams include your primary Cardiologist (physician) and Advanced Practice Providers (APPs -  Physician Assistants and Nurse Practitioners) who all work together to provide you with the care you need, when you need it. You will need a follow up appointment in 1 months.  You may see Jenne Campus, MD or another member of our [place] Provider Team in [place]: Shirlee More, MD . Jyl Heinz, MD  Any Other Special Instructions Will Be Listed Below (If Applicable).

## 2018-10-19 ENCOUNTER — Telehealth: Payer: Self-pay | Admitting: Cardiology

## 2018-10-19 DIAGNOSIS — I214 Non-ST elevation (NSTEMI) myocardial infarction: Secondary | ICD-10-CM

## 2018-10-19 NOTE — Telephone Encounter (Signed)
Patient called in reporting left neck and shoulder pain that began this morning when she woke up. She describes the pain as constant and throbbing. She does report some shortness of breath. No chest pain. She recently had a heart attack and is concerned. After consulting with Dr. Agustin Cree she was advised to have troponin drawn today and go to emergency room if pain progresses. She verbally understands. No further questions.

## 2018-10-20 LAB — TROPONIN I: Troponin I: <0.01 ng/mL (ref 0.00–0.04)

## 2018-10-31 ENCOUNTER — Emergency Department (HOSPITAL_COMMUNITY)
Admission: EM | Admit: 2018-10-31 | Discharge: 2018-11-01 | Disposition: A | Discharge status: Home / Self Care | Payer: Medicare Other | Attending: Emergency Medicine | Admitting: Emergency Medicine

## 2018-10-31 ENCOUNTER — Emergency Department (HOSPITAL_COMMUNITY): Payer: Medicare Other

## 2018-10-31 ENCOUNTER — Encounter (HOSPITAL_COMMUNITY): Payer: Self-pay

## 2018-10-31 ENCOUNTER — Other Ambulatory Visit: Payer: Self-pay

## 2018-10-31 DIAGNOSIS — J45909 Unspecified asthma, uncomplicated: Secondary | ICD-10-CM | POA: Diagnosis not present

## 2018-10-31 DIAGNOSIS — E1140 Type 2 diabetes mellitus with diabetic neuropathy, unspecified: Secondary | ICD-10-CM | POA: Diagnosis not present

## 2018-10-31 DIAGNOSIS — I25118 Atherosclerotic heart disease of native coronary artery with other forms of angina pectoris: Secondary | ICD-10-CM | POA: Insufficient documentation

## 2018-10-31 DIAGNOSIS — R0602 Shortness of breath: Secondary | ICD-10-CM | POA: Diagnosis not present

## 2018-10-31 DIAGNOSIS — I252 Old myocardial infarction: Secondary | ICD-10-CM | POA: Insufficient documentation

## 2018-10-31 DIAGNOSIS — Z794 Long term (current) use of insulin: Secondary | ICD-10-CM | POA: Insufficient documentation

## 2018-10-31 DIAGNOSIS — I208 Other forms of angina pectoris: Secondary | ICD-10-CM

## 2018-10-31 DIAGNOSIS — Z7982 Long term (current) use of aspirin: Secondary | ICD-10-CM | POA: Insufficient documentation

## 2018-10-31 DIAGNOSIS — Z79899 Other long term (current) drug therapy: Secondary | ICD-10-CM | POA: Insufficient documentation

## 2018-10-31 DIAGNOSIS — Z87891 Personal history of nicotine dependence: Secondary | ICD-10-CM | POA: Insufficient documentation

## 2018-10-31 DIAGNOSIS — R079 Chest pain, unspecified: Secondary | ICD-10-CM | POA: Diagnosis present

## 2018-10-31 DIAGNOSIS — Z91040 Latex allergy status: Secondary | ICD-10-CM | POA: Insufficient documentation

## 2018-10-31 LAB — COMPREHENSIVE METABOLIC PANEL
ALT: 25 U/L (ref 0–44)
AST: 18 U/L (ref 15–41)
Albumin: 4.3 g/dL (ref 3.5–5.0)
Alkaline Phosphatase: 94 U/L (ref 38–126)
Anion gap: 7 (ref 5–15)
BUN: 11 mg/dL (ref 6–20)
CO2: 27 mmol/L (ref 22–32)
Calcium: 9.6 mg/dL (ref 8.9–10.3)
Chloride: 103 mmol/L (ref 98–111)
Creatinine, Ser: 0.78 mg/dL (ref 0.44–1.00)
GFR calc Af Amer: >60 mL/min (ref >60)
GFR calc non Af Amer: >60 mL/min (ref >60)
Glucose, Bld: 244 mg/dL — ABNORMAL HIGH (ref 70–99)
Potassium: 4.1 mmol/L (ref 3.5–5.1)
Sodium: 137 mmol/L (ref 135–145)
Total Bilirubin: 0.3 mg/dL (ref 0.3–1.2)
Total Protein: 7.8 g/dL (ref 6.5–8.1)

## 2018-10-31 LAB — CBC WITH DIFFERENTIAL/PLATELET
Abs Immature Granulocytes: 0.14 10*3/uL — ABNORMAL HIGH (ref 0.00–0.07)
Basophils Absolute: 0.1 10*3/uL (ref 0.0–0.1)
Basophils Relative: 1 %
Eosinophils Absolute: 0.2 10*3/uL (ref 0.0–0.5)
Eosinophils Relative: 2 %
HCT: 42 % (ref 36.0–46.0)
Hemoglobin: 13.8 g/dL (ref 12.0–15.0)
Immature Granulocytes: 1 %
Lymphocytes Relative: 35 %
Lymphs Abs: 4 10*3/uL (ref 0.7–4.0)
MCH: 29.6 pg (ref 26.0–34.0)
MCHC: 32.9 g/dL (ref 30.0–36.0)
MCV: 90.1 fL (ref 80.0–100.0)
Monocytes Absolute: 0.7 10*3/uL (ref 0.1–1.0)
Monocytes Relative: 6 %
Neutro Abs: 6.3 10*3/uL (ref 1.7–7.7)
Neutrophils Relative %: 55 %
Platelets: 303 10*3/uL (ref 150–400)
RBC: 4.66 MIL/uL (ref 3.87–5.11)
RDW: 13.1 % (ref 11.5–15.5)
WBC: 11.4 10*3/uL — ABNORMAL HIGH (ref 4.0–10.5)
nRBC: 0 % (ref 0.0–0.2)

## 2018-10-31 LAB — TROPONIN I (HIGH SENSITIVITY)
Troponin I (High Sensitivity): 3 ng/L (ref <18)
Troponin I (High Sensitivity): 3 ng/L (ref <18)

## 2018-10-31 LAB — CBG MONITORING, ED
Glucose-Capillary: 229 mg/dL — ABNORMAL HIGH (ref 70–99)
Glucose-Capillary: 146 mg/dL — ABNORMAL HIGH (ref 70–99)

## 2018-10-31 LAB — I-STAT BETA HCG BLOOD, ED (MC, WL, AP ONLY): I-stat hCG, quantitative: <5 m[IU]/mL (ref <5)

## 2018-10-31 LAB — BRAIN NATRIURETIC PEPTIDE: B Natriuretic Peptide: 66.4 pg/mL (ref 0.0–100.0)

## 2018-10-31 MED ORDER — ISOSORBIDE MONONITRATE ER 30 MG PO TB24: 30.0000 mg | ORAL_TABLET | Freq: Every day | ORAL | 0 refills | Status: DC

## 2018-10-31 NOTE — Discharge Instructions (Signed)
Take imdur 30 mg daily. If you have headaches, take tylenol with it   See Dr. Agustin Cree for follow up   Return to ER if you have worse chest pain, trouble breathing, shortness of breath

## 2018-10-31 NOTE — ED Notes (Signed)
Pt states she feels like her blood sugar is dropping and does not feel well

## 2018-10-31 NOTE — ED Triage Notes (Signed)
Pt reports L sided chest tightness, SOB, and ankle swelling that started this morning. Pt had a NSTEMI last month and a stent placed. Pt states that Cards said that she had 2 more blockages, but they weren't at 75% yet so they were left alone. Pt also reports new bruising on her R chest.

## 2018-10-31 NOTE — ED Provider Notes (Signed)
[facility]-EMERGENCY DEPT Provider Note   CSN: [id number] Arrival date & time: 10/31/18  2006     History   Chief Complaint Chief Complaint  Patient presents with  . Chest Pain  . Shortness of Breath    HPI Melody Myers is a 38 y.o. female hx of bipolar, depression, diabetes, here presenting with chest pain, leg and arm swelling.  Patient states that she had a end STEMI and a stent placed to the proximal OM1.  She states that since then she has been noticing some chest pain with exertion. She did follow up with Dr. Bing MatterKrasowski 2 weeks ago the shortness of breath with exertion was noted back then.  She noticed some leg swelling and bilateral arm swelling as well.  She has some left-sided chest pressure that is persistent so she wants to come here and be evaluated.     The history is provided by the patient.    Past Medical History:  Diagnosis Date  . Anxiety   . Asthma   . Bipolar 1 disorder (HCC)   . Depression    patient reports bipolor  . Depression   . Diabetic neuropathy (HCC)   . Insulin dependent diabetes mellitus with complications (HCC)    insulin dependant  . Syncopal episodes     Patient Active Problem List   Diagnosis Date Noted  . 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 10/14/2018  . Hyperlipidemia LDL goal <70 10/04/2018  . Non-ST elevation (NSTEMI) myocardial infarction (HCC) 10/02/2018  . NSTEMI (non-ST elevated myocardial infarction) (HCC) 10/02/2018  . Hypokalemia 11/15/2014  . Urinary retention 11/14/2014  . Superficial incisional surgical site infection 11/14/2014  . Leukocytosis 03/20/2013  . Bipolar depression (HCC) 03/20/2013  . Chronic pain 03/20/2013  . Tobacco abuse 03/20/2013  . Diabetic neuropathy (HCC)   . Insulin dependent diabetes mellitus with complications (HCC)   . DKA (diabetic ketoacidoses) (HCC) 03/19/2013  . Asthma   . Anxiety   . Depression   .  Neuropathy     Past Surgical History:  Procedure Laterality Date  . CORONARY STENT INTERVENTION N/A 10/02/2018   Procedure: CORONARY STENT INTERVENTION;  Surgeon: Yvonne KendallEnd, Christopher, MD;  Location: [facility] INVASIVE CV LAB;  Service: Cardiovascular;  Laterality: N/A;  . LEFT HEART CATH AND CORONARY ANGIOGRAPHY N/A 10/02/2018   Procedure: LEFT HEART CATH AND CORONARY ANGIOGRAPHY;  Surgeon: Yvonne KendallEnd, Christopher, MD;  Location: [facility] INVASIVE CV LAB;  Service: Cardiovascular;  Laterality: N/A;  . TUBAL LIGATION    . VAGINA RECONSTRUCTION SURGERY  10/2014   & ana, reconstruction     OB History   No obstetric history on file.      Home Medications    Prior to Admission medications   Medication Sig Start Date End Date Taking? Authorizing Provider  acetaminophen (TYLENOL) 325 MG tablet Take 650 mg by mouth every 6 (six) hours as needed.    [provider]  ALPRAZolam Prudy Feeler(XANAX) 1 MG tablet Take 1 mg by mouth 2 (two) times daily.     [provider]  aspirin EC 81 MG EC tablet Take 1 tablet (81 mg total) by mouth daily. 10/05/18   Azalee CourseMeng, Hao, PA  atorvastatin (LIPITOR) 80 MG tablet Take 1 tablet (80 mg total) by mouth daily at 6 PM. 10/04/18   Azalee CourseMeng, Hao, PA  buPROPion (WELLBUTRIN XL) 150 MG 24 hr tablet Take 150 mg by mouth daily.     [provider]  clotrimazole (LOTRIMIN) 1 %  cream Apply 1 application topically 2 (two) times daily. 09/16/18   [provider]  doxycycline (VIBRAMYCIN) 100 MG capsule Take 100 mg by mouth as needed (spot on her Groin).  09/16/18   [provider]  Fluticasone-Salmeterol (ADVAIR) 250-50 MCG/DOSE AEPB Inhale 1 puff into the lungs 2 (two) times daily. 04/10/18   [provider]  GLUCAGON EMERGENCY 1 MG injection Inject 1 mg as directed once. 05/28/18   [provider]  insulin aspart (NOVOLOG) 100 UNIT/ML injection Inject 10-15 Units into the skin 4 (four) times daily. Sliding scale    [provider]  Insulin Glargine  (BASAGLAR KWIKPEN) 100 UNIT/ML SOPN Inject 32 Units into the skin at bedtime.    [provider]  ipratropium-albuterol (DUONEB) 0.5-2.5 (3) MG/3ML SOLN Take 3 mLs by nebulization 2 (two) times daily.     [provider]  isosorbide mononitrate (IMDUR) 30 MG 24 hr tablet Take 1 tablet (30 mg total) by mouth daily. 10/31/18   Charlynne Pander, MD  metoprolol tartrate (LOPRESSOR) 25 MG tablet Take 1 tablet (25 mg total) by mouth 2 (two) times daily. 10/04/18   Azalee Course, PA  montelukast (SINGULAIR) 10 MG tablet Take 10 mg by mouth at bedtime.    [provider]  Multiple Vitamin (MULTIVITAMIN WITH MINERALS) TABS Take 1 tablet by mouth daily.    [provider]  nitroGLYCERIN (NITROSTAT) 0.4 MG SL tablet Place 1 tablet (0.4 mg total) under the tongue every 5 (five) minutes x 3 doses as needed for chest pain. 10/04/18   Azalee Course, PA  omeprazole (PRILOSEC) 40 MG capsule Take 40 mg by mouth daily. 08/19/18   [provider]  ondansetron (ZOFRAN ODT) 4 MG disintegrating tablet Take 1 tablet (4 mg total) by mouth every 8 (eight) hours as needed for nausea. 09/16/14   Eber Hong, MD  ticagrelor (BRILINTA) 90 MG TABS tablet Take 1 tablet (90 mg total) by mouth 2 (two) times daily. 10/04/18   Azalee Course, PA  VENTOLIN HFA 108 (90 Base) MCG/ACT inhaler Inhale 2 puffs into the lungs every 4 (four) hours as needed. 04/07/18   [provider]  VRAYLAR capsule Take 1.5 mg by mouth daily. 08/24/18   [provider]    Family History Family History  Problem Relation Age of Onset  . Heart attack Father 3  . Heart attack Brother 98    Social History Social History   Tobacco Use  . Smoking status: Former Smoker    Packs/day: 0.25    Types: Cigarettes  . Smokeless tobacco: Never Used  Substance Use Topics  . Alcohol use: Yes    Comment: occasional  . Drug use: No     Allergies   Amoxicillin, Latex, Penicillins, and Prednisone   Review of  Systems Review of Systems  Respiratory: Positive for shortness of breath.   Cardiovascular: Positive for chest pain.  All other systems reviewed and are negative.    Physical Exam Updated Vital Signs BP 108/77 (BP Location: Left Arm)   Pulse 78   Temp 98.1 F (36.7 C) (Oral)   Resp 20   Ht 5\' 4"  (1.626 m)   Wt 70.3 kg   LMP 10/02/2018   SpO2 98%   BMI 26.61 kg/m   Physical Exam Vitals signs and nursing note reviewed.  Constitutional:      Appearance: She is well-developed.     Comments: Slightly anxious   HENT:     Head: Normocephalic.  Eyes:  Pupils: Pupils are equal, round, and reactive to light.  Neck:     Musculoskeletal: Normal range of motion.  Cardiovascular:     Rate and Rhythm: Normal rate and regular rhythm.     Heart sounds: Normal heart sounds.  Pulmonary:     Breath sounds: Normal breath sounds.  Abdominal:     General: Bowel sounds are normal.     Palpations: Abdomen is soft.  Musculoskeletal: Normal range of motion.     Comments: Trace edema bilateral arms and legs   Skin:    General: Skin is warm.     Capillary Refill: Capillary refill takes less than 2 seconds.  Neurological:     General: No focal deficit present.     Mental Status: She is alert and oriented to person, place, and time.  Psychiatric:        Mood and Affect: Mood normal.        Behavior: Behavior normal.      ED Treatments / Results  Labs (all labs ordered are listed, but only abnormal results are displayed) Labs Reviewed  CBC WITH DIFFERENTIAL/PLATELET - Abnormal; Notable for the following components:      Result Value   WBC 11.4 (*)    Abs Immature Granulocytes 0.14 (*)    All other components within normal limits  COMPREHENSIVE METABOLIC PANEL - Abnormal; Notable for the following components:   Glucose, Bld 244 (*)    All other components within normal limits  CBG MONITORING, ED - Abnormal; Notable for the following components:   Glucose-Capillary 229 (*)     All other components within normal limits  BRAIN NATRIURETIC PEPTIDE  I-STAT BETA HCG BLOOD, ED ([facility], [facility], [facility] ONLY)  TROPONIN I (HIGH SENSITIVITY)  TROPONIN I (HIGH SENSITIVITY)    EKG EKG Interpretation  Date/Time:  Saturday October 31 2018 20:16:00 EDT Ventricular Rate:  80 PR Interval:    QRS Duration: 93 QT Interval:  376 QTC Calculation: 434 R Axis:   -23 Text Interpretation:  Sinus rhythm Borderline left axis deviation Abnormal R-wave progression, early transition No significant change since last tracing Confirmed by Wandra Arthurs [phone]) on 10/31/2018 9:16:18 PM   Radiology Dg Chest 2 View  Result Date: 10/31/2018 CLINICAL DATA:  Left chest tightness and shortness of breath with ankle swelling since this morning. Ex-smoker. EXAM: CHEST - 2 VIEW COMPARISON:  10/01/2018. FINDINGS: Normal sized heart. Clear lungs. Mild diffuse peribronchial thickening. Normal vascularity. Unremarkable bones. IMPRESSION: Mild bronchitic changes. Electronically Signed   By: Claudie Revering M.D.   On: 10/31/2018 20:35    Procedures Procedures (including critical care time)  Medications Ordered in ED Medications - No data to display   Initial Impression / Assessment and Plan / ED Course  I have reviewed the triage vital signs and the nursing notes.  Pertinent labs & imaging results that were available during my care of the patient were reviewed by me and considered in my medical decision making (see chart for details).       Melody Myers is a 38 y.o. female here presenting with left-sided chest tightness, ankle and arm swelling.  She does have some shortness of breath with exertion since the stent that was documented 2 weeks ago by her cardiologist in the office.  She does not have any symptoms at rest.  [facility] if she had some stable angina after stent but I doubt any in-stent stenosis.  Will check labs including troponin and BNP and chest x-ray. Will call  cardiology and she may benefit from imdur  for symptomatic relief.    9:30 PM Trop neg x 1. BNP and kidney functions normal. I talked to Dr. Mayford Knifeurner. She recommend that if delta troponin is negative, she can be discharged home with Imdur and can follow up with Dr. Bing MatterKrasowski outpatient.  11 PM  Signed out to Dr. Rhunette CroftNanavati to follow up delta trop. Anticipate dc home with imdur 30 mg daily if negative.   Final Clinical Impressions(s) / ED Diagnoses   Final diagnoses:  Stable angina [facility](HCC)    ED Discharge Orders         Ordered    isosorbide mononitrate (IMDUR) 30 MG 24 hr tablet  Daily     10/31/18 2256           Charlynne PanderYao,  Hsienta, MD 11/01/18 1451

## 2018-10-31 NOTE — ED Notes (Signed)
Patient transported to X-ray 

## 2018-11-01 NOTE — ED Provider Notes (Signed)
Delta Trop negative.  Patient does not have chest pain on reassessment. Stable for discharge. Strict ER return precautions have been discussed, and patient is agreeing with the plan and is comfortable with the workup done and the recommendations from the ER.    Varney Biles, MD 11/01/18 [phone]

## 2018-11-26 ENCOUNTER — Ambulatory Visit (INDEPENDENT_AMBULATORY_CARE_PROVIDER_SITE_OTHER): Payer: Medicare Other | Admitting: Cardiology

## 2018-11-26 ENCOUNTER — Other Ambulatory Visit: Payer: Self-pay

## 2018-11-26 ENCOUNTER — Encounter: Payer: Self-pay | Admitting: Cardiology

## 2018-11-26 VITALS — BP 108/66 | HR 81 | Ht 64.0 in | Wt 164.0 lb

## 2018-11-26 DIAGNOSIS — Z720 Tobacco use: Secondary | ICD-10-CM | POA: Diagnosis not present

## 2018-11-26 DIAGNOSIS — E785 Hyperlipidemia, unspecified: Secondary | ICD-10-CM | POA: Diagnosis not present

## 2018-11-26 DIAGNOSIS — E1010 Type 1 diabetes mellitus with ketoacidosis without coma: Secondary | ICD-10-CM | POA: Diagnosis not present

## 2018-11-26 DIAGNOSIS — I2510 Atherosclerotic heart disease of native coronary artery without angina pectoris: Secondary | ICD-10-CM | POA: Diagnosis not present

## 2018-11-26 MED ORDER — RANOLAZINE ER 500 MG PO TB12: 500.0000 mg | ORAL_TABLET | Freq: Two times a day (BID) | ORAL | 1 refills | Status: DC

## 2018-11-26 MED ORDER — ISOSORBIDE MONONITRATE ER 30 MG PO TB24: 30.0000 mg | ORAL_TABLET | Freq: Every day | ORAL | 0 refills | Status: DC

## 2018-11-26 NOTE — Addendum Note (Signed)
Addended by: Ashok Norris on: 11/26/2018 02:28 PM   Modules accepted: Orders

## 2018-11-26 NOTE — Patient Instructions (Signed)
Medication Instructions:  Your physician has recommended you make the following change in your medication:   START: Ranexa  500 mg twice daily   If you need a refill on your cardiac medications before your next appointment, please call your pharmacy.   Lab work: Your physician recommends that you return for lab work in 1 week FASTING: Lipids, and A1c  If you have labs (blood work) drawn today and your tests are completely normal, you will receive your results only by: Marland Kitchen MyChart Message (if you have MyChart) OR . A paper copy in the mail If you have any lab test that is abnormal or we need to change your treatment, we will call you to review the results.  Testing/Procedures: None.   Follow-Up: At [facility], you and your health needs are our priority.  As part of our continuing mission to provide you with exceptional heart care, we have created designated Provider Care Teams.  These Care Teams include your primary Cardiologist (physician) and Advanced Practice Providers (APPs -  Physician Assistants and Nurse Practitioners) who all work together to provide you with the care you need, when you need it. You will need a follow up appointment in 3 weeks.  Please call our office 2 months in advance to schedule this appointment.  You may see Jenne Campus, MD or another member of our [facility] Provider Team in [place]: Shirlee More, MD . Jyl Heinz, MD  Any Other Special Instructions Will Be Listed Below (If Applicable).  Ranolazine tablets, extended release What is this medicine? RANOLAZINE (ra NOE la zeen) is a heart medicine. It is used to treat chronic chest pain (angina). This medicine must be taken regularly. It will not relieve an acute episode of chest pain. This medicine may be used for other purposes; ask your health care provider or pharmacist if you have questions. COMMON BRAND NAME(S): Ranexa What should I tell my health care provider before I take this  medicine? They need to know if you have any of these conditions:  heart disease  irregular heartbeat  kidney disease  liver disease  low levels of potassium or magnesium in the blood  an unusual or allergic reaction to ranolazine, other medicines, foods, dyes, or preservatives  pregnant or trying to get pregnant  breast-feeding How should I use this medicine? Take this medicine by mouth with a glass of water. Follow the directions on the prescription label. Do not cut, crush, or chew this medicine. Take with or without food. Do not take this medication with grapefruit juice. Take your doses at regular intervals. Do not take your medicine more often then directed. Talk to your pediatrician regarding the use of this medicine in children. Special care may be needed. Overdosage: If you think you have taken too much of this medicine contact a poison control center or emergency room at once. NOTE: This medicine is only for you. Do not share this medicine with others. What if I miss a dose? If you miss a dose, take it as soon as you can. If it is almost time for your next dose, take only that dose. Do not take double or extra doses. What may interact with this medicine? Do not take this medicine with any of the following medications:  antivirals for HIV or AIDS  cerivastatin  certain antibiotics like chloramphenicol, clarithromycin, dalfopristin; quinupristin, isoniazid, rifabutin, rifampin, rifapentine  certain medicines used for cancer like imatinib, nilotinib  certain medicines for fungal infections like fluconazole, itraconazole, ketoconazole,  posaconazole, voriconazole  certain medicines for irregular heart beat like dronedarone  certain medicines for seizures like carbamazepine, fosphenytoin, oxcarbazepine, phenobarbital, phenytoin  cisapride  conivaptan  cyclosporine  grapefruit or grapefruit juice  lumacaftor; ivacaftor  nefazodone  pimozide  quinacrine  St  John's wort  thioridazine This medicine may also interact with the following medications:  alfuzosin  certain medicines for depression, anxiety, or psychotic disturbances like bupropion, citalopram, fluoxetine, fluphenazine, paroxetine, perphenazine, risperidone, sertraline, trifluoperazine  certain medicines for cholesterol like atorvastatin, lovastatin, simvastatin  certain medicines for stomach problems like octreotide, palonosetron, prochlorperazine  eplerenone  ergot alkaloids like dihydroergotamine, ergonovine, ergotamine, methylergonovine  metformin  nicardipine  other medicines that prolong the QT interval (cause an abnormal heart rhythm) like dofetilide, ziprasidone  sirolimus  tacrolimus This list may not describe all possible interactions. Give your health care provider a list of all the medicines, herbs, non-prescription drugs, or dietary supplements you use. Also tell them if you smoke, drink alcohol, or use illegal drugs. Some items may interact with your medicine. What should I watch for while using this medicine? Visit your doctor for regular check ups. Tell your doctor or healthcare professional if your symptoms do not start to get better or if they get worse. This medicine will not relieve an acute attack of angina or chest pain. This medicine can change your heart rhythm. Your health care provider may check your heart rhythm by ordering an electrocardiogram (ECG) while you are taking this medicine. You may get drowsy or dizzy. Do not drive, use machinery, or do anything that needs mental alertness until you know how this medicine affects you. Do not stand or sit up quickly, especially if you are an older patient. This reduces the risk of dizzy or fainting spells. Alcohol may interfere with the effect of this medicine. Avoid alcoholic drinks. If you are scheduled for any medical or dental procedure, tell your healthcare provider that you are taking this medicine. This  medicine can interact with other medicines used during surgery. What side effects may I notice from receiving this medicine? Side effects that you should report to your doctor or health care professional as soon as possible:  allergic reactions like skin rash, itching or hives, swelling of the face, lips, or tongue  breathing problems  changes in vision  fast, irregular or pounding heartbeat  feeling faint or lightheaded, falls  low or high blood pressure  numbness or tingling feelings  ringing in the ears  tremor or shakiness  slow heartbeat (fewer than 50 beats per minute)  swelling of the legs or feet Side effects that usually do not require medical attention (report to your doctor or health care professional if they continue or are bothersome):  constipation  drowsy  dry mouth  headache  nausea or vomiting  stomach upset This list may not describe all possible side effects. Call your doctor for medical advice about side effects. You may report side effects to FDA at 1-800-FDA-1088. Where should I keep my medicine? Keep out of the reach of children. Store at room temperature between 15 and 30 degrees C (59 and 86 degrees F). Throw away any unused medicine after the expiration date. NOTE: This sheet is a summary. It may not cover all possible information. If you have questions about this medicine, talk to your doctor, pharmacist, or health care provider.  2020 Elsevier/Gold Standard (2018-02-03 09:18:49)

## 2018-11-26 NOTE — Progress Notes (Signed)
Cardiology Office Note:    Date:  11/26/2018   ID:  Melody Myers, DOB 04-Jul-1980, MRN [id number]  PCP:  Laurel Dimmer, FNP  Cardiologist:  Gypsy Balsam, MD    Referring MD: Selena Batten *   Chief Complaint  Patient presents with  . Follow-up  Doing fair  History of Present Illness:    Melody Myers is a 38 y.o. female with past medical history significant for coronary artery disease status post PTCA and stenting of obtuse marginal branch done about a month ago.  She also got some 50% blockages in different arteries.  She does have diabetes she is an ex-smoker comes today to my office for follow-up overall doing fair she described to have some exertional shortness of breath as well as sometimes chest tightness.  She ended going to the emergency room troponin was normal she was put on Imdur and she said she feels better but still does have some rare episode of chest pain.  We talked today at length about what to do with the situation her symptoms could be related to Brilinta but I am worried that there may be still some residual significant coronary disease.  We talked in length about to what to do with the situation she prefer medical therapy first.  Therefore, will refill prescription for Imdur, she does have nitroglycerin as needed, I will give her ranolazine 500 mg twice daily and see if that helps.  I am worried about potentially price issue if this is the case we will simply increase dose of Imdur to 60 mg daily.  I would like to see her back in my office very quickly within next 3 weeks and see how she does she may require some evaluation of her coronary artery disease again. Luckily she quit smoking She tells me that her diabetes is controlled.  However, I will check hemoglobin A1c today.  Past Medical History:  Diagnosis Date  . Anxiety   . Asthma   . Bipolar 1 disorder (HCC)   . Depression    patient reports bipolor  . Depression   . Diabetic neuropathy  (HCC)   . Insulin dependent diabetes mellitus with complications    insulin dependant  . Syncopal episodes     Past Surgical History:  Procedure Laterality Date  . CORONARY STENT INTERVENTION N/A 10/02/2018   Procedure: CORONARY STENT INTERVENTION;  Surgeon: Yvonne Kendall, MD;  Location: [facility];  Service: Cardiovascular;  Laterality: N/A;  . LEFT HEART CATH AND CORONARY ANGIOGRAPHY N/A 10/02/2018   Procedure: LEFT HEART CATH AND CORONARY ANGIOGRAPHY;  Surgeon: Yvonne Kendall, MD;  Location: [facility];  Service: Cardiovascular;  Laterality: N/A;  . TUBAL LIGATION    . VAGINA RECONSTRUCTION SURGERY  10/2014   & ana, reconstruction    Current Medications: Current Meds  Medication Sig  . acetaminophen (TYLENOL) 325 MG tablet Take 650 mg by mouth every 6 (six) hours as needed for moderate pain or headache.   . ALPRAZolam (XANAX) 1 MG tablet Take 1 mg by mouth 2 (two) times daily.   Marland Kitchen aspirin EC 81 MG EC tablet Take 1 tablet (81 mg total) by mouth daily.  Marland Kitchen atorvastatin (LIPITOR) 80 MG tablet Take 1 tablet (80 mg total) by mouth daily at 6 PM.  . buPROPion (WELLBUTRIN XL) 150 MG 24 hr tablet Take 150 mg by mouth daily.   . cariprazine (VRAYLAR) capsule Take 3 mg by mouth daily.  . clotrimazole (LOTRIMIN) 1 % cream  Apply 1 application topically 2 (two) times daily as needed. Rash  . doxycycline (VIBRAMYCIN) 100 MG capsule Take 100 mg by mouth as needed (spot on her Groin).   . Fluticasone-Salmeterol (ADVAIR) 250-50 MCG/DOSE AEPB Inhale 1 puff into the lungs 2 (two) times daily.  Marland Kitchen GLUCAGON EMERGENCY 1 MG injection Inject 1 mg as directed once.  . insulin aspart (NOVOLOG) 100 UNIT/ML injection Inject 10-15 Units into the skin 4 (four) times daily. Sliding scale  . Insulin Glargine (BASAGLAR KWIKPEN) 100 UNIT/ML SOPN Inject 32 Units into the skin at bedtime.  Marland Kitchen ipratropium-albuterol (DUONEB) 0.5-2.5 (3) MG/3ML SOLN Take 3 mLs by nebulization 2 (two) times daily.   .  isosorbide mononitrate (IMDUR) 30 MG 24 hr tablet Take 1 tablet (30 mg total) by mouth daily.  Marland Kitchen loperamide (IMODIUM) 2 MG capsule Take 2 mg by mouth as needed for diarrhea or loose stools.  . metoprolol tartrate (LOPRESSOR) 25 MG tablet Take 1 tablet (25 mg total) by mouth 2 (two) times daily.  . montelukast (SINGULAIR) 10 MG tablet Take 10 mg by mouth at bedtime.  . Multiple Vitamin (MULTIVITAMIN WITH MINERALS) TABS Take 1 tablet by mouth daily.  . nitroGLYCERIN (NITROSTAT) 0.4 MG SL tablet Place 1 tablet (0.4 mg total) under the tongue every 5 (five) minutes x 3 doses as needed for chest pain.  Marland Kitchen omeprazole (PRILOSEC) 40 MG capsule Take 40 mg by mouth daily.  . ondansetron (ZOFRAN ODT) 4 MG disintegrating tablet Take 1 tablet (4 mg total) by mouth every 8 (eight) hours as needed for nausea.  . ticagrelor (BRILINTA) 90 MG TABS tablet Take 1 tablet (90 mg total) by mouth 2 (two) times daily.  . VENTOLIN HFA 108 (90 Base) MCG/ACT inhaler Inhale 2 puffs into the lungs every 4 (four) hours as needed.     Allergies:   Amoxicillin, Latex, Penicillins, and Prednisone   Social History   Socioeconomic History  . Marital status: Single    Spouse name: Not on file  . Number of children: Not on file  . Years of education: Not on file  . Highest education level: Not on file  Occupational History  . Not on file  Social Needs  . Financial resource strain: Hard  . Food insecurity    Worry: Often true    Inability: Sometimes true  . Transportation needs    Medical: Yes    Non-medical: Yes  Tobacco Use  . Smoking status: Former Smoker    Packs/day: 0.25    Types: Cigarettes  . Smokeless tobacco: Never Used  Substance and Sexual Activity  . Alcohol use: Yes    Comment: occasional  . Drug use: No  . Sexual activity: Yes    Comment: tubal ligation  Lifestyle  . Physical activity    Days per week: 1 day    Minutes per session: 30 min  . Stress: Very much  Relationships  . Social  connections    Talks on phone: More than three times a week    Gets together: More than three times a week    Attends religious service: Never    Active member of club or organization: No    Attends meetings of clubs or organizations: Never    Relationship status: Widowed  Other Topics Concern  . Not on file  Social History Narrative  . Not on file     Family History: The patient's family history includes Heart attack (age of onset: 28) in her brother; Heart attack (  age of onset: 49) in her father. ROS:   Please see the history of present illness.    All 14 point review of systems negative except as described per history of present illness  EKGs/Labs/Other Studies Reviewed:      Recent Labs: 10/31/2018: ALT 25; B Natriuretic Peptide 66.4; BUN 11; Creatinine, Ser 0.78; Hemoglobin 13.8; Platelets 303; Potassium 4.1; Sodium 137  Recent Lipid Panel No results found for: CHOL, TRIG, HDL, CHOLHDL, VLDL, LDLCALC, LDLDIRECT  Physical Exam:    VS:  BP 108/66   Pulse 81   Ht 5\' 4"  (1.626 m)   Wt 164 lb (74.4 kg)   SpO2 99%   BMI 28.15 kg/m     Wt Readings from Last 3 Encounters:  11/26/18 164 lb (74.4 kg)  10/31/18 155 lb (70.3 kg)  10/14/18 155 lb (70.3 kg)     GEN:  Well nourished, well developed in no acute distress HEENT: Normal NECK: No JVD; No carotid bruits LYMPHATICS: No lymphadenopathy CARDIAC: RRR, no murmurs, no rubs, no gallops RESPIRATORY:  Clear to auscultation without rales, wheezing or rhonchi  ABDOMEN: Soft, non-tender, non-distended MUSCULOSKELETAL:  No edema; No deformity  SKIN: Warm and dry LOWER EXTREMITIES: no swelling NEUROLOGIC:  Alert and oriented x 3 PSYCHIATRIC:  Normal affect   ASSESSMENT:    1. Coronary artery disease involving native coronary artery of native heart without angina pectoris   2. Hyperlipidemia LDL goal <70   3. Tobacco abuse    PLAN:    In order of problems listed above:  1. Coronary disease plan as outlined above we  will try ranolazine 500 twice daily and a quick evaluation about 3 weeks to see how she does.  She was told if pain is not relieved by nitroglycerin she needs to go to the emergency room. 2. Hyperlipidemia continue with high intensity statin she is on Lipitor 80 fasting lipid profile will be checked in the next few days 3. Tobacco abuse, likely she quit smoking.  I encouraged her to stay away from it 4. Diabetes apparently well controlled according to her but will check hemoglobin A1c.   Medication Adjustments/Labs and Tests Ordered: Current medicines are reviewed at length with the patient today.  Concerns regarding medicines are outlined above.  No orders of the defined types were placed in this encounter.  Medication changes: No orders of the defined types were placed in this encounter.   Signed, Park Liter, MD, [facility] 11/26/2018 2:16 PM    [place]

## 2018-11-27 ENCOUNTER — Ambulatory Visit: Payer: Medicare Other | Admitting: Cardiology

## 2018-12-06 DIAGNOSIS — Z720 Tobacco use: Secondary | ICD-10-CM

## 2018-12-06 DIAGNOSIS — I249 Acute ischemic heart disease, unspecified: Secondary | ICD-10-CM

## 2018-12-06 DIAGNOSIS — J449 Chronic obstructive pulmonary disease, unspecified: Secondary | ICD-10-CM

## 2018-12-06 DIAGNOSIS — E109 Type 1 diabetes mellitus without complications: Secondary | ICD-10-CM

## 2018-12-06 DIAGNOSIS — R079 Chest pain, unspecified: Secondary | ICD-10-CM

## 2018-12-06 DIAGNOSIS — I214 Non-ST elevation (NSTEMI) myocardial infarction: Secondary | ICD-10-CM

## 2018-12-07 DIAGNOSIS — R079 Chest pain, unspecified: Secondary | ICD-10-CM | POA: Diagnosis not present

## 2018-12-07 DIAGNOSIS — E109 Type 1 diabetes mellitus without complications: Secondary | ICD-10-CM | POA: Diagnosis not present

## 2018-12-07 DIAGNOSIS — I249 Acute ischemic heart disease, unspecified: Secondary | ICD-10-CM | POA: Diagnosis not present

## 2018-12-07 DIAGNOSIS — J449 Chronic obstructive pulmonary disease, unspecified: Secondary | ICD-10-CM | POA: Diagnosis not present

## 2018-12-08 DIAGNOSIS — I209 Angina pectoris, unspecified: Secondary | ICD-10-CM

## 2018-12-08 DIAGNOSIS — E119 Type 2 diabetes mellitus without complications: Secondary | ICD-10-CM

## 2018-12-08 DIAGNOSIS — Z720 Tobacco use: Secondary | ICD-10-CM

## 2018-12-08 DIAGNOSIS — E109 Type 1 diabetes mellitus without complications: Secondary | ICD-10-CM | POA: Diagnosis not present

## 2018-12-08 DIAGNOSIS — J449 Chronic obstructive pulmonary disease, unspecified: Secondary | ICD-10-CM | POA: Diagnosis not present

## 2018-12-08 DIAGNOSIS — R079 Chest pain, unspecified: Secondary | ICD-10-CM

## 2018-12-08 DIAGNOSIS — I249 Acute ischemic heart disease, unspecified: Secondary | ICD-10-CM | POA: Diagnosis not present

## 2018-12-13 ENCOUNTER — Emergency Department (HOSPITAL_COMMUNITY): Payer: Medicare Other

## 2018-12-13 ENCOUNTER — Other Ambulatory Visit: Payer: Self-pay

## 2018-12-13 ENCOUNTER — Emergency Department (HOSPITAL_COMMUNITY)
Admission: EM | Admit: 2018-12-13 | Discharge: 2018-12-13 | Disposition: A | Discharge status: Home / Self Care | Payer: Medicare Other | Attending: Emergency Medicine | Admitting: Emergency Medicine

## 2018-12-13 ENCOUNTER — Encounter (HOSPITAL_COMMUNITY): Payer: Self-pay | Admitting: Pharmacy Technician

## 2018-12-13 DIAGNOSIS — E1140 Type 2 diabetes mellitus with diabetic neuropathy, unspecified: Secondary | ICD-10-CM | POA: Insufficient documentation

## 2018-12-13 DIAGNOSIS — Z79899 Other long term (current) drug therapy: Secondary | ICD-10-CM | POA: Diagnosis not present

## 2018-12-13 DIAGNOSIS — Z7982 Long term (current) use of aspirin: Secondary | ICD-10-CM | POA: Insufficient documentation

## 2018-12-13 DIAGNOSIS — Z87891 Personal history of nicotine dependence: Secondary | ICD-10-CM | POA: Diagnosis not present

## 2018-12-13 DIAGNOSIS — I2510 Atherosclerotic heart disease of native coronary artery without angina pectoris: Secondary | ICD-10-CM | POA: Diagnosis not present

## 2018-12-13 DIAGNOSIS — E119 Type 2 diabetes mellitus without complications: Secondary | ICD-10-CM | POA: Diagnosis not present

## 2018-12-13 DIAGNOSIS — I252 Old myocardial infarction: Secondary | ICD-10-CM | POA: Diagnosis not present

## 2018-12-13 DIAGNOSIS — Z91040 Latex allergy status: Secondary | ICD-10-CM | POA: Insufficient documentation

## 2018-12-13 DIAGNOSIS — R079 Chest pain, unspecified: Secondary | ICD-10-CM | POA: Insufficient documentation

## 2018-12-13 DIAGNOSIS — Z20828 Contact with and (suspected) exposure to other viral communicable diseases: Secondary | ICD-10-CM | POA: Diagnosis not present

## 2018-12-13 DIAGNOSIS — Z794 Long term (current) use of insulin: Secondary | ICD-10-CM | POA: Insufficient documentation

## 2018-12-13 DIAGNOSIS — Z955 Presence of coronary angioplasty implant and graft: Secondary | ICD-10-CM | POA: Insufficient documentation

## 2018-12-13 LAB — SARS CORONAVIRUS 2 BY RT PCR (HOSPITAL ORDER, PERFORMED IN ~~LOC~~ HOSPITAL LAB): SARS Coronavirus 2: NEGATIVE

## 2018-12-13 LAB — CBC WITH DIFFERENTIAL/PLATELET
Abs Immature Granulocytes: 0.06 10*3/uL (ref 0.00–0.07)
Basophils Absolute: 0.1 10*3/uL (ref 0.0–0.1)
Basophils Relative: 1 %
Eosinophils Absolute: 0.1 10*3/uL (ref 0.0–0.5)
Eosinophils Relative: 1 %
HCT: 39.5 % (ref 36.0–46.0)
Hemoglobin: 13.2 g/dL (ref 12.0–15.0)
Immature Granulocytes: 1 %
Lymphocytes Relative: 25 %
Lymphs Abs: 2.3 10*3/uL (ref 0.7–4.0)
MCH: 30.4 pg (ref 26.0–34.0)
MCHC: 33.4 g/dL (ref 30.0–36.0)
MCV: 91 fL (ref 80.0–100.0)
Monocytes Absolute: 0.7 10*3/uL (ref 0.1–1.0)
Monocytes Relative: 7 %
Neutro Abs: 6 10*3/uL (ref 1.7–7.7)
Neutrophils Relative %: 65 %
Platelets: 242 10*3/uL (ref 150–400)
RBC: 4.34 MIL/uL (ref 3.87–5.11)
RDW: 12.9 % (ref 11.5–15.5)
WBC: 9.2 10*3/uL (ref 4.0–10.5)
nRBC: 0 % (ref 0.0–0.2)

## 2018-12-13 LAB — COMPREHENSIVE METABOLIC PANEL
ALT: 19 U/L (ref 0–44)
AST: 20 U/L (ref 15–41)
Albumin: 3.4 g/dL — ABNORMAL LOW (ref 3.5–5.0)
Alkaline Phosphatase: 70 U/L (ref 38–126)
Anion gap: 11 (ref 5–15)
BUN: 8 mg/dL (ref 6–20)
CO2: 21 mmol/L — ABNORMAL LOW (ref 22–32)
Calcium: 8.4 mg/dL — ABNORMAL LOW (ref 8.9–10.3)
Chloride: 104 mmol/L (ref 98–111)
Creatinine, Ser: 0.7 mg/dL (ref 0.44–1.00)
GFR calc Af Amer: >60 mL/min (ref >60)
GFR calc non Af Amer: >60 mL/min (ref >60)
Glucose, Bld: 375 mg/dL — ABNORMAL HIGH (ref 70–99)
Potassium: 4 mmol/L (ref 3.5–5.1)
Sodium: 136 mmol/L (ref 135–145)
Total Bilirubin: 0.5 mg/dL (ref 0.3–1.2)
Total Protein: 6.1 g/dL — ABNORMAL LOW (ref 6.5–8.1)

## 2018-12-13 LAB — LIPASE, BLOOD: Lipase: 28 U/L (ref 11–51)

## 2018-12-13 LAB — I-STAT BETA HCG BLOOD, ED (MC, WL, AP ONLY): I-stat hCG, quantitative: <5 m[IU]/mL (ref <5)

## 2018-12-13 LAB — CBG MONITORING, ED: Glucose-Capillary: 365 mg/dL — ABNORMAL HIGH (ref 70–99)

## 2018-12-13 LAB — TROPONIN I (HIGH SENSITIVITY)
Troponin I (High Sensitivity): 5 ng/L (ref <18)
Troponin I (High Sensitivity): <2 ng/L (ref <18)

## 2018-12-13 MED ORDER — ISOSORBIDE MONONITRATE ER 30 MG PO TB24: 60.0000 mg | ORAL_TABLET | Freq: Every day | ORAL | 0 refills | Status: DC

## 2018-12-13 NOTE — ED Triage Notes (Signed)
Patient also reports several black stools x3 days. Takes brilinta for recent stent placement.

## 2018-12-13 NOTE — ED Triage Notes (Signed)
Pt arrives ems from home with CP onset 3 hours PTA. Hx MI with stent placement 2 months ago. EKG ST. 324mg  ASA and 1 SL NTG given en route. 20gLAC CBG 586 BP 132/86  After 1 SL NTG HR 100 98% RA Temp 98.44F oral

## 2018-12-13 NOTE — ED Notes (Signed)
Patient verbalizes understanding of discharge instructions. Opportunity for questioning and answers were provided. Reinforced patient to follow-up with her cardiologist as educated patient on new prescription. Patient discharged from ED, ambulatory by self.

## 2018-12-13 NOTE — ED Notes (Signed)
Pt also c/o black diarrhea onset 2 days ago.

## 2018-12-13 NOTE — ED Provider Notes (Signed)
[place] EMERGENCY DEPARTMENT Provider Note   CSN: [id number] Arrival date & time: 12/13/18  1755     History   Chief Complaint No chief complaint on file.   HPI Melody Myers is a 38 y.o. female.     HPI Patient with history of diabetes and CAD.  Had 2 cardiac stents placed in August.  States she has been having chest pain since that time.  Was admitted to [facility] for left-sided chest pain earlier this week.  States she had a stress test and was told she needed a cardiac catheterization.  Patient left AMA 5 days ago.  Says she has been having left-sided chest pain constantly since that time but it worst several hours prior to coming into the emergency department.  Called EMS and was given aspirin and 1 nitroglycerin in route.  Also noted to be hyperglycemic.  Patient also states she is to had diarrhea for the past 3 days and has started taking Pepto-Bismol.  She then noticed that the stool turned black.  Past Medical History:  Diagnosis Date  . Anxiety   . Asthma   . Bipolar 1 disorder ([place])   . Depression    patient reports bipolor  . Depression   . Diabetic neuropathy ([place])   . Insulin dependent diabetes mellitus with complications    insulin dependant  . Syncopal episodes     Patient Active Problem List   Diagnosis Date Noted  . 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 10/14/2018  . Hyperlipidemia LDL goal <70 10/04/2018  . Non-ST elevation (NSTEMI) myocardial infarction ([place]) 10/02/2018  . NSTEMI (non-ST elevated myocardial infarction) ([place]) 10/02/2018  . Hypokalemia 11/15/2014  . Urinary retention 11/14/2014  . Superficial incisional surgical site infection 11/14/2014  . Leukocytosis 03/20/2013  . Bipolar depression ([place]) 03/20/2013  . Chronic pain 03/20/2013  . Tobacco abuse 03/20/2013  . Diabetic neuropathy ([place])   . Insulin dependent diabetes mellitus with complications ([place])    . DKA (diabetic ketoacidoses) ([place]) 03/19/2013  . Asthma   . Anxiety   . Depression   . Neuropathy     Past Surgical History:  Procedure Laterality Date  . CORONARY STENT INTERVENTION N/A 10/02/2018   Procedure: CORONARY STENT INTERVENTION;  Surgeon: Nelva Bush, MD;  Location: [place] CV LAB;  Service: Cardiovascular;  Laterality: N/A;  . LEFT HEART CATH AND CORONARY ANGIOGRAPHY N/A 10/02/2018   Procedure: LEFT HEART CATH AND CORONARY ANGIOGRAPHY;  Surgeon: Nelva Bush, MD;  Location: [place] CV LAB;  Service: Cardiovascular;  Laterality: N/A;  . TUBAL LIGATION    . VAGINA RECONSTRUCTION SURGERY  10/2014   & ana, reconstruction     OB History   No obstetric history on file.      Home Medications    Prior to Admission medications   Medication Sig Start Date End Date Taking? Authorizing Provider  acetaminophen (TYLENOL) 325 MG tablet Take 650 mg by mouth every 6 (six) hours as needed for moderate pain or headache.     [provider]  ALPRAZolam Duanne Moron) 1 MG tablet Take 1 mg by mouth 2 (two) times daily.     [provider]  aspirin EC 81 MG EC tablet Take 1 tablet (81 mg total) by mouth daily. 10/05/18   Almyra Deforest, PA  atorvastatin (LIPITOR) 80 MG tablet Take 1 tablet (80 mg total) by mouth daily at 6 PM. 10/04/18   Almyra Deforest, PA  buPROPion (WELLBUTRIN XL)  150 MG 24 hr tablet Take 150 mg by mouth daily.     [provider]  cariprazine (VRAYLAR) capsule Take 3 mg by mouth daily.    [provider]  clotrimazole (LOTRIMIN) 1 % cream Apply 1 application topically 2 (two) times daily as needed. Rash 09/16/18   [provider]  doxycycline (VIBRAMYCIN) 100 MG capsule Take 100 mg by mouth as needed (spot on her Groin).  09/16/18   [provider]  Fluticasone-Salmeterol (ADVAIR) 250-50 MCG/DOSE AEPB Inhale 1 puff into the lungs 2 (two) times daily. 04/10/18   [provider]  GLUCAGON EMERGENCY 1 MG injection  Inject 1 mg as directed once. 05/28/18   [provider]  insulin aspart (NOVOLOG) 100 UNIT/ML injection Inject 10-15 Units into the skin 4 (four) times daily. Sliding scale    [provider]  Insulin Glargine (BASAGLAR KWIKPEN) 100 UNIT/ML SOPN Inject 32 Units into the skin at bedtime.    [provider]  ipratropium-albuterol (DUONEB) 0.5-2.5 (3) MG/3ML SOLN Take 3 mLs by nebulization 2 (two) times daily.     [provider]  isosorbide mononitrate (IMDUR) 30 MG 24 hr tablet Take 2 tablets (60 mg total) by mouth daily. 12/13/18   Loren RacerYelverton, Param Capri, MD  loperamide (IMODIUM) 2 MG capsule Take 2 mg by mouth as needed for diarrhea or loose stools.    [provider]  metoprolol tartrate (LOPRESSOR) 25 MG tablet Take 1 tablet (25 mg total) by mouth 2 (two) times daily. 10/04/18   Azalee CourseMeng, Hao, PA  montelukast (SINGULAIR) 10 MG tablet Take 10 mg by mouth at bedtime.    [provider]  Multiple Vitamin (MULTIVITAMIN WITH MINERALS) TABS Take 1 tablet by mouth daily.    [provider]  nitroGLYCERIN (NITROSTAT) 0.4 MG SL tablet Place 1 tablet (0.4 mg total) under the tongue every 5 (five) minutes x 3 doses as needed for chest pain. 10/04/18   Azalee CourseMeng, Hao, PA  omeprazole (PRILOSEC) 40 MG capsule Take 40 mg by mouth daily. 08/19/18   [provider]  ondansetron (ZOFRAN ODT) 4 MG disintegrating tablet Take 1 tablet (4 mg total) by mouth every 8 (eight) hours as needed for nausea. 09/16/14   Eber HongMiller, Brian, MD  ranolazine (RANEXA) 500 MG 12 hr tablet Take 1 tablet (500 mg total) by mouth 2 (two) times daily. 11/26/18   Georgeanna LeaKrasowski, Robert J, MD  ticagrelor (BRILINTA) 90 MG TABS tablet Take 1 tablet (90 mg total) by mouth 2 (two) times daily. 10/04/18   Azalee CourseMeng, Hao, PA  VENTOLIN HFA 108 (90 Base) MCG/ACT inhaler Inhale 2 puffs into the lungs every 4 (four) hours as needed. 04/07/18   [provider]    Family History Family History  Problem  Relation Age of Onset  . Heart attack Father 3557  . Heart attack Brother 2629    Social History Social History   Tobacco Use  . Smoking status: Former Smoker    Packs/day: 0.25    Types: Cigarettes  . Smokeless tobacco: Never Used  Substance Use Topics  . Alcohol use: Yes    Comment: occasional  . Drug use: No     Allergies   Amoxicillin, Latex, Penicillins, and Prednisone   Review of Systems Review of Systems  Constitutional: Negative for chills and fever.  HENT: Negative for sore throat and trouble swallowing.   Eyes: Negative for visual disturbance.  Respiratory: Negative for cough and shortness of breath.   Cardiovascular: Positive for chest pain.  Negative for palpitations and leg swelling.  Gastrointestinal: Positive for diarrhea. Negative for abdominal pain, nausea and vomiting.  Genitourinary: Negative for dysuria, flank pain and frequency.  Musculoskeletal: Negative for back pain, myalgias and neck pain.  Skin: Negative for rash and wound.  Neurological: Negative for dizziness, weakness, light-headedness, numbness and headaches.  All other systems reviewed and are negative.    Physical Exam Updated Vital Signs BP 100/62   Pulse 81   Temp 98.1 F (36.7 C) (Oral)   Resp (!) 23   Ht 5\' 4"  (1.626 m)   Wt 72.6 kg   SpO2 100%   BMI 27.46 kg/m   Physical Exam Vitals signs and nursing note reviewed.  Constitutional:      Appearance: Normal appearance. She is well-developed.  HENT:     Head: Normocephalic and atraumatic.     Nose: Nose normal.     Mouth/Throat:     Mouth: Mucous membranes are moist.  Eyes:     Pupils: Pupils are equal, round, and reactive to light.  Neck:     Musculoskeletal: Normal range of motion and neck supple.  Cardiovascular:     Rate and Rhythm: Normal rate and regular rhythm.     Heart sounds: No murmur. No friction rub. No gallop.   Pulmonary:     Effort: Pulmonary effort is normal. No respiratory distress.     Breath  sounds: Normal breath sounds. No stridor. No wheezing, rhonchi or rales.  Chest:     Chest wall: No tenderness.  Abdominal:     General: Bowel sounds are normal. There is no distension.     Palpations: Abdomen is soft. There is no mass.     Tenderness: There is no abdominal tenderness. There is no right CVA tenderness, left CVA tenderness, guarding or rebound.     Hernia: No hernia is present.  Musculoskeletal: Normal range of motion.        General: No swelling, tenderness, deformity or signs of injury.     Right lower leg: No edema.     Left lower leg: No edema.  Skin:    General: Skin is warm and dry.     Findings: No erythema or rash.  Neurological:     General: No focal deficit present.     Mental Status: She is alert and oriented to person, place, and time.  Psychiatric:        Mood and Affect: Mood normal.        Behavior: Behavior normal.      ED Treatments / Results  Labs (all labs ordered are listed, but only abnormal results are displayed) Labs Reviewed  COMPREHENSIVE METABOLIC PANEL - Abnormal; Notable for the following components:      Result Value   CO2 21 (*)    Glucose, Bld 375 (*)    Calcium 8.4 (*)    Total Protein 6.1 (*)    Albumin 3.4 (*)    All other components within normal limits  CBG MONITORING, ED - Abnormal; Notable for the following components:   Glucose-Capillary 365 (*)    All other components within normal limits  SARS CORONAVIRUS 2 BY RT PCR (HOSPITAL ORDER, PERFORMED IN [facility])  CBC WITH DIFFERENTIAL/PLATELET  LIPASE, BLOOD  I-STAT BETA HCG BLOOD, ED ([facility], [facility], [facility] ONLY)  TROPONIN I (HIGH SENSITIVITY)  TROPONIN I (HIGH SENSITIVITY)    EKG EKG Interpretation  Date/Time:  Sunday December 13 2018 18:25:46 EDT Ventricular Rate:  92 PR  Interval:    QRS Duration: 94 QT Interval:  366 QTC Calculation: 453 R Axis:   -53 Text Interpretation:  Sinus rhythm Left axis deviation RSR' in V1 or V2, right VCD or RVH Confirmed by  Loren Racer [id number]) on 12/13/2018 7:06:06 PM   Radiology Dg Chest Port 1 View  Result Date: 12/13/2018 CLINICAL DATA:  Initial evaluation for acute chest pain. EXAM: PORTABLE CHEST 1 VIEW COMPARISON:  Prior radiograph from 10/31/2018. FINDINGS: Cardiac and mediastinal silhouettes are stable in size and contour, and remain within normal limits. Lungs normally inflated. Mild diffuse interstitial prominence with peribronchial thickening, which could reflect mild interstitial congestion or possibly atypical pneumonitis. No frank airspace consolidation to suggest pneumonia. No pleural effusion. No pneumothorax. No acute osseous finding. IMPRESSION: Mild diffuse interstitial prominence with peribronchial thickening, which could reflect sequelae of mild interstitial congestion or possibly atypical pneumonitis. No frank pulmonary edema or airspace consolidation to suggest pneumonia. Electronically Signed   By: Rise Mu M.D.   On: 12/13/2018 19:40    Procedures Procedures (including critical care time)  Medications Ordered in ED Medications - No data to display   Initial Impression / Assessment and Plan / ED Course  I have reviewed the triage vital signs and the nursing notes.  Pertinent labs & imaging results that were available during my care of the patient were reviewed by me and considered in my medical decision making (see chart for details).        Patient is in no acute distress.  Well-appearing.  EKG without acute changes.  Troponin x2 is normal.  Discussed with cardiology, Dr. Daphine Deutscher.  Recommends increasing Imdur to 60 mg a day.  Patient has follow-up appointment in 2 days with her cardiologist.  She will follow-up then.  Understands the need to return immediately for any worsening of her symptoms.  Advised to take Imodium as needed for diarrhea.  Hemoglobin is stable.  Low suspicion for GI bleed.  Dark color stool likely due to Pepto-Bismol. Final Clinical Impressions(s)  / ED Diagnoses   Final diagnoses:  Chest pain, unspecified type    ED Discharge Orders         Ordered    isosorbide mononitrate (IMDUR) 30 MG 24 hr tablet  Daily     12/13/18 2228           Loren Racer, MD 12/13/18 2233

## 2018-12-15 ENCOUNTER — Encounter: Payer: Self-pay | Admitting: Cardiology

## 2018-12-15 ENCOUNTER — Ambulatory Visit (INDEPENDENT_AMBULATORY_CARE_PROVIDER_SITE_OTHER): Payer: Medicare Other | Admitting: Cardiology

## 2018-12-15 ENCOUNTER — Other Ambulatory Visit: Payer: Self-pay

## 2018-12-15 VITALS — BP 100/62 | HR 95 | Ht 64.0 in | Wt 160.8 lb

## 2018-12-15 DIAGNOSIS — E785 Hyperlipidemia, unspecified: Secondary | ICD-10-CM

## 2018-12-15 DIAGNOSIS — I2510 Atherosclerotic heart disease of native coronary artery without angina pectoris: Secondary | ICD-10-CM | POA: Diagnosis not present

## 2018-12-15 DIAGNOSIS — G8929 Other chronic pain: Secondary | ICD-10-CM

## 2018-12-15 DIAGNOSIS — R1011 Right upper quadrant pain: Secondary | ICD-10-CM

## 2018-12-15 DIAGNOSIS — F419 Anxiety disorder, unspecified: Secondary | ICD-10-CM | POA: Diagnosis not present

## 2018-12-15 MED ORDER — ESOMEPRAZOLE MAGNESIUM 40 MG PO CPDR: 40.0000 mg | DELAYED_RELEASE_CAPSULE | Freq: Every day | ORAL | 1 refills | Status: DC

## 2018-12-15 NOTE — Patient Instructions (Signed)
Medication Instructions:  Your physician has recommended you make the following change in your medication:   Start: Nexium 40 mg daily   *If you need a refill on your cardiac medications before your next appointment, please call your pharmacy*  Lab Work: None.  If you have labs (blood work) drawn today and your tests are completely normal, you will receive your results only by: Marland Kitchen MyChart Message (if you have MyChart) OR . A paper copy in the mail If you have any lab test that is abnormal or we need to change your treatment, we will call you to review the results.  Testing/Procedures: You will have a gallbladder ultrasound.   Follow-Up: At [facility], you and your health needs are our priority.  As part of our continuing mission to provide you with exceptional heart care, we have created designated Provider Care Teams.  These Care Teams include your primary Cardiologist (physician) and Advanced Practice Providers (APPs -  Physician Assistants and Nurse Practitioners) who all work together to provide you with the care you need, when you need it.  Your next appointment:   3 weeks  The format for your next appointment:   In Person  Provider:   Jenne Campus, MD  Other Instructions

## 2018-12-15 NOTE — Progress Notes (Signed)
Cardiology Office Note:    Date:  12/15/2018   ID:  Melody Myers, DOB 1980-06-04, MRN [id number]  PCP:  Laurel Dimmer, FNP  Cardiologist:  Gypsy Balsam, MD    Referring MD: Selena Batten *   Chief Complaint  Patient presents with   3 week follow up   Hospitalization Follow-up    History of Present Illness:    Melody Myers is a 38 y.o. female with past medical history significant for coronary artery disease status post PTCA and stenting done in August of this year of obtuse s marginal branch.  She has been having some recurrent chest pain.  Since have seen her last time she ended up going to round of hospital where she got stress test done showing small area of ischemia involving anteroseptal region, she was scheduled to have a cardiac catheterization however she signed AMA, then she came to the emergency room few days ago complaining of having some atypical chest pain every single time she rule out for myocardial infarction.  Finally she showed up in my office today.  She described to have chest pain which is sometimes related to exercise interestingly she used to take Nexium however stop it now she described a lot of heartburn for which she takes Pepto-Bismol.  On the physical examination she is tender in epigastrium on top of that she does have some tenderness in the right upper quadrant.  We had a long discussion about what to do with the situation and have told her the best way to proceed in this is post is due cardiac catheterization.  She wants to do it however first she preferred to try Nexium for few days.  I also will schedule her to have gallbladder ultrasound.  We will contact her on Monday and ask her how she does if she still have some chest pain then will proceed with cardiac catheterization.  I tried to give her ranolazine, she was not able to tolerate it, she also got prescription for Imdur and ask you to start taking it.  She took nitroglycerin for her  pain with got some relief.  However she did have headache after nitroglycerin.  I told her when she takes Imdur and have headache she need to take Tylenol.  Past Medical History:  Diagnosis Date   Anxiety    Asthma    Bipolar 1 disorder (HCC)    Depression    patient reports bipolor   Depression    Diabetic neuropathy (HCC)    Insulin dependent diabetes mellitus with complications    insulin dependant   Syncopal episodes     Past Surgical History:  Procedure Laterality Date   CORONARY STENT INTERVENTION N/A 10/02/2018   Procedure: CORONARY STENT INTERVENTION;  Surgeon: Yvonne Kendall, MD;  [facility];  Service: Cardiovascular;  Laterality: N/A;   LEFT HEART CATH AND CORONARY ANGIOGRAPHY N/A 10/02/2018   Procedure: LEFT HEART CATH AND CORONARY ANGIOGRAPHY;  Surgeon: Yvonne Kendall, MD;  Location: [facility];  Service: Cardiovascular;  Laterality: N/A;   TUBAL LIGATION     VAGINA RECONSTRUCTION SURGERY  10/2014   & ana, reconstruction    Current Medications: Current Meds  Medication Sig   acetaminophen (TYLENOL) 325 MG tablet Take 650 mg by mouth every 6 (six) hours as needed for moderate pain or headache.    ALPRAZolam (XANAX) 1 MG tablet Take 1 mg by mouth 2 (two) times daily.    aspirin EC 81 MG EC tablet  Take 1 tablet (81 mg total) by mouth daily.   atorvastatin (LIPITOR) 80 MG tablet Take 1 tablet (80 mg total) by mouth daily at 6 PM.   buPROPion (WELLBUTRIN XL) 150 MG 24 hr tablet Take 150 mg by mouth daily.    cariprazine (VRAYLAR) capsule Take 3 mg by mouth daily.   clotrimazole (LOTRIMIN) 1 % cream Apply 1 application topically 2 (two) times daily as needed. Rash   doxycycline (VIBRAMYCIN) 100 MG capsule Take 100 mg by mouth as needed (spot on her Groin).    Fluticasone-Salmeterol (ADVAIR) 250-50 MCG/DOSE AEPB Inhale 1 puff into the lungs 2 (two) times daily.   GLUCAGON EMERGENCY 1 MG injection Inject 1 mg as directed once.    insulin aspart (NOVOLOG) 100 UNIT/ML injection Inject 10-15 Units into the skin 4 (four) times daily. Sliding scale   Insulin Glargine (BASAGLAR KWIKPEN) 100 UNIT/ML SOPN Inject 32 Units into the skin at bedtime.   ipratropium-albuterol (DUONEB) 0.5-2.5 (3) MG/3ML SOLN Take 3 mLs by nebulization 2 (two) times daily.    loperamide (IMODIUM) 2 MG capsule Take 2 mg by mouth as needed for diarrhea or loose stools.   metoprolol tartrate (LOPRESSOR) 25 MG tablet Take 1 tablet (25 mg total) by mouth 2 (two) times daily.   montelukast (SINGULAIR) 10 MG tablet Take 10 mg by mouth at bedtime.   Multiple Vitamin (MULTIVITAMIN WITH MINERALS) TABS Take 1 tablet by mouth daily.   nitroGLYCERIN (NITROSTAT) 0.4 MG SL tablet Place 1 tablet (0.4 mg total) under the tongue every 5 (five) minutes x 3 doses as needed for chest pain.   omeprazole (PRILOSEC) 40 MG capsule Take 40 mg by mouth daily.   ondansetron (ZOFRAN ODT) 4 MG disintegrating tablet Take 1 tablet (4 mg total) by mouth every 8 (eight) hours as needed for nausea.   ticagrelor (BRILINTA) 90 MG TABS tablet Take 1 tablet (90 mg total) by mouth 2 (two) times daily.   VENTOLIN HFA 108 (90 Base) MCG/ACT inhaler Inhale 2 puffs into the lungs every 4 (four) hours as needed.     Allergies:   Amoxicillin, Latex, Penicillins, and Prednisone   Social History   Socioeconomic History   Marital status: Single    Spouse name: Not on file   Number of children: Not on file   Years of education: Not on file   Highest education level: Not on file  Occupational History   Not on file  Social Needs   Financial resource strain: Hard   Food insecurity    Worry: Often true    Inability: Sometimes true   Transportation needs    Medical: Yes    Non-medical: Yes  Tobacco Use   Smoking status: Former Smoker    Packs/day: 0.25    Types: Cigarettes   Smokeless tobacco: Never Used  Substance and Sexual Activity   Alcohol use: Yes     Comment: occasional   Drug use: No   Sexual activity: Yes    Comment: tubal ligation  Lifestyle   Physical activity    Days per week: 1 day    Minutes per session: 30 min   Stress: Very much  Relationships   Social connections    Talks on phone: More than three times a week    Gets together: More than three times a week    Attends religious service: Never    Active member of club or organization: No    Attends meetings of clubs or organizations: Never  Relationship status: Widowed  Other Topics Concern   Not on file  Social History Narrative   Not on file     Family History: The patient's family history includes Heart attack (age of onset: 78) in her brother; Heart attack (age of onset: 87) in her father. ROS:   Please see the history of present illness.    All 14 point review of systems negative except as described per history of present illness  EKGs/Labs/Other Studies Reviewed:      Recent Labs: 10/31/2018: B Natriuretic Peptide 66.4 12/13/2018: ALT 19; BUN 8; Creatinine, Ser 0.70; Hemoglobin 13.2; Platelets 242; Potassium 4.0; Sodium 136  Recent Lipid Panel No results found for: CHOL, TRIG, HDL, CHOLHDL, VLDL, LDLCALC, LDLDIRECT  Physical Exam:    VS:  BP 100/62    Pulse 95    Ht 5\' 4"  (1.626 m)    Wt 160 lb 12.8 oz (72.9 kg)    SpO2 97%    BMI 27.60 kg/m     Wt Readings from Last 3 Encounters:  12/15/18 160 lb 12.8 oz (72.9 kg)  12/13/18 160 lb (72.6 kg)  11/26/18 164 lb (74.4 kg)     GEN:  Well nourished, well developed in no acute distress HEENT: Normal NECK: No JVD; No carotid bruits LYMPHATICS: No lymphadenopathy CARDIAC: RRR, no murmurs, no rubs, no gallops RESPIRATORY:  Clear to auscultation without rales, wheezing or rhonchi  ABDOMEN: Soft, non-tender, non-distended MUSCULOSKELETAL:  No edema; No deformity  SKIN: Warm and dry LOWER EXTREMITIES: no swelling NEUROLOGIC:  Alert and oriented x 3 PSYCHIATRIC:  Normal affect   ASSESSMENT:      1. Coronary artery disease involving native coronary artery of native heart without angina pectoris   2. Hyperlipidemia LDL goal <70   3. Anxiety   4. Other chronic pain    PLAN:    In order of problems listed above:  1. Coronary disease a stress test recently showing a ischemia involving anterior septal region only small area.  Plan will be most likely proceed with cardiac catheterization but we try Nexium first.  Gallbladder ultrasound will be done as well make sure were not missing any additional problems.  On the physical examination she was tender in epigastrium as well she got some tenderness in the right upper quadrant. 2. Dyslipidemia continue with high intensity statin. 3. Anxiety.  Noted. 4. Chronic pain noted.   Medication Adjustments/Labs and Tests Ordered: Current medicines are reviewed at length with the patient today.  Concerns regarding medicines are outlined above.  No orders of the defined types were placed in this encounter.  Medication changes: No orders of the defined types were placed in this encounter.   Signed, Georgeanna Lea, MD, [facility] 12/15/2018 2:46 PM    [facility]

## 2018-12-22 ENCOUNTER — Telehealth: Payer: Self-pay | Admitting: Emergency Medicine

## 2018-12-22 ENCOUNTER — Telehealth: Payer: Self-pay | Admitting: Cardiology

## 2018-12-22 NOTE — Telephone Encounter (Signed)
Please see previous telephone encounter.

## 2018-12-22 NOTE — Telephone Encounter (Signed)
Returning call.

## 2018-12-22 NOTE — Telephone Encounter (Signed)
Left message for patient to return call to see how they are doing per Dr. Agustin Cree.

## 2018-12-22 NOTE — Telephone Encounter (Signed)
Patient called back. She is still having chest pain. She is currently on the phone has chest pain in the middle of her chest. She reports the pain radiates to her shoulders, causes shortness of breath at times, and is intermittent. Per Dr. Wendy Poet previous visit we were going to try Nexium and have a gall bladder ultrasound to make sure nothing else was missed. Nexium wasn't covered by her insurance, her pcp called her in prilosec 40 mg twice daily instead that she has taken for 4 days with no relief, and her evaluation for her gallbladder is not until November 5th, 2020. Due to active chest pain patient was advised to go to the emergency department. Will consult with DOD for further instruction if needed.

## 2018-12-22 NOTE — Telephone Encounter (Signed)
Called patient back also informed her Dr. Bettina Gavia agrees that she should go to the emergency department. Patient verbally understands.

## 2018-12-22 NOTE — Telephone Encounter (Signed)
I reviewed her note she has an abnormal perfusion study she has coronary disease is having chest pain and I think she should go to the ER either [facility] to be assessed including troponin levels as she may have acute coronary syndrome

## 2018-12-23 ENCOUNTER — Inpatient Hospital Stay
Admission: AD | Admit: 2018-12-23 | Payer: Medicare Other | Source: Other Acute Inpatient Hospital | Admitting: Internal Medicine

## 2018-12-24 DIAGNOSIS — R079 Chest pain, unspecified: Secondary | ICD-10-CM

## 2018-12-24 HISTORY — DX: Chest pain, unspecified: R07.9

## 2018-12-28 ENCOUNTER — Other Ambulatory Visit: Payer: Self-pay | Admitting: Emergency Medicine

## 2018-12-28 ENCOUNTER — Telehealth: Payer: Self-pay | Admitting: Emergency Medicine

## 2018-12-28 MED ORDER — ISOSORBIDE MONONITRATE ER 30 MG PO TB24: 60.0000 mg | ORAL_TABLET | Freq: Every day | ORAL | 2 refills | Status: DC

## 2018-12-28 NOTE — Telephone Encounter (Signed)
Called patient to confirm IMDUR dosage with her. No answer and no voicemail will continue efforts.

## 2018-12-31 ENCOUNTER — Other Ambulatory Visit: Payer: Self-pay | Admitting: Emergency Medicine

## 2018-12-31 MED ORDER — NITROGLYCERIN 0.4 MG SL SUBL: 0.4000 mg | SUBLINGUAL_TABLET | SUBLINGUAL | 3 refills | Status: DC | PRN

## 2018-12-31 NOTE — Telephone Encounter (Signed)
Attempted to contact patient again. No answer/ voicemail.

## 2019-01-01 NOTE — Telephone Encounter (Signed)
Attempted to contact patient again, still no answer or voicemail.

## 2019-01-08 ENCOUNTER — Ambulatory Visit (INDEPENDENT_AMBULATORY_CARE_PROVIDER_SITE_OTHER): Payer: Medicare Other | Admitting: Cardiology

## 2019-01-08 ENCOUNTER — Encounter: Payer: Self-pay | Admitting: Cardiology

## 2019-01-08 ENCOUNTER — Other Ambulatory Visit: Payer: Self-pay

## 2019-01-08 VITALS — BP 106/62 | HR 92 | Ht 64.0 in | Wt 158.0 lb

## 2019-01-08 DIAGNOSIS — E785 Hyperlipidemia, unspecified: Secondary | ICD-10-CM

## 2019-01-08 DIAGNOSIS — I2510 Atherosclerotic heart disease of native coronary artery without angina pectoris: Secondary | ICD-10-CM | POA: Diagnosis not present

## 2019-01-08 DIAGNOSIS — G8929 Other chronic pain: Secondary | ICD-10-CM

## 2019-01-08 NOTE — Patient Instructions (Signed)
Medication Instructions:  Your physician recommends that you continue on your current medications as directed. Please refer to the Current Medication list given to you today.  *If you need a refill on your cardiac medications before your next appointment, please call your pharmacy*  Lab Work: Your physician recommends that you return for lab work today: lipids   If you have labs (blood work) drawn today and your tests are completely normal, you will receive your results only by: . MyChart Message (if you have MyChart) OR . A paper copy in the mail If you have any lab test that is abnormal or we need to change your treatment, we will call you to review the results.  Testing/Procedures: None.   Follow-Up: At [facility], you and your health needs are our priority.  As part of our continuing mission to provide you with exceptional heart care, we have created designated Provider Care Teams.  These Care Teams include your primary Cardiologist (physician) and Advanced Practice Providers (APPs -  Physician Assistants and Nurse Practitioners) who all work together to provide you with the care you need, when you need it.  Your next appointment:   4 month(s)  The format for your next appointment:   In Person  Provider:   Robert Krasowski, MD  Other Instructions    

## 2019-01-08 NOTE — Addendum Note (Signed)
Addended by: Ashok Norris on: 01/08/2019 04:53 PM   Modules accepted: Orders

## 2019-01-08 NOTE — Progress Notes (Signed)
Cardiology Office Note:    Date:  01/08/2019   ID:  Melody Myers, DOB [birth year], MRN [id number]  PCP:  Laurel Dimmer, FNP  Cardiologist:  Gypsy Balsam, MD    Referring MD: Selena Batten *   Chief Complaint  Patient presents with  . 3 week follow up  Still the same  History of Present Illness:    Melody Myers is a 38 y.o. female with past medical history significant for coronary artery disease in August of this year she required stent to obtuse marginal branch she did have residual moderate disease.  I seen him few weeks ago and we will talk about potential alternatives to her pain that she still experiencing I schedule her to have gallbladder ultrasounds apparently that came fine she ended going back to hospital with some chest pain she was sent to [facility] where she got cardiac catheterization done.  I do not have documentation of this but according to her nothing was fine she said cardiac catheterization with the same like before they told her pain is not related to her heart.  It looks like it may be some GI related and I told her to see her stomach specialist.  We talked about the risk factors modification with talk about need to quit smoking she smokes about 1 to 2 cigarettes a day now I told her ultimate goal is to completely discontinue.  I will schedule her to have fasting lipid profile done I will make sure her LDL is less than 70.  I see her back in about 3 months or sooner if she has a problem  Past Medical History:  Diagnosis Date  . Anxiety   . Asthma   . Bipolar 1 disorder (HCC)   . Depression    patient reports bipolor  . Depression   . Diabetic neuropathy (HCC)   . Insulin dependent diabetes mellitus with complications    insulin dependant  . Syncopal episodes     Past Surgical History:  Procedure Laterality Date  . CORONARY STENT INTERVENTION N/A 10/02/2018   Procedure: CORONARY STENT INTERVENTION;  Surgeon: Yvonne Kendall, MD;   Location: [facility];  Service: Cardiovascular;  Laterality: N/A;  . LEFT HEART CATH AND CORONARY ANGIOGRAPHY N/A 10/02/2018   Procedure: LEFT HEART CATH AND CORONARY ANGIOGRAPHY;  Surgeon: Yvonne Kendall, MD;  Location: [facility];  Service: Cardiovascular;  Laterality: N/A;  . TUBAL LIGATION    . VAGINA RECONSTRUCTION SURGERY  10/2014   & ana, reconstruction    Current Medications: Current Meds  Medication Sig  . acetaminophen (TYLENOL) 325 MG tablet Take 650 mg by mouth every 6 (six) hours as needed for moderate pain or headache.   . ALPRAZolam (XANAX) 1 MG tablet Take 1 mg by mouth 2 (two) times daily.   Marland Kitchen aspirin EC 81 MG EC tablet Take 1 tablet (81 mg total) by mouth daily.  Marland Kitchen atorvastatin (LIPITOR) 80 MG tablet Take 1 tablet (80 mg total) by mouth daily at 6 PM.  . buPROPion (WELLBUTRIN XL) 150 MG 24 hr tablet Take 150 mg by mouth daily.   . cariprazine (VRAYLAR) capsule Take 3 mg by mouth daily.  . clotrimazole (LOTRIMIN) 1 % cream Apply 1 application topically 2 (two) times daily as needed. Rash  . doxycycline (VIBRAMYCIN) 100 MG capsule Take 100 mg by mouth as needed (spot on her Groin).   Marland Kitchen esomeprazole (NEXIUM) 40 MG capsule Take 1 capsule (40 mg total) by mouth daily at  12 noon.  . Fluticasone-Salmeterol (ADVAIR) 250-50 MCG/DOSE AEPB Inhale 1 puff into the lungs 2 (two) times daily.  Marland Kitchen GLUCAGON EMERGENCY 1 MG injection Inject 1 mg as directed once.  . insulin aspart (NOVOLOG) 100 UNIT/ML injection Inject 10-15 Units into the skin 4 (four) times daily. Sliding scale  . Insulin Glargine (BASAGLAR KWIKPEN) 100 UNIT/ML SOPN Inject 32 Units into the skin at bedtime.  Marland Kitchen ipratropium-albuterol (DUONEB) 0.5-2.5 (3) MG/3ML SOLN Take 3 mLs by nebulization 2 (two) times daily.   Marland Kitchen loperamide (IMODIUM) 2 MG capsule Take 2 mg by mouth as needed for diarrhea or loose stools.  . metoprolol tartrate (LOPRESSOR) 25 MG tablet Take 1 tablet (25 mg total) by mouth 2 (two) times  daily.  . montelukast (SINGULAIR) 10 MG tablet Take 10 mg by mouth at bedtime.  . Multiple Vitamin (MULTIVITAMIN WITH MINERALS) TABS Take 1 tablet by mouth daily.  . nitroGLYCERIN (NITROSTAT) 0.4 MG SL tablet Place 1 tablet (0.4 mg total) under the tongue every 5 (five) minutes x 3 doses as needed for chest pain.  Marland Kitchen omeprazole (PRILOSEC) 40 MG capsule Take 40 mg by mouth daily.  . ondansetron (ZOFRAN ODT) 4 MG disintegrating tablet Take 1 tablet (4 mg total) by mouth every 8 (eight) hours as needed for nausea.  . ticagrelor (BRILINTA) 90 MG TABS tablet Take 1 tablet (90 mg total) by mouth 2 (two) times daily.  . VENTOLIN HFA 108 (90 Base) MCG/ACT inhaler Inhale 2 puffs into the lungs every 4 (four) hours as needed.     Allergies:   Amoxicillin, Latex, Penicillins, and Prednisone   Social History   Socioeconomic History  . Marital status: Single    Spouse name: Not on file  . Number of children: Not on file  . Years of education: Not on file  . Highest education level: Not on file  Occupational History  . Not on file  Social Needs  . Financial resource strain: Hard  . Food insecurity    Worry: Often true    Inability: Sometimes true  . Transportation needs    Medical: Yes    Non-medical: Yes  Tobacco Use  . Smoking status: Former Smoker    Packs/day: 0.25    Types: Cigarettes  . Smokeless tobacco: Never Used  Substance and Sexual Activity  . Alcohol use: Yes    Comment: occasional  . Drug use: No  . Sexual activity: Yes    Comment: tubal ligation  Lifestyle  . Physical activity    Days per week: 1 day    Minutes per session: 30 min  . Stress: Very much  Relationships  . Social connections    Talks on phone: More than three times a week    Gets together: More than three times a week    Attends religious service: Never    Active member of club or organization: No    Attends meetings of clubs or organizations: Never    Relationship status: Widowed  Other Topics  Concern  . Not on file  Social History Narrative  . Not on file     Family History: The patient's family history includes Heart attack (age of onset: 62) in her brother; Heart attack (age of onset: 59) in her father. ROS:   Please see the history of present illness.    All 14 point review of systems negative except as described per history of present illness  EKGs/Labs/Other Studies Reviewed:      Recent Labs: 10/31/2018:  B Natriuretic Peptide 66.4 12/13/2018: ALT 19; BUN 8; Creatinine, Ser 0.70; Hemoglobin 13.2; Platelets 242; Potassium 4.0; Sodium 136  Recent Lipid Panel No results found for: CHOL, TRIG, HDL, CHOLHDL, VLDL, LDLCALC, LDLDIRECT  Physical Exam:    VS:  BP 106/62   Pulse 92   Ht 5\' 4"  (1.626 m)   Wt 158 lb (71.7 kg)   SpO2 97%   BMI 27.12 kg/m     Wt Readings from Last 3 Encounters:  01/08/19 158 lb (71.7 kg)  12/15/18 160 lb 12.8 oz (72.9 kg)  12/13/18 160 lb (72.6 kg)     GEN:  Well nourished, well developed in no acute distress HEENT: Normal NECK: No JVD; No carotid bruits LYMPHATICS: No lymphadenopathy CARDIAC: RRR, no murmurs, no rubs, no gallops RESPIRATORY:  Clear to auscultation without rales, wheezing or rhonchi  ABDOMEN: Soft, non-tender, non-distended MUSCULOSKELETAL:  No edema; No deformity  SKIN: Warm and dry LOWER EXTREMITIES: no swelling NEUROLOGIC:  Alert and oriented x 3 PSYCHIATRIC:  Normal affect   ASSESSMENT:    1. Coronary artery disease involving native coronary artery of native heart without angina pectoris   2. Hyperlipidemia LDL goal <70   3. Other chronic pain    PLAN:    In order of problems listed above:  1. Coronary disease stable cardiac catheterization always she is still on dual antiplatelets therapy after stent implanted in August 2 obtuse marginal branch.  We will continue.  Recent cardiac catheterization done after stress test done showing some abnormality showed no new lesions.  I will get a copy of it to  confirm that. 2. Dyslipidemia fasting lipid profile will be done 3. Chronic pains including some GI issues she will see her stomach specialist.   Medication Adjustments/Labs and Tests Ordered: Current medicines are reviewed at length with the patient today.  Concerns regarding medicines are outlined above.  No orders of the defined types were placed in this encounter.  Medication changes: No orders of the defined types were placed in this encounter.   Signed, Park Liter, MD, [facility] 01/08/2019 4:30 PM    [place]

## 2019-01-09 ENCOUNTER — Other Ambulatory Visit: Payer: Self-pay | Admitting: Cardiology

## 2019-03-29 LAB — HM PAP SMEAR: HM Pap smear: NORMAL

## 2019-04-05 ENCOUNTER — Other Ambulatory Visit: Payer: Self-pay

## 2019-04-05 ENCOUNTER — Ambulatory Visit (INDEPENDENT_AMBULATORY_CARE_PROVIDER_SITE_OTHER): Payer: Medicare Other | Admitting: Cardiology

## 2019-04-05 ENCOUNTER — Encounter: Payer: Self-pay | Admitting: Cardiology

## 2019-04-05 VITALS — BP 116/72 | HR 91 | Ht 64.0 in | Wt 156.0 lb

## 2019-04-05 DIAGNOSIS — E785 Hyperlipidemia, unspecified: Secondary | ICD-10-CM | POA: Diagnosis not present

## 2019-04-05 DIAGNOSIS — I2510 Atherosclerotic heart disease of native coronary artery without angina pectoris: Secondary | ICD-10-CM | POA: Diagnosis not present

## 2019-04-05 DIAGNOSIS — F319 Bipolar disorder, unspecified: Secondary | ICD-10-CM

## 2019-04-05 DIAGNOSIS — Z720 Tobacco use: Secondary | ICD-10-CM | POA: Diagnosis not present

## 2019-04-05 NOTE — Addendum Note (Signed)
Addended by: Lita Mains on: 04/05/2019 03:10 PM   Modules accepted: Orders

## 2019-04-05 NOTE — Patient Instructions (Signed)
Medication Instructions:  Your physician recommends that you continue on your current medications as directed. Please refer to the Current Medication list given to you today.  *If you need a refill on your cardiac medications before your next appointment, please call your pharmacy*  Lab Work: Your physician recommends that you return for lab work today: lipids   If you have labs (blood work) drawn today and your tests are completely normal, you will receive your results only by: . MyChart Message (if you have MyChart) OR . A paper copy in the mail If you have any lab test that is abnormal or we need to change your treatment, we will call you to review the results.  Testing/Procedures: None.   Follow-Up: At [facility], you and your health needs are our priority.  As part of our continuing mission to provide you with exceptional heart care, we have created designated Provider Care Teams.  These Care Teams include your primary Cardiologist (physician) and Advanced Practice Providers (APPs -  Physician Assistants and Nurse Practitioners) who all work together to provide you with the care you need, when you need it.  Your next appointment:   5 month(s)  The format for your next appointment:   In Person  Provider:   Robert Krasowski, MD  Other Instructions  

## 2019-04-05 NOTE — Progress Notes (Signed)
Cardiology Office Note:    Date:  04/05/2019   ID:  Melody Myers, DOB [birth year], MRN [id number]  PCP:  Marisue Humble, FNP  Cardiologist:  Jenne Campus, MD    Referring MD: Denzil Magnuson *   Chief Complaint  Patient presents with  . Follow-up    4 MO FU Chest Pain     History of Present Illness:    Melody Myers is a 39 y.o. female with past medical history significant for coronary artery disease.  In August 2020 she did have PTCA and stenting done to obtuse marginal branch.  Since that time she been continuously complaining of having a different kind of pain eventually she ended up going to 4 side and had another cardiac catheterization done at the end of last year which showed nonobstructive lesions.  Moderate disease noted.  Since that time she did see GI specialist who gave her some proton pump inhibitor which does not help.  She complained of having pain all over.  When I touch her chest it hurts.  She is complain of being weak tired and exhausted at the same time.  Sadly she still continue to smoke likely only 2 cigarettes a day.  Past Medical History:  Diagnosis Date  . Anxiety   . Asthma   . Bipolar 1 disorder ([place])   . Depression    patient reports bipolor  . Depression   . Diabetic neuropathy ([place])   . Insulin dependent diabetes mellitus with complications    insulin dependant  . Syncopal episodes     Past Surgical History:  Procedure Laterality Date  . CORONARY STENT INTERVENTION N/A 10/02/2018   Procedure: CORONARY STENT INTERVENTION;  Surgeon: Nelva Bush, MD;  Location: [facility];  Service: Cardiovascular;  Laterality: N/A;  . LEFT HEART CATH AND CORONARY ANGIOGRAPHY N/A 10/02/2018   Procedure: LEFT HEART CATH AND CORONARY ANGIOGRAPHY;  Surgeon: Nelva Bush, MD;  Location: [facility];  Service: Cardiovascular;  Laterality: N/A;  . TUBAL LIGATION    . VAGINA RECONSTRUCTION SURGERY  10/2014   & ana, reconstruction     Current Medications: Current Meds  Medication Sig  . acetaminophen (TYLENOL) 325 MG tablet Take 650 mg by mouth every 6 (six) hours as needed for moderate pain or headache.   . ALPRAZolam (XANAX) 1 MG tablet Take 1 mg by mouth 2 (two) times daily.   Marland Kitchen aspirin EC 81 MG EC tablet Take 1 tablet (81 mg total) by mouth daily.  Marland Kitchen atorvastatin (LIPITOR) 80 MG tablet Take 1 tablet (80 mg total) by mouth daily at 6 PM.  . buPROPion (WELLBUTRIN XL) 150 MG 24 hr tablet Take 150 mg by mouth daily.   . cariprazine (VRAYLAR) capsule Take 3 mg by mouth daily.  . clotrimazole (LOTRIMIN) 1 % cream Apply 1 application topically 2 (two) times daily as needed. Rash  . doxycycline (VIBRAMYCIN) 100 MG capsule Take 100 mg by mouth as needed (spot on her Groin).   Marland Kitchen esomeprazole (NEXIUM) 40 MG capsule Take 1 capsule (40 mg total) by mouth daily at 12 noon.  . Fluticasone-Salmeterol (ADVAIR) 250-50 MCG/DOSE AEPB Inhale 1 puff into the lungs 2 (two) times daily.  Marland Kitchen GLUCAGON EMERGENCY 1 MG injection Inject 1 mg as directed once.  . insulin aspart (NOVOLOG) 100 UNIT/ML injection Inject 10-15 Units into the skin 4 (four) times daily. Sliding scale  . Insulin Glargine (BASAGLAR KWIKPEN) 100 UNIT/ML SOPN Inject 32 Units into the skin at bedtime.  Marland Kitchen  ipratropium-albuterol (DUONEB) 0.5-2.5 (3) MG/3ML SOLN Take 3 mLs by nebulization 2 (two) times daily.   Marland Kitchen loperamide (IMODIUM) 2 MG capsule Take 2 mg by mouth as needed for diarrhea or loose stools.  . metoprolol tartrate (LOPRESSOR) 25 MG tablet Take 1 tablet (25 mg total) by mouth 2 (two) times daily.  . montelukast (SINGULAIR) 10 MG tablet Take 10 mg by mouth at bedtime.  . Multiple Vitamin (MULTIVITAMIN WITH MINERALS) TABS Take 1 tablet by mouth daily.  . nitroGLYCERIN (NITROSTAT) 0.4 MG SL tablet Place 1 tablet (0.4 mg total) under the tongue every 5 (five) minutes x 3 doses as needed for chest pain.  Marland Kitchen omeprazole (PRILOSEC) 40 MG capsule Take 40 mg by mouth daily.  .  ondansetron (ZOFRAN ODT) 4 MG disintegrating tablet Take 1 tablet (4 mg total) by mouth every 8 (eight) hours as needed for nausea.  . ticagrelor (BRILINTA) 90 MG TABS tablet Take 1 tablet (90 mg total) by mouth 2 (two) times daily.  . VENTOLIN HFA 108 (90 Base) MCG/ACT inhaler Inhale 2 puffs into the lungs every 4 (four) hours as needed.     Allergies:   Amoxicillin, Latex, Penicillins, and Prednisone   Social History   Socioeconomic History  . Marital status: Single    Spouse name: Not on file  . Number of children: Not on file  . Years of education: Not on file  . Highest education level: Not on file  Occupational History  . Not on file  Tobacco Use  . Smoking status: Former Smoker    Packs/day: 0.25    Types: Cigarettes  . Smokeless tobacco: Never Used  Substance and Sexual Activity  . Alcohol use: Yes    Comment: occasional  . Drug use: No  . Sexual activity: Yes    Comment: tubal ligation  Other Topics Concern  . Not on file  Social History Narrative  . Not on file   Social Determinants of Health   Financial Resource Strain: High Risk  . Difficulty of Paying Living Expenses: Hard  Food Insecurity: Food Insecurity Present  . Worried About Programme researcher, broadcasting/film/video in the Last Year: Often true  . Ran Out of Food in the Last Year: Sometimes true  Transportation Needs: Unmet Transportation Needs  . Lack of Transportation (Medical): Yes  . Lack of Transportation (Non-Medical): Yes  Physical Activity: Insufficiently Active  . Days of Exercise per Week: 1 day  . Minutes of Exercise per Session: 30 min  Stress: Stress Concern Present  . Feeling of Stress : Very much  Social Connections: Moderately Isolated  . Frequency of Communication with Friends and Family: More than three times a week  . Frequency of Social Gatherings with Friends and Family: More than three times a week  . Attends Religious Services: Never  . Active Member of Clubs or Organizations: No  . Attends  Banker Meetings: Never  . Marital Status: Widowed     Family History: The patient's family history includes Heart attack (age of onset: 60) in her brother; Heart attack (age of onset: 66) in her father. ROS:   Please see the history of present illness.    All 14 point review of systems negative except as described per history of present illness  EKGs/Labs/Other Studies Reviewed:      Recent Labs: 10/31/2018: B Natriuretic Peptide 66.4 12/13/2018: ALT 19; BUN 8; Creatinine, Ser 0.70; Hemoglobin 13.2; Platelets 242; Potassium 4.0; Sodium 136  Recent Lipid Panel No results  found for: CHOL, TRIG, HDL, CHOLHDL, VLDL, LDLCALC, LDLDIRECT  Physical Exam:    VS:  BP 116/72   Pulse 91   Ht 5\' 4"  (1.626 m)   Wt 156 lb (70.8 kg)   SpO2 95%   BMI 26.78 kg/m     Wt Readings from Last 3 Encounters:  04/05/19 156 lb (70.8 kg)  01/08/19 158 lb (71.7 kg)  12/15/18 160 lb 12.8 oz (72.9 kg)     GEN:  Well nourished, well developed in no acute distress HEENT: Normal NECK: No JVD; No carotid bruits LYMPHATICS: No lymphadenopathy CARDIAC: RRR, no murmurs, no rubs, no gallops RESPIRATORY:  Clear to auscultation without rales, wheezing or rhonchi  ABDOMEN: Soft, non-tender, non-distended MUSCULOSKELETAL:  No edema; No deformity  SKIN: Warm and dry LOWER EXTREMITIES: no swelling NEUROLOGIC:  Alert and oriented x 3 PSYCHIATRIC:  Normal affect   ASSESSMENT:    1. Coronary artery disease involving native coronary artery of native heart without angina pectoris   2. Hyperlipidemia LDL goal <70   3. Tobacco abuse   4. Bipolar depression (H[facility])    PLAN:    In order of problems listed above:  1. Coronary disease status post PTCA and stenting of the obtuse marginal branch done in August 2020.  Still on dual antiplatelets therapy which I will continue for 12 months. 2. Dyslipidemia she is taking high intensity statin I will check her fasting lipid profile today. 3. Tobacco abuse:  Again long discussion about this at least 10 minutes and strongly recommend to quit. 4. Bipolar disorder stable   Medication Adjustments/Labs and Tests Ordered: Current medicines are reviewed at length with the patient today.  Concerns regarding medicines are outlined above.  No orders of the defined types were placed in this encounter.  Medication changes: No orders of the defined types were placed in this encounter.   Signed, September 2020, MD, [facility] 04/05/2019 3:04 PM    [facility]

## 2019-04-06 LAB — LIPID PANEL
Chol/HDL Ratio: 2.7 ratio (ref 0.0–4.4)
Cholesterol, Total: 109 mg/dL (ref 100–199)
HDL: 40 mg/dL (ref >39)
LDL Chol Calc (NIH): 55 mg/dL (ref 0–99)
Triglycerides: 66 mg/dL (ref 0–149)
VLDL Cholesterol Cal: 14 mg/dL (ref 5–40)

## 2019-04-12 DIAGNOSIS — L600 Ingrowing nail: Secondary | ICD-10-CM | POA: Insufficient documentation

## 2019-04-12 HISTORY — DX: Ingrowing nail: L60.0

## 2019-05-03 ENCOUNTER — Ambulatory Visit: Payer: Medicare Other | Admitting: Cardiology

## 2019-05-17 ENCOUNTER — Telehealth: Payer: Self-pay | Admitting: Emergency Medicine

## 2019-05-17 NOTE — Telephone Encounter (Signed)
Attempted to call patient regarding mychart message. No answer and no voicemail on one number and the other number was answered with a odd recording that never connected me to anyone, will message patient back on mychart.

## 2019-05-18 NOTE — Telephone Encounter (Signed)
Left message for patient contact Barbara Cower to have patient to return call.

## 2019-05-20 NOTE — Telephone Encounter (Signed)
Attempted to call patient again no voicemail and no answer.

## 2019-05-25 ENCOUNTER — Other Ambulatory Visit: Payer: Self-pay | Admitting: Cardiology

## 2019-05-27 NOTE — Telephone Encounter (Signed)
Both numbers in patient's chart are not working numbers. Called the number for Barbara Cower again in her chart left another message for patient to return call.

## 2019-06-07 NOTE — Telephone Encounter (Signed)
Left message on Jason's number again for patient to return call regarding mychart message. Other two numbers are still not working.

## 2019-07-21 ENCOUNTER — Ambulatory Visit: Payer: Medicare Other

## 2019-07-24 ENCOUNTER — Ambulatory Visit: Payer: Medicare Other

## 2019-08-04 ENCOUNTER — Telehealth: Payer: Self-pay | Admitting: Cardiology

## 2019-08-04 NOTE — Telephone Encounter (Signed)
New Message  Pt c/o of Chest Pain: STAT if CP now or developed within 24 hours  1. Are you having CP right now? Yes  2. Are you experiencing any other symptoms (ex. SOB, nausea, vomiting, sweating)? Chest pain, SOB, pain in right arm, fingers numb and tingling, pain in neck, sweating,   3. How long have you been experiencing CP? Started 4 days ago and is getting worse.   4. Is your CP continuous or coming and going? Coming and Going   5. Have you taken Nitroglycerin? No ?

## 2019-08-04 NOTE — Telephone Encounter (Signed)
Spoke with pt and advised her to go to the Emergency Department for her sx as they are concerning. Pt verbalized understanding and had no additional questions.

## 2019-08-05 ENCOUNTER — Other Ambulatory Visit: Payer: Self-pay | Admitting: Cardiology

## 2019-09-13 ENCOUNTER — Ambulatory Visit (INDEPENDENT_AMBULATORY_CARE_PROVIDER_SITE_OTHER): Payer: Medicare Other | Admitting: Cardiology

## 2019-09-13 ENCOUNTER — Encounter: Payer: Self-pay | Admitting: Cardiology

## 2019-09-13 ENCOUNTER — Other Ambulatory Visit: Payer: Self-pay

## 2019-09-13 VITALS — BP 118/60 | HR 86 | Ht 64.0 in | Wt 140.0 lb

## 2019-09-13 DIAGNOSIS — Z720 Tobacco use: Secondary | ICD-10-CM

## 2019-09-13 DIAGNOSIS — I2510 Atherosclerotic heart disease of native coronary artery without angina pectoris: Secondary | ICD-10-CM

## 2019-09-13 DIAGNOSIS — E785 Hyperlipidemia, unspecified: Secondary | ICD-10-CM | POA: Diagnosis not present

## 2019-09-13 MED ORDER — CLOPIDOGREL BISULFATE 75 MG PO TABS: 75.0000 mg | ORAL_TABLET | Freq: Every day | ORAL | 3 refills | Status: DC

## 2019-09-13 NOTE — Progress Notes (Signed)
Cardiology Office Note:    Date:  09/13/2019   ID:  Melody Myers, DOB May 01, 1980, MRN [id number]  PCP:  Laurel Dimmer, FNP  Cardiologist:  Gypsy Balsam, MD    Referring MD: Selena Batten *   No chief complaint on file. I am doing better  History of Present Illness:    Melody Myers is a 39 y.o. female with past medical history significant for coronary artery disease in August 2020 she got PTCA and stenting done to obtuse marginal branch.  Her other past medical history include diabetes, dyslipidemia, smoking which is ongoing.  Recently she underwent to the hospital because of atypical chest pain.  Cardiac work-up has been negative.  Since that time she is doing fine.  She works she works at [employer] at night that involve carrying heavy boxes and she complained of having fatigue and tiredness but otherwise seems to be doing well.  Complain of having some bruising in her upper and lower extremities.  There was some issue about potentially having ITP, her platelets count from hospital were 205 thousand.  Past Medical History:  Diagnosis Date  . Anxiety   . Asthma   . Bipolar 1 disorder ([facility])   . Depression    patient reports bipolor  . Depression   . Diabetic neuropathy ([facility])   . Insulin dependent diabetes mellitus with complications    insulin dependant  . Syncopal episodes     Past Surgical History:  Procedure Laterality Date  . CORONARY STENT INTERVENTION N/A 10/02/2018   Procedure: CORONARY STENT INTERVENTION;  Surgeon: Yvonne Kendall, MD;  Location: [facility];  Service: Cardiovascular;  Laterality: N/A;  . LEFT HEART CATH AND CORONARY ANGIOGRAPHY N/A 10/02/2018   Procedure: LEFT HEART CATH AND CORONARY ANGIOGRAPHY;  Surgeon: Yvonne Kendall, MD;  Location: [facility];  Service: Cardiovascular;  Laterality: N/A;  . TUBAL LIGATION    . VAGINA RECONSTRUCTION SURGERY  10/2014   & ana, reconstruction    Current Medications: Current Meds   Medication Sig  . acetaminophen (TYLENOL) 325 MG tablet Take 650 mg by mouth every 6 (six) hours as needed for moderate pain or headache.   Marland Kitchen aspirin EC 81 MG EC tablet Take 1 tablet (81 mg total) by mouth daily.  Marland Kitchen atorvastatin (LIPITOR) 80 MG tablet Take 1 tablet (80 mg total) by mouth daily at 6 PM.  . clotrimazole (LOTRIMIN) 1 % cream Apply 1 application topically 2 (two) times daily as needed. Rash  . doxycycline (VIBRAMYCIN) 100 MG capsule Take 100 mg by mouth as needed (spot on her Groin).   Marland Kitchen esomeprazole (NEXIUM) 40 MG capsule Take 1 capsule (40 mg total) by mouth daily at 12 noon.  . Fluticasone-Salmeterol (ADVAIR) 250-50 MCG/DOSE AEPB Inhale 1 puff into the lungs 2 (two) times daily.  Marland Kitchen gabapentin (NEURONTIN) 300 MG capsule Take 1-2 capsules by mouth as needed.  Marland Kitchen GLUCAGON EMERGENCY 1 MG injection Inject 1 mg as directed once.  . insulin aspart (NOVOLOG) 100 UNIT/ML injection Inject 10-15 Units into the skin 4 (four) times daily. Sliding scale  . Insulin Glargine (BASAGLAR KWIKPEN) 100 UNIT/ML SOPN Inject 32 Units into the skin at bedtime.  Marland Kitchen ipratropium-albuterol (DUONEB) 0.5-2.5 (3) MG/3ML SOLN Take 3 mLs by nebulization 2 (two) times daily.   Marland Kitchen loperamide (IMODIUM) 2 MG capsule Take 2 mg by mouth as needed for diarrhea or loose stools.  . metoprolol tartrate (LOPRESSOR) 25 MG tablet Take 1 tablet (25 mg total) by mouth 2 (  two) times daily.  . Multiple Vitamin (MULTIVITAMIN WITH MINERALS) TABS Take 1 tablet by mouth daily.  . nitroGLYCERIN (NITROSTAT) 0.4 MG SL tablet PLACE 1 TABLET UNDER THE TONGUE EVERY 5 MINUTES X 3 DOSES AS NEEDED FOR CHEST PAIN. MAX3TAB/15MIN  . omeprazole (PRILOSEC) 40 MG capsule Take 40 mg by mouth daily.  . ondansetron (ZOFRAN ODT) 4 MG disintegrating tablet Take 1 tablet (4 mg total) by mouth every 8 (eight) hours as needed for nausea.  . ticagrelor (BRILINTA) 90 MG TABS tablet Take 1 tablet (90 mg total) by mouth 2 (two) times daily.  . VENTOLIN HFA 108  (90 Base) MCG/ACT inhaler Inhale 2 puffs into the lungs every 4 (four) hours as needed.     Allergies:   Amoxicillin, Latex, Penicillins, and Prednisone   Social History   Socioeconomic History  . Marital status: Single    Spouse name: Not on file  . Number of children: Not on file  . Years of education: Not on file  . Highest education level: Not on file  Occupational History  . Not on file  Tobacco Use  . Smoking status: Former Smoker    Packs/day: 0.25    Types: Cigarettes  . Smokeless tobacco: Never Used  Vaping Use  . Vaping Use: Never used  Substance and Sexual Activity  . Alcohol use: Yes    Comment: occasional  . Drug use: No  . Sexual activity: Yes    Comment: tubal ligation  Other Topics Concern  . Not on file  Social History Narrative  . Not on file   Social Determinants of Health   Financial Resource Strain: High Risk  . Difficulty of Paying Living Expenses: Hard  Food Insecurity: Food Insecurity Present  . Worried About Programme researcher, broadcasting/film/video in the Last Year: Often true  . Ran Out of Food in the Last Year: Sometimes true  Transportation Needs: Unmet Transportation Needs  . Lack of Transportation (Medical): Yes  . Lack of Transportation (Non-Medical): Yes  Physical Activity: Insufficiently Active  . Days of Exercise per Week: 1 day  . Minutes of Exercise per Session: 30 min  Stress: Stress Concern Present  . Feeling of Stress : Very much  Social Connections: Socially Isolated  . Frequency of Communication with Friends and Family: More than three times a week  . Frequency of Social Gatherings with Friends and Family: More than three times a week  . Attends Religious Services: Never  . Active Member of Clubs or Organizations: No  . Attends Banker Meetings: Never  . Marital Status: Widowed     Family History: The patient's family history includes Heart attack (age of onset: 35) in her brother; Heart attack (age of onset: 60) in her  father. ROS:   Please see the history of present illness.    All 14 point review of systems negative except as described per history of present illness  EKGs/Labs/Other Studies Reviewed:      Recent Labs: 10/31/2018: B Natriuretic Peptide 66.4 12/13/2018: ALT 19; BUN 8; Creatinine, Ser 0.70; Hemoglobin 13.2; Platelets 242; Potassium 4.0; Sodium 136  Recent Lipid Panel    Component Value Date/Time   CHOL 109 04/05/2019 1513   TRIG 66 04/05/2019 1513   HDL 40 04/05/2019 1513   CHOLHDL 2.7 04/05/2019 1513   LDLCALC 55 04/05/2019 1513    Physical Exam:    VS:  BP 118/60   Pulse 86   Ht 5\' 4"  (1.626 m)   Wt  140 lb (63.5 kg)   SpO2 96%   BMI 24.03 kg/m     Wt Readings from Last 3 Encounters:  09/13/19 140 lb (63.5 kg)  04/05/19 156 lb (70.8 kg)  01/08/19 158 lb (71.7 kg)     GEN:  Well nourished, well developed in no acute distress HEENT: Normal NECK: No JVD; No carotid bruits LYMPHATICS: No lymphadenopathy CARDIAC: RRR, no murmurs, no rubs, no gallops RESPIRATORY:  Clear to auscultation without rales, wheezing or rhonchi  ABDOMEN: Soft, non-tender, non-distended MUSCULOSKELETAL:  No edema; No deformity  SKIN: Warm and dry LOWER EXTREMITIES: no swelling NEUROLOGIC:  Alert and oriented x 3 PSYCHIATRIC:  Normal affect   ASSESSMENT:    1. Coronary artery disease involving native coronary artery of native heart without angina pectoris   2. Hyperlipidemia LDL goal <70   3. Tobacco abuse    PLAN:    In order of problems listed above:  1. Coronary disease status post PTCA and stenting done in August of last year.  Stable from that point review.  She was on dual antiplatelet therapy for 1 year however I would like to extend duration of this therapy secondary to the fact she still smokes half diabetes as well as dyslipidemia. 2. Dyslipidemia she is on high intense statin which I will continue.  We will check her fasting lipid profile today. 3. Tobacco abuse we spent at  least 5 minutes talking about need to quit she understands she will try to do it. 4. We did talk about healthy lifestyle need to exercise on the regular basis which she does while working at [employer] now.   Medication Adjustments/Labs and Tests Ordered: Current medicines are reviewed at length with the patient today.  Concerns regarding medicines are outlined above.  No orders of the defined types were placed in this encounter.  Medication changes: No orders of the defined types were placed in this encounter.   Signed, Georgeanna Lea, MD, [facility] 09/13/2019 11:40 AM    [facility]

## 2019-09-13 NOTE — Patient Instructions (Signed)
Medication Instructions:  Your physician has recommended you make the following change in your medication:   Start Plavix 75 mg daily once Brilinta is completed.  *If you need a refill on your cardiac medications before your next appointment, please call your pharmacy*   Lab Work: Your physician recommends that you have a Lipid panel today in the office. If you have labs (blood work) drawn today and your tests are completely normal, you will receive your results only by: Marland Kitchen MyChart Message (if you have MyChart) OR . A paper copy in the mail If you have any lab test that is abnormal or we need to change your treatment, we will call you to review the results.   Testing/Procedures: None ordered   Follow-Up: At [facility], you and your health needs are our priority.  As part of our continuing mission to provide you with exceptional heart care, we have created designated Provider Care Teams.  These Care Teams include your primary Cardiologist (physician) and Advanced Practice Providers (APPs -  Physician Assistants and Nurse Practitioners) who all work together to provide you with the care you need, when you need it.  We recommend signing up for the patient portal called "MyChart".  Sign up information is provided on this After Visit Summary.  MyChart is used to connect with patients for Virtual Visits (Telemedicine).  Patients are able to view lab/test results, encounter notes, upcoming appointments, etc.  Non-urgent messages can be sent to your provider as well.   To learn more about what you can do with MyChart, go to [email or web address].    Your next appointment:   5 month(s)  The format for your next appointment:   In Person  Provider:   Gypsy Balsam, MD   Other Instructions Clopidogrel tablets What is this medicine? CLOPIDOGREL (kloh PID oh grel) helps to prevent blood clots. This medicine is used to prevent heart attack, stroke, or other vascular events in people  who are at high risk. This medicine may be used for other purposes; ask your health care provider or pharmacist if you have questions. COMMON BRAND NAME(S): Plavix What should I tell my health care provider before I take this medicine? They need to know if you have any of the following conditions:  bleeding disorders  bleeding in the brain  having surgery  history of stomach bleeding  an unusual or allergic reaction to clopidogrel, other medicines, foods, dyes, or preservatives  pregnant or trying to get pregnant  breast-feeding How should I use this medicine? Take this medicine by mouth with a glass of water. Follow the directions on the prescription label. You may take this medicine with or without food. If it upsets your stomach, take it with food. Take your medicine at regular intervals. Do not take it more often than directed. Do not stop taking except on your doctor's advice. A special MedGuide will be given to you by the pharmacist with each prescription and refill. Be sure to read this information carefully each time. Talk to your pediatrician regarding the use of this medicine in children. Special care may be needed. Overdosage: If you think you have taken too much of this medicine contact a poison control center or emergency room at once. NOTE: This medicine is only for you. Do not share this medicine with others. What if I miss a dose? If you miss a dose, take it as soon as you can. If it is almost time for your next dose, take only that dose.  Do not take double or extra doses. What may interact with this medicine? Do not take this medicine with the following medications:  dasabuvir; ombitasvir; paritaprevir; ritonavir  defibrotide  selexipag This medicine may also interact with the following medications:  certain medicines that treat or prevent blood clots like warfarin  narcotic medicines for pain  NSAIDs, medicines for pain and inflammation, like ibuprofen or  naproxen  repaglinide  SNRIs, medicines for depression, like desvenlafaxine, duloxetine, levomilnacipran, venlafaxine  SSRIs, medicines for depression, like citalopram, escitalopram, fluoxetine, fluvoxamine, paroxetine, sertraline  stomach acid blockers like cimetidine, esomeprazole, omeprazole This list may not describe all possible interactions. Give your health care provider a list of all the medicines, herbs, non-prescription drugs, or dietary supplements you use. Also tell them if you smoke, drink alcohol, or use illegal drugs. Some items may interact with your medicine. What should I watch for while using this medicine? Visit your doctor or health care professional for regular check-ups. Do not stop taking your medicine unless your doctor tells you to. Notify your doctor or health care professional and seek emergency treatment if you develop breathing problems; changes in vision; chest pain; severe, sudden headache; pain, swelling, warmth in the leg; trouble speaking; sudden numbness or weakness of the face, arm or leg. These can be signs that your condition has gotten worse. If you are going to have surgery or dental work, tell your doctor or health care professional that you are taking this medicine. Certain genetic factors may reduce the effect of this medicine. Your doctor may use genetic tests to determine treatment. Only take aspirin if you are instructed to. Low doses of aspirin are used with this medicine to treat some conditions. Taking aspirin with this medicine can increase your risk of bleeding so you must be careful. Talk to your doctor or pharmacist if you have questions. What side effects may I notice from receiving this medicine? Side effects that you should report to your doctor or health care professional as soon as possible:  allergic reactions like skin rash, itching or hives, swelling of the face, lips, or tongue  signs and symptoms of bleeding such as bloody or black,  tarry stools; red or dark-brown urine; spitting up blood or brown material that looks like coffee grounds; red spots on the skin; unusual bruising or bleeding from the eye, gums, or nose  signs and symptoms of a blood clot such as breathing problems; changes in vision; chest pain; severe, sudden headache; pain, swelling, warmth in the leg; trouble speaking; sudden numbness or weakness of the face, arm or leg  signs and symptoms of low blood sugar such as feeling anxious; confusion; dizziness; increased hunger; unusually weak or tired; increased sweating; shakiness; cold, clammy skin; irritable; headache; blurred vision; fast heartbeat; loss of consciousness Side effects that usually do not require medical attention (report to your doctor or health care professional if they continue or are bothersome):  constipation  diarrhea  headache  upset stomach This list may not describe all possible side effects. Call your doctor for medical advice about side effects. You may report side effects to FDA at 1-800-FDA-1088. Where should I keep my medicine? Keep out of the reach of children. Store at room temperature of 59 to 86 degrees F (15 to 30 degrees C). Throw away any unused medicine after the expiration date. NOTE: This sheet is a summary. It may not cover all possible information. If you have questions about this medicine, talk to your doctor, pharmacist, or  health care provider.  2020 Elsevier/Gold Standard (2017-07-14 15:03:38)

## 2019-09-14 LAB — LIPID PANEL
Chol/HDL Ratio: 2.9 ratio (ref 0.0–4.4)
Cholesterol, Total: 97 mg/dL — ABNORMAL LOW (ref 100–199)
HDL: 33 mg/dL — ABNORMAL LOW (ref >39)
LDL Chol Calc (NIH): 49 mg/dL (ref 0–99)
Triglycerides: 71 mg/dL (ref 0–149)
VLDL Cholesterol Cal: 15 mg/dL (ref 5–40)

## 2019-09-17 ENCOUNTER — Telehealth: Payer: Self-pay | Admitting: Cardiology

## 2019-09-17 NOTE — Telephone Encounter (Signed)
Melody Myers is returning Hayley's call in regards to her lab results.

## 2019-09-17 NOTE — Telephone Encounter (Signed)
Patient informed of results.  

## 2019-09-17 NOTE — Telephone Encounter (Signed)
Left message for patient to return call.

## 2019-09-22 ENCOUNTER — Other Ambulatory Visit: Payer: Self-pay | Admitting: Physician Assistant

## 2019-10-25 ENCOUNTER — Other Ambulatory Visit: Payer: Self-pay | Admitting: Physician Assistant

## 2019-12-04 ENCOUNTER — Other Ambulatory Visit: Payer: Self-pay

## 2019-12-04 ENCOUNTER — Emergency Department (HOSPITAL_COMMUNITY): Payer: Medicare Other

## 2019-12-04 ENCOUNTER — Emergency Department (HOSPITAL_COMMUNITY)
Admission: EM | Admit: 2019-12-04 | Discharge: 2019-12-04 | Disposition: A | Discharge status: Home / Self Care | Payer: Medicare Other | Attending: Emergency Medicine | Admitting: Emergency Medicine

## 2019-12-04 DIAGNOSIS — J449 Chronic obstructive pulmonary disease, unspecified: Secondary | ICD-10-CM | POA: Insufficient documentation

## 2019-12-04 DIAGNOSIS — R079 Chest pain, unspecified: Secondary | ICD-10-CM | POA: Diagnosis not present

## 2019-12-04 DIAGNOSIS — Z5321 Procedure and treatment not carried out due to patient leaving prior to being seen by health care provider: Secondary | ICD-10-CM | POA: Diagnosis not present

## 2019-12-04 DIAGNOSIS — Z7951 Long term (current) use of inhaled steroids: Secondary | ICD-10-CM | POA: Diagnosis not present

## 2019-12-04 LAB — CBC
HCT: 44 % (ref 36.0–46.0)
Hemoglobin: 14.2 g/dL (ref 12.0–15.0)
MCH: 29.8 pg (ref 26.0–34.0)
MCHC: 32.3 g/dL (ref 30.0–36.0)
MCV: 92.4 fL (ref 80.0–100.0)
Platelets: 242 10*3/uL (ref 150–400)
RBC: 4.76 MIL/uL (ref 3.87–5.11)
RDW: 14.2 % (ref 11.5–15.5)
WBC: 11.1 10*3/uL — ABNORMAL HIGH (ref 4.0–10.5)
nRBC: 0 % (ref 0.0–0.2)

## 2019-12-04 LAB — BASIC METABOLIC PANEL
Anion gap: 9 (ref 5–15)
BUN: <5 mg/dL — ABNORMAL LOW (ref 6–20)
CO2: 27 mmol/L (ref 22–32)
Calcium: 9.1 mg/dL (ref 8.9–10.3)
Chloride: 104 mmol/L (ref 98–111)
Creatinine, Ser: 0.71 mg/dL (ref 0.44–1.00)
GFR, Estimated: >60 mL/min (ref >60)
Glucose, Bld: 169 mg/dL — ABNORMAL HIGH (ref 70–99)
Potassium: 3.8 mmol/L (ref 3.5–5.1)
Sodium: 140 mmol/L (ref 135–145)

## 2019-12-04 LAB — TROPONIN I (HIGH SENSITIVITY): Troponin I (High Sensitivity): 3 ng/L (ref <18)

## 2019-12-04 LAB — I-STAT BETA HCG BLOOD, ED (MC, WL, AP ONLY): I-stat hCG, quantitative: <5 m[IU]/mL (ref <5)

## 2019-12-04 NOTE — ED Triage Notes (Signed)
Pt reports chest pain with radiation to L arm upon waking this morning. Hx COPD, used albuterol inhaler, which made her feel worse. 324 ASA taken prior to EMS arrival. [facility] evaluated [facility] earlier this [facility] for same and dx with possible bronchitis with rx called in, however the pharmacy said they did not receive the rx.

## 2019-12-04 NOTE — ED Notes (Signed)
Pt left due to wait time  

## 2020-01-13 ENCOUNTER — Other Ambulatory Visit: Payer: Self-pay

## 2020-01-13 DIAGNOSIS — F319 Bipolar disorder, unspecified: Secondary | ICD-10-CM | POA: Insufficient documentation

## 2020-01-13 DIAGNOSIS — R55 Syncope and collapse: Secondary | ICD-10-CM | POA: Insufficient documentation

## 2020-01-14 ENCOUNTER — Other Ambulatory Visit: Payer: Self-pay

## 2020-01-14 ENCOUNTER — Ambulatory Visit (INDEPENDENT_AMBULATORY_CARE_PROVIDER_SITE_OTHER): Payer: Medicare Other | Admitting: Cardiology

## 2020-01-14 ENCOUNTER — Encounter: Payer: Self-pay | Admitting: Cardiology

## 2020-01-14 VITALS — BP 100/70 | HR 92 | Ht 64.0 in | Wt 136.0 lb

## 2020-01-14 DIAGNOSIS — E785 Hyperlipidemia, unspecified: Secondary | ICD-10-CM

## 2020-01-14 DIAGNOSIS — Z720 Tobacco use: Secondary | ICD-10-CM | POA: Diagnosis not present

## 2020-01-14 DIAGNOSIS — R0609 Other forms of dyspnea: Secondary | ICD-10-CM

## 2020-01-14 DIAGNOSIS — I2510 Atherosclerotic heart disease of native coronary artery without angina pectoris: Secondary | ICD-10-CM | POA: Diagnosis not present

## 2020-01-14 DIAGNOSIS — R0600 Dyspnea, unspecified: Secondary | ICD-10-CM | POA: Diagnosis not present

## 2020-01-14 NOTE — Patient Instructions (Signed)
Medication Instructions:  Your physician recommends that you continue on your current medications as directed. Please refer to the Current Medication list given to you today.  *If you need a refill on your cardiac medications before your next appointment, please call your pharmacy*   Lab Work: None ordered   If you have labs (blood work) drawn today and your tests are completely normal, you will receive your results only by: MyChart Message (if you have MyChart) OR A paper copy in the mail If you have any lab test that is abnormal or we need to change your treatment, we will call you to review the results.   Testing/Procedures: Your physician has requested that you have an echocardiogram. Echocardiography is a painless test that uses sound waves to create images of your heart. It provides your doctor with information about the size and shape of your heart and how well your heart's chambers and valves are working. This procedure takes approximately one hour. There are no restrictions for this procedure.    Follow-Up: At [facility], you and your health needs are our priority.  As part of our continuing mission to provide you with exceptional heart care, we have created designated Provider Care Teams.  These Care Teams include your primary Cardiologist (physician) and Advanced Practice Providers (APPs -  Physician Assistants and Nurse Practitioners) who all work together to provide you with the care you need, when you need it.  We recommend signing up for the patient portal called "MyChart".  Sign up information is provided on this After Visit Summary.  MyChart is used to connect with patients for Virtual Visits (Telemedicine).  Patients are able to view lab/test results, encounter notes, upcoming appointments, etc.  Non-urgent messages can be sent to your provider as well.   To learn more about what you can do with MyChart, go to [email or web address].    Your next appointment:   5  month(s)  The format for your next appointment:   In Person  Provider:   Robert Krasowski, MD   Other Instructions None   

## 2020-01-14 NOTE — Progress Notes (Signed)
Cardiology Office Note:    Date:  01/14/2020   ID:  [name], DOB 1980/06/05, MRN [id number]  PCP:  Laurel Dimmer, FNP  Cardiologist:  Gypsy Balsam, MD    Referring MD: Selena Batten *   Chief Complaint  Patient presents with  . Follow-up  I was in the hospital  History of Present Illness:    Melody Myers is a 39 y.o. female with past medical history significant for PTCA and stenting of the obtuse marginal branch done in August 2020, after that she did have a stress test which showed some small defect involving apex.  During the cardiac catheterization she was found to have some moderate disease in different vessels.  Her past medical history is complicated by diabetes dyslipidemia and continued smoking. She comes today 2 months for follow-up just few days ago she ended up going to the hospital the reason for visit in the hospital with a 5 that she was thinking she was having congestive heart failure.  She has been having shortness of breath for months and lately become worse on top of that she was complaint having chest pain continuous pain for a month pain was worse with pressing chest wall.  I did review record from the emergency room.  Troponin I were normal EKG did not show any acute changes she was told to have anxiety she was given diazepam as well as BuSpar.  She said since she started taking this medication she feels like a new person.  She is scheduled to see psychiatrist.  She does have a psychiatrist that she sees on the regular basis for her panic disorder as well as anxiety disorder.  Denies have any chest pain tightness squeezing pressure burning chest, 72 still continues to smoke.  Past Medical History:  Diagnosis Date  . Anxiety   . Asthma   . Bipolar 1 disorder (HCC)   . Bipolar depression (HCC) 03/20/2013  . Carpal tunnel syndrome 02/04/2013   Formatting of this note might be different from the original. IMPRESSION: Order right CTI 40981-  ASAP  . Chest pain 12/24/2018  . Chronic pain 03/20/2013  . 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 Repaired at [facility] in Sept 2016.  Pt states that all problems are still present.  If followed by [facility] for this. She has f/u appt.  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  . 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 10/14/2018  . Degenerative disc disease, lumbar 06/05/2015   Formatting of this note might be different from the original. L4-5 > L3-4, mild  . Depression    patient reports bipolor  . Depression   . Diabetic neuropathy (HCC)   . DKA (diabetic ketoacidoses) 03/19/2013  . History of adenomatous polyp of colon 04/17/2015   Formatting of this note might be different from the original. Per path 2016  . Hyperlipidemia LDL goal <70 10/04/2018  . Hypokalemia 11/15/2014  . Ingrown toenail of both feet 04/12/2019   Formatting of this note might be different from the original. great toenails bilateral  . Insulin dependent diabetes mellitus with complications    insulin dependant  . Leukocytosis 03/20/2013  . Neuropathy   . Non-ST elevation (NSTEMI) myocardial infarction (HCC) 10/02/2018  . NSTEMI (non-ST elevated myocardial infarction) (HCC) 10/02/2018  . Superficial incisional surgical site infection 11/14/2014  . Syncopal episodes   . Tobacco abuse 03/20/2013  . Urinary retention 11/14/2014  Past Surgical History:  Procedure Laterality Date  . CORONARY STENT INTERVENTION N/A 10/02/2018   Procedure: CORONARY STENT INTERVENTION;  Surgeon: Yvonne Kendall, MD;  Location: [facility] INVASIVE CV LAB;  Service: Cardiovascular;  Laterality: N/A;  . LEFT HEART CATH AND CORONARY ANGIOGRAPHY N/A 10/02/2018   Procedure: LEFT HEART CATH AND CORONARY ANGIOGRAPHY;  Surgeon: Yvonne Kendall, MD;  Location: [facility] INVASIVE CV LAB;  Service: Cardiovascular;  Laterality: N/A;  . TUBAL LIGATION    . VAGINA  RECONSTRUCTION SURGERY  10/2014   & ana, reconstruction    Current Medications: No outpatient medications have been marked as taking for the 01/14/20 encounter (Office Visit) with Georgeanna Lea, MD.     Allergies:   Amoxicillin, Latex, Penicillins, and Prednisone   Social History   Socioeconomic History  . Marital status: Single    Spouse name: Not on file  . Number of children: Not on file  . Years of education: Not on file  . Highest education level: Not on file  Occupational History  . Not on file  Tobacco Use  . Smoking status: Current Every Day Smoker    Packs/day: 0.25    Types: Cigarettes  . Smokeless tobacco: Never Used  Vaping Use  . Vaping Use: Never used  Substance and Sexual Activity  . Alcohol use: Yes    Comment: occasional  . Drug use: No  . Sexual activity: Yes    Comment: tubal ligation  Other Topics Concern  . Not on file  Social History Narrative  . Not on file   Social Determinants of Health   Financial Resource Strain:   . Difficulty of Paying Living Expenses: Not on file  Food Insecurity:   . Worried About Programme researcher, broadcasting/film/video in the Last Year: Not on file  . Ran Out of Food in the Last Year: Not on file  Transportation Needs:   . Lack of Transportation (Medical): Not on file  . Lack of Transportation (Non-Medical): Not on file  Physical Activity:   . Days of Exercise per Week: Not on file  . Minutes of Exercise per Session: Not on file  Stress:   . Feeling of Stress : Not on file  Social Connections:   . Frequency of Communication with Friends and Family: Not on file  . Frequency of Social Gatherings with Friends and Family: Not on file  . Attends Religious Services: Not on file  . Active Member of Clubs or Organizations: Not on file  . Attends Banker Meetings: Not on file  . Marital Status: Not on file     Family History: The patient's family history includes Heart attack (age of onset: 49) in her brother; Heart  attack (age of onset: 73) in her father. ROS:   Please see the history of present illness.    All 14 point review of systems negative except as described per history of present illness  EKGs/Labs/Other Studies Reviewed:    Cardiac catheterization from 10/02/2018 showed: Conclusions: 1. 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 2. Moderate LAD, proximal LCx, and RCA disease. 3. Mildly elevated LVEDP. 4. Normal LVEF. 5. Successful PCI to OM1 using Resolute Onyx 2.25 x 18 mm drug-eluting stent with 0% residual stenosis and TIMI-3 flow.  Recommendations: 1. Dual antiplatelet therapy with aspirin and ticagrelor for at least 12 months. 2. Aggressive secondary prevention.      Recent Labs: 12/04/2019: BUN <5; Creatinine, Ser 0.71; Hemoglobin 14.2; Platelets 242; Potassium 3.8; Sodium 140  Recent Lipid Panel    Component Value Date/Time   CHOL 97 (L) 09/13/2019 1204   TRIG 71 09/13/2019 1204   HDL 33 (L) 09/13/2019 1204   CHOLHDL 2.9 09/13/2019 1204   LDLCALC 49 09/13/2019 1204    Physical Exam:    VS:  Ht 5\' 4"  (1.626 m)   Wt 136 lb (61.7 kg)   BMI 23.34 kg/m     Wt Readings from Last 3 Encounters:  01/14/20 136 lb (61.7 kg)  12/04/19 135 lb (61.2 kg)  09/13/19 140 lb (63.5 kg)     GEN:  Well nourished, well developed in no acute distress HEENT: Normal NECK: No JVD; No carotid bruits LYMPHATICS: No lymphadenopathy CARDIAC: RRR, no murmurs, no rubs, no gallops RESPIRATORY:  Clear to auscultation without rales, wheezing or rhonchi  ABDOMEN: Soft, non-tender, non-distended MUSCULOSKELETAL:  No edema; No deformity  SKIN: Warm and dry LOWER EXTREMITIES: no swelling NEUROLOGIC:  Alert and oriented x 3 PSYCHIATRIC:  Normal affect   ASSESSMENT:    1. Coronary artery disease involving native coronary artery of native heart without angina pectoris   2. Tobacco abuse   3. Hyperlipidemia LDL goal <70    PLAN:    In order of problems listed  above:  1.Coronary disease status post PTCA and stenting of obtuse marginal branch more than a year ago.  On antiplatelet therapy which I will continue.  Denies having any symptoms since she was discharged from the emergency room and put on anxiolytics. 2.Tobacco abuse obviously big problem.  And she understand this.  We discussed details how she can quit smoking and she is determined to do that. Diabetes mellitus: She said that her diabetes was poorly controlled before she end up going to the hospital and her anxiety being better managed. 4.  Dyslipidemia I did review K PN which show me her LDL 49 HDL of 33 this is from 09/13/2019.   Overall it looks like the leading problem right now is anxiety and panic disorder.  She is being followed by psychiatrist which I encouraged her to.  However now with new medication she is very happy and she feels good.   Medication Adjustments/Labs and Tests Ordered: Current medicines are reviewed at length with the patient today.  Concerns regarding medicines are outlined above.  No orders of the defined types were placed in this encounter.  Medication changes: No orders of the defined types were placed in this encounter.   Signed, 09/15/2019, MD, [facility] 01/14/2020 2:54 PM    [facility]

## 2020-01-30 ENCOUNTER — Other Ambulatory Visit: Payer: Self-pay | Admitting: Cardiology

## 2020-02-09 ENCOUNTER — Emergency Department (HOSPITAL_COMMUNITY): Payer: No Typology Code available for payment source

## 2020-02-09 ENCOUNTER — Emergency Department (HOSPITAL_COMMUNITY)
Admission: EM | Admit: 2020-02-09 | Discharge: 2020-02-10 | Disposition: A | Discharge status: Home / Self Care | Payer: No Typology Code available for payment source | Attending: Emergency Medicine | Admitting: Emergency Medicine

## 2020-02-09 DIAGNOSIS — Z7982 Long term (current) use of aspirin: Secondary | ICD-10-CM | POA: Diagnosis not present

## 2020-02-09 DIAGNOSIS — Z7902 Long term (current) use of antithrombotics/antiplatelets: Secondary | ICD-10-CM | POA: Insufficient documentation

## 2020-02-09 DIAGNOSIS — S7001XA Contusion of right hip, initial encounter: Secondary | ICD-10-CM | POA: Insufficient documentation

## 2020-02-09 DIAGNOSIS — Z794 Long term (current) use of insulin: Secondary | ICD-10-CM | POA: Insufficient documentation

## 2020-02-09 DIAGNOSIS — R000 Tachycardia, unspecified: Secondary | ICD-10-CM | POA: Diagnosis not present

## 2020-02-09 DIAGNOSIS — I2510 Atherosclerotic heart disease of native coronary artery without angina pectoris: Secondary | ICD-10-CM | POA: Diagnosis not present

## 2020-02-09 DIAGNOSIS — Z91040 Latex allergy status: Secondary | ICD-10-CM | POA: Diagnosis not present

## 2020-02-09 DIAGNOSIS — E1140 Type 2 diabetes mellitus with diabetic neuropathy, unspecified: Secondary | ICD-10-CM | POA: Diagnosis not present

## 2020-02-09 DIAGNOSIS — Y92410 Unspecified street and highway as the place of occurrence of the external cause: Secondary | ICD-10-CM | POA: Insufficient documentation

## 2020-02-09 DIAGNOSIS — J45909 Unspecified asthma, uncomplicated: Secondary | ICD-10-CM | POA: Insufficient documentation

## 2020-02-09 DIAGNOSIS — R079 Chest pain, unspecified: Secondary | ICD-10-CM | POA: Diagnosis not present

## 2020-02-09 DIAGNOSIS — Z9861 Coronary angioplasty status: Secondary | ICD-10-CM | POA: Insufficient documentation

## 2020-02-09 DIAGNOSIS — F17210 Nicotine dependence, cigarettes, uncomplicated: Secondary | ICD-10-CM | POA: Insufficient documentation

## 2020-02-09 DIAGNOSIS — Z79899 Other long term (current) drug therapy: Secondary | ICD-10-CM | POA: Insufficient documentation

## 2020-02-09 DIAGNOSIS — S301XXA Contusion of abdominal wall, initial encounter: Secondary | ICD-10-CM | POA: Insufficient documentation

## 2020-02-09 DIAGNOSIS — S3991XA Unspecified injury of abdomen, initial encounter: Secondary | ICD-10-CM | POA: Diagnosis present

## 2020-02-09 LAB — CBC WITH DIFFERENTIAL/PLATELET
Abs Immature Granulocytes: 0.05 10*3/uL (ref 0.00–0.07)
Basophils Absolute: 0.1 10*3/uL (ref 0.0–0.1)
Basophils Relative: 1 %
Eosinophils Absolute: 0.5 10*3/uL (ref 0.0–0.5)
Eosinophils Relative: 5 %
HCT: 40.7 % (ref 36.0–46.0)
Hemoglobin: 13.4 g/dL (ref 12.0–15.0)
Immature Granulocytes: 1 %
Lymphocytes Relative: 27 %
Lymphs Abs: 2.7 10*3/uL (ref 0.7–4.0)
MCH: 29.6 pg (ref 26.0–34.0)
MCHC: 32.9 g/dL (ref 30.0–36.0)
MCV: 90 fL (ref 80.0–100.0)
Monocytes Absolute: 0.6 10*3/uL (ref 0.1–1.0)
Monocytes Relative: 6 %
Neutro Abs: 5.9 10*3/uL (ref 1.7–7.7)
Neutrophils Relative %: 60 %
Platelets: 210 10*3/uL (ref 150–400)
RBC: 4.52 MIL/uL (ref 3.87–5.11)
RDW: 12.9 % (ref 11.5–15.5)
WBC: 9.8 10*3/uL (ref 4.0–10.5)
nRBC: 0 % (ref 0.0–0.2)

## 2020-02-09 LAB — COOXEMETRY PANEL
Carboxyhemoglobin: 8.3 % (ref 0.5–1.5)
Methemoglobin: 0.9 % (ref 0.0–1.5)
O2 Saturation: 75.2 %
Total hemoglobin: 13.6 g/dL (ref 12.0–16.0)

## 2020-02-09 LAB — COMPREHENSIVE METABOLIC PANEL
ALT: 15 U/L (ref 0–44)
AST: 17 U/L (ref 15–41)
Albumin: 3.5 g/dL (ref 3.5–5.0)
Alkaline Phosphatase: 67 U/L (ref 38–126)
Anion gap: 10 (ref 5–15)
BUN: 6 mg/dL (ref 6–20)
CO2: 23 mmol/L (ref 22–32)
Calcium: 9 mg/dL (ref 8.9–10.3)
Chloride: 105 mmol/L (ref 98–111)
Creatinine, Ser: 0.75 mg/dL (ref 0.44–1.00)
GFR, Estimated: >60 mL/min (ref >60)
Glucose, Bld: 117 mg/dL — ABNORMAL HIGH (ref 70–99)
Potassium: 3.4 mmol/L — ABNORMAL LOW (ref 3.5–5.1)
Sodium: 138 mmol/L (ref 135–145)
Total Bilirubin: 0.7 mg/dL (ref 0.3–1.2)
Total Protein: 6.2 g/dL — ABNORMAL LOW (ref 6.5–8.1)

## 2020-02-09 LAB — TROPONIN I (HIGH SENSITIVITY): Troponin I (High Sensitivity): 3 ng/L (ref <18)

## 2020-02-09 LAB — I-STAT BETA HCG BLOOD, ED (MC, WL, AP ONLY): I-stat hCG, quantitative: <5 m[IU]/mL (ref <5)

## 2020-02-09 LAB — LIPASE, BLOOD: Lipase: 18 U/L (ref 11–51)

## 2020-02-09 LAB — CBG MONITORING, ED: Glucose-Capillary: 88 mg/dL (ref 70–99)

## 2020-02-09 LAB — ETHANOL: Alcohol, Ethyl (B): <10 mg/dL (ref <10)

## 2020-02-09 MED ORDER — LORAZEPAM 2 MG/ML IJ SOLN
1.0000 mg | Freq: Once | INTRAMUSCULAR | Status: AC
  Administered 2020-02-09: 1 mg via INTRAVENOUS
  Filled 2020-02-09: qty 1

## 2020-02-09 MED ORDER — ACETAMINOPHEN 325 MG PO TABS
650.0000 mg | ORAL_TABLET | Freq: Once | ORAL | Status: AC
  Administered 2020-02-09: 650 mg via ORAL
  Filled 2020-02-09: qty 2

## 2020-02-09 MED ORDER — IOHEXOL 300 MG/ML  SOLN
100.0000 mL | Freq: Once | INTRAMUSCULAR | Status: AC | PRN
  Administered 2020-02-09: 100 mL via INTRAVENOUS

## 2020-02-09 MED ORDER — CYCLOBENZAPRINE HCL 10 MG PO TABS: 5.0000 mg | ORAL_TABLET | Freq: Three times a day (TID) | ORAL | 0 refills | Status: DC | PRN

## 2020-02-09 MED ORDER — ONDANSETRON HCL 4 MG/2ML IJ SOLN
4.0000 mg | Freq: Once | INTRAMUSCULAR | Status: AC
  Administered 2020-02-09: 4 mg via INTRAVENOUS
  Filled 2020-02-09: qty 2
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
## 2020-02-09 NOTE — Discharge Instructions (Addendum)
You had a CT scan performed in the emergency department today. The CT scan showed changes to your lungs called emphysema, which can be caused from smoking. Continue your efforts to stop smoking. Your CT scan also showed changes to your vessels called atherosclerosis. Continue your current medications and follow-up with your family doctor for further treatment.
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
## 2020-02-10 ENCOUNTER — Ambulatory Visit: Payer: Medicare Other | Admitting: Cardiology

## 2020-02-11 ENCOUNTER — Other Ambulatory Visit: Payer: Self-pay

## 2020-02-11 ENCOUNTER — Ambulatory Visit (INDEPENDENT_AMBULATORY_CARE_PROVIDER_SITE_OTHER): Payer: Medicare Other

## 2020-02-11 DIAGNOSIS — R0600 Dyspnea, unspecified: Secondary | ICD-10-CM | POA: Diagnosis not present

## 2020-02-11 DIAGNOSIS — R0609 Other forms of dyspnea: Secondary | ICD-10-CM

## 2020-02-11 LAB — ECHOCARDIOGRAM COMPLETE
Area-P 1/2: 4.68 cm2
S' Lateral: 2.8 cm

## 2020-02-11 NOTE — Progress Notes (Signed)
Complete echocardiogram performed.  Jimmy Necola Bluestein RDCS, RVT  

## 2020-02-24 DIAGNOSIS — R6884 Jaw pain: Secondary | ICD-10-CM | POA: Insufficient documentation

## 2020-02-24 HISTORY — DX: Jaw pain: R68.84

## 2020-04-25 LAB — HM DIABETES EYE EXAM

## 2020-04-29 ENCOUNTER — Other Ambulatory Visit: Payer: Self-pay

## 2020-04-29 ENCOUNTER — Emergency Department (HOSPITAL_COMMUNITY): Payer: Medicare Other

## 2020-04-29 ENCOUNTER — Encounter (HOSPITAL_COMMUNITY): Payer: Self-pay

## 2020-04-29 ENCOUNTER — Emergency Department (HOSPITAL_COMMUNITY)
Admission: EM | Admit: 2020-04-29 | Discharge: 2020-04-29 | Disposition: A | Discharge status: Home / Self Care | Payer: Medicare Other | Attending: Emergency Medicine | Admitting: Emergency Medicine

## 2020-04-29 DIAGNOSIS — Z91040 Latex allergy status: Secondary | ICD-10-CM | POA: Diagnosis not present

## 2020-04-29 DIAGNOSIS — R000 Tachycardia, unspecified: Secondary | ICD-10-CM | POA: Diagnosis not present

## 2020-04-29 DIAGNOSIS — Z951 Presence of aortocoronary bypass graft: Secondary | ICD-10-CM | POA: Diagnosis not present

## 2020-04-29 DIAGNOSIS — E1140 Type 2 diabetes mellitus with diabetic neuropathy, unspecified: Secondary | ICD-10-CM | POA: Insufficient documentation

## 2020-04-29 DIAGNOSIS — Z7982 Long term (current) use of aspirin: Secondary | ICD-10-CM | POA: Insufficient documentation

## 2020-04-29 DIAGNOSIS — R52 Pain, unspecified: Secondary | ICD-10-CM

## 2020-04-29 DIAGNOSIS — Z7951 Long term (current) use of inhaled steroids: Secondary | ICD-10-CM | POA: Insufficient documentation

## 2020-04-29 DIAGNOSIS — J45909 Unspecified asthma, uncomplicated: Secondary | ICD-10-CM | POA: Insufficient documentation

## 2020-04-29 DIAGNOSIS — E1165 Type 2 diabetes mellitus with hyperglycemia: Secondary | ICD-10-CM | POA: Insufficient documentation

## 2020-04-29 DIAGNOSIS — U071 COVID-19: Secondary | ICD-10-CM | POA: Diagnosis not present

## 2020-04-29 DIAGNOSIS — I2510 Atherosclerotic heart disease of native coronary artery without angina pectoris: Secondary | ICD-10-CM | POA: Diagnosis not present

## 2020-04-29 DIAGNOSIS — Z794 Long term (current) use of insulin: Secondary | ICD-10-CM | POA: Diagnosis not present

## 2020-04-29 DIAGNOSIS — Z7902 Long term (current) use of antithrombotics/antiplatelets: Secondary | ICD-10-CM | POA: Insufficient documentation

## 2020-04-29 DIAGNOSIS — R739 Hyperglycemia, unspecified: Secondary | ICD-10-CM

## 2020-04-29 DIAGNOSIS — F17210 Nicotine dependence, cigarettes, uncomplicated: Secondary | ICD-10-CM | POA: Diagnosis not present

## 2020-04-29 DIAGNOSIS — R0602 Shortness of breath: Secondary | ICD-10-CM | POA: Diagnosis present

## 2020-04-29 LAB — URINALYSIS, ROUTINE W REFLEX MICROSCOPIC
Bilirubin Urine: NEGATIVE
Glucose, UA: >=500 mg/dL — AB
Hgb urine dipstick: NEGATIVE
Ketones, ur: 5 mg/dL — AB
Leukocytes,Ua: NEGATIVE
Nitrite: NEGATIVE
Protein, ur: NEGATIVE mg/dL
Specific Gravity, Urine: 1.031 — ABNORMAL HIGH (ref 1.005–1.030)
pH: 5 (ref 5.0–8.0)

## 2020-04-29 LAB — CBC
HCT: 43.9 % (ref 36.0–46.0)
Hemoglobin: 14.3 g/dL (ref 12.0–15.0)
MCH: 29.5 pg (ref 26.0–34.0)
MCHC: 32.6 g/dL (ref 30.0–36.0)
MCV: 90.5 fL (ref 80.0–100.0)
Platelets: 207 10*3/uL (ref 150–400)
RBC: 4.85 MIL/uL (ref 3.87–5.11)
RDW: 13.2 % (ref 11.5–15.5)
WBC: 9.4 10*3/uL (ref 4.0–10.5)
nRBC: 0 % (ref 0.0–0.2)

## 2020-04-29 LAB — D-DIMER, QUANTITATIVE: D-Dimer, Quant: <0.27 ug/mL-FEU (ref 0.00–0.50)

## 2020-04-29 LAB — BASIC METABOLIC PANEL
Anion gap: 14 (ref 5–15)
Anion gap: 10 (ref 5–15)
BUN: 10 mg/dL (ref 6–20)
BUN: 11 mg/dL (ref 6–20)
CO2: 18 mmol/L — ABNORMAL LOW (ref 22–32)
CO2: 20 mmol/L — ABNORMAL LOW (ref 22–32)
Calcium: 8.8 mg/dL — ABNORMAL LOW (ref 8.9–10.3)
Calcium: 8.4 mg/dL — ABNORMAL LOW (ref 8.9–10.3)
Chloride: 100 mmol/L (ref 98–111)
Chloride: 104 mmol/L (ref 98–111)
Creatinine, Ser: 0.82 mg/dL (ref 0.44–1.00)
Creatinine, Ser: 0.73 mg/dL (ref 0.44–1.00)
GFR, Estimated: >60 mL/min (ref >60)
GFR, Estimated: >60 mL/min (ref >60)
Glucose, Bld: 437 mg/dL — ABNORMAL HIGH (ref 70–99)
Glucose, Bld: 346 mg/dL — ABNORMAL HIGH (ref 70–99)
Potassium: 3.9 mmol/L (ref 3.5–5.1)
Potassium: 3.6 mmol/L (ref 3.5–5.1)
Sodium: 132 mmol/L — ABNORMAL LOW (ref 135–145)
Sodium: 134 mmol/L — ABNORMAL LOW (ref 135–145)

## 2020-04-29 LAB — I-STAT BETA HCG BLOOD, ED (MC, WL, AP ONLY): I-stat hCG, quantitative: <5 m[IU]/mL (ref <5)

## 2020-04-29 LAB — HEPATIC FUNCTION PANEL
ALT: 17 U/L (ref 0–44)
AST: 21 U/L (ref 15–41)
Albumin: 3.4 g/dL — ABNORMAL LOW (ref 3.5–5.0)
Alkaline Phosphatase: 54 U/L (ref 38–126)
Bilirubin, Direct: 0.1 mg/dL (ref 0.0–0.2)
Indirect Bilirubin: 0.5 mg/dL (ref 0.3–0.9)
Total Bilirubin: 0.6 mg/dL (ref 0.3–1.2)
Total Protein: 5.9 g/dL — ABNORMAL LOW (ref 6.5–8.1)

## 2020-04-29 LAB — CBG MONITORING, ED
Glucose-Capillary: 476 mg/dL — ABNORMAL HIGH (ref 70–99)
Glucose-Capillary: 354 mg/dL — ABNORMAL HIGH (ref 70–99)

## 2020-04-29 LAB — TROPONIN I (HIGH SENSITIVITY)
Troponin I (High Sensitivity): <2 ng/L (ref <18)
Troponin I (High Sensitivity): 3 ng/L (ref <18)

## 2020-04-29 LAB — LIPASE, BLOOD: Lipase: 24 U/L (ref 11–51)

## 2020-04-29 MED ORDER — INSULIN GLARGINE 100 UNIT/ML ~~LOC~~ SOLN
32.0000 [IU] | Freq: Once | SUBCUTANEOUS | Status: AC
  Administered 2020-04-29: 32 [IU] via SUBCUTANEOUS
  Filled 2020-04-29: qty 0.32

## 2020-04-29 MED ORDER — INSULIN ASPART 100 UNIT/ML ~~LOC~~ SOLN
5.0000 [IU] | Freq: Once | SUBCUTANEOUS | Status: AC
  Administered 2020-04-29: 5 [IU] via INTRAVENOUS

## 2020-04-29 MED ORDER — ONDANSETRON HCL 4 MG/2ML IJ SOLN
4.0000 mg | Freq: Once | INTRAMUSCULAR | Status: AC
  Administered 2020-04-29: 4 mg via INTRAVENOUS
  Filled 2020-04-29: qty 2

## 2020-04-29 MED ORDER — LACTATED RINGERS IV BOLUS
2000.0000 mL | Freq: Once | INTRAVENOUS | Status: AC
  Administered 2020-04-29: 2000 mL via INTRAVENOUS

## 2020-04-29 NOTE — ED Triage Notes (Signed)
EMS reports pt is from home. Tested positive for covid today at an urgent care in [place], [place]. Had an episode of syncope x 1 prior to EMS arrival. C/O chest pain, sob, and weakness. Given Nitro x 3, ASA 324mg  PO, Albuterol x 5. Pt reports that Urgent Care gave her methylprednisolone and she has had a reaction to prednisone in the past that caused itching, swelling, and hives. This am c/o itching all over since starting medication.

## 2020-04-29 NOTE — ED Provider Notes (Addendum)
[facility] EMERGENCY DEPARTMENT Provider Note   CSN: [id number] Arrival date & time: 04/29/20  0076     History Chief Complaint  Patient presents with  . Chest Pain  . Shortness of Breath    Covid +    Melody Myers is a 40 y.o. female.  40yo F w/ PMH including CAD s/p stenting, IDDM, chronic pain, bipolar d/o, HLD who p/w COVID, CP, and SOB. 2 days ago, pt began having COVID symptoms including nausea, malaise, body aches, cough, nasal congestion, and diarrhea. She tested positive for COVID earlier today. She is vaccinated. She states she has had intermittent central chest soreness and intermittent SOB since her illness symptoms began 2 days ago. CP and SOB are random and nothing brings them on or makes sx better. She states her CP does not at all feel like the CP she had associated w/ NSTEMI/stenting. When she was seen at UC earlier today, she was started on methylprednisolone; seh thinks it has made her have diffuse itching. She reports an episode of passing out tonight. EMS was called and gave her NTG, ASA, and albuterol prior to arrival. She denies fevers. She reports polyuria and polydipsia. She has been compliant w/ medications including insulin.  The history is provided by the patient.  Chest Pain Associated symptoms: shortness of breath   Shortness of Breath Associated symptoms: chest pain        Past Medical History:  Diagnosis Date  . Anxiety   . Asthma   . Bipolar 1 disorder (HCC)   . Bipolar depression (HCC) 03/20/2013  . Carpal tunnel syndrome 02/04/2013   Formatting of this note might be different from the original. IMPRESSION: Order right CTI 22633- ASAP  . Chest pain 12/24/2018  . Chronic pain 03/20/2013  . 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 Repaired at [facility] in Sept 2016.  Pt states that all problems are still present.  If followed by [facility] for this. She has f/u appt.  Wound healing limited  due to DM-type1 and smoking.  . 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 10/14/2018  . Degenerative disc disease, lumbar 06/05/2015   Formatting of this note might be different from the original. L4-5 > L3-4, mild  . Depression    patient reports bipolor  . Depression   . Diabetic neuropathy (HCC)   . DKA (diabetic ketoacidoses) 03/19/2013  . History of adenomatous polyp of colon 04/17/2015   Formatting of this note might be different from the original. Per path 2016  . Hyperlipidemia LDL goal <70 10/04/2018  . Hypokalemia 11/15/2014  . Ingrown toenail of both feet 04/12/2019   Formatting of this note might be different from the original. great toenails bilateral  . Insulin dependent diabetes mellitus with complications    insulin dependant  . Leukocytosis 03/20/2013  . Neuropathy   . Non-ST elevation (NSTEMI) myocardial infarction (HCC) 10/02/2018  . NSTEMI (non-ST elevated myocardial infarction) (HCC) 10/02/2018  . Superficial incisional surgical site infection 11/14/2014  . Syncopal episodes   . Tobacco abuse 03/20/2013  . Urinary retention 11/14/2014    Patient Active Problem List   Diagnosis Date Noted  . Bipolar 1 disorder (HCC)   . Syncopal episodes   . Ingrown toenail of both feet 04/12/2019  . Chest pain 12/24/2018  . 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 10/14/2018  . Hyperlipidemia LDL goal <70 10/04/2018  .  Non-ST elevation (NSTEMI) myocardial infarction (HCC) 10/02/2018  . NSTEMI (non-ST elevated myocardial infarction) (HCC) 10/02/2018  . Degenerative disc disease, lumbar 06/05/2015  . History of adenomatous polyp of colon 04/17/2015  . Hypokalemia 11/15/2014  . Urinary retention 11/14/2014  . Superficial incisional surgical site infection 11/14/2014  . 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 Leukocytosis 03/20/2013  . Bipolar depression (HCC) 03/20/2013  . Chronic pain 03/20/2013   . Tobacco abuse 03/20/2013  . Diabetic neuropathy (HCC)   . DKA (diabetic ketoacidoses) 03/19/2013  . Asthma   . Anxiety   . Depression   . Neuropathy   . Carpal tunnel syndrome 02/04/2013    Past Surgical History:  Procedure Laterality Date  . CORONARY STENT INTERVENTION N/A 10/02/2018   Procedure: CORONARY STENT INTERVENTION;  Surgeon: Yvonne KendallEnd, Christopher, MD;  Location: [facility] INVASIVE CV LAB;  Service: Cardiovascular;  Laterality: N/A;  . LEFT HEART CATH AND CORONARY ANGIOGRAPHY N/A 10/02/2018   Procedure: LEFT HEART CATH AND CORONARY ANGIOGRAPHY;  Surgeon: Yvonne KendallEnd, Christopher, MD;  Location: [facility] INVASIVE CV LAB;  Service: Cardiovascular;  Laterality: N/A;  . TUBAL LIGATION    . VAGINA RECONSTRUCTION SURGERY  10/2014   & ana, reconstruction     OB History   No obstetric history on file.     Family History  Problem Relation Age of Onset  . Heart attack Father 6057  . Heart attack Brother 5429    Social History   Tobacco Use  . Smoking status: Current Every Day Smoker    Packs/day: 0.25    Types: Cigarettes  . Smokeless tobacco: Never Used  Vaping Use  . Vaping Use: Never used  Substance Use Topics  . Alcohol use: Yes    Comment: occasional  . Drug use: No    Home Medications Prior to Admission medications   Medication Sig Start Date End Date Taking? Authorizing Provider  acetaminophen (TYLENOL) 325 MG tablet Take 650 mg by mouth every 6 (six) hours as needed for moderate pain or headache.    Yes [provider]  albuterol (PROVENTIL) (2.5 MG/3ML) 0.083% nebulizer solution Take by nebulization as directed. 11/08/19  Yes [provider]  aspirin EC 81 MG EC tablet Take 1 tablet (81 mg total) by mouth daily. 10/05/18  Yes Azalee CourseMeng, Hao, PA  atorvastatin (LIPITOR) 80 MG tablet Take 1 tablet (80 mg total) by mouth daily at 6 PM. 10/04/18  Yes Meng, [place] Lake[place]ao, PA  BREO ELLIPTA 100-25 MCG/INH AEPB Inhale 1 puff into the lungs daily. 03/27/20  Yes [provider]   clopidogrel (PLAVIX) 75 MG tablet Take 1 tablet (75 mg total) by mouth daily. 09/13/19  Yes Georgeanna LeaKrasowski, Robert J, MD  clotrimazole (LOTRIMIN) 1 % cream Apply 1 application topically 2 (two) times daily as needed. Rash 09/16/18  Yes [provider]  cyclobenzaprine (FLEXERIL) 10 MG tablet Take 0.5 tablets (5 mg total) by mouth 3 (three) times daily as needed for muscle spasms. 02/09/20  Yes Tilden Fossaees, Elizabeth, MD  doxycycline (VIBRAMYCIN) 100 MG capsule Take 100 mg by mouth as needed (spot on her Groin).  09/16/18  Yes [provider]  escitalopram (LEXAPRO) 10 MG tablet Take by mouth. 04/25/20  Yes [provider]  famotidine (PEPCID) 40 MG tablet Take 40 mg by mouth 2 (two) times daily. 10/06/19  Yes [provider]  fluticasone (FLONASE) 50 MCG/ACT nasal spray Place into both nostrils. 04/28/20  Yes [provider]  Fluticasone-Salmeterol (ADVAIR) 250-50 MCG/DOSE AEPB Inhale 1 puff into the lungs 2 (two)  times daily. 04/10/18  Yes [provider]  gabapentin (NEURONTIN) 300 MG capsule Take 300-600 capsules by mouth as needed (pain). 04/19/19  Yes [provider]  GLUCAGON EMERGENCY 1 MG injection Inject 1 mg as directed once. 05/28/18  Yes [provider]  insulin aspart (NOVOLOG) 100 UNIT/ML injection Inject 10-15 Units into the skin 4 (four) times daily. Sliding scale   Yes [provider]  Insulin Glargine (BASAGLAR KWIKPEN) 100 UNIT/ML SOPN Inject 32 Units into the skin daily.   Yes [provider]  ipratropium-albuterol (DUONEB) 0.5-2.5 (3) MG/3ML SOLN Take 3 mLs by nebulization 2 (two) times daily.    Yes [provider]  lithium carbonate 300 MG capsule Take 300 mg by mouth at bedtime. 04/24/20  Yes [provider]  loperamide (IMODIUM) 2 MG capsule Take 2 mg by mouth as needed for diarrhea or loose stools.   Yes [provider]  metoprolol tartrate (LOPRESSOR) 25 MG tablet TAKE 1 TABLET BY  MOUTH TWICE A DAY Patient taking differently: Take 25 mg by mouth 2 (two) times daily. 09/22/19  Yes Georgeanna Lea, MD  montelukast (SINGULAIR) 10 MG tablet Take 10 mg by mouth at bedtime. 02/14/20  Yes [provider]  Multiple Vitamin (MULTIVITAMIN WITH MINERALS) TABS Take 1 tablet by mouth daily.   Yes [provider]  nitroGLYCERIN (NITROSTAT) 0.4 MG SL tablet PLACE 1 TABLET UNDER THE TONGUE EVERY 5 MINUTES X 3 DOSES AS NEEDED FOR CHEST PAIN. MAX3TAB/15MIN Patient taking differently: Place 0.4 mg under the tongue every 5 (five) minutes as needed for chest pain. 01/31/20  Yes Georgeanna Lea, MD  ondansetron (ZOFRAN-ODT) 8 MG disintegrating tablet Take 8 mg by mouth every 8 (eight) hours as needed for vomiting or nausea. 10/01/19  Yes [provider]  prazosin (MINIPRESS) 2 MG capsule Take 2 mg by mouth at bedtime. 04/24/20  Yes [provider]  VENTOLIN HFA 108 (90 Base) MCG/ACT inhaler Inhale 2 puffs into the lungs every 4 (four) hours as needed for wheezing or shortness of breath. 04/07/18  Yes [provider]  methylPREDNISolone (MEDROL DOSEPAK) 4 MG TBPK tablet Take by mouth. 04/28/20   [provider]  predniSONE (STERAPRED UNI-PAK 21 TAB) 10 MG (21) TBPK tablet Take by mouth as directed. 02/27/20   [provider]    Allergies    Amoxicillin, Latex, Penicillins, and Prednisone  Review of Systems   Review of Systems  Respiratory: Positive for shortness of breath.   Cardiovascular: Positive for chest pain.   All other systems reviewed and are negative except that which was mentioned in HPI  Physical Exam Updated Vital Signs BP (!) 103/57   Pulse (!) 101   Temp 97.9 F (36.6 C) (Oral)   Resp 19   Ht 5\' 4"  (1.626 m)   Wt 63.5 kg   SpO2 96%   BMI 24.03 kg/m   Physical Exam Constitutional:      General: She is not in acute distress.    Appearance: Normal appearance. She is ill-appearing. She is not  toxic-appearing.  HENT:     Head: Normocephalic and atraumatic.     Mouth/Throat:     Mouth: Mucous membranes are dry.  Eyes:     Conjunctiva/sclera: Conjunctivae normal.  Cardiovascular:     Rate and Rhythm: Regular rhythm. Tachycardia present.     Heart sounds: Normal heart sounds. No murmur heard.   Pulmonary:     Effort: Pulmonary effort is normal.  Breath sounds: Normal breath sounds.  Abdominal:     General: Abdomen is flat. Bowel sounds are normal. There is no distension.     Palpations: Abdomen is soft.     Tenderness: There is no abdominal tenderness.  Musculoskeletal:     Right lower leg: No edema.     Left lower leg: No edema.  Skin:    General: Skin is warm and dry.  Neurological:     Mental Status: She is alert and oriented to person, place, and time.     Comments: fluent  Psychiatric:        Mood and Affect: Mood is anxious.        Behavior: Behavior normal.     ED Results / Procedures / Treatments   Labs (all labs ordered are listed, but only abnormal results are displayed) Labs Reviewed  BASIC METABOLIC PANEL - Abnormal; Notable for the following components:      Result Value   Sodium 132 (*)    CO2 18 (*)    Glucose, Bld 437 (*)    Calcium 8.8 (*)    All other components within normal limits  HEPATIC FUNCTION PANEL - Abnormal; Notable for the following components:   Total Protein 5.9 (*)    Albumin 3.4 (*)    All other components within normal limits  CBG MONITORING, ED - Abnormal; Notable for the following components:   Glucose-Capillary 476 (*)    All other components within normal limits  CBG MONITORING, ED - Abnormal; Notable for the following components:   Glucose-Capillary 354 (*)    All other components within normal limits  CBC  D-DIMER, QUANTITATIVE  LIPASE, BLOOD  URINALYSIS, ROUTINE W REFLEX MICROSCOPIC  BASIC METABOLIC PANEL  I-STAT BETA HCG BLOOD, ED ([facility], [facility], [facility] ONLY)  POC URINE PREG, ED  TROPONIN I (HIGH SENSITIVITY)   TROPONIN I (HIGH SENSITIVITY)    EKG EKG Interpretation  Date/Time:  Saturday April 29 2020 03:36:00 EST Ventricular Rate:  116 PR Interval:  118 QRS Duration: 74 QT Interval:  330 QTC Calculation: 458 R Axis:   66 Text Interpretation: Sinus tachycardia Otherwise normal ECG tachycardia new from previous Confirmed by Frederick Peers [phone]) on 04/29/2020 5:13:52 AM   Radiology DG Chest Port 1 View  Result Date: 04/29/2020 CLINICAL DATA:  COVID positive, dyspnea EXAM: PORTABLE CHEST 1 VIEW COMPARISON:  02/09/2020 FINDINGS: The heart size and mediastinal contours are within normal limits. Both lungs are clear. The visualized skeletal structures are unremarkable. IMPRESSION: No active disease. Electronically Signed   By: Helyn Numbers MD   On: 04/29/2020 04:40    Procedures Procedures   Medications Ordered in ED Medications  insulin glargine (LANTUS) injection 32 Units (32 Units Subcutaneous Given 04/29/20 0550)  lactated ringers bolus 2,000 mL (0 mLs Intravenous Stopped 04/29/20 0615)  insulin aspart (novoLOG) injection 5 Units (5 Units Intravenous Given 04/29/20 0512)  ondansetron (ZOFRAN) injection 4 mg (4 mg Intravenous Given 04/29/20 0506)    ED Course  I have reviewed the triage vital signs and the nursing notes.  Pertinent labs & imaging results that were available during my care of the patient were reviewed by me and considered in my medical decision making (see chart for details).     Melody Myers was evaluated in Emergency Department on 04/29/2020 for the symptoms described in the history of present illness. She was evaluated in the context of the global COVID-19 pandemic, which necessitated consideration that the patient might be at risk  for infection with the SARS-CoV-2 virus that causes COVID-19. Institutional protocols and algorithms that pertain to the evaluation of patients at risk for COVID-19 are in a state of rapid change based on information released by regulatory bodies  including the CDC and federal and state organizations. These policies and algorithms were followed during the patient's care in the ED.  MDM Rules/Calculators/A&P                          Ill-appearing and tachycardic but non-toxic on exam, O2 sats 98% on RA. CXR Clear. EKG w/ sinus tach, no ischemic changes. LAbs show hyperglycemia with normal AG, CO2 18, trop negative. Gave morning dose of lantus as well as novolog, IVF bolus, zofran.  I have ordered a repeat BMP to ensure improvement. Pt appears stable from a respiratory standpoint. Signed out pending reassessment and repeat BMP results.  Final Clinical Impression(s) / ED Diagnoses Final diagnoses:  Pain    Rx / DC Orders ED Discharge Orders    None       Carter Kaman, Ambrose Finland, MD 04/29/20 0710    Clarene Duke Ambrose Finland, MD 04/29/20 [phone]

## 2020-04-29 NOTE — ED Provider Notes (Signed)
8:05 AM Care assumed from Dr. Clarene Duke.  At time of transfer of care, patient is awaiting reassessment and repeat lab work.  Patient is having a repeat BMP to ensure that her bicarb is improving.  We suspect that her hyperglycemia and symptoms are related to the steroids she received to help treat the coronavirus she was recently diagnosed with.  If patient is feeling better and labs are improved, dissipate discharge home with plans to follow-up with PCP and stop using the steroid she was prescribed.  9:13 AM Patient's repeat BMP has returned and it is improving.  Her anion gap continues to improve now down to 10 and her CO2 is also rising and improving up to 20.  Patient reports she is feeling better and her glucose is improved into the 300s.  She reports she is feeling much better and would like to try going home now.  Patient will maintain hydration at home and continue her home glucose medications.  She will stop taking the steroids and follow-up with her primary doctor.  She is otherwise feeling better and understands return precautions.  She has no other questions or concerns and was discharged in good condition with improved symptoms and improving lab testing.    Clinical Impression: 1. Hyperglycemia   2. Pain   3. COVID-19 virus infection     Disposition: Discharge  Condition: Good  I have discussed the results, Dx and Tx plan with the pt(& family if present). He/she/they expressed understanding and agree(s) with the plan. Discharge instructions discussed at great length. Strict return precautions discussed and pt &/or family have verbalized understanding of the instructions. No further questions at time of discharge.    Current Discharge Medication List      Follow Up: Laurel Dimmer, FNP [address] [place] [address] [phone]  Schedule an appointment as soon as possible for a visit in 2 days        Daniesha Driver, Canary Brim, MD 04/29/20  [phone]

## 2020-04-29 NOTE — Discharge Instructions (Signed)
STOP TAKING STEROID AS IT IS LIKELY MAKING YOUR BLOOD SUGAR RUN HIGH.

## 2020-06-09 ENCOUNTER — Other Ambulatory Visit: Payer: Self-pay

## 2020-06-12 ENCOUNTER — Telehealth: Payer: Self-pay

## 2020-06-12 NOTE — Telephone Encounter (Signed)
Dr. Bing Matter, please review this case.  Patient is a 40 year old female with past medical history of CAD, Last PCI was on 10/02/2018 at which time she received DES to proximal OM1, she had moderate LAD, proximal left circumflex and RCA disease.  She did undergo Myoview in October 2020.  Patient was last seen in November 2021 at which time she complained of atypical chest discomfort that is improved when she is placed on anxiolytic.  Her subsequent echocardiogram obtained on 02/11/2020 showed normal EF 60 to 65%, grade 1 DD, normal valve function.  Talking with the patient today, she says she was unable to find a doctor to treat her anxiety issue.  Her chest discomfort has not really changed since the last visit and that does occur on a daily basis.  Sometimes last half a day at a time.   She has upcoming GI procedure and requires to come off of Plavix for 5 days.  Her chest pain is going on on daily basis however sounds fairly atypical and this is the same symptoms she had last year.  Dr. Bing Matter, please comment on holding Plavix and whether the patient will need another office visit.  Please forward her response to P CV DIV PREOP

## 2020-06-12 NOTE — Telephone Encounter (Signed)
   [facility] Pre-operative Risk Assessment    Request for surgical clearance:  1. What type of surgery is being performed? Colonoscopy and EGD   2. When is this surgery scheduled? TBD   3. What type of clearance is required (medical clearance vs. Pharmacy clearance to hold med vs. Both)? Both   4. Are there any medications that need to be held prior to surgery and how long?Plavix to be held 5-7 days if approved   5. Practice name and name of physician performing surgery? Dr. Truman Hayward at [facility]   6. What is your office phone number: [phone]    0.   What is your office fax number: [phone]  8.   Anesthesia type (None, local, MAC, general) ? None specified   Basil Dess Vinny Taranto 06/12/2020, 1:41 PM  _________________________________________________________________   (provider comments below)

## 2020-06-13 ENCOUNTER — Ambulatory Visit (INDEPENDENT_AMBULATORY_CARE_PROVIDER_SITE_OTHER): Payer: Medicare Other | Admitting: Cardiology

## 2020-06-13 ENCOUNTER — Encounter: Payer: Self-pay | Admitting: Cardiology

## 2020-06-13 ENCOUNTER — Other Ambulatory Visit: Payer: Self-pay

## 2020-06-13 VITALS — BP 110/72 | HR 89 | Ht 64.0 in | Wt 138.0 lb

## 2020-06-13 DIAGNOSIS — E785 Hyperlipidemia, unspecified: Secondary | ICD-10-CM | POA: Diagnosis not present

## 2020-06-13 DIAGNOSIS — I2510 Atherosclerotic heart disease of native coronary artery without angina pectoris: Secondary | ICD-10-CM | POA: Diagnosis not present

## 2020-06-13 DIAGNOSIS — F319 Bipolar disorder, unspecified: Secondary | ICD-10-CM | POA: Diagnosis not present

## 2020-06-13 DIAGNOSIS — Z720 Tobacco use: Secondary | ICD-10-CM | POA: Diagnosis not present

## 2020-06-13 NOTE — Patient Instructions (Signed)
Medication Instructions:  Your physician recommends that you continue on your current medications as directed. Please refer to the Current Medication list given to you today.  *If you need a refill on your cardiac medications before your next appointment, please call your pharmacy*   Lab Work: Your physician recommends that you return for lab work today: lipid  If you have labs (blood work) drawn today and your tests are completely normal, you will receive your results only by: . MyChart Message (if you have MyChart) OR . A paper copy in the mail If you have any lab test that is abnormal or we need to change your treatment, we will call you to review the results.   Testing/Procedures: None   Follow-Up: At [facility], you and your health needs are our priority.  As part of our continuing mission to provide you with exceptional heart care, we have created designated Provider Care Teams.  These Care Teams include your primary Cardiologist (physician) and Advanced Practice Providers (APPs -  Physician Assistants and Nurse Practitioners) who all work together to provide you with the care you need, when you need it.  We recommend signing up for the patient portal called "MyChart".  Sign up information is provided on this After Visit Summary.  MyChart is used to connect with patients for Virtual Visits (Telemedicine).  Patients are able to view lab/test results, encounter notes, upcoming appointments, etc.  Non-urgent messages can be sent to your provider as well.   To learn more about what you can do with MyChart, go to [email or web address].    Your next appointment:   5 month(s)  The format for your next appointment:   In Person  Provider:   Robert Krasowski, MD   Other Instructions    

## 2020-06-13 NOTE — Addendum Note (Signed)
Addended by: Hazle Quant on: 06/13/2020 02:20 PM   Modules accepted: Orders

## 2020-06-13 NOTE — Progress Notes (Signed)
Cardiology Office Note:    Date:  06/13/2020   ID:  Melody Myers, DOB 12-05-1980, MRN [id number]  PCP:  Pcp, No  Cardiologist:  Gypsy Balsam, MD    Referring MD: Selena Batten *   Chief Complaint  Patient presents with  . Follow-up  I had COVID  History of Present Illness:    Melody Myers is a 40 y.o. female with past medical history significant for coronary artery disease, in August 2020 required PTCA and stenting of the optimal marginal branch.  At the same time she was found to have diffuse moderate disease in other vessels.  Her past medical history is also significant for diabetes which is poorly controlled, dyslipidemia, she continues smokes.  She comes today 2 months of follow-up.  2 months ago she suffered from COVID-19 infection likely she recovered quite nicely.  She denies have any chest pain tightness squeezing pressure burning chest.  She described it she recently tried to quit smoking she smokes only 1 to 2 cigarettes a day.  I congratulated her for her weight and I strongly recommended to completely quit.  She has been find to have some GI issues and gastroscopy endoscopy is contemplated.  Past Medical History:  Diagnosis Date  . Anxiety   . Asthma   . Bipolar 1 disorder (HCC)   . Bipolar depression (HCC) 03/20/2013  . Carpal tunnel syndrome 02/04/2013   Formatting of this note might be different from the original. IMPRESSION: Order right CTI 73220- ASAP  . Chest pain 12/24/2018  . Chronic pain 03/20/2013  . 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 Repaired at [facility] in Sept 2016.  Pt states that all problems are still present.  If followed by [facility] for this. She has f/u appt.  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  . 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 10/14/2018  . Degenerative disc disease, lumbar 06/05/2015   Formatting of this note might be  different from the original. L4-5 > L3-4, mild  . Depression    patient reports bipolor  . Depression   . Diabetic neuropathy (HCC)   . DKA (diabetic ketoacidoses) 03/19/2013  . History of adenomatous polyp of colon 04/17/2015   Formatting of this note might be different from the original. Per path 2016  . Hyperlipidemia LDL goal <70 10/04/2018  . Hypokalemia 11/15/2014  . Ingrown toenail of both feet 04/12/2019   Formatting of this note might be different from the original. great toenails bilateral  . Insulin dependent diabetes mellitus with complications    insulin dependant  . Jaw pain 02/24/2020  . Leukocytosis 03/20/2013  . Myocardial infarction (HCC) 10/02/2018  . Neuropathy   . Non-ST elevation (NSTEMI) myocardial infarction (HCC) 10/02/2018  . NSTEMI (non-ST elevated myocardial infarction) (HCC) 10/02/2018  . Superficial incisional surgical site infection 11/14/2014  . Syncopal episodes   . Tobacco abuse 03/20/2013  . Urinary retention 11/14/2014    Past Surgical History:  Procedure Laterality Date  . CORONARY STENT INTERVENTION N/A 10/02/2018   Procedure: CORONARY STENT INTERVENTION;  Surgeon: Yvonne Kendall, MD;  Location: [facility] INVASIVE CV LAB;  Service: Cardiovascular;  Laterality: N/A;  . LEFT HEART CATH AND CORONARY ANGIOGRAPHY N/A 10/02/2018   Procedure: LEFT HEART CATH AND CORONARY ANGIOGRAPHY;  Surgeon: Yvonne Kendall, MD;  Location: [facility] INVASIVE CV LAB;  Service: Cardiovascular;  Laterality: N/A;  . TUBAL LIGATION    . VAGINA RECONSTRUCTION SURGERY  10/2014   & ana, reconstruction    Current Medications: Current Meds  Medication Sig  . acetaminophen (TYLENOL) 325 MG tablet Take 650 mg by mouth every 6 (six) hours as needed for moderate pain or headache.   . albuterol (PROVENTIL) (2.5 MG/3ML) 0.083% nebulizer solution Take 2.5 mg by nebulization as needed for wheezing or shortness of breath.  Marland Kitchen aspirin EC 81 MG EC tablet Take 1 tablet (81 mg total) by mouth daily.  Marland Kitchen  atorvastatin (LIPITOR) 80 MG tablet Take 1 tablet (80 mg total) by mouth daily at 6 PM.  . BREO ELLIPTA 100-25 MCG/INH AEPB Inhale 1 puff into the lungs daily.  . clopidogrel (PLAVIX) 75 MG tablet Take 1 tablet (75 mg total) by mouth daily.  Marland Kitchen doxycycline (VIBRAMYCIN) 100 MG capsule Take 100 mg by mouth as needed (spot on her Groin).   . famotidine (PEPCID) 40 MG tablet Take 40 mg by mouth 2 (two) times daily.  . fluticasone (FLONASE) 50 MCG/ACT nasal spray Place 1 spray into both nostrils daily.  . Fluticasone-Salmeterol (ADVAIR) 250-50 MCG/DOSE AEPB Inhale 1 puff into the lungs 2 (two) times daily.  Marland Kitchen gabapentin (NEURONTIN) 300 MG capsule Take 300-600 capsules by mouth 3 (three) times daily as needed (pain).  Marland Kitchen GLUCAGON EMERGENCY 1 MG injection Inject 1 mg as directed as needed. Low blood sugar episodes  . insulin aspart (NOVOLOG) 100 UNIT/ML injection Inject 10-15 Units into the skin 4 (four) times daily. Sliding scale  . Insulin Glargine (BASAGLAR KWIKPEN) 100 UNIT/ML SOPN Inject 32 Units into the skin daily.  Marland Kitchen loperamide (IMODIUM) 2 MG capsule Take 2 mg by mouth as needed for diarrhea or loose stools.  . metoprolol tartrate (LOPRESSOR) 25 MG tablet TAKE 1 TABLET BY MOUTH TWICE A DAY (Patient taking differently: Take 25 mg by mouth 2 (two) times daily.)  . montelukast (SINGULAIR) 10 MG tablet Take 10 mg by mouth at bedtime.  . Multiple Vitamin (MULTIVITAMIN WITH MINERALS) TABS Take 1 tablet by mouth daily. Unknown strength  . nitroGLYCERIN (NITROSTAT) 0.4 MG SL tablet PLACE 1 TABLET UNDER THE TONGUE EVERY 5 MINUTES X 3 DOSES AS NEEDED FOR CHEST PAIN. MAX3TAB/15MIN (Patient taking differently: Place 0.4 mg under the tongue every 5 (five) minutes as needed for chest pain.)  . VENTOLIN HFA 108 (90 Base) MCG/ACT inhaler Inhale 2 puffs into the lungs every 4 (four) hours as needed for wheezing or shortness of breath.  . [DISCONTINUED] cyclobenzaprine (FLEXERIL) 10 MG tablet Take 0.5 tablets (5 mg  total) by mouth 3 (three) times daily as needed for muscle spasms.  . [DISCONTINUED] lithium carbonate 300 MG capsule Take 300 mg by mouth at bedtime.  . [DISCONTINUED] ondansetron (ZOFRAN-ODT) 8 MG disintegrating tablet Take 8 mg by mouth every 8 (eight) hours as needed for vomiting or nausea.  . [DISCONTINUED] pantoprazole (PROTONIX) 40 MG tablet Take 1 tablet by mouth daily.     Allergies:   Amoxicillin, Latex, Penicillins, and Prednisone   Social History   Socioeconomic History  . Marital status: Single    Spouse name: Not on file  . Number of children: Not on file  . Years of education: Not on file  . Highest education level: Not on file  Occupational History  . Not on file  Tobacco Use  . Smoking status: Current Every Day Smoker    Packs/day: 0.25    Types: Cigarettes  . Smokeless tobacco: Never Used  Vaping Use  . Vaping Use: Never used  Substance and  Sexual Activity  . Alcohol use: Yes    Comment: occasional  . Drug use: No  . Sexual activity: Yes    Comment: tubal ligation  Other Topics Concern  . Not on file  Social History Narrative  . Not on file   Social Determinants of Health   Financial Resource Strain: Not on file  Food Insecurity: Not on file  Transportation Needs: Not on file  Physical Activity: Not on file  Stress: Not on file  Social Connections: Not on file     Family History: The patient's family history includes Heart attack (age of onset: 10) in her brother; Heart attack (age of onset: 95) in her father. ROS:   Please see the history of present illness.    All 14 point review of systems negative except as described per history of present illness  EKGs/Labs/Other Studies Reviewed:      Recent Labs: 04/29/2020: ALT 17; BUN 11; Creatinine, Ser 0.73; Hemoglobin 14.3; Platelets 207; Potassium 3.6; Sodium 134  Recent Lipid Panel    Component Value Date/Time   CHOL 97 (L) 09/13/2019 1204   TRIG 71 09/13/2019 1204   HDL 33 (L) 09/13/2019  1204   CHOLHDL 2.9 09/13/2019 1204   LDLCALC 49 09/13/2019 1204    Physical Exam:    VS:  BP 110/72 (BP Location: Left Arm, Patient Position: Sitting)   Pulse 89   Ht 5\' 4"  (1.626 m)   Wt 138 lb (62.6 kg)   SpO2 96%   BMI 23.69 kg/m     Wt Readings from Last 3 Encounters:  06/13/20 138 lb (62.6 kg)  04/29/20 140 lb (63.5 kg)  01/14/20 136 lb (61.7 kg)     GEN:  Well nourished, well developed in no acute distress HEENT: Normal NECK: No JVD; No carotid bruits LYMPHATICS: No lymphadenopathy CARDIAC: RRR, no murmurs, no rubs, no gallops RESPIRATORY:  Clear to auscultation without rales, wheezing or rhonchi  ABDOMEN: Soft, non-tender, non-distended MUSCULOSKELETAL:  No edema; No deformity  SKIN: Warm and dry LOWER EXTREMITIES: no swelling NEUROLOGIC:  Alert and oriented x 3 PSYCHIATRIC:  Normal affect   ASSESSMENT:    1. Coronary artery disease involving native coronary artery of native heart without angina pectoris   2. Tobacco abuse   3. Hyperlipidemia LDL goal <70   4. Bipolar depression (HCC)    PLAN:    In order of problems listed above:  1. Coronary disease status post PTCA and stenting of the obtuse marginal branch in August 2020.  She is on dual antiplatelet therapy and I will continue this therapy indefinitely because of multiple risk factors for coronary artery disease.  She is scheduled to have upper endoscopy with colonoscopy, around the time of does procedure we can temporarily interrupt dual antiplatelet therapy for 5 to 7 days. 2. Tobacco abuse I congratulated her she said she smokes only 1 to 2 cigarettes a day and I also told her that optimal goal is completely discontinue smoking. 3. Dyslipidemia, I did review her K PN however latest data have is from summer of last year with LDL was 49 HDL 33.  We will repeat her fasting lipid profile tomorrow morning she is on high intensity statin form of Lipitor 80 which I will continue. 4. Bipolar depression that  being addressed by primary care physician and psychiatrist.  However today I find her in pretty good spirit.   Medication Adjustments/Labs and Tests Ordered: Current medicines are reviewed at length with the patient today.  Concerns regarding medicines are outlined above.  No orders of the defined types were placed in this encounter.  Medication changes: No orders of the defined types were placed in this encounter.   Signed, Georgeanna Lea, MD, [facility] 06/13/2020 2:12 PM    [facility]

## 2020-06-13 NOTE — Telephone Encounter (Signed)
    Melody Myers DOB:  1980-11-22  MRN:  [id number]   Primary Cardiologist: Gypsy Balsam, MD  Chart reviewed as part of pre-operative protocol coverage. Given past medical history and time since last visit, based on ACC/AHA guidelines, Melody Myers would be at acceptable risk for the planned procedure without further cardiovascular testing.   The patient was advised that if she develops new symptoms prior to surgery to contact our office to arrange for a follow-up visit, and she verbalized understanding.  Patient is cleared to hold Plavix for 7 days prior to the intended procedure and restart as soon as possible afterward at the surgeon's discretion.  I will route this recommendation to the requesting party via Epic fax function and remove from pre-op pool.  Please call with questions.  [place], [place] 06/13/2020, 1:04 PM

## 2020-06-13 NOTE — Telephone Encounter (Signed)
Pt has been made aware ok to hold Plavix x 7 days prior to procedure. Pt aware she will restart Plavix once surgeon feels it is safe to restart. Pt thanked me for the call. I will fax clearance notes to requesting office. Pt thanked me for the call and our help.

## 2020-06-13 NOTE — Telephone Encounter (Signed)
Please inform the patient to hold Plavix for 7 days prior to the GI procedure.

## 2020-06-20 LAB — LIPID PANEL
Chol/HDL Ratio: 2.5 ratio (ref 0.0–4.4)
Cholesterol, Total: 100 mg/dL (ref 100–199)
HDL: 40 mg/dL (ref >39)
LDL Chol Calc (NIH): 46 mg/dL (ref 0–99)
Triglycerides: 66 mg/dL (ref 0–149)
VLDL Cholesterol Cal: 14 mg/dL (ref 5–40)

## 2020-06-22 ENCOUNTER — Telehealth: Payer: Self-pay

## 2020-06-22 NOTE — Telephone Encounter (Signed)
-----   Message from Georgeanna Lea, MD sent at 06/21/2020  5:15 PM EDT ----- Cholesterol excellent, continue present management

## 2020-06-22 NOTE — Telephone Encounter (Signed)
Patient notified of results.

## 2020-07-05 ENCOUNTER — Telehealth: Payer: Self-pay | Admitting: Cardiology

## 2020-07-05 ENCOUNTER — Other Ambulatory Visit: Payer: Self-pay

## 2020-07-05 MED ORDER — ATORVASTATIN CALCIUM 80 MG PO TABS: 80.0000 mg | ORAL_TABLET | Freq: Every day | ORAL | 3 refills | Status: DC

## 2020-07-05 MED ORDER — METOPROLOL TARTRATE 25 MG PO TABS: 25.0000 mg | ORAL_TABLET | Freq: Two times a day (BID) | ORAL | 3 refills | Status: DC

## 2020-07-05 MED ORDER — NITROGLYCERIN 0.4 MG SL SUBL: SUBLINGUAL_TABLET | SUBLINGUAL | 3 refills | Status: DC

## 2020-07-05 NOTE — Telephone Encounter (Signed)
*  STAT* If patient is at the pharmacy, call can be transferred to refill team.   1. Which medications need to be refilled? (please list name of each medication and dose if known) atorvastatin (LIPITOR) 80 MG tablet metoprolol tartrate (LOPRESSOR) 25 MG tablet nitroGLYCERIN (NITROSTAT) 0.4 MG SL tablet 2. Which pharmacy/location (including street and city if local pharmacy) is medication to be sent to? [id number] - [place],  - [address]  3. Do they need a 30 day or 90 day supply? 90 day supply    [facility] is completely out of medication. Has been out since yesterday.

## 2020-07-05 NOTE — Telephone Encounter (Signed)
RX sent via [employer]

## 2020-07-12 ENCOUNTER — Ambulatory Visit (INDEPENDENT_AMBULATORY_CARE_PROVIDER_SITE_OTHER): Payer: Medicare Other | Admitting: Family Medicine

## 2020-07-12 ENCOUNTER — Other Ambulatory Visit: Payer: Self-pay

## 2020-07-12 ENCOUNTER — Encounter: Payer: Self-pay | Admitting: Family Medicine

## 2020-07-12 VITALS — BP 118/70 | HR 91 | Temp 97.3°F | Ht 63.5 in | Wt 134.0 lb

## 2020-07-12 DIAGNOSIS — I2510 Atherosclerotic heart disease of native coronary artery without angina pectoris: Secondary | ICD-10-CM | POA: Diagnosis not present

## 2020-07-12 DIAGNOSIS — F319 Bipolar disorder, unspecified: Secondary | ICD-10-CM

## 2020-07-12 DIAGNOSIS — E785 Hyperlipidemia, unspecified: Secondary | ICD-10-CM

## 2020-07-12 DIAGNOSIS — G8929 Other chronic pain: Secondary | ICD-10-CM

## 2020-07-12 DIAGNOSIS — F17200 Nicotine dependence, unspecified, uncomplicated: Secondary | ICD-10-CM

## 2020-07-12 DIAGNOSIS — J4540 Moderate persistent asthma, uncomplicated: Secondary | ICD-10-CM

## 2020-07-12 DIAGNOSIS — E1040 Type 1 diabetes mellitus with diabetic neuropathy, unspecified: Secondary | ICD-10-CM | POA: Diagnosis not present

## 2020-07-12 DIAGNOSIS — J309 Allergic rhinitis, unspecified: Secondary | ICD-10-CM

## 2020-07-12 DIAGNOSIS — K219 Gastro-esophageal reflux disease without esophagitis: Secondary | ICD-10-CM

## 2020-07-12 LAB — COMPREHENSIVE METABOLIC PANEL
ALT: 15 U/L (ref 0–35)
AST: 15 U/L (ref 0–37)
Albumin: 5 g/dL (ref 3.5–5.2)
Alkaline Phosphatase: 74 U/L (ref 39–117)
BUN: 10 mg/dL (ref 6–23)
CO2: 27 mEq/L (ref 19–32)
Calcium: 9.9 mg/dL (ref 8.4–10.5)
Chloride: 102 mEq/L (ref 96–112)
Creatinine, Ser: 0.71 mg/dL (ref 0.40–1.20)
GFR: 108.06 mL/min (ref >60.00)
Glucose, Bld: 54 mg/dL — ABNORMAL LOW (ref 70–99)
Potassium: 3.6 mEq/L (ref 3.5–5.1)
Sodium: 139 mEq/L (ref 135–145)
Total Bilirubin: 0.5 mg/dL (ref 0.2–1.2)
Total Protein: 7.5 g/dL (ref 6.0–8.3)

## 2020-07-12 LAB — MICROALBUMIN / CREATININE URINE RATIO
Creatinine,U: 17.4 mg/dL
Microalb Creat Ratio: 4 mg/g (ref 0.0–30.0)
Microalb, Ur: <0.7 mg/dL (ref 0.0–1.9)

## 2020-07-12 LAB — CBC
HCT: 43.9 % (ref 36.0–46.0)
Hemoglobin: 15.3 g/dL — ABNORMAL HIGH (ref 12.0–15.0)
MCHC: 34.8 g/dL (ref 30.0–36.0)
MCV: 87 fl (ref 78.0–100.0)
Platelets: 248 10*3/uL (ref 150.0–400.0)
RBC: 5.04 Mil/uL (ref 3.87–5.11)
RDW: 13.3 % (ref 11.5–15.5)
WBC: 10.3 10*3/uL (ref 4.0–10.5)

## 2020-07-12 LAB — HEMOGLOBIN A1C: Hgb A1c MFr Bld: 8 % — ABNORMAL HIGH (ref 4.6–6.5)

## 2020-07-12 MED ORDER — GLUCAGON EMERGENCY 1 MG IJ KIT: 1.0000 mg | PACK | INTRAMUSCULAR | 11 refills | Status: DC | PRN

## 2020-07-12 MED ORDER — BASAGLAR KWIKPEN 100 UNIT/ML ~~LOC~~ SOPN: 32.0000 [IU] | PEN_INJECTOR | Freq: Every day | SUBCUTANEOUS | 11 refills | Status: DC

## 2020-07-12 MED ORDER — FLUTICASONE PROPIONATE 50 MCG/ACT NA SUSP: 1.0000 | Freq: Every day | NASAL | 6 refills | Status: DC

## 2020-07-12 MED ORDER — BREO ELLIPTA 100-25 MCG/INH IN AEPB: 1.0000 | INHALATION_SPRAY | Freq: Every day | RESPIRATORY_TRACT | 11 refills | Status: DC

## 2020-07-12 MED ORDER — CETIRIZINE HCL 10 MG PO TABS: 10.0000 mg | ORAL_TABLET | Freq: Every day | ORAL | 11 refills | Status: DC

## 2020-07-12 MED ORDER — PANTOPRAZOLE SODIUM 40 MG PO TBEC: 40.0000 mg | DELAYED_RELEASE_TABLET | Freq: Every day | ORAL | 3 refills | Status: DC

## 2020-07-12 NOTE — Progress Notes (Signed)
[facility] [address] [place] [address] Dept: [phone] Dept Fax: [phone]  New Patient Office Visit  Subjective:    Patient ID: Melody Myers, female    DOB: 01-23-1981, 40 y.o..   MRN: [id number]  Chief Complaint  Patient presents with  . Establish Care    NP- Establish care. Needs referral to Endocrinologist, refills on meds and has been having ST for 2-3 months.      History of Present Illness:  Patient is in today to establish care. Ms. Munar is originally from [place], [place], but has lived in [place], [place] since she was a young child. She notes she is a widow. She has two children (17, 14). She is currently disabled related to bipolar disorder, diabetes, and diabetic neuropathy. She does smoke about 1/4 ppd, but was previously at 2 ppd. She has been actively working to stop smoking. She only rarely uses alcohol and denies any drug use.  Ms. Garside has a history of a prior MI (Aug 2020) with an apparent single-vessel blockage of the obtuse marginal. She had a stent placement. She is currently on a daily aspirin, Plavix, and metoprolol. She has NTG available, but has not needed this. She also has a history of hyperlipidemia and is managed on atorvastatin 80 mg.   Ms. Balcerzak has a history of Type 1 diabetes, diagnosed at age 5. She is currently managed on insulin glargine (basal) 32 units qAM and insulin aspart 4-6 units with meals. She notes he blood sugars range from 50 to 300s. She was previously recommended to be on an insulin pump. She notes it has been about 2 years since her last eye exam. She sees a podiatrist regularly for foot care. She note an issue with neuropathy, but has trouble quantifying her symptoms of this. She describes chronic pain involving joints throughout her body and back. She was previously on gabapentin, but stopped this. She takes ~ 4 Tylenol 500 mg daily for pain management.  Ms. Matusik has  a history of asthma and allergic rhinitis. She feels her asthma is not well controlled. She uses [employer], as well as Advair daily. She also uses both an albuterol MDI and nebulizer treatments. She is also on Singulair. She is not managed by a pulmonologist for this. For her allergies, she is using a Flovent nasal inhaler. She also uses Benadryl 1 1/2 tabs about twice a day.  Ms. Prell has a history of frequent heartburn. She has used Pepcid intermittently, but not recently.  Ms. Stjames is currently having issue with a sore throat and hoarseness. This has been present for 2 months.  Ms. Mcjunkins has a history of bipolar depression. She notes she was previously followed by a psychiatrist. Currently she is off of all treatments and feels she is managing well at home.    Past Medical History: Patient Active Problem List   Diagnosis Date Noted  . Allergic rhinitis 07/12/2020  . Gastroesophageal reflux disease without esophagitis 07/12/2020  . Jaw pain 02/24/2020  . Syncopal episodes   . Coronary artery disease- Severe single-vessel CAD with thrombotic occlusion of proximal OM1 10/14/2018  . Hyperlipidemia LDL goal <70 10/04/2018  . NSTEMI (non-ST elevated myocardial infarction) ([place]) 10/02/2018  . History of adenomatous polyp of colon 04/17/2015  . Urinary retention 11/14/2014  . 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 Bipolar depression ([place]) 03/20/2013  . Chronic pain 03/20/2013  . Tobacco use disorder 03/20/2013  . Diabetic neuropathy ([place])   .  Type 1 diabetes mellitus with diabetic neuropathy, unspecified ([place])   . DKA (diabetic ketoacidoses) 03/19/2013  . Asthma   . Neuropathy   . Carpal tunnel syndrome 02/04/2013   Past Surgical History:  Procedure Laterality Date  . CORONARY STENT INTERVENTION N/A 10/02/2018   Procedure: CORONARY STENT INTERVENTION;  Surgeon: Nelva Bush, MD;  Location: [facility];  Service: Cardiovascular;  Laterality: N/A;  . LEFT HEART CATH AND CORONARY  ANGIOGRAPHY N/A 10/02/2018   Procedure: LEFT HEART CATH AND CORONARY ANGIOGRAPHY;  Surgeon: Nelva Bush, MD;  Location: [facility];  Service: Cardiovascular;  Laterality: N/A;  . TUBAL LIGATION    . VAGINA RECONSTRUCTION SURGERY  10/2014   & ana, reconstruction   Family History  Problem Relation Age of Onset  . Heart attack Father 82  . Heart attack Brother 29   Outpatient Medications Prior to Visit  Medication Sig Dispense Refill  . acetaminophen (TYLENOL) 325 MG tablet Take 650 mg by mouth every 6 (six) hours as needed for moderate pain or headache.     . albuterol (PROVENTIL) (2.5 MG/3ML) 0.083% nebulizer solution Take 2.5 mg by nebulization as needed for wheezing or shortness of breath.    Marland Kitchen aspirin EC 81 MG EC tablet Take 1 tablet (81 mg total) by mouth daily.    Marland Kitchen atorvastatin (LIPITOR) 80 MG tablet Take 1 tablet (80 mg total) by mouth daily at 6 PM. 90 tablet 3  . clopidogrel (PLAVIX) 75 MG tablet Take 1 tablet (75 mg total) by mouth daily. 90 tablet 3  . famotidine (PEPCID) 40 MG tablet Take 40 mg by mouth 2 (two) times daily.    . Fluticasone-Salmeterol (ADVAIR) 250-50 MCG/DOSE AEPB Inhale 1 puff into the lungs 2 (two) times daily.    . insulin aspart (NOVOLOG) 100 UNIT/ML injection Inject 10-15 Units into the skin 4 (four) times daily. Sliding scale    . Insulin Glargine (BASAGLAR KWIKPEN) 100 UNIT/ML SOPN Inject 32 Units into the skin daily.    Marland Kitchen loperamide (IMODIUM) 2 MG capsule Take 2 mg by mouth as needed for diarrhea or loose stools.    . metoprolol tartrate (LOPRESSOR) 25 MG tablet Take 1 tablet (25 mg total) by mouth 2 (two) times daily. 180 tablet 3  . montelukast (SINGULAIR) 10 MG tablet Take 10 mg by mouth at bedtime.    . Multiple Vitamin (MULTIVITAMIN WITH MINERALS) TABS Take 1 tablet by mouth daily. Unknown strength    . nitroGLYCERIN (NITROSTAT) 0.4 MG SL tablet PLACE 1 TABLET UNDER THE TONGUE EVERY 5 MINUTES X 3 DOSES AS NEEDED FOR CHEST PAIN.  MAX3TAB/15MIN 25 tablet 3  . VENTOLIN HFA 108 (90 Base) MCG/ACT inhaler Inhale 2 puffs into the lungs every 4 (four) hours as needed for wheezing or shortness of breath.    Marland Kitchen BREO ELLIPTA 100-25 MCG/INH AEPB Inhale 1 puff into the lungs daily.    Marland Kitchen gabapentin (NEURONTIN) 300 MG capsule Take 300-600 capsules by mouth 3 (three) times daily as needed (pain). (Patient not taking: Reported on 07/12/2020)    . doxycycline (VIBRAMYCIN) 100 MG capsule Take 100 mg by mouth as needed (spot on her Groin).  (Patient not taking: Reported on 07/12/2020)    . fluticasone (FLONASE) 50 MCG/ACT nasal spray Place 1 spray into both nostrils daily. (Patient not taking: Reported on 07/12/2020)    . GLUCAGON EMERGENCY 1 MG injection Inject 1 mg as directed as needed. Low blood sugar episodes (Patient not taking: Reported on 07/12/2020)    .  prazosin (MINIPRESS) 2 MG capsule Take 2 mg by mouth at bedtime.     No facility-administered medications prior to visit.   Allergies  Allergen Reactions  . Amoxicillin Itching  . Latex Hives  . Penicillins Hives, Itching and Swelling    Did it involve swelling of the face/tongue/throat, SOB, or low BP? No Did it involve sudden or severe rash/hives, skin peeling, or any reaction on the inside of your mouth or nose? No Did you need to seek medical attention at a hospital or doctor's office? No When did it last happen?Childhood If all above answers are "NO", may proceed with cephalosporin use.  . Prednisone Itching and Swelling    Objective:   Today's Vitals   07/12/20 1400  BP: 118/70  Pulse: 91  Temp: (!) 97.3 F (36.3 C)  TempSrc: Temporal  SpO2: 97%  Weight: 134 lb (60.8 kg)  Height: 5' 3.5" (1.613 m)   Body mass index is 23.36 kg/m.   General: Well developed, well nourished. No acute distress. HEENT: Normocephalic, non-traumatic. Mucous membranes moist. Mild cobblestoning of the posterior   oropharynx. Good dentition. Neck: Supple. No lymphadenopathy. No  thyromegaly. Lungs: Clear to auscultation bilaterally. No wheezing, rales or rhonchi. CV: RRR without murmurs or rubs. Pulses 2+ bilaterally. Psych: Alert and oriented. Normal mood and affect.  Health Maintenance Due  Topic Date Due  . COVID-19 Vaccine (1) Never done  . FOOT EXAM  Never done  . OPHTHALMOLOGY EXAM  Never done  . Hepatitis C Screening  Never done  . PAP SMEAR-Modifier  Never done  . HEMOGLOBIN A1C  04/04/2019   Depression screen [facility] 2/9 07/12/2020 07/12/2020  Decreased Interest 2 0  Down, Depressed, Hopeless 3 3  PHQ - 2 Score 5 3  Altered sleeping 1 -  Tired, decreased energy 2 -  Change in appetite 3 -  Feeling bad or failure about yourself  2 -  Trouble concentrating 1 -  Moving slowly or fidgety/restless 2 -  Suicidal thoughts 0 -  PHQ-9 Score 16 -  Difficult doing work/chores Somewhat difficult -   GAD 7 : Generalized Anxiety Score 07/12/2020  Nervous, Anxious, on Edge 2  Control/stop worrying 1  Worry too much - different things 2  Trouble relaxing 2  Restless 1  Easily annoyed or irritable 1  Afraid - awful might happen 1  Total GAD 7 Score 10  Anxiety Difficulty Somewhat difficult   Lab Results Lab Results  Component Value Date   CHOL 100 06/19/2020   HDL 40 06/19/2020   LDLCALC 46 06/19/2020   TRIG 66 06/19/2020   CHOLHDL 2.5 06/19/2020     Assessment & Plan:   1. Coronary artery disease involving native coronary artery of native heart without angina pectoris s/p NSTEMI and stent placement. Continue aspirin, Plavix, and metorpolol.  - CBC  2. Moderate persistent asthma without complication It is unclear why she would need ot be on two different steroid inhalers. I will continue the [facility] for now. Lungs are clear today. AT her next appointment, I will consider getting PFTs vs. a pulmonology referral.  - BREO ELLIPTA 100-25 MCG/INH AEPB; Inhale 1 puff into the lungs daily.  Dispense: 28 each; Refill: 11  3. Type 1 diabetes mellitus with  diabetic neuropathy, unspecified ([place]) We will check screening labs today. I will renew her Glucagon and insulin glargine. I recommended she follow-up for her annual dilated eye exam. I will refer her to endocrinology for assistance with management, including a  consideration for an insulin pump. I will reassess her in 3 months.  - Comprehensive metabolic panel - Hemoglobin A1c - Microalbumin / creatinine urine ratio - Ambulatory referral to Endocrinology - Glucagon, rDNA, (GLUCAGON EMERGENCY) 1 MG KIT; Inject 1 mg as directed as needed. for low blood sugar episodes  Dispense: 1 kit; Refill: 11 - Insulin Glargine (BASAGLAR KWIKPEN) 100 UNIT/ML; Inject 32 Units into the skin daily.  Dispense: 9 mL; Refill: 11  4. Bipolar depression ([place]) Ms. Clodfelter is no longer seeing psychiatry or taking medication. I will monitor her for any sign of a flare of bipolar depression symptoms.  5. Other chronic pain It is unclear what the etiology of her pain might be. We will follow this for now, but would consider a more focused evaluation in the future. I cautioned her about not taking more than 4 gm of Tylenol a day.   6. Tobacco use disorder Recommend she continue efforts to stop smoking. We discussed the high risk this poses for her cardiac disease.  7. Hyperlipidemia LDL goal <70 At goal on Atorvastatin.  8. Gastroesophageal reflux disease without esophagitis Ms. Schlotterbeck's sore throat may be due to a combination of allergic rhinitis and/or her acid reflux. I recommend we start a PPI to see if this will help resolve her symptoms.  - pantoprazole (PROTONIX) 40 MG tablet; Take 1 tablet (40 mg total) by mouth daily.  Dispense: 30 tablet; Refill: 3  9. Allergic rhinitis, unspecified seasonality, unspecified trigger As noted above, this may be contributing to her sore throat. I will renew her Flonase and start her on a daily non-sedating antihistamine.  - fluticasone (FLONASE) 50 MCG/ACT nasal spray; Place  1 spray into both nostrils daily.  Dispense: 11.1 mL; Refill: 6 - cetirizine (ZYRTEC) 10 MG tablet; Take 1 tablet (10 mg total) by mouth daily.  Dispense: 30 tablet; Refill: 11  Haydee Salter, MD

## 2020-07-13 ENCOUNTER — Telehealth: Payer: Self-pay

## 2020-07-13 ENCOUNTER — Other Ambulatory Visit: Payer: Self-pay

## 2020-07-13 NOTE — Telephone Encounter (Signed)
PA for the Glucagon Emergency Kit submitted through cover my meds.  Waiting on response. Dm/cma   Key: [id number]

## 2020-07-13 NOTE — Telephone Encounter (Signed)
PA approved form 02/26/2020-07/13/21.  Pharmacy notified VIA phone.

## 2020-07-27 ENCOUNTER — Ambulatory Visit: Payer: Medicare Other | Admitting: Nurse Practitioner

## 2020-08-01 ENCOUNTER — Ambulatory Visit: Payer: Medicare Other | Admitting: Family Medicine

## 2020-08-07 ENCOUNTER — Other Ambulatory Visit: Payer: Self-pay

## 2020-08-08 ENCOUNTER — Ambulatory Visit: Payer: Medicare Other | Admitting: Family Medicine

## 2020-08-08 ENCOUNTER — Ambulatory Visit (INDEPENDENT_AMBULATORY_CARE_PROVIDER_SITE_OTHER): Payer: Medicare Other | Admitting: Family Medicine

## 2020-08-08 ENCOUNTER — Encounter: Payer: Self-pay | Admitting: Family Medicine

## 2020-08-08 VITALS — BP 106/68 | HR 82 | Temp 97.3°F | Ht 63.5 in | Wt 136.8 lb

## 2020-08-08 DIAGNOSIS — L239 Allergic contact dermatitis, unspecified cause: Secondary | ICD-10-CM | POA: Diagnosis not present

## 2020-08-08 DIAGNOSIS — J312 Chronic pharyngitis: Secondary | ICD-10-CM

## 2020-08-08 MED ORDER — TRIAMCINOLONE ACETONIDE 0.1 % EX CREA: 1.0000 "application " | TOPICAL_CREAM | Freq: Two times a day (BID) | CUTANEOUS | 0 refills | Status: AC

## 2020-08-08 NOTE — Progress Notes (Signed)
[facility] [address] [place] [address] Dept: [phone] Dept Fax: [phone]  Office Visit  Subjective:    Patient ID: Melody Myers, female    DOB: March 19, 1980, 40 y.o..   MRN: [id number]  Chief Complaint  Patient presents with   Acute Visit    C/o having neck pain/swelling x 2 weeks.  She has been gargling salt water with little relief.   Also has a rash on the RT side on her neck x 1 week.     History of Present Illness:  Patient is in today complaining of ongoing sore throat. She notes that soon after her visit with me in Mid-May, she was seen at Urgent Care with a productive cough. She was placed on a course of antibiotics for 7 days. This has not resolved the sore throat issues. Around this time, she notes swelling in her right neck associated with a worsening of the throat pain. She continues to smoke 2-5 cigarettes a day. She feels her allergic rhinitis is improved. She continues to take a PPI for acid reflux.  Also, for the past week, she has had a rash on her right neck. This has been pruritic. She had some mild stiffness in the neck and shoulder. She had a past episode of shingles, so became concerned that this might be recurring.  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   Bipolar depression ([place]) 03/20/2013   Chronic pain 03/20/2013   Tobacco use disorder 03/20/2013   Diabetic neuropathy ([place])    Type 1 diabetes  mellitus with diabetic neuropathy, unspecified ([place])    DKA (diabetic ketoacidoses) 03/19/2013   Asthma    Neuropathy    Carpal tunnel syndrome 02/04/2013   Past Surgical History:  Procedure Laterality Date   CORONARY STENT INTERVENTION N/A 10/02/2018   Procedure: CORONARY STENT INTERVENTION;  Surgeon: Nelva Bush, MD;  Location: [facility];  Service: Cardiovascular;  Laterality: N/A;   LEFT HEART CATH AND CORONARY ANGIOGRAPHY N/A 10/02/2018   Procedure: LEFT HEART CATH AND CORONARY ANGIOGRAPHY;  Surgeon: Nelva Bush, MD;  Location: [facility];  Service: Cardiovascular;  Laterality: N/A;   TUBAL LIGATION     VAGINA RECONSTRUCTION SURGERY  10/2014   & ana, reconstruction   Family History  Problem Relation Age of Onset   Heart attack Father 42   Heart attack Brother 22   Heart disease Brother    COPD Paternal Grandmother    Outpatient Medications Prior to Visit  Medication Sig Dispense Refill   acetaminophen (TYLENOL) 325 MG tablet Take 650 mg by mouth every 6 (six) hours as needed for moderate pain or headache.      albuterol (PROVENTIL) (2.5 MG/3ML) 0.083% nebulizer solution Take 2.5 mg by nebulization as needed for wheezing or shortness of breath.     aspirin EC 81 MG EC tablet Take 1 tablet (81 mg total) by mouth daily.     atorvastatin (LIPITOR) 80 MG tablet Take 1 tablet (80 mg total) by mouth daily  at 6 PM. 90 tablet 3   BREO ELLIPTA 100-25 MCG/INH AEPB Inhale 1 puff into the lungs daily. 28 each 11   cetirizine (ZYRTEC) 10 MG tablet Take 1 tablet (10 mg total) by mouth daily. 30 tablet 11   clopidogrel (PLAVIX) 75 MG tablet Take 1 tablet (75 mg total) by mouth daily. 90 tablet 3   famotidine (PEPCID) 40 MG tablet Take 40 mg by mouth 2 (two) times daily.     fluticasone (FLONASE) 50 MCG/ACT nasal spray Place 1 spray into both nostrils daily. 11.1 mL 6   Fluticasone-Salmeterol (ADVAIR) 250-50 MCG/DOSE AEPB Inhale 1 puff into the lungs 2 (two) times daily.      Glucagon, rDNA, (GLUCAGON EMERGENCY) 1 MG KIT Inject 1 mg as directed as needed. for low blood sugar episodes 1 kit 11   Insulin Glargine (BASAGLAR KWIKPEN) 100 UNIT/ML Inject 32 Units into the skin daily. 9 mL 11   loperamide (IMODIUM) 2 MG capsule Take 2 mg by mouth as needed for diarrhea or loose stools.     metoprolol tartrate (LOPRESSOR) 25 MG tablet Take 1 tablet (25 mg total) by mouth 2 (two) times daily. 180 tablet 3   montelukast (SINGULAIR) 10 MG tablet Take 10 mg by mouth at bedtime.     Multiple Vitamin (MULTIVITAMIN WITH MINERALS) TABS Take 1 tablet by mouth daily. Unknown strength     nitroGLYCERIN (NITROSTAT) 0.4 MG SL tablet PLACE 1 TABLET UNDER THE TONGUE EVERY 5 MINUTES X 3 DOSES AS NEEDED FOR CHEST PAIN. MAX3TAB/15MIN 25 tablet 3   pantoprazole (PROTONIX) 40 MG tablet Take 1 tablet (40 mg total) by mouth daily. 30 tablet 3   VENTOLIN HFA 108 (90 Base) MCG/ACT inhaler Inhale 2 puffs into the lungs every 4 (four) hours as needed for wheezing or shortness of breath.     insulin aspart (NOVOLOG) 100 UNIT/ML injection Inject 10-15 Units into the skin 4 (four) times daily. Sliding scale     No facility-administered medications prior to visit.   Allergies  Allergen Reactions   Amoxicillin Itching   Latex Hives   Penicillins Hives, Itching and Swelling    Did it involve swelling of the face/tongue/throat, SOB, or low BP? No Did it involve sudden or severe rash/hives, skin peeling, or any reaction on the inside of your mouth or nose? No Did you need to seek medical attention at a hospital or doctor's office? No When did it last happen?      Childhood If all above answers are "NO", may proceed with cephalosporin use.   Prednisone Itching and Swelling    Objective:   Today's Vitals   08/08/20 0904  BP: 106/68  Pulse: 82  Temp: (!) 97.3 F (36.3 C)  TempSrc: Temporal  SpO2: 98%  Weight: 136 lb 12.8 oz (62.1 kg)  Height: 5' 3.5" (1.613 m)   Body mass index is 23.85  kg/m.   General: Well developed, well nourished. No acute distress. HEENT: Normocephalic, non-traumatic. Mucous membranes moist. There is mild mucous streaking of the   posterior oropharynx, but tonsils are normal and th are no visible masses. There is a darkly pigmented   patch on the reflexion between the right hard palate and the right pretonsillar pillar. Good dentition. Neck: Supple. No lymphadenopathy. There is a general fullness to the lower neck, but no palpable thyroid   nodularity. Lungs: Clear to auscultation bilaterally. No wheezing, rales or rhonchi. Skin: Warm and dry. There is a 2 cm patch of induration and  superficial excoriation on the right   anterolateral neck. No vesicles are present. Psych: Alert and oriented. Normal mood and affect.  Health Maintenance Due  Topic Date Due   Pneumococcal Vaccine 51-65 Years old (1 - PCV) Never done   FOOT EXAM  Never done   OPHTHALMOLOGY EXAM  Never done   Hepatitis C Screening  Never done   PAP SMEAR-Modifier  Never done   COVID-19 Vaccine (3 - Booster for Moderna series) 06/04/2020     Assessment & Plan:   1. Chronic sore throat Ms. Savino has had a persistent sore throat for ~ 3 months. Treatment focused on her allergic rhinitis and acid reflux have not resolved her symptoms. She does have a smoking history. I recommend referral to ENT for a possible nasolaryngoscopy to rule-out other causes. The neck fullness does raise concern for a possible goiter, so would consider a thyroid ultrasound, should the pharynx/larynx appear normal.  - Ambulatory referral to ENT  2. Allergic dermatitis The rash appears to be a localized allergic reaction. I recommend use of a topical steroid until resolved.  - triamcinolone cream (KENALOG) 0.1 %; Apply 1 application topically 2 (two) times daily.  Dispense: 30 g; Refill: 0  Haydee Salter, MD

## 2020-09-07 ENCOUNTER — Other Ambulatory Visit: Payer: Self-pay | Admitting: Cardiology

## 2020-09-07 DIAGNOSIS — I2510 Atherosclerotic heart disease of native coronary artery without angina pectoris: Secondary | ICD-10-CM

## 2020-09-12 ENCOUNTER — Emergency Department (HOSPITAL_COMMUNITY)
Admission: EM | Admit: 2020-09-12 | Discharge: 2020-09-12 | Disposition: A | Discharge status: Home / Self Care | Payer: Medicare Other | Attending: Emergency Medicine | Admitting: Emergency Medicine

## 2020-09-12 DIAGNOSIS — R220 Localized swelling, mass and lump, head: Secondary | ICD-10-CM | POA: Diagnosis not present

## 2020-09-12 DIAGNOSIS — R221 Localized swelling, mass and lump, neck: Secondary | ICD-10-CM | POA: Diagnosis not present

## 2020-09-12 DIAGNOSIS — Z5321 Procedure and treatment not carried out due to patient leaving prior to being seen by health care provider: Secondary | ICD-10-CM | POA: Insufficient documentation

## 2020-09-12 NOTE — ED Notes (Signed)
Pt states she is going to try another hospital

## 2020-09-14 ENCOUNTER — Ambulatory Visit (INDEPENDENT_AMBULATORY_CARE_PROVIDER_SITE_OTHER): Payer: Medicare Other | Admitting: Otolaryngology

## 2020-09-15 ENCOUNTER — Telehealth: Payer: Self-pay | Admitting: Family Medicine

## 2020-09-15 NOTE — Telephone Encounter (Signed)
Pt called and said that she was seen in the ER and they told her to call her PCP and have him put in an order for an MRI because they couldn't figure out why she has swelling in her neck, please advise

## 2020-09-15 NOTE — Telephone Encounter (Signed)
Spoke to patient, advised she would need an appt and scheduled her for 09/19/20 @2 :30. Dm/cma

## 2020-09-19 ENCOUNTER — Ambulatory Visit (INDEPENDENT_AMBULATORY_CARE_PROVIDER_SITE_OTHER): Payer: Medicare Other | Admitting: Family Medicine

## 2020-09-19 ENCOUNTER — Other Ambulatory Visit: Payer: Self-pay

## 2020-09-19 VITALS — BP 110/68 | HR 89 | Temp 97.3°F | Ht 63.5 in | Wt 138.0 lb

## 2020-09-19 DIAGNOSIS — I2510 Atherosclerotic heart disease of native coronary artery without angina pectoris: Secondary | ICD-10-CM | POA: Diagnosis not present

## 2020-09-19 DIAGNOSIS — K111 Hypertrophy of salivary gland: Secondary | ICD-10-CM

## 2020-09-19 DIAGNOSIS — Z114 Encounter for screening for human immunodeficiency virus [HIV]: Secondary | ICD-10-CM | POA: Diagnosis not present

## 2020-09-19 DIAGNOSIS — F319 Bipolar disorder, unspecified: Secondary | ICD-10-CM

## 2020-09-19 DIAGNOSIS — E1040 Type 1 diabetes mellitus with diabetic neuropathy, unspecified: Secondary | ICD-10-CM | POA: Diagnosis not present

## 2020-09-19 LAB — SEDIMENTATION RATE: Sed Rate: 11 mm/hr (ref 0–20)

## 2020-09-19 NOTE — Progress Notes (Signed)
[facility] [address] [place] [address] Dept: [phone] Dept Fax: [phone]  Office Visit  Subjective:    Patient ID: Melody Myers, female    DOB: 22-Sep-1980, 40 y.o..   MRN: [id number]  Chief Complaint  Patient presents with   Hospitalization [facility] f/u for neck pain on 7/13 &7/18.       History of Present Illness:  Patient is in today for ER follow-up. Ms. Weinrich was seen on 7/13 at [facility] ED with a feeling of swelling in her neck and worried about her airway potentially closing off. She had multiple tests to evaluate, including CBC, Strep test, Mono screen, CMP, and a TSH. Her blood sugar was in the 300s, but otherwise, the testing was pretty normal. She notes she was treated with a course of antibiotics for a sinus infection. I had seen her on 6/14 with a month-long complaint of a sore throat. This followed on an UC visit from Mid-May where she was treated with an antibiotic, apparently for bronchitis. She was referred by me to ENT, but missed the first scheduled appointment and is now scheduled for 8/2.  Ms. Conger continues to note swelling and discomfort of the submandibular area. She states this gradually increases in size during the day, but then often goes down at night. She is left with a feeling like it is difficult to swallow or that her airway may cut off. She stopped taking her Zyrtec and has resumes taking a half a tab of Benadryl each day. She does admit to often feeling like she has a dry mouth and has increased thirst.  Ms. Shutters has a history of Type 1 DM. It has not been well controlled. Her blood sugars fluctuate considerably. She is scheduled to see the endocrinologist next month to assess for getting back on an insulin pump.  Ms. Schnepf has a history of bipolar depression and apparently anxiety. She notes that when she goes to the ER, she has been repeatedly advised to see  someone in regards to anxiety, which apparently is viewed as to be a driver for many of her complaints of physical symptoms. Ms. Crouse admits that she does have some anxiety, but also recognizes she has had legitimate health issues, such as her prior MI. She notes that she was previously managed on a mood stabilizer and several other meds related to her bipolar disease and her anxiety, but this was stopped around the time of her MI.  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   Bipolar depression ([place]) 03/20/2013   Chronic pain 03/20/2013   Tobacco use disorder 03/20/2013   Diabetic neuropathy ([place])    Type 1 diabetes mellitus with diabetic neuropathy, unspecified ([place])    DKA (diabetic ketoacidoses) 03/19/2013   Asthma    Neuropathy    Carpal tunnel syndrome 02/04/2013   Past Surgical History:  Procedure Laterality Date   CORONARY STENT INTERVENTION N/A 10/02/2018   Procedure: CORONARY STENT INTERVENTION;  Surgeon: Nelva Bush, MD;  Location: [facility];  Service: Cardiovascular;  Laterality: N/A;   LEFT HEART CATH AND CORONARY ANGIOGRAPHY N/A 10/02/2018   Procedure: LEFT HEART CATH AND CORONARY ANGIOGRAPHY;  Surgeon: Nelva Bush, MD;  Location: [facility];  Service: Cardiovascular;  Laterality: N/A;   TUBAL LIGATION     VAGINA RECONSTRUCTION SURGERY  10/2014   & ana, reconstruction   Family History  Problem Relation Age of Onset   Heart attack Father 34   Heart attack  Brother 89   Heart disease Brother    COPD Paternal Grandmother    Outpatient Medications Prior to Visit  Medication Sig Dispense Refill   acetaminophen (TYLENOL) 325 MG tablet Take 650 mg by mouth every 6 (six) hours as needed for moderate pain or headache.      albuterol (PROVENTIL) (2.5 MG/3ML) 0.083% nebulizer solution Take 2.5 mg by nebulization as needed for wheezing or shortness of breath.     aspirin EC 81 MG EC tablet Take 1 tablet (81 mg total) by mouth daily.     atorvastatin (LIPITOR) 80 MG tablet Take 1 tablet (80 mg total) by mouth daily at 6 PM. 90 tablet 3   BREO ELLIPTA 100-25 MCG/INH AEPB Inhale 1 puff into the lungs daily. 28 each 11   clopidogrel (PLAVIX) 75 MG tablet TAKE 1 TABLET BY MOUTH EVERY DAY 90 tablet 2   doxycycline (VIBRA-TABS) 100 MG tablet Take 100 mg by mouth 2 (two) times daily.     famotidine (PEPCID) 40 MG tablet Take 40 mg by mouth 2 (two) times daily.     fluticasone (FLONASE) 50 MCG/ACT nasal spray Place 1 spray into both nostrils daily. 11.1 mL 6   Fluticasone-Salmeterol (ADVAIR) 250-50 MCG/DOSE AEPB Inhale 1 puff into the lungs 2 (two) times daily.     Glucagon, rDNA, (GLUCAGON EMERGENCY) 1 MG KIT Inject 1 mg as directed as needed. for low blood sugar episodes 1 kit 11   insulin aspart (NOVOLOG) 100 UNIT/ML injection Inject 10-15 Units into the skin 4 (four) times daily. Sliding scale     Insulin Glargine (BASAGLAR KWIKPEN) 100 UNIT/ML Inject 32 Units into the skin daily. 9 mL 11   loperamide (IMODIUM) 2 MG capsule Take 2 mg by mouth as needed for diarrhea or loose stools.     metoprolol tartrate (LOPRESSOR) 25 MG tablet Take 1 tablet (25 mg total) by mouth 2 (two) times daily. 180 tablet 3   montelukast (SINGULAIR) 10 MG tablet Take 10 mg by mouth at bedtime.     Multiple Vitamin (MULTIVITAMIN WITH MINERALS) TABS Take 1 tablet by mouth daily. Unknown strength     nitroGLYCERIN (NITROSTAT) 0.4 MG SL tablet PLACE 1 TABLET UNDER THE TONGUE EVERY 5  MINUTES X 3 DOSES AS NEEDED FOR CHEST PAIN. MAX3TAB/15MIN 25 tablet 3   pantoprazole (PROTONIX) 40 MG tablet Take 1 tablet (40 mg total) by mouth daily. 30 tablet 3   triamcinolone cream (KENALOG) 0.1 % Apply 1 application topically 2 (two) times daily. 30 g 0   VENTOLIN HFA 108 (90 Base) MCG/ACT inhaler Inhale 2 puffs into the lungs every 4 (four) hours as needed for wheezing or shortness of breath.     cetirizine (ZYRTEC) 10 MG tablet Take 1 tablet (10 mg total) by mouth daily. (Patient not taking: Reported on 09/19/2020) 30 tablet 11   No facility-administered medications prior to visit.   Allergies  Allergen Reactions   Amoxicillin Itching   Latex Hives   Penicillins Hives, Itching  and Swelling    Did it involve swelling of the face/tongue/throat, SOB, or low BP? No Did it involve sudden or severe rash/hives, skin peeling, or any reaction on the inside of your mouth or nose? No Did you need to seek medical attention at a hospital or doctor's office? No When did it last happen?      Childhood If all above answers are "NO", may proceed with cephalosporin use.   Prednisone Itching and Swelling   Objective:   Today's Vitals   09/19/20 1423  BP: 110/68  Pulse: 89  Temp: (!) 97.3 F (36.3 C)  TempSrc: Temporal  SpO2: 96%  Weight: 138 lb (62.6 kg)  Height: 5' 3.5" (1.613 m)   Body mass index is 24.06 kg/m.   General: Well developed, well nourished. No acute distress. HEENT: Normocephalic, non-traumatic. It is unclear if the submandibular glands are more prominent or not. Oropharynx is   clear. Voice is moderately hoarse. Psych: Alert and oriented. Mild to moderate psychomotor agitation noted.  Health Maintenance Due  Topic Date Due   FOOT EXAM  Never done   OPHTHALMOLOGY EXAM  Never done   Hepatitis C Screening  Never done   PAP SMEAR-Modifier  Never done   COVID-19 Vaccine (3 - Booster for Moderna series) 06/04/2020     Assessment & Plan:   1. Enlarged salivary  gland I reviewed the ER notes and lab results. Ms. Roland has a history of Type 1 DM (an autoimmune disorder). Additionally, she complains of salivary gland enlargement and dry mouth. I feel we should do some evaluation for possible Sjgren's syndrome. I will order labs and also a salivary gland ultrasound to assess. She is scheduled to see an ENT next week, as I do feel she needs to have her larynx looked at, in light of the chronic hoarseness and sore throat. I will follow up with her in early Sept.  - Sjogrens syndrome-A extractable nuclear antibody - Sjogrens syndrome-B extractable nuclear antibody - ANA - Rheumatoid factor - Sedimentation rate - HIV Antibody (routine testing w rflx) - HCV Ab w Reflex to Quant PCR - US Soft Tissue Head/Neck (NON-THYROID); Future  2. Screening for HIV (human immunodeficiency virus) Will include this as part of the assessment for swollen glands.  - HIV Antibody (routine testing w rflx)  3. Type 1 diabetes mellitus with diabetic neuropathy, unspecified ([place]) Scheduled to see endocrinologist in August.  4. Coronary artery disease involving native coronary artery of native heart without angina pectoris Scheduled to see cardiologist in Aug.  5. Bipolar depression ([place]) I will refer her to psychiatry to hopefully get her re-established on appropriate medication for managing her bipolar disorder and addressing her anxiety features.  - Ambulatory referral to Psychiatry  Haydee Salter, MD

## 2020-09-20 ENCOUNTER — Telehealth: Payer: Self-pay | Admitting: Family Medicine

## 2020-09-20 ENCOUNTER — Encounter (HOSPITAL_COMMUNITY): Payer: Self-pay | Admitting: *Deleted

## 2020-09-20 ENCOUNTER — Other Ambulatory Visit: Payer: Self-pay

## 2020-09-20 ENCOUNTER — Emergency Department (HOSPITAL_COMMUNITY)
Admission: EM | Admit: 2020-09-20 | Discharge: 2020-09-21 | Disposition: A | Discharge status: Home / Self Care | Payer: Medicare Other | Attending: Emergency Medicine | Admitting: Emergency Medicine

## 2020-09-20 DIAGNOSIS — M542 Cervicalgia: Secondary | ICD-10-CM | POA: Diagnosis not present

## 2020-09-20 DIAGNOSIS — J029 Acute pharyngitis, unspecified: Secondary | ICD-10-CM | POA: Diagnosis present

## 2020-09-20 DIAGNOSIS — Z5321 Procedure and treatment not carried out due to patient leaving prior to being seen by health care provider: Secondary | ICD-10-CM | POA: Diagnosis not present

## 2020-09-20 LAB — HCV INTERPRETATION

## 2020-09-20 LAB — HCV AB W REFLEX TO QUANT PCR: HCV Ab: <0.1 s/co ratio (ref 0.0–0.9)

## 2020-09-20 MED ORDER — DIPHENHYDRAMINE HCL 25 MG PO CAPS: 25.0000 mg | ORAL_CAPSULE | Freq: Once | ORAL | Status: DC

## 2020-09-20 MED ORDER — FAMOTIDINE 20 MG PO TABS: 20.0000 mg | ORAL_TABLET | Freq: Once | ORAL | Status: DC

## 2020-09-20 NOTE — ED Provider Notes (Signed)
Emergency Medicine Provider Triage Evaluation Note  Melody Myers , a 40 y.o. female  was evaluated in triage.  Pt complains of presents with sore throat and feeling as if she is suffocating, patient states this is gone for last 2 months, she states that the symptoms come and go, during the day she feels that her neck is swelling up that at nighttime it goes away, she has no associated rash, GI symptoms, tongue or throat swelling.  She states that she has been seen at [facility] as well as her PCP and they cannot figure out the issue.  She is processing a ENT doctor the next 2 days for reevaluation.  She has been on antibiotics, and steroids without much relief.  .  Review of Systems  Positive: Swelling in her neck, neck pain Negative: Shortness of breath, abdominal pain  Physical Exam  BP 116/80 (BP Location: Left Arm)   Pulse (!) 102   Temp 97.8 F (36.6 C) (Oral)   Resp 20   SpO2 100%  Gen:   Awake, no distress   Resp:  Normal effort  MSK:   Moves extremities without difficulty  Other:  Lung sounds were clear bilaterally, no wheezing or stridor present, oropharynx was visualized tongue uvula are both midline, patient is controlling oral secretions.  Medical Decision Making  Medically screening exam initiated at 8:40 PM.  Appropriate orders placed.  Melody Myers was informed that the remainder of the evaluation will be completed by another provider, this initial triage assessment does not replace that evaluation, and the importance of remaining in the ED until their evaluation is complete.  Presents with neck pain and neck swelling patient need further work-up.   Melody Sage, PA-C 09/20/20 2042    Margarita Grizzle, MD 09/23/20 [phone]

## 2020-09-20 NOTE — Telephone Encounter (Signed)
Spoke to patient and advised on information that was documented on labs.  Dm/cma

## 2020-09-20 NOTE — ED Triage Notes (Addendum)
Pt reports for two months she has had knots in her neck, feels like swelling in her tongue and throat...comes and goes. Pain in throat and radiates up through the ears. OTC meds for symptoms. Nausea. Pt has been seen and evaluated for the same several times. Due to see ent tomorrow.

## 2020-09-20 NOTE — ED Notes (Signed)
Pt stated she is seeing her provider in the morning, advised pt to return if symptoms worsen.

## 2020-09-20 NOTE — Telephone Encounter (Signed)
Pt would like cb concerning her most recent lab results. Please advise pt at [phone].

## 2020-09-21 ENCOUNTER — Telehealth: Payer: Self-pay | Admitting: Cardiology

## 2020-09-21 LAB — ANA: Anti Nuclear Antibody (ANA): NEGATIVE

## 2020-09-21 LAB — RHEUMATOID FACTOR: Rheumatoid fact SerPl-aCnc: <14 IU/mL (ref <14)

## 2020-09-21 LAB — SJOGRENS SYNDROME-B EXTRACTABLE NUCLEAR ANTIBODY: SSB (La) (ENA) Antibody, IgG: <1 AI

## 2020-09-21 LAB — SJOGRENS SYNDROME-A EXTRACTABLE NUCLEAR ANTIBODY: SSA (Ro) (ENA) Antibody, IgG: <1 AI

## 2020-09-21 LAB — HIV ANTIBODY (ROUTINE TESTING W REFLEX): HIV 1&2 Ab, 4th Generation: NONREACTIVE

## 2020-09-21 NOTE — Telephone Encounter (Signed)
Pt c/o of Chest Pain: STAT if CP now or developed within 24 hours  1. Are you having CP right now? yes  2. Are you experiencing any other symptoms (ex. SOB, nausea, vomiting, sweating)? Sob, sweating and cold  3. How long have you been experiencing CP? 2 weeks  4. Is your CP continuous or coming and going? Constantly now  5. Have you taken Nitroglycerin? no ?

## 2020-09-21 NOTE — Telephone Encounter (Signed)
Called patient back. She states that she has been having chest pains coming and going the last two weeks but it has been worse yesterday and today. She did take a nitroglycerin about 30 minutes ago. She states that she is still having the pains but she did not take another dose as she could not remember the instructions. Patient was on the phone SOB difficult to understand due to not being able to catch her breath. She was seen in ED yesterday for neck pain, but patient states she is just not feeling well at all. I did advise for her to call 911 and be evaluated and go to ED. Patient verbalized understanding, advised I would route a message to [facility] and his nurse to follow.

## 2020-09-21 NOTE — Telephone Encounter (Signed)
Follow Up:      I called [facility] Triage extensions several times, they were busy,, I sent t he message as a [employer]

## 2020-09-22 ENCOUNTER — Ambulatory Visit
Admission: RE | Admit: 2020-09-22 | Discharge: 2020-09-22 | Disposition: A | Discharge status: Home / Self Care | Payer: Medicare Other | Source: Ambulatory Visit | Attending: Family Medicine | Admitting: Family Medicine

## 2020-09-22 ENCOUNTER — Other Ambulatory Visit: Payer: Self-pay

## 2020-09-22 DIAGNOSIS — K111 Hypertrophy of salivary gland: Secondary | ICD-10-CM

## 2020-09-26 ENCOUNTER — Ambulatory Visit (INDEPENDENT_AMBULATORY_CARE_PROVIDER_SITE_OTHER): Payer: Medicare Other | Admitting: Otolaryngology

## 2020-09-26 ENCOUNTER — Other Ambulatory Visit: Payer: Self-pay

## 2020-09-26 DIAGNOSIS — I2510 Atherosclerotic heart disease of native coronary artery without angina pectoris: Secondary | ICD-10-CM

## 2020-09-26 DIAGNOSIS — M26609 Unspecified temporomandibular joint disorder, unspecified side: Secondary | ICD-10-CM | POA: Diagnosis not present

## 2020-09-26 DIAGNOSIS — J310 Chronic rhinitis: Secondary | ICD-10-CM | POA: Diagnosis not present

## 2020-09-26 MED ORDER — FLUTICASONE PROPIONATE 50 MCG/ACT NA SUSP: 2.0000 | Freq: Every day | NASAL | 6 refills | Status: DC

## 2020-09-26 NOTE — Progress Notes (Signed)
HPI: Melody Myers is a 40 y.o. female who presents is referred by her PCP for evaluation of multiple throat and neck complaints.  She states that she has intermittent swelling in her neck although she has had no swelling today.  She also has intermittent sore throat and difficulty swallowing although she sees gastroenterology Dr. Truman Hayward in [facility] and has had previous dilation of her esophagus.  She has some nasal congestion and describes some pain and discomfort in her ears. She has a lot of anxiety and is under a lot of stress presently..  Past Medical History:  Diagnosis Date   Anxiety    Asthma    Bipolar 1 disorder ([place])    Bipolar depression ([place]) 03/20/2013   Carpal tunnel syndrome 02/04/2013   Formatting of this note might be different from the original. 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 Repaired at [facility] in Sept 2016.  Pt states that all problems are still present.  If followed by [facility] for this. She has f/u appt.  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 10/14/2018   Degenerative disc disease, lumbar 06/05/2015   Formatting of this note might be different from the original. L4-5 > L3-4, mild   Depression    patient reports bipolor   Depression    Diabetic neuropathy ([place])    DKA (diabetic ketoacidoses) 03/19/2013   History of adenomatous polyp of colon 04/17/2015   Formatting of this note might be different from the original. Per path 2016   Hyperlipidemia LDL goal <70 10/04/2018   Hypokalemia 11/15/2014   Ingrown toenail of both feet 04/12/2019   Formatting of this note might be different from the original. great toenails bilateral   Insulin dependent diabetes mellitus with complications    insulin dependant   Jaw pain 02/24/2020    Leukocytosis 03/20/2013   Myocardial infarction ([place]) 10/02/2018   Neuropathy    Non-ST elevation (NSTEMI) myocardial infarction ([place]) 10/02/2018   NSTEMI (non-ST elevated myocardial infarction) ([place]) 10/02/2018   Superficial incisional surgical site infection 11/14/2014   Syncopal episodes    Tobacco abuse 03/20/2013   Urinary retention 11/14/2014   Past Surgical History:  Procedure Laterality Date   CORONARY STENT INTERVENTION N/A 10/02/2018   Procedure: CORONARY STENT INTERVENTION;  Surgeon: Nelva Bush, MD;  Location: [facility];  Service: Cardiovascular;  Laterality: N/A;   LEFT HEART CATH AND CORONARY ANGIOGRAPHY N/A 10/02/2018   Procedure: LEFT HEART CATH AND CORONARY ANGIOGRAPHY;  Surgeon: Nelva Bush, MD;  Location: [facility];  Service: Cardiovascular;  Laterality: N/A;   TUBAL LIGATION     VAGINA RECONSTRUCTION SURGERY  10/2014   & ana, reconstruction   Social History   Socioeconomic History   Marital status: Widowed    Spouse name: Not on file   Number of children: 2   Years of education: Not on file   Highest education level: Not on file  Occupational History   Occupation: Disabled  Tobacco Use   Smoking status: Every Day    Packs/day: 0.25    Types: Cigarettes   Smokeless tobacco: Never  Vaping Use   Vaping Use: Never used  Substance and Sexual Activity   Alcohol use: Yes    Comment: occasional   Drug use:  No   Sexual activity: Yes    Comment: tubal ligation  Other Topics Concern   Not on file  Social History Narrative   Not on file   Social Determinants of Health   Financial Resource Strain: Not on file  Food Insecurity: Not on file  Transportation Needs: Not on file  Physical Activity: Not on file  Stress: Not on file  Social Connections: Not on file   Family History  Problem Relation Age of Onset   Heart attack Father 71   Heart attack Brother 29   Heart disease Brother    COPD Paternal Grandmother    Allergies  Allergen  Reactions   Amoxicillin Itching   Latex Hives   Penicillins Hives, Itching and Swelling    Did it involve swelling of the face/tongue/throat, SOB, or low BP? No Did it involve sudden or severe rash/hives, skin peeling, or any reaction on the inside of your mouth or nose? No Did you need to seek medical attention at a hospital or doctor's office? No When did it last happen?      Childhood If all above answers are "NO", may proceed with cephalosporin use.   Prednisone Itching and Swelling   Prior to Admission medications   Medication Sig Start Date End Date Taking? Authorizing Provider  acetaminophen (TYLENOL) 325 MG tablet Take 650 mg by mouth every 6 (six) hours as needed for moderate pain or headache.     [provider]  albuterol (PROVENTIL) (2.5 MG/3ML) 0.083% nebulizer solution Take 2.5 mg by nebulization as needed for wheezing or shortness of breath. 11/08/19   [provider]  aspirin EC 81 MG EC tablet Take 1 tablet (81 mg total) by mouth daily. 10/05/18   Almyra Deforest, PA  atorvastatin (LIPITOR) 80 MG tablet Take 1 tablet (80 mg total) by mouth daily at 6 PM. 07/05/20   Park Liter, MD  BREO ELLIPTA 100-25 MCG/INH AEPB Inhale 1 puff into the lungs daily. 07/12/20   Haydee Salter, MD  clopidogrel (PLAVIX) 75 MG tablet TAKE 1 TABLET BY MOUTH EVERY DAY 09/07/20   Park Liter, MD  doxycycline (VIBRA-TABS) 100 MG tablet Take 100 mg by mouth 2 (two) times daily. 05/30/20   [provider]  famotidine (PEPCID) 40 MG tablet Take 40 mg by mouth 2 (two) times daily. 10/06/19   [provider]  fluticasone (FLONASE) 50 MCG/ACT nasal spray Place 1 spray into both nostrils daily. 07/12/20   Haydee Salter, MD  Fluticasone-Salmeterol (ADVAIR) 250-50 MCG/DOSE AEPB Inhale 1 puff into the lungs 2 (two) times daily. 04/10/18   [provider]  Glucagon, rDNA, (GLUCAGON EMERGENCY) 1 MG KIT Inject 1 mg as directed as needed. for low blood sugar episodes  07/12/20   Haydee Salter, MD  insulin aspart (NOVOLOG) 100 UNIT/ML injection Inject 10-15 Units into the skin 4 (four) times daily. Sliding scale    [provider]  Insulin Glargine (BASAGLAR KWIKPEN) 100 UNIT/ML Inject 32 Units into the skin daily. 07/12/20   Haydee Salter, MD  loperamide (IMODIUM) 2 MG capsule Take 2 mg by mouth as needed for diarrhea or loose stools.    [provider]  metoprolol tartrate (LOPRESSOR) 25 MG tablet Take 1 tablet (25 mg total) by mouth 2 (two) times daily. 07/05/20   Park Liter, MD  montelukast (SINGULAIR) 10 MG tablet Take 10 mg by mouth at bedtime. 02/14/20   [provider]  Multiple Vitamin (MULTIVITAMIN WITH  MINERALS) TABS Take 1 tablet by mouth daily. Unknown strength    [provider]  nitroGLYCERIN (NITROSTAT) 0.4 MG SL tablet PLACE 1 TABLET UNDER THE TONGUE EVERY 5 MINUTES X 3 DOSES AS NEEDED FOR CHEST PAIN. MAX3TAB/15MIN 07/05/20   Park Liter, MD  pantoprazole (PROTONIX) 40 MG tablet Take 1 tablet (40 mg total) by mouth daily. 07/12/20   Haydee Salter, MD  triamcinolone cream (KENALOG) 0.1 % Apply 1 application topically 2 (two) times daily. 08/08/20   Haydee Salter, MD  VENTOLIN HFA 108 [phone] Base) MCG/ACT inhaler Inhale 2 puffs into the lungs every 4 (four) hours as needed for wheezing or shortness of breath. 04/07/18   [provider]     Positive ROS: Otherwise negative  All other systems have been reviewed and were otherwise negative with the exception of those mentioned in the HPI and as above.  Physical Exam: Constitutional: Alert, well-appearing, no acute distress Ears: External ears without lesions or tenderness. Ear canals are clear bilaterally.  Both TMs are clear with good mobility on pneumatic otoscopy and no middle ear effusion noted.  Of note she has pain in the TMJ areas on both sides I suspect this is causing some of her ear discomfort. Nasal: External nose without  lesions. Septum mildly deformed to the right with moderate rhinitis.  After decongesting the nose both millimeters regions were clear and no polyps or signs of infection noted..  Oral: Lips and gums without lesions. Tongue and palate mucosa without lesions. Posterior oropharynx clear.  Tonsils are symmetric with no exudate. Indirect laryngoscopy revealed a clear base of tongue vallecula and epiglottis.  Vocal cords were clear bilaterally.  Piriform sinuses were clear bilaterally. Neck: No palpable adenopathy or masses.  There is no significant palpable adenopathy in the neck.  The area that she says swells occasionally is the right submandibular gland and this is normal to palpation on exam today.  Drainage from the submandibular duct is clear with no palpable swelling or evidence of blockage of the duct. Respiratory: Breathing comfortably  Skin: No facial/neck lesions or rash noted.  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  She does have moderate rhinitis and recommended regular use of Flonase 2 sprays each nostril at night and sent this prescription to her pharmacy. She will follow-up if she develops any persistent swelling in her neck. Concerning her complaints of dysphagia with follow-up with her GI doctor.   Radene Journey, MD   CC:

## 2020-10-04 DIAGNOSIS — Z720 Tobacco use: Secondary | ICD-10-CM

## 2020-10-04 DIAGNOSIS — E785 Hyperlipidemia, unspecified: Secondary | ICD-10-CM

## 2020-10-04 DIAGNOSIS — E109 Type 1 diabetes mellitus without complications: Secondary | ICD-10-CM

## 2020-10-04 DIAGNOSIS — I2510 Atherosclerotic heart disease of native coronary artery without angina pectoris: Secondary | ICD-10-CM

## 2020-10-04 DIAGNOSIS — R0789 Other chest pain: Secondary | ICD-10-CM

## 2020-10-06 ENCOUNTER — Ambulatory Visit (INDEPENDENT_AMBULATORY_CARE_PROVIDER_SITE_OTHER): Payer: Medicare Other

## 2020-10-06 ENCOUNTER — Ambulatory Visit (INDEPENDENT_AMBULATORY_CARE_PROVIDER_SITE_OTHER): Payer: Medicare Other | Admitting: Cardiology

## 2020-10-06 ENCOUNTER — Other Ambulatory Visit: Payer: Self-pay

## 2020-10-06 VITALS — BP 102/70 | HR 90 | Ht 64.0 in | Wt 135.0 lb

## 2020-10-06 DIAGNOSIS — E785 Hyperlipidemia, unspecified: Secondary | ICD-10-CM

## 2020-10-06 DIAGNOSIS — J4540 Moderate persistent asthma, uncomplicated: Secondary | ICD-10-CM | POA: Diagnosis not present

## 2020-10-06 DIAGNOSIS — I2510 Atherosclerotic heart disease of native coronary artery without angina pectoris: Secondary | ICD-10-CM | POA: Diagnosis not present

## 2020-10-06 DIAGNOSIS — E1040 Type 1 diabetes mellitus with diabetic neuropathy, unspecified: Secondary | ICD-10-CM

## 2020-10-06 DIAGNOSIS — R0602 Shortness of breath: Secondary | ICD-10-CM

## 2020-10-06 DIAGNOSIS — R002 Palpitations: Secondary | ICD-10-CM

## 2020-10-06 NOTE — Progress Notes (Signed)
Cardiology Office Note:    Date:  10/06/2020   ID:  [name], DOB [birth year], MRN [id number]  PCP:  Haydee Salter, MD  Cardiologist:  Jenne Campus, MD    Referring MD: Haydee Salter, MD   Chief Complaint  Patient presents with   Chest Injury   Tachycardia   Shortness of Breath    History of Present Illness:    Melody Myers is a 40 y.o. female   with past medical history significant for coronary artery disease, in August 2020 required PTCA and stenting of the optimal marginal branch.  At the same time she was found to have diffuse moderate disease in other vessels.  Her past medical history is also significant for diabetes which is poorly controlled, dyslipidemia, she continues smokes. She comes today 2 months for follow-up she is not feeling well she complain of having some shortness of breath.  She actually went a few times to the emergency room.  She also complained of having some atypical chest pain pain is worse by pressing the chest wall.  She was given some nonsteroidal anti-inflammatory medication but not much response to it.  On top of that she complained of having some palpitations.  What she mean by that is her heart speeding up that happens sometimes in the middle of the night and sometimes in the middle of the day.  Still smoke but very few cigarettes because of shortness of breath  Past Medical History:  Diagnosis Date   Anxiety    Asthma    Bipolar 1 disorder ([place])    Bipolar depression ([place]) 03/20/2013   Carpal tunnel syndrome 02/04/2013   Formatting of this note might be different from the original. IMPRESSION: Order right CTI 50277- ASAP   Chest pain 12/24/2018   Chronic pain 03/20/2013   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   Formatting of this note might be different from the original. Repaired at [facility] in Sept 2016.  Pt states that all problems are still present.  If followed by [facility] for this. She has f/u appt.  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 10/14/2018   Degenerative disc disease, lumbar 06/05/2015   Formatting of this note might be different from the original. L4-5 > L3-4, mild   Depression    patient reports bipolor   Depression    Diabetic neuropathy ([place])    DKA (diabetic ketoacidoses) 03/19/2013   History of adenomatous polyp of colon 04/17/2015   Formatting of this note might be different from the original. Per path 2016   Hyperlipidemia LDL goal <70 10/04/2018   Hypokalemia 11/15/2014   Ingrown toenail of both feet 04/12/2019   Formatting of this note might be different from the original. great toenails bilateral   Insulin dependent diabetes mellitus with complications    insulin dependant   Jaw pain 02/24/2020   Leukocytosis 03/20/2013   Myocardial infarction ([place]) 10/02/2018   Neuropathy    Non-ST elevation (NSTEMI) myocardial infarction ([place]) 10/02/2018   NSTEMI (non-ST elevated myocardial infarction) ([place]) 10/02/2018   Superficial incisional surgical site infection 11/14/2014   Syncopal episodes    Tobacco abuse 03/20/2013   Urinary retention 11/14/2014    Past Surgical History:  Procedure Laterality Date   CORONARY STENT INTERVENTION N/A 10/02/2018   Procedure: CORONARY STENT INTERVENTION;  Surgeon: Nelva Bush, MD;  Location: [facility];  Service: Cardiovascular;  Laterality: N/A;   LEFT  HEART CATH AND CORONARY ANGIOGRAPHY N/A 10/02/2018   Procedure: LEFT HEART CATH AND CORONARY ANGIOGRAPHY;  Surgeon: Nelva Bush, MD;  Location: [facility];  Service: Cardiovascular;  Laterality: N/A;   TUBAL LIGATION     VAGINA RECONSTRUCTION SURGERY  10/2014   & ana, reconstruction    Current Medications: Current Meds  Medication Sig   acetaminophen (TYLENOL) 325 MG tablet Take 650 mg by mouth every 6 (six) hours as needed for moderate pain or headache.    albuterol (PROVENTIL) (2.5 MG/3ML) 0.083% nebulizer solution  Take 2.5 mg by nebulization as needed for wheezing or shortness of breath.   aspirin EC 81 MG EC tablet Take 1 tablet (81 mg total) by mouth daily.   atorvastatin (LIPITOR) 80 MG tablet Take 1 tablet (80 mg total) by mouth daily at 6 PM.   BREO ELLIPTA 100-25 MCG/INH AEPB Inhale 1 puff into the lungs daily.   clopidogrel (PLAVIX) 75 MG tablet TAKE 1 TABLET BY MOUTH EVERY DAY (Patient taking differently: Take 75 mg by mouth daily.)   doxycycline (VIBRA-TABS) 100 MG tablet Take 100 mg by mouth 2 (two) times daily.   famotidine (PEPCID) 40 MG tablet Take 40 mg by mouth 2 (two) times daily.   fluticasone (FLONASE) 50 MCG/ACT nasal spray Place 1 spray into both nostrils daily.   fluticasone (FLONASE) 50 MCG/ACT nasal spray Place 2 sprays into both nostrils daily. 2 sprays each nostril at night   Fluticasone-Salmeterol (ADVAIR) 250-50 MCG/DOSE AEPB Inhale 1 puff into the lungs 2 (two) times daily.   Glucagon, rDNA, (GLUCAGON EMERGENCY) 1 MG KIT Inject 1 mg as directed as needed. for low blood sugar episodes   ibuprofen (ADVIL) 600 MG tablet Take 600 mg by mouth every 6 (six) hours as needed for pain.   insulin aspart (NOVOLOG) 100 UNIT/ML injection Inject 10-15 Units into the skin 4 (four) times daily. Sliding scale   Insulin Glargine (BASAGLAR KWIKPEN) 100 UNIT/ML Inject 32 Units into the skin daily.   isosorbide mononitrate (IMDUR) 30 MG 24 hr tablet Take 30 mg by mouth daily.   loperamide (IMODIUM) 2 MG capsule Take 2 mg by mouth as needed for diarrhea or loose stools.   metoprolol tartrate (LOPRESSOR) 25 MG tablet Take 1 tablet (25 mg total) by mouth 2 (two) times daily.   montelukast (SINGULAIR) 10 MG tablet Take 10 mg by mouth at bedtime.   Multiple Vitamin (MULTIVITAMIN WITH MINERALS) TABS Take 1 tablet by mouth daily. Unknown strength   nitroGLYCERIN (NITROSTAT) 0.4 MG SL tablet PLACE 1 TABLET UNDER THE TONGUE EVERY 5 MINUTES X 3 DOSES AS NEEDED FOR CHEST PAIN. MAX3TAB/15MIN (Patient taking  differently: Place 0.4 mg under the tongue every 5 (five) minutes as needed for chest pain. PLACE 1 TABLET UNDER THE TONGUE EVERY 5 MINUTES X 3 DOSES AS NEEDED FOR CHEST PAIN. MAX3TAB/15MIN)   pantoprazole (PROTONIX) 40 MG tablet Take 1 tablet (40 mg total) by mouth daily.   triamcinolone cream (KENALOG) 0.1 % Apply 1 application topically 2 (two) times daily.   VENTOLIN HFA 108 (90 Base) MCG/ACT inhaler Inhale 2 puffs into the lungs every 4 (four) hours as needed for wheezing or shortness of breath.     Allergies:   Amoxicillin, Penicillins, and Prednisone   Social History   Socioeconomic History   Marital status: Widowed    Spouse name: Not on file   Number of children: 2   Years of education: Not on file   Highest education level: Not  on file  Occupational History   Occupation: Disabled  Tobacco Use   Smoking status: Every Day    Packs/day: 0.25    Types: Cigarettes   Smokeless tobacco: Never  Vaping Use   Vaping Use: Never used  Substance and Sexual Activity   Alcohol use: Yes    Comment: occasional   Drug use: No   Sexual activity: Yes    Comment: tubal ligation  Other Topics Concern   Not on file  Social History Narrative   Not on file   Social Determinants of Health   Financial Resource Strain: Not on file  Food Insecurity: Not on file  Transportation Needs: Not on file  Physical Activity: Not on file  Stress: Not on file  Social Connections: Not on file     Family History: The patient's family history includes COPD in her paternal grandmother; Heart attack (age of onset: 72) in her brother; Heart attack (age of onset: 84) in her father; Heart disease in her brother. ROS:   Please see the history of present illness.    All 14 point review of systems negative except as described per history of present illness  EKGs/Labs/Other Studies Reviewed:      Recent Labs: 07/12/2020: ALT 15; BUN 10; Creatinine, Ser 0.71; Hemoglobin 15.3; Platelets 248.0; Potassium  3.6; Sodium 139  Recent Lipid Panel    Component Value Date/Time   CHOL 100 06/19/2020 0844   TRIG 66 06/19/2020 0844   HDL 40 06/19/2020 0844   CHOLHDL 2.5 06/19/2020 0844   LDLCALC 46 06/19/2020 0844    Physical Exam:    VS:  BP 102/70 (BP Location: Right Arm, Patient Position: Sitting)   Pulse 90   Ht '5\' 4"'  (1.626 m)   Wt 135 lb (61.2 kg)   SpO2 99%   BMI 23.17 kg/m     Wt Readings from Last 3 Encounters:  10/06/20 135 lb (61.2 kg)  09/19/20 138 lb (62.6 kg)  08/08/20 136 lb 12.8 oz (62.1 kg)     GEN:  Well nourished, well developed in no acute distress HEENT: Normal NECK: No JVD; No carotid bruits LYMPHATICS: No lymphadenopathy CARDIAC: RRR, no murmurs, no rubs, no gallops RESPIRATORY:  Clear to auscultation without rales, wheezing or rhonchi  ABDOMEN: Soft, non-tender, non-distended MUSCULOSKELETAL:  No edema; No deformity  SKIN: Warm and dry LOWER EXTREMITIES: no swelling NEUROLOGIC:  Alert and oriented x 3 PSYCHIATRIC:  Normal affect   ASSESSMENT:    1. Coronary artery disease involving native coronary artery of native heart without angina pectoris   2. Moderate persistent asthma without complication   3. Type 1 diabetes mellitus with diabetic neuropathy, unspecified ([place])   4. Hyperlipidemia LDL goal <70    PLAN:    In order of problems listed above:  Coronary artery disease she does have some shortness of breath is a completely different sensation that she got when she got her stent in 2020.  We will continue dual antiplatelet therapy. Asthma which could be contributing to her symptomatology I do feel some wheezes on my physical physical exam. Type 2 diabetes having followed by antimedicine team. Smoking likely only 1 cigarette every 4 days.  I stressed importance of not doing it.  Hopefully she will comply. Dyspnea on exertion exertion: I will schedule her to have echocardiogram done. Palpitations: We will do a Zio patch.   Medication  Adjustments/Labs and Tests Ordered: Current medicines are reviewed at length with the patient today.  Concerns regarding medicines  are outlined above.  No orders of the defined types were placed in this encounter.  Medication changes: No orders of the defined types were placed in this encounter.   Signed, Park Liter, MD, [facility] 10/06/2020 10:50 AM    [place]

## 2020-10-06 NOTE — Progress Notes (Signed)
Kg   

## 2020-10-06 NOTE — Patient Instructions (Signed)
Medication Instructions:  Your physician recommends that you continue on your current medications as directed. Please refer to the Current Medication list given to you today.  *If you need a refill on your cardiac medications before your next appointment, please call your pharmacy*   Lab Work: None If you have labs (blood work) drawn today and your tests are completely normal, you will receive your results only by: MyChart Message (if you have MyChart) OR A paper copy in the mail If you have any lab test that is abnormal or we need to change your treatment, we will call you to review the results.   Testing/Procedures: Your physician has requested that you have an echocardiogram. Echocardiography is a painless test that uses sound waves to create images of your heart. It provides your doctor with information about the size and shape of your heart and how well your heart's chambers and valves are working. This procedure takes approximately one hour. There are no restrictions for this procedure.  A zio monitor was ordered today. It will remain on for 7 days. You will then return monitor and event diary in provided box. It takes 1-2 weeks for report to be downloaded and returned to us. We will call you with the results. If monitor falls off or has orange flashing light, please call Zio for further instructions.     Follow-Up: At [facility], you and your health needs are our priority.  As part of our continuing mission to provide you with exceptional heart care, we have created designated Provider Care Teams.  These Care Teams include your primary Cardiologist (physician) and Advanced Practice Providers (APPs -  Physician Assistants and Nurse Practitioners) who all work together to provide you with the care you need, when you need it.  We recommend signing up for the patient portal called "MyChart".  Sign up information is provided on this After Visit Summary.  MyChart is used to connect with  patients for Virtual Visits (Telemedicine).  Patients are able to view lab/test results, encounter notes, upcoming appointments, etc.  Non-urgent messages can be sent to your provider as well.   To learn more about what you can do with MyChart, go to [email or web address].    Your next appointment:   2 month(s)  The format for your next appointment:   In Person  Provider:   Robert Krasowski, MD   Other Instructions ZIO  WHY IS MY DOCTOR PRESCRIBING ZIO? The Zio system is proven and trusted by physicians to detect and diagnose irregular heart rhythms -- and has been prescribed to hundreds of thousands of patients.  The FDA has cleared the Zio system to monitor for many different kinds of irregular heart rhythms. In a study, physicians were able to reach a diagnosis 90% of the time with the Zio system1.  You can wear the Zio monitor -- a small, discreet, comfortable patch -- during your normal day-to-day activity, including while you sleep, shower, and exercise, while it records every single heartbeat for analysis.  1Barrett, P., et al. Comparison of 24 Hour Holter Monitoring Versus 14 Day Novel Adhesive Patch Electrocardiographic Monitoring. American Journal of Medicine, 2014.  ZIO VS. HOLTER MONITORING The Zio monitor can be comfortably worn for up to 14 days. Holter monitors can be worn for 24 to 48 hours, limiting the time to record any irregular heart rhythms you may have. Zio is able to capture data for the 51% of patients who have their first symptom-triggered arrhythmia after 48 hours.1    LIVE WITHOUT RESTRICTIONS The Zio ambulatory cardiac monitor is a small, unobtrusive, and water-resistant patch--you might even forget you're wearing it. The Zio monitor records and stores every beat of your heart, whether you're sleeping, working out, or showering. Remove on: August 19th 2022  

## 2020-10-08 DIAGNOSIS — R002 Palpitations: Secondary | ICD-10-CM | POA: Diagnosis not present

## 2020-10-17 ENCOUNTER — Other Ambulatory Visit: Payer: Self-pay

## 2020-10-17 ENCOUNTER — Ambulatory Visit (INDEPENDENT_AMBULATORY_CARE_PROVIDER_SITE_OTHER): Payer: Medicare Other

## 2020-10-17 DIAGNOSIS — R0602 Shortness of breath: Secondary | ICD-10-CM | POA: Diagnosis not present

## 2020-10-17 LAB — ECHOCARDIOGRAM COMPLETE
Area-P 1/2: 5.75 cm2
S' Lateral: 2.4 cm

## 2020-10-19 ENCOUNTER — Encounter: Payer: Self-pay | Admitting: Endocrinology

## 2020-10-19 ENCOUNTER — Ambulatory Visit (INDEPENDENT_AMBULATORY_CARE_PROVIDER_SITE_OTHER): Payer: Medicare Other | Admitting: Endocrinology

## 2020-10-19 ENCOUNTER — Other Ambulatory Visit: Payer: Self-pay

## 2020-10-19 VITALS — BP 110/70 | HR 89 | Ht 64.0 in | Wt 136.2 lb

## 2020-10-19 DIAGNOSIS — I2510 Atherosclerotic heart disease of native coronary artery without angina pectoris: Secondary | ICD-10-CM

## 2020-10-19 DIAGNOSIS — E1040 Type 1 diabetes mellitus with diabetic neuropathy, unspecified: Secondary | ICD-10-CM

## 2020-10-19 LAB — POCT GLYCOSYLATED HEMOGLOBIN (HGB A1C): Hemoglobin A1C: 7.8 % — AB (ref 4.0–5.6)

## 2020-10-19 MED ORDER — BASAGLAR KWIKPEN 100 UNIT/ML ~~LOC~~ SOPN: 30.0000 [IU] | PEN_INJECTOR | Freq: Every day | SUBCUTANEOUS | 11 refills | Status: DC

## 2020-10-19 NOTE — Patient Instructions (Addendum)
good diet and exercise significantly improve the control of your diabetes.  please let me know if you wish to be referred to a dietician.  high blood sugar is very risky to your health.  you should see an eye doctor and dentist every year.  It is very important to get all recommended vaccinations.  Controlling your blood pressure and cholesterol drastically reduces the damage diabetes does to your body.  Those who smoke should quit.  Please discuss these with your doctor.  check your blood sugar 4 times a day.  vary the time of day when you check, between before the 3 meals, and at bedtime.  also check if you have symptoms of your blood sugar being too high or too low.  please keep a record of the readings and bring it to your next appointment here (or you can bring the meter itself).  You can write it on any piece of paper.  please call us sooner if your blood sugar goes below 70, or if most of your readings are over 200.  In view of your medical condition, you should avoid pregnancy until we have decided it is safe.   Please see Bonita Quin, to consider resuming pump, continuous glucose monitor, and sick day management.   Please reduce the basaglar to 30 units daily, and continue the same Novolog.   Please come back for a follow-up appointment in 2 months.

## 2020-10-19 NOTE — Progress Notes (Signed)
Subjective:    Patient ID: Melody Myers, female    DOB: 1980-12-02, 40 y.o.   MRN: [id number]  HPI pt is referred by Dr Gena Fray, for diabetes.  Pt states DM was dx'ed in 1194; it is complicated by CAD, foot ulcer, NPDR, GP, and PPN; she has been on insulin since dx; pt says her diet and exercise are poor; she has never had pancreatitis or pancreatic surgery.  last episode of severe hypoglycemia was 1 month ago.  Last episode of DKA was 2016.  She takes basaglar 32/d and PRN novolog (averages a total of approx 50 units/d).  She has lost 40 lbs x 1 year.  Pt says cbg varies from 31-610.  It is in general lowest in the middle of the night (and sometimes in the afternoon).  She took pump rx 2013-early 2022 (stopped due to gap in endocrinologist).  She says her pump is old.  Pt says she is not at risk for pregnancy.  She has mild hypoglycemia in the middle of the night, most nights.  She takes last dose of Novolog at [facility].    Past Medical History:  Diagnosis Date   Anxiety    Asthma    Bipolar 1 disorder ([place])    Bipolar depression ([place]) 03/20/2013   Carpal tunnel syndrome 02/04/2013   Formatting of this note might be different from the original. 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 Repaired at [facility] in Sept 2016.  Pt states that all problems are still present.  If followed by [facility] for this. She has f/u appt.  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 10/14/2018   Degenerative disc disease, lumbar 06/05/2015   Formatting of this note might be different from the original. L4-5 > L3-4, mild   Depression    patient reports bipolor   Depression    Diabetic neuropathy ([place])    DKA (diabetic ketoacidoses) 03/19/2013   History of adenomatous polyp of colon 04/17/2015    Formatting of this note might be different from the original. Per path 2016   Hyperlipidemia LDL goal <70 10/04/2018   Hypokalemia 11/15/2014   Ingrown toenail of both feet 04/12/2019   Formatting of this note might be different from the original. great toenails bilateral   Insulin dependent diabetes mellitus with complications    insulin dependant   Jaw pain 02/24/2020   Leukocytosis 03/20/2013   Myocardial infarction ([place]) 10/02/2018   Neuropathy    Non-ST elevation (NSTEMI) myocardial infarction ([place]) 10/02/2018   NSTEMI (non-ST elevated myocardial infarction) ([place]) 10/02/2018   Superficial incisional surgical site infection 11/14/2014   Syncopal episodes    Tobacco abuse 03/20/2013   Urinary retention 11/14/2014    Past Surgical History:  Procedure Laterality Date   CORONARY STENT INTERVENTION N/A 10/02/2018   Procedure: CORONARY STENT INTERVENTION;  Surgeon: Nelva Bush, MD;  Location: [place] [facility];  Service: Cardiovascular;  Laterality: N/A;   LEFT HEART CATH AND CORONARY ANGIOGRAPHY N/A 10/02/2018   Procedure: LEFT HEART CATH AND CORONARY ANGIOGRAPHY;  Surgeon: Nelva Bush, MD;  Location: [place] [facility];  Service: Cardiovascular;  Laterality: N/A;   TUBAL LIGATION     VAGINA RECONSTRUCTION SURGERY  10/2014   & ana, reconstruction    Social History  Socioeconomic History   Marital status: Widowed    Spouse name: Not on file   Number of children: 2   Years of education: Not on file   Highest education level: Not on file  Occupational History   Occupation: Disabled  Tobacco Use   Smoking status: Every Day    Packs/day: 0.25    Types: Cigarettes   Smokeless tobacco: Never  Vaping Use   Vaping Use: Never used  Substance and Sexual Activity   Alcohol use: Yes    Comment: occasional   Drug use: No   Sexual activity: Yes    Comment: tubal ligation  Other Topics Concern   Not on file  Social History Narrative   Not on file   Social Determinants of Health    Financial Resource Strain: Not on file  Food Insecurity: Not on file  Transportation Needs: Not on file  Physical Activity: Not on file  Stress: Not on file  Social Connections: Not on file  Intimate Partner Violence: Not on file    Current Outpatient Medications on File Prior to Visit  Medication Sig Dispense Refill   acetaminophen (TYLENOL) 325 MG tablet Take 650 mg by mouth every 6 (six) hours as needed for moderate pain or headache.      albuterol (PROVENTIL) (2.5 MG/3ML) 0.083% nebulizer solution Take 2.5 mg by nebulization as needed for wheezing or shortness of breath.     aspirin EC 81 MG EC tablet Take 1 tablet (81 mg total) by mouth daily.     atorvastatin (LIPITOR) 80 MG tablet Take 1 tablet (80 mg total) by mouth daily at 6 PM. 90 tablet 3   BREO ELLIPTA 100-25 MCG/INH AEPB Inhale 1 puff into the lungs daily. 28 each 11   clopidogrel (PLAVIX) 75 MG tablet TAKE 1 TABLET BY MOUTH EVERY DAY (Patient taking differently: Take 75 mg by mouth daily.) 90 tablet 2   doxycycline (VIBRA-TABS) 100 MG tablet Take 100 mg by mouth 2 (two) times daily.     fluticasone (FLONASE) 50 MCG/ACT nasal spray Place 1 spray into both nostrils daily. 11.1 mL 6   fluticasone (FLONASE) 50 MCG/ACT nasal spray Place 2 sprays into both nostrils daily. 2 sprays each nostril at night 16 g 6   Fluticasone-Salmeterol (ADVAIR) 250-50 MCG/DOSE AEPB Inhale 1 puff into the lungs 2 (two) times daily.     Glucagon, rDNA, (GLUCAGON EMERGENCY) 1 MG KIT Inject 1 mg as directed as needed. for low blood sugar episodes 1 kit 11   insulin aspart (NOVOLOG) 100 UNIT/ML injection Inject 10-15 Units into the skin 4 (four) times daily. Sliding scale     isosorbide mononitrate (IMDUR) 30 MG 24 hr tablet Take 30 mg by mouth daily.     loperamide (IMODIUM) 2 MG capsule Take 2 mg by mouth as needed for diarrhea or loose stools.     metoprolol tartrate (LOPRESSOR) 25 MG tablet Take 1 tablet (25 mg total) by mouth 2 (two) times daily.  180 tablet 3   montelukast (SINGULAIR) 10 MG tablet Take 10 mg by mouth at bedtime.     Multiple Vitamin (MULTIVITAMIN WITH MINERALS) TABS Take 1 tablet by mouth daily. Unknown strength     nitroGLYCERIN (NITROSTAT) 0.4 MG SL tablet PLACE 1 TABLET UNDER THE TONGUE EVERY 5 MINUTES X 3 DOSES AS NEEDED FOR CHEST PAIN. MAX3TAB/15MIN (Patient taking differently: Place 0.4 mg under the tongue every 5 (five) minutes as needed for chest pain. PLACE 1 TABLET UNDER THE TONGUE EVERY  5 MINUTES X 3 DOSES AS NEEDED FOR CHEST PAIN. MAX3TAB/15MIN) 25 tablet 3   triamcinolone cream (KENALOG) 0.1 % Apply 1 application topically 2 (two) times daily. 30 g 0   VENTOLIN HFA 108 (90 Base) MCG/ACT inhaler Inhale 2 puffs into the lungs every 4 (four) hours as needed for wheezing or shortness of breath.     No current facility-administered medications on file prior to visit.    Allergies  Allergen Reactions   Amoxicillin Itching   Penicillins Hives, Itching and Swelling    Did it involve swelling of the face/tongue/throat, SOB, or low BP? No Did it involve sudden or severe rash/hives, skin peeling, or any reaction on the inside of your mouth or nose? No Did you need to seek medical attention at a hospital or doctor's office? No When did it last happen?      Childhood If all above answers are "NO", may proceed with cephalosporin use.   Prednisone Itching and Swelling    Family History  Problem Relation Age of Onset   Heart attack Father 84   Diabetes Sister    Heart attack Brother 29   Heart disease Brother    Diabetes Maternal Grandmother    COPD Paternal Grandmother     BP 110/70 (BP Location: Right Arm, Patient Position: Sitting, Cuff Size: Normal)   Pulse 89   Ht 5' 4" (1.626 m)   Wt 136 lb 3.2 oz (61.8 kg)   SpO2 98%   BMI 23.38 kg/m    Review of Systems No change in chronic depression or nausea.     Objective:   Physical Exam VITAL SIGNS:  See vs page GENERAL: no distress.   Pulses:  dorsalis pedis intact bilat.   MSK: no deformity of the feet CV: no leg edema.   Skin:  healing ulcer at the left heel  normal color and temp on the feet. Neuro: sensation is intact to touch on the feet, but decreased from normal. Ext: there is bilateral onychomycosis of the toenails.    Lab Results  Component Value Date   HGBA1C 7.8 (A) 10/19/2020    Lab Results  Component Value Date   CREATININE 0.71 07/12/2020   BUN 10 07/12/2020   NA 139 07/12/2020   K 3.6 07/12/2020   CL 102 07/12/2020   CO2 27 07/12/2020   I have reviewed outside records, and summarized: Pt was noted to have elevated A1c, and referred here.  She was admitted in 2016 for DKA.  Cause was felt to be multifactorial.      Assessment & Plan:  Type 1 DM Hypoglycemia, due to insulin: this limits aggressiveness of glycemic control.    Patient Instructions  good diet and exercise significantly improve the control of your diabetes.  please let me know if you wish to be referred to a dietician.  high blood sugar is very risky to your health.  you should see an eye doctor and dentist every year.  It is very important to get all recommended vaccinations.  Controlling your blood pressure and cholesterol drastically reduces the damage diabetes does to your body.  Those who smoke should quit.  Please discuss these with your doctor.  check your blood sugar 4 times a day.  vary the time of day when you check, between before the 3 meals, and at bedtime.  also check if you have symptoms of your blood sugar being too high or too low.  please keep a record of the readings  and bring it to your next appointment here (or you can bring the meter itself).  You can write it on any piece of paper.  please call us sooner if your blood sugar goes below 70, or if most of your readings are over 200.  In view of your medical condition, you should avoid pregnancy until we have decided it is safe.   Please see Vaughan Basta, to consider resuming pump,  continuous glucose monitor, and sick day management.   Please reduce the basaglar to 30 units daily, and continue the same Novolog.   Please come back for a follow-up appointment in 2 months.

## 2020-10-20 ENCOUNTER — Telehealth: Payer: Self-pay

## 2020-10-20 NOTE — Telephone Encounter (Signed)
-----   Message from Georgeanna Lea, MD sent at 10/19/2020 10:10 AM EDT ----- Echocardiogram showed preserved left ventricle ejection fraction, overall looks good

## 2020-10-20 NOTE — Telephone Encounter (Signed)
Patient notified of results.

## 2020-10-24 ENCOUNTER — Encounter: Payer: Medicare Other | Attending: Endocrinology | Admitting: Nutrition

## 2020-10-24 ENCOUNTER — Other Ambulatory Visit: Payer: Self-pay | Admitting: Endocrinology

## 2020-10-24 ENCOUNTER — Other Ambulatory Visit: Payer: Self-pay

## 2020-10-24 DIAGNOSIS — E1040 Type 1 diabetes mellitus with diabetic neuropathy, unspecified: Secondary | ICD-10-CM

## 2020-10-24 MED ORDER — DEXCOM G6 RECEIVER DEVI: 1.0000 | Freq: Once | 1 refills | Status: AC

## 2020-10-24 MED ORDER — DEXCOM G6 SENSOR MISC: 1.0000 | 3 refills | Status: DC

## 2020-10-24 MED ORDER — OMNIPOD 5 DEXG7G6 PODS GEN 5 MISC
1.0000 | 3 refills | Status: DC
Start: 2020-10-24 — End: 2020-11-14

## 2020-10-24 MED ORDER — DEXCOM G6 TRANSMITTER MISC: 1.0000 | Freq: Once | 1 refills | Status: AC

## 2020-10-24 NOTE — Patient Instructions (Signed)
Call me when pump, pods and Dexcoms come in to schedule training.

## 2020-10-24 NOTE — Progress Notes (Signed)
Patient is wanting to start the OmniPod pump with dexcom CGM.  OmniPod 5.  She said her Tandem is the old model and would require her to go on line to upgrade and she has no computer at home to do this.  Showed her the pump the OmniPod 5 pump and she does want this.  Note to Dr. Everardo All to order this for her at her Pharmacy.

## 2020-10-27 ENCOUNTER — Other Ambulatory Visit: Payer: Self-pay | Admitting: Endocrinology

## 2020-10-27 ENCOUNTER — Ambulatory Visit (INDEPENDENT_AMBULATORY_CARE_PROVIDER_SITE_OTHER): Payer: Medicare Other | Admitting: Family Medicine

## 2020-10-27 ENCOUNTER — Other Ambulatory Visit: Payer: Self-pay

## 2020-10-27 ENCOUNTER — Encounter: Payer: Self-pay | Admitting: Family Medicine

## 2020-10-27 VITALS — BP 104/70 | HR 91 | Temp 97.7°F | Wt 136.2 lb

## 2020-10-27 DIAGNOSIS — K219 Gastro-esophageal reflux disease without esophagitis: Secondary | ICD-10-CM

## 2020-10-27 DIAGNOSIS — F17200 Nicotine dependence, unspecified, uncomplicated: Secondary | ICD-10-CM

## 2020-10-27 DIAGNOSIS — F319 Bipolar disorder, unspecified: Secondary | ICD-10-CM

## 2020-10-27 DIAGNOSIS — Z23 Encounter for immunization: Secondary | ICD-10-CM

## 2020-10-27 DIAGNOSIS — K259 Gastric ulcer, unspecified as acute or chronic, without hemorrhage or perforation: Secondary | ICD-10-CM | POA: Insufficient documentation

## 2020-10-27 DIAGNOSIS — E1040 Type 1 diabetes mellitus with diabetic neuropathy, unspecified: Secondary | ICD-10-CM | POA: Diagnosis not present

## 2020-10-27 DIAGNOSIS — I2510 Atherosclerotic heart disease of native coronary artery without angina pectoris: Secondary | ICD-10-CM | POA: Diagnosis not present

## 2020-10-27 DIAGNOSIS — I519 Heart disease, unspecified: Secondary | ICD-10-CM | POA: Insufficient documentation

## 2020-10-27 HISTORY — DX: Heart disease, unspecified: I51.9

## 2020-10-27 MED ORDER — OMNIPOD 5 DEXG7G6 INTRO GEN 5 KIT: 1.0000 | PACK | Freq: Once | 0 refills | Status: AC

## 2020-10-27 NOTE — Progress Notes (Signed)
[facility] [address] [place] [address] Dept: [phone] Dept Fax: [phone]  Office Visit  Subjective:    Patient ID: Melody Myers, female    DOB: 1980-07-04, 40 y.o..   MRN: [id number]  Chief Complaint  Patient presents with   Follow-up    6 wk f/u.     History of Present Illness:  Patient is in today for reassessment of her chronic medical conditions.  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 She has not been well controlled. At her last visit, I reconnected her with an endocrinologist Loanne Drilling). They are moving ahead with getting her back on an insulin pump.  Melody Myers has a history of a prior MI. At her last visit, I referred her back to cardiology. She had a recent Echo that showed only mild diastolic dysfunction. She remains on aspirin and Plavix as well as metoprolol. She has a history of hyperlipidemia managed with atorvastatin.  Melody Myers had been having some swallowing issues. She was seen by ENT which did not find any enlargement of her glands. She was also seen by GI for an upper endoscopy. This was normal, except for the findings of a small antral ulcer. She notes they started her on an acid medicine, but can't recall which one.  Melody Myers has been having trouble getting in with a psychiatrist. She notes the clinic she was referred to was not able to help her, as they did not have a psychiatrist available. She recognizes that anxiety is a factor in some of her physical symptoms and wants to have this more stable.  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   Bipolar depression ([place]) 03/20/2013   Chronic pain 03/20/2013   Tobacco use disorder 03/20/2013   Diabetic neuropathy ([place])    Type 1 diabetes mellitus with diabetic neuropathy, unspecified ([place])    DKA (diabetic ketoacidoses) 03/19/2013   Asthma    Neuropathy    Carpal tunnel syndrome 02/04/2013   Past Surgical History:  Procedure Laterality Date   CORONARY STENT INTERVENTION N/A 10/02/2018   Procedure: CORONARY STENT INTERVENTION;  Surgeon: Nelva Bush, MD;  Location: [facility];  Service: Cardiovascular;  Laterality: N/A;   LEFT HEART CATH AND CORONARY ANGIOGRAPHY N/A 10/02/2018   Procedure: LEFT HEART CATH AND CORONARY ANGIOGRAPHY;  Surgeon: Nelva Bush, MD;  Location: [facility];  Service: Cardiovascular;  Laterality: N/A;   TUBAL LIGATION     VAGINA RECONSTRUCTION SURGERY  10/2014   & ana, reconstruction   Family History  Problem Relation Age of Onset   Heart attack Father 66   Diabetes Sister    Heart attack Brother 29   Heart disease Brother    Diabetes Maternal Grandmother    COPD Paternal Grandmother    Outpatient Medications Prior to Visit  Medication Sig Dispense Refill   acetaminophen (TYLENOL) 325 MG tablet Take 650 mg by mouth  every 6 (six) hours as needed for moderate pain or headache.      albuterol (PROVENTIL) (2.5 MG/3ML) 0.083% nebulizer solution Take 2.5 mg by nebulization as needed for wheezing or shortness of breath.     aspirin EC 81 MG EC tablet Take 1 tablet (81 mg total) by mouth daily.     atorvastatin (LIPITOR) 80 MG tablet Take 1 tablet (80 mg total) by mouth daily at 6 PM. 90 tablet 3   BREO ELLIPTA 100-25 MCG/INH AEPB Inhale 1 puff into the lungs daily. 28 each 11    clopidogrel (PLAVIX) 75 MG tablet TAKE 1 TABLET BY MOUTH EVERY DAY (Patient taking differently: Take 75 mg by mouth daily.) 90 tablet 2   Continuous Blood Gluc Sensor (DEXCOM G6 SENSOR) MISC 1 Device by Does not apply route See admin instructions. Change every 10 days 9 each 3   doxycycline (VIBRA-TABS) 100 MG tablet Take 100 mg by mouth 2 (two) times daily.     fluticasone (FLONASE) 50 MCG/ACT nasal spray Place 2 sprays into both nostrils daily. 2 sprays each nostril at night 16 g 6   Fluticasone-Salmeterol (ADVAIR) 250-50 MCG/DOSE AEPB Inhale 1 puff into the lungs 2 (two) times daily.     Glucagon, rDNA, (GLUCAGON EMERGENCY) 1 MG KIT Inject 1 mg as directed as needed. for low blood sugar episodes 1 kit 11   insulin aspart (NOVOLOG) 100 UNIT/ML injection Inject 10-15 Units into the skin 4 (four) times daily. Sliding scale     Insulin Disposable Pump (OMNIPOD 5 G6 INTRO, GEN 5,) KIT 1 Device by Does not apply route once for 1 dose. 1 kit 0   Insulin Disposable Pump (OMNIPOD 5 G6 POD, GEN 5,) MISC 1 Device by Does not apply route every 3 (three) days. 30 each 3   Insulin Glargine (BASAGLAR KWIKPEN) 100 UNIT/ML Inject 30 Units into the skin daily. 9 mL 11   isosorbide mononitrate (IMDUR) 30 MG 24 hr tablet Take 30 mg by mouth daily.     loperamide (IMODIUM) 2 MG capsule Take 2 mg by mouth as needed for diarrhea or loose stools.     metoprolol tartrate (LOPRESSOR) 25 MG tablet Take 1 tablet (25 mg total) by mouth 2 (two) times daily. 180 tablet 3   montelukast (SINGULAIR) 10 MG tablet Take 10 mg by mouth at bedtime.     Multiple Vitamin (MULTIVITAMIN WITH MINERALS) TABS Take 1 tablet by mouth daily. Unknown strength     nitroGLYCERIN (NITROSTAT) 0.4 MG SL tablet PLACE 1 TABLET UNDER THE TONGUE EVERY 5 MINUTES X 3 DOSES AS NEEDED FOR CHEST PAIN. MAX3TAB/15MIN (Patient taking differently: Place 0.4 mg under the tongue every 5 (five) minutes as needed for chest pain. PLACE 1 TABLET UNDER THE TONGUE EVERY 5  MINUTES X 3 DOSES AS NEEDED FOR CHEST PAIN. MAX3TAB/15MIN) 25 tablet 3   triamcinolone cream (KENALOG) 0.1 % Apply 1 application topically 2 (two) times daily. 30 g 0   VENTOLIN HFA 108 (90 Base) MCG/ACT inhaler Inhale 2 puffs into the lungs every 4 (four) hours as needed for wheezing or shortness of breath.     fluticasone (FLONASE) 50 MCG/ACT nasal spray Place 1 spray into both nostrils daily. 11.1 mL 6   No facility-administered medications prior to visit.    Allergies  Allergen Reactions   Amoxicillin Itching   Penicillins Hives, Itching and Swelling    Did it involve swelling of the face/tongue/throat, SOB, or low BP? No Did it involve sudden or  severe rash/hives, skin peeling, or any reaction on the inside of your mouth or nose? No Did you need to seek medical attention at a hospital or doctor's office? No When did it last happen?      Childhood If all above answers are "NO", may proceed with cephalosporin use.   Prednisone Itching and Swelling     Objective:   Today's Vitals   10/27/20 1526  BP: 104/70  Pulse: 91  Temp: 97.7 F (36.5 C)  SpO2: 94%  Weight: 136 lb 3.2 oz (61.8 kg)   Body mass index is 23.38 kg/m.   General: Well developed, well nourished. No acute distress. Psych: Alert and oriented. Normal mood and affect.  Health Maintenance Due  Topic Date Due   OPHTHALMOLOGY EXAM  Never done   PAP SMEAR-Modifier  Never done   COVID-19 Vaccine (3 - Booster for Moderna series) 06/04/2020   INFLUENZA VACCINE  09/25/2020   Lab Results Lab Results  Component Value Date   HGBA1C 7.8 (A) 10/19/2020   Imaging: [place] of Soft Tissues of Head & Neck (09/22/2020) IMPRESSION: Unremarkable sonographic survey of the salivary glands. Typical appearing lymph nodes.  Echocardiogram (10/17/2020) IMPRESSIONS   1. Left ventricular ejection fraction, by estimation, is 60 to 65%. The left ventricle has normal function. The left ventricle has no regional wall motion  abnormalities. Left ventricular diastolic parameters are  consistent with Grade I diastolic dysfunction (impaired relaxation). The average left ventricular global  longitudinal strain is -12.7 %. The global longitudinal strain is abnormal.   2. Right ventricular systolic function is normal. The right ventricular size is normal. There is normal pulmonary artery systolic pressure.   3. The mitral valve is normal in structure. No evidence of mitral valve regurgitation. No evidence of mitral stenosis.   4. The aortic valve is tricuspid. Aortic valve regurgitation is not visualized. No aortic stenosis is present.   5. The inferior vena cava is normal in size with greater than 50% respiratory variability, suggesting right atrial pressure of 3 mmHg.    Assessment & Plan:   1. Type 1 diabetes mellitus with diabetic neuropathy, unspecified (HCC) Continue to follow with Dr. Loanne Drilling regarding restring use of insulin pump (Omnipod).  2. Coronary artery disease involving native coronary artery of native heart without angina pectoris Stable on dual platelet therapy and metorprolol.  3. Gastroesophageal reflux disease without esophagitis I asked Melody Myers to let us know which medication the GI doctor selected, so we can include it on her medication list.  4. Ulcer of pyloric antrum, unspecified chronicity As above.  5. Tobacco use disorder Reinforced recommendation to stop her residual cigarette use. Melody Myers is trying to switch to vaping, but knows she needs to give up nicotine completely.  6. Bipolar depression ([place]) I will renew her pysch referral and ask the team to find a site with a psychiatrist who can manage the psychotherapeutics.  - Ambulatory referral to Psychiatry  7. Need for influenza vaccination  - Flu Vaccine QUAD 6+ mos PF IM (Fluarix Quad PF)  Haydee Salter, MD

## 2020-10-31 ENCOUNTER — Telehealth: Payer: Self-pay | Admitting: Nutrition

## 2020-10-31 NOTE — Telephone Encounter (Signed)
Message left on machine that she has not hear back from OmniPod, or Dexcom. I called her and told her that [employer] was to have texted her, and she was to have responded to that text to call them back.  She reports having gotten that text, but did not call them.  Telephone number given.  Dexcom /[facility] called patient back and will be sending out the supplies in 2 days. Also, patient is saying she is out of Hospital doctor today, and would like the script called in to [facility] in [place].  She is taking 30u qd.

## 2020-11-03 ENCOUNTER — Telehealth: Payer: Self-pay | Admitting: Family Medicine

## 2020-11-03 NOTE — Telephone Encounter (Signed)
Called patient and spoke to her regarding wanting a refill on Imdur that was given at the hospital.   Advised that Dr Veto Kemps isn't in the office this week and will be back on 11/06/20. She has 1 tablet left and said it was given for her "neck tightness" and is okay waiting for him to return to get a response.   Please review and advise.   Thanks. Dm/cma

## 2020-11-03 NOTE — Telephone Encounter (Signed)
Pt requesting refill on isosorbide mononitrate (IMDUR) 30 MG 24 hr tablet [[id number]]

## 2020-11-07 ENCOUNTER — Telehealth: Payer: Self-pay | Admitting: Nutrition

## 2020-11-07 NOTE — Telephone Encounter (Signed)
Patient reports that she has not heard anything about her pump and dexcom. Message left on machine to call [employer] with telephone number given to see what the progress is on this.

## 2020-11-07 NOTE — Telephone Encounter (Signed)
Patient  notified VIA phone and will call the office to schedule an appointment if she feels that it is neccessary later.  Dm/cma

## 2020-11-07 NOTE — Telephone Encounter (Signed)
Lft VM to rtn call. Dm/cma  

## 2020-11-10 ENCOUNTER — Telehealth: Payer: Self-pay | Admitting: Dietician

## 2020-11-10 NOTE — Telephone Encounter (Signed)
Returned patient call. Patient left a message stating that she has not received the Omnipod 5 yet as the pharmacy is waiting on the proper prior authorization form. Patient states that MD signed a prior authorization form, this was the wrong form and [employer] is now waiting on the new prior authorization form.  Called [employer] [phone]) and confirmed this information.  [employer] is to fax the prior authorization form again to our office.  Discussed that this has been forwarded to staff who complete the prior authorization and that she should call back next week if she has not heard any information.  Oran Rein, RD, LDN, CDCES

## 2020-11-13 ENCOUNTER — Other Ambulatory Visit (HOSPITAL_COMMUNITY): Payer: Self-pay

## 2020-11-13 ENCOUNTER — Telehealth: Payer: Self-pay | Admitting: Pharmacy Technician

## 2020-11-13 NOTE — Telephone Encounter (Signed)
Patient Advocate Encounter   Received notification from Dietician that prior authorization for Omnipod is required.   PA submitted on 11/13/20 Key [id number] Status is pending    [facility] will continue to follow:   Sherilyn Dacosta, CPhT Patient Advocate [facility] Phone: [phone] Fax:  [phone]

## 2020-11-14 ENCOUNTER — Other Ambulatory Visit: Payer: Self-pay | Admitting: Endocrinology

## 2020-11-14 MED ORDER — OMNIPOD 5 DEXG7G6 PODS GEN 5 MISC: 1.0000 | 3 refills | Status: DC

## 2020-11-14 NOTE — Telephone Encounter (Signed)
Patient Advocate Encounter  [facility] does not currently carry the Omnipod 5, so I messaged the provider to send the orders to [facility], so they can process it through her Medicare Part B DME benefit.

## 2020-11-14 NOTE — Telephone Encounter (Signed)
Patient Advocate Encounter   Left a VM with [facility] for them to call back. If they can process part B, we can send the Omnipod orders to them, since they are already handling the pt's Dexcom order.

## 2020-11-14 NOTE — Telephone Encounter (Signed)
Called patient and informed her that I called [employer] who stated that the Omnipod 5 was not covered under Medicare part B or part D.  Will message Bonita Quin, CDCES to speak with patient regarding her options.  Oran Rein, RD, LDN, CDCES

## 2020-11-14 NOTE — Telephone Encounter (Signed)
[facility] Patient Advocate Encounter  Received notification from [employer] that the request for prior authorization for Omnipod has been denied due to being non-formulary.     Called to get clarification or alternatives that are on formulary. Because it is a device, it's not covered under her part D prescription coverage and needs to be billed to part B for DME/medical coverage.   Sent msg to provider and clinical staff at the office to follow up with [employer] to help the pt get the Omnipod.

## 2020-11-15 NOTE — Telephone Encounter (Signed)
I have called Mary-the pharm rep. To look into this account.  LVM to call me with any other options to get this for her.

## 2020-11-16 ENCOUNTER — Telehealth: Payer: Self-pay | Admitting: Pharmacy Technician

## 2020-11-16 ENCOUNTER — Telehealth: Payer: Self-pay

## 2020-11-16 NOTE — Telephone Encounter (Signed)
Patient left a message again today re:  Omnipod 5 or other pump status and recommendations.  Spoke with Corrie Dandy from [employer] who will work on her prior authorization and call patient re:  status of order.  Request made with MD to order her pump from the [facility] in [place], [place] (mail order pharmacy).  Oran Rein, RD, LDN, CDCES

## 2020-11-16 NOTE — Telephone Encounter (Signed)
Patient Advocate Encounter   Received notification from [employer] that prior authorization for OMNIPOD PUMP AND PODS is required.   Completed PA questions on [employer]. PA submitted on 11/16/20 Status is pending    [facility] will continue to follow  Montez Morita, CPhT Patient Advocate [facility] Phone: [phone] Fax:  [phone]

## 2020-11-16 NOTE — Telephone Encounter (Signed)
-----   Message from Georgeanna Lea, MD sent at 11/16/2020  1:22 PM EDT ----- Monitor did not show any significant arrhythmia.  Triggered events showed sinus rhythm.  No action needed

## 2020-11-16 NOTE — Telephone Encounter (Signed)
Spoke with patient regarding results and recommendation.  Patient verbalizes understanding and is agreeable to plan of care. Advised patient to call back with any issues or concerns.  

## 2020-11-16 NOTE — Telephone Encounter (Addendum)
Patient Advocate Encounter   Received notification from [facility] that prior authorization for OMNIPOD 5 G6 POD is required.   PA submitted on 11/16/2020 Key [id number] Status is pending    [facility] will continue to follow   Jeannette How, CPhT Patient Advocate [facility] Phone: [phone] Fax:  [phone]

## 2020-11-16 NOTE — Telephone Encounter (Signed)
[facility] Patient Advocate Encounter  Prior Authorization for Omnipod 5 G6 POD has been approved.    PA# Case ID: [id number] Effective dates: 02/26/2020 through 11/16/21   Sherilyn Dacosta, CPhT Patient Advocate [facility] Phone: [phone] Fax:  [phone]

## 2020-11-17 ENCOUNTER — Other Ambulatory Visit: Payer: Self-pay | Admitting: Endocrinology

## 2020-11-17 MED ORDER — OMNIPOD 5 DEXG7G6 INTRO GEN 5 KIT: 1.0000 | PACK | Freq: Once | 0 refills | Status: AC

## 2020-11-17 MED ORDER — OMNIPOD 5 DEXG7G6 PODS GEN 5 MISC: 1.0000 | 3 refills | Status: DC

## 2020-11-18 ENCOUNTER — Ambulatory Visit (INDEPENDENT_AMBULATORY_CARE_PROVIDER_SITE_OTHER): Payer: Medicare Other

## 2020-11-18 DIAGNOSIS — Z0000 Encounter for general adult medical examination without abnormal findings: Secondary | ICD-10-CM | POA: Diagnosis not present

## 2020-11-18 NOTE — Progress Notes (Deleted)
Subjective:   Melody Myers is a 40 y.o. female who presents for an Initial Medicare Annual Wellness Visit.  Review of Systems    ***       Objective:    There were no vitals filed for this visit. There is no height or weight on file to calculate BMI.  Advanced Directives 04/29/2020 12/13/2018 10/31/2018 10/02/2018 11/14/2014 11/24/2013 03/20/2013  Does Patient Have a Medical Advance Directive? No No No No No No Patient does not have advance directive;Patient would not like information  Would patient like information on creating a medical advance directive? No - Patient declined No - Patient declined No - Patient declined No - Patient declined No - patient declined information No - patient declined information -  Pre-existing out of facility DNR order (yellow form or pink MOST form) - - - - - - No    Current Medications (verified) Outpatient Encounter Medications as of 11/18/2020  Medication Sig  . acetaminophen (TYLENOL) 325 MG tablet Take 650 mg by mouth every 6 (six) hours as needed for moderate pain or headache.   . albuterol (PROVENTIL) (2.5 MG/3ML) 0.083% nebulizer solution Take 2.5 mg by nebulization as needed for wheezing or shortness of breath.  Marland Kitchen aspirin EC 81 MG EC tablet Take 1 tablet (81 mg total) by mouth daily.  Marland Kitchen atorvastatin (LIPITOR) 80 MG tablet Take 1 tablet (80 mg total) by mouth daily at 6 PM.  . BREO ELLIPTA 100-25 MCG/INH AEPB Inhale 1 puff into the lungs daily.  . clopidogrel (PLAVIX) 75 MG tablet TAKE 1 TABLET BY MOUTH EVERY DAY (Patient taking differently: Take 75 mg by mouth daily.)  . Continuous Blood Gluc Sensor (DEXCOM G6 SENSOR) MISC 1 Device by Does not apply route See admin instructions. Change every 10 days  . fluticasone (FLONASE) 50 MCG/ACT nasal spray Place 2 sprays into both nostrils daily. 2 sprays each nostril at night  . Fluticasone-Salmeterol (ADVAIR) 250-50 MCG/DOSE AEPB Inhale 1 puff into the lungs 2 (two) times daily.  . Glucagon, rDNA, (GLUCAGON  EMERGENCY) 1 MG KIT Inject 1 mg as directed as needed. for low blood sugar episodes  . insulin aspart (NOVOLOG) 100 UNIT/ML injection Inject 10-15 Units into the skin 4 (four) times daily. Sliding scale  . Insulin Disposable Pump (OMNIPOD 5 G6 POD, GEN 5,) MISC 1 Device by Does not apply route every 3 (three) days.  . Insulin Glargine (BASAGLAR KWIKPEN) 100 UNIT/ML Inject 30 Units into the skin daily.  . isosorbide mononitrate (IMDUR) 30 MG 24 hr tablet Take 30 mg by mouth daily.  Marland Kitchen loperamide (IMODIUM) 2 MG capsule Take 2 mg by mouth as needed for diarrhea or loose stools.  . metoprolol tartrate (LOPRESSOR) 25 MG tablet Take 1 tablet (25 mg total) by mouth 2 (two) times daily.  . montelukast (SINGULAIR) 10 MG tablet Take 10 mg by mouth at bedtime.  . Multiple Vitamin (MULTIVITAMIN WITH MINERALS) TABS Take 1 tablet by mouth daily. Unknown strength  . nitroGLYCERIN (NITROSTAT) 0.4 MG SL tablet PLACE 1 TABLET UNDER THE TONGUE EVERY 5 MINUTES X 3 DOSES AS NEEDED FOR CHEST PAIN. MAX3TAB/15MIN (Patient taking differently: Place 0.4 mg under the tongue every 5 (five) minutes as needed for chest pain. PLACE 1 TABLET UNDER THE TONGUE EVERY 5 MINUTES X 3 DOSES AS NEEDED FOR CHEST PAIN. MAX3TAB/15MIN)  . triamcinolone cream (KENALOG) 0.1 % Apply 1 application topically 2 (two) times daily.  . VENTOLIN HFA 108 (90 Base) MCG/ACT inhaler Inhale 2 puffs into  the lungs every 4 (four) hours as needed for wheezing or shortness of breath.  . doxycycline (VIBRA-TABS) 100 MG tablet Take 100 mg by mouth 2 (two) times daily. (Patient not taking: Reported on 11/18/2020)   No facility-administered encounter medications on file as of 11/18/2020.    Allergies (verified) Amoxicillin, Penicillins, and Prednisone   History: Past Medical History:  Diagnosis Date  . Anxiety   . Asthma   . Bipolar 1 disorder ([place])   . Bipolar depression ([place]) 03/20/2013  . Carpal tunnel syndrome 02/04/2013   Formatting of this note might  be different from the original. IMPRESSION: Order right CTI 35597- ASAP  . Chest pain 12/24/2018  . Chronic pain 03/20/2013  . 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 Repaired at [facility] in Sept 2016.  Pt states that all problems are still present.  If followed by [facility] for this. She has f/u appt.  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  . 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 10/14/2018  . Degenerative disc disease, lumbar 06/05/2015   Formatting of this note might be different from the original. L4-5 > L3-4, mild  . Depression    patient reports bipolor  . Depression   . Diabetic neuropathy ([place])   . DKA (diabetic ketoacidoses) 03/19/2013  . History of adenomatous polyp of colon 04/17/2015   Formatting of this note might be different from the original. Per path 2016  . Hyperlipidemia LDL goal <70 10/04/2018  . Hypokalemia 11/15/2014  . Ingrown toenail of both feet 04/12/2019   Formatting of this note might be different from the original. great toenails bilateral  . Insulin dependent diabetes mellitus with complications    insulin dependant  . Jaw pain 02/24/2020  . Leukocytosis 03/20/2013  . Myocardial infarction ([place]) 10/02/2018  . Neuropathy   . Non-ST elevation (NSTEMI) myocardial infarction ([place]) 10/02/2018  . NSTEMI (non-ST elevated myocardial infarction) ([place]) 10/02/2018  . Superficial incisional surgical site infection 11/14/2014  . Syncopal episodes   . Tobacco abuse 03/20/2013  . Urinary retention 11/14/2014   Past Surgical History:  Procedure Laterality Date  . CORONARY STENT INTERVENTION N/A 10/02/2018   Procedure: CORONARY STENT INTERVENTION;  Surgeon: Nelva Bush, MD;  Location: [place] [facility];  Service: Cardiovascular;  Laterality: N/A;  . LEFT HEART CATH AND CORONARY ANGIOGRAPHY N/A 10/02/2018   Procedure: LEFT HEART CATH AND CORONARY ANGIOGRAPHY;  Surgeon: Nelva Bush, MD;  Location: [facility];  Service: Cardiovascular;  Laterality: N/A;  . TUBAL LIGATION    . VAGINA RECONSTRUCTION SURGERY  10/2014   & ana, reconstruction   Family History  Problem Relation Age of Onset  . Heart attack Father 18  . Diabetes Sister   . Heart attack Brother 27  . Heart disease Brother   . Diabetes Maternal Grandmother   . COPD Paternal Grandmother    Social History   Socioeconomic History  . Marital status: Widowed    Spouse name: Not on file  . Number of children: 2  . Years of education: Not on file  . Highest education level: Not on file  Occupational History  . Occupation: Disabled  Tobacco Use  . Smoking status: Every Day    Packs/day: 0.25    Types: Cigarettes  . Smokeless tobacco: Never  Vaping Use  . Vaping Use: Never used  Substance and Sexual Activity  . Alcohol use: Not Currently  Comment: occasional  . Drug use: No  . Sexual activity: Yes    Comment: tubal ligation  Other Topics Concern  . Not on file  Social History Narrative  . Not on file   Social Determinants of Health   Financial Resource Strain: Not on file  Food Insecurity: Not on file  Transportation Needs: Not on file  Physical Activity: Not on file  Stress: Not on file  Social Connections: Not on file    Tobacco Counseling Ready to quit: Not Answered Counseling given: Not Answered   Clinical Intake:  Pre-visit preparation completed: Yes  Pain : No/denies pain     Nutritional Risks: None Diabetes: Yes  How often do you need to have someone help you when you read instructions, pamphlets, or other written materials from your doctor or pharmacy?: 1 - Never  Diabetic?***  Interpreter Needed?: No  Information entered by :: Armandina Gemma, CMA   Activities of Daily Living No flowsheet data found.  Patient Care Team: Haydee Salter, MD as PCP - General (Family Medicine) Park Liter, MD as Consulting Physician  (Cardiology) Rozetta Nunnery, MD as Consulting Physician (Otolaryngology) Renato Shin, MD as Consulting Physician (Endocrinology) Lake Bells., MD as Referring Physician (Gastroenterology)  Indicate any recent Medical Services you may have received from other than [facility] providers in the past year (date may be approximate).     Assessment:   This is a routine wellness examination for [place].  Hearing/Vision screen No results found.  Dietary issues and exercise activities discussed:     Goals Addressed   None   Depression Screen PHQ 2/9 Scores 11/18/2020 07/12/2020 07/12/2020  PHQ - 2 Score '2 5 3  ' PHQ- 9 Score 7 16 -    Fall Risk Fall Risk  11/18/2020  Falls in the past year? 0  Number falls in past yr: 0  Injury with Fall? 0  Risk for fall due to : No Fall Risks  Follow up Falls evaluation completed    [place]:  Any stairs in or around the home? {YES/NO:21197} If so, are there any without handrails? {YES/NO:21197} Home free of loose throw rugs in walkways, pet beds, electrical cords, etc? {YES/NO:21197} Adequate lighting in your home to reduce risk of falls? {YES/NO:21197}  ASSISTIVE DEVICES UTILIZED TO PREVENT FALLS:  Life alert? {YES/NO:21197} Use of a cane, walker or w/c? {YES/NO:21197} Grab bars in the bathroom? {YES/NO:21197} Shower chair or bench in shower? {YES/NO:21197} Elevated toilet seat or a handicapped toilet? {YES/NO:21197}  TIMED UP AND GO:  Was the test performed? {YES/NO:21197}.  Length of time to ambulate 10 feet: *** sec.   {Appearance of FXJO:8325498}  Cognitive Function:        Immunizations Immunization History  Administered Date(s) Administered  . Influenza,inj,Quad PF,6+ Mos 03/21/2013, 10/27/2020  . Moderna Sars-Covid-2 Vaccination 09/29/2019, 01/05/2020  . Pneumococcal Conjugate-13 05/30/2017  . Pneumococcal Polysaccharide-23 03/21/2013  . Tdap 12/14/2013    {TDAP  status:2101805}  {Flu Vaccine status:2101806}  {Pneumococcal vaccine status:2101807}  {Covid-19 vaccine status:2101808}  Qualifies for Shingles Vaccine? {YES/NO:21197}  Zostavax completed {YES/NO:21197}  {Shingrix Completed?:2101804}  Screening Tests Health Maintenance  Topic Date Due  . OPHTHALMOLOGY EXAM  Never done  . PAP SMEAR-Modifier  Never done  . COVID-19 Vaccine (3 - Booster for Moderna series) 06/04/2020  . HEMOGLOBIN A1C  04/21/2021  . URINE MICROALBUMIN  07/12/2021  . FOOT EXAM  10/19/2021  . TETANUS/TDAP  12/15/2023  . INFLUENZA VACCINE  Completed  .  Hepatitis C Screening  Completed  . HIV Screening  Completed  . HPV VACCINES  Aged Out    Health Maintenance  Health Maintenance Due  Topic Date Due  . OPHTHALMOLOGY EXAM  Never done  . PAP SMEAR-Modifier  Never done  . COVID-19 Vaccine (3 - Booster for Moderna series) 06/04/2020    {Colorectal cancer screening:2101809}  {Mammogram status:21018020}  {Bone Density status:21018021}  Lung Cancer Screening: (Low Dose CT Chest recommended if Age 40-80 years, 30 pack-year currently smoking OR have quit w/in 15years.) {DOES NOT does:27190::"does not"} qualify.   Lung Cancer Screening Referral: ***  Additional Screening:  Hepatitis C Screening: {DOES NOT does:27190::"does not"} qualify; Completed ***  Vision Screening: Recommended annual ophthalmology exams for early detection of glaucoma and other disorders of the eye. Is the patient up to date with their annual eye exam?  {YES/NO:21197} Who is the provider or what is the name of the office in which the patient attends annual eye exams? *** If pt is not established with a provider, would they like to be referred to a provider to establish care? {YES/NO:21197}.   Dental Screening: Recommended annual dental exams for proper oral hygiene  Community Resource Referral / Chronic Care Management: CRR required this visit?  {YES/NO:21197}  CCM required this  visit?  {YES/NO:21197}     Plan:     I have personally reviewed and noted the following in the patient's chart:   Medical and social history Use of alcohol, tobacco or illicit drugs  Current medications and supplements including opioid prescriptions. {Opioid Prescriptions:[phone]} Functional ability and status Nutritional status Physical activity Advanced directives List of other physicians Hospitalizations, surgeries, and ER visits in previous 12 months Vitals Screenings to include cognitive, depression, and falls Referrals and appointments  In addition, I have reviewed and discussed with patient certain preventive protocols, quality metrics, and best practice recommendations. A written personalized care plan for preventive services as well as general preventive health recommendations were provided to patient.     Ival Bible Quinto Tippy, CMA   11/18/2020   Nurse Notes: ***

## 2020-11-18 NOTE — Progress Notes (Signed)
Subjective:   Melody Myers is a 40 y.o. female who presents for Medicare Annual (Subsequent) preventive examination.  I connected with  [name] on 11/18/20 by audio enabled telemedicine application and verified that I am speaking with the correct person using two identifiers.  Full name and DOB.  Location of patient:  home Location of provider:  office Persons participating in visit:   [name], and Armandina Gemma, CMA   I discussed the limitations of evaluation and management by telemedicine. The patient expressed understanding and agreed to proceed.   Review of Systems    Deferred to PCP  Cardiac Risk Factors include: diabetes mellitus     Objective:    There were no vitals filed for this visit. There is no height or weight on file to calculate BMI.  Advanced Directives 04/29/2020 12/13/2018 10/31/2018 10/02/2018 11/14/2014 11/24/2013 03/20/2013  Does Patient Have a Medical Advance Directive? _0  No Patient does not have advance directive;Patient would not like information  Would patient like information on creating a medical advance directive? No - Patient declined No - Patient declined No - Patient declined No - Patient declined No - patient declined information No - patient declined information -  Pre-existing out of facility DNR order (yellow form or pink MOST form) - - - - - - No    Current Medications (verified) Outpatient Encounter Medications as of 11/18/2020  Medication Sig   acetaminophen (TYLENOL) 325 MG tablet Take 650 mg by mouth every 6 (six) hours as needed for moderate pain or headache.    albuterol (PROVENTIL) (2.5 MG/3ML) 0.083% nebulizer solution Take 2.5 mg by nebulization as needed for wheezing or shortness of breath.   aspirin EC 81 MG EC tablet Take 1 tablet (81 mg total) by mouth daily.   atorvastatin (LIPITOR) 80 MG tablet Take 1 tablet (80 mg total) by mouth daily at 6 PM.   BREO ELLIPTA 100-25 MCG/INH AEPB Inhale 1 puff into the lungs  daily.   clopidogrel (PLAVIX) 75 MG tablet TAKE 1 TABLET BY MOUTH EVERY DAY (Patient taking differently: Take 75 mg by mouth daily.)   Continuous Blood Gluc Sensor (DEXCOM G6 SENSOR) MISC 1 Device by Does not apply route See admin instructions. Change every 10 days   fluticasone (FLONASE) 50 MCG/ACT nasal spray Place 2 sprays into both nostrils daily. 2 sprays each nostril at night   Fluticasone-Salmeterol (ADVAIR) 250-50 MCG/DOSE AEPB Inhale 1 puff into the lungs 2 (two) times daily.   Glucagon, rDNA, (GLUCAGON EMERGENCY) 1 MG KIT Inject 1 mg as directed as needed. for low blood sugar episodes   insulin aspart (NOVOLOG) 100 UNIT/ML injection Inject 10-15 Units into the skin 4 (four) times daily. Sliding scale   Insulin Disposable Pump (OMNIPOD 5 G6 POD, GEN 5,) MISC 1 Device by Does not apply route every 3 (three) days.   Insulin Glargine (BASAGLAR KWIKPEN) 100 UNIT/ML Inject 30 Units into the skin daily.   isosorbide mononitrate (IMDUR) 30 MG 24 hr tablet Take 30 mg by mouth daily.   loperamide (IMODIUM) 2 MG capsule Take 2 mg by mouth as needed for diarrhea or loose stools.   metoprolol tartrate (LOPRESSOR) 25 MG tablet Take 1 tablet (25 mg total) by mouth 2 (two) times daily.   montelukast (SINGULAIR) 10 MG tablet Take 10 mg by mouth at bedtime.   Multiple Vitamin (MULTIVITAMIN WITH MINERALS) TABS Take 1 tablet by mouth daily. Unknown strength   nitroGLYCERIN (NITROSTAT) 0.4 MG SL tablet  PLACE 1 TABLET UNDER THE TONGUE EVERY 5 MINUTES X 3 DOSES AS NEEDED FOR CHEST PAIN. MAX3TAB/15MIN (Patient taking differently: Place 0.4 mg under the tongue every 5 (five) minutes as needed for chest pain. PLACE 1 TABLET UNDER THE TONGUE EVERY 5 MINUTES X 3 DOSES AS NEEDED FOR CHEST PAIN. MAX3TAB/15MIN)   triamcinolone cream (KENALOG) 0.1 % Apply 1 application topically 2 (two) times daily.   VENTOLIN HFA 108 (90 Base) MCG/ACT inhaler Inhale 2 puffs into the lungs every 4 (four) hours as needed for wheezing or  shortness of breath.   doxycycline (VIBRA-TABS) 100 MG tablet Take 100 mg by mouth 2 (two) times daily. (Patient not taking: Reported on 11/18/2020)   No facility-administered encounter medications on file as of 11/18/2020.    Allergies (verified) Amoxicillin, Penicillins, and Prednisone   History: Past Medical History:  Diagnosis Date   Anxiety    Asthma    Bipolar 1 disorder ([place])    Bipolar depression ([place]) 03/20/2013   Carpal tunnel syndrome 02/04/2013   Formatting of this note might be different from the original. 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 Repaired at [facility] in Sept 2016.  Pt states that all problems are still present.  If followed by [facility] for this. She has f/u appt.  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 10/14/2018   Degenerative disc disease, lumbar 06/05/2015   Formatting of this note might be different from the original. L4-5 > L3-4, mild   Depression    patient reports bipolor   Depression    Diabetic neuropathy ([place])    DKA (diabetic ketoacidoses) 03/19/2013   History of adenomatous polyp of colon 04/17/2015   Formatting of this note might be different from the original. Per path 2016   Hyperlipidemia LDL goal <70 10/04/2018   Hypokalemia 11/15/2014   Ingrown toenail of both feet 04/12/2019   Formatting of this note might be different from the original. great toenails bilateral   Insulin dependent diabetes mellitus with complications    insulin dependant   Jaw pain 02/24/2020   Leukocytosis 03/20/2013   Myocardial infarction ([place]) 10/02/2018   Neuropathy    Non-ST elevation (NSTEMI) myocardial infarction ([place]) 10/02/2018   NSTEMI (non-ST elevated myocardial infarction) ([place]) 10/02/2018   Superficial incisional surgical site  infection 11/14/2014   Syncopal episodes    Tobacco abuse 03/20/2013   Urinary retention 11/14/2014   Past Surgical History:  Procedure Laterality Date   CORONARY STENT INTERVENTION N/A 10/02/2018   Procedure: CORONARY STENT INTERVENTION;  Surgeon: Nelva Bush, MD;  Location: [place] [facility];  Service: Cardiovascular;  Laterality: N/A;   LEFT HEART CATH AND CORONARY ANGIOGRAPHY N/A 10/02/2018   Procedure: LEFT HEART CATH AND CORONARY ANGIOGRAPHY;  Surgeon: Nelva Bush, MD;  Location: [place] [facility];  Service: Cardiovascular;  Laterality: N/A;   TUBAL LIGATION     VAGINA RECONSTRUCTION SURGERY  10/2014   & ana, reconstruction   Family History  Problem Relation Age of Onset   Heart attack Father 59   Diabetes Sister    Heart attack Brother 71   Heart disease Brother    Diabetes Maternal Grandmother    COPD Paternal Grandmother    Social History   Socioeconomic History   Marital status: Widowed  Spouse name: Not on file   Number of children: 2   Years of education: Not on file   Highest education level: Not on file  Occupational History   Occupation: Disabled  Tobacco Use   Smoking status: Every Day    Packs/day: 0.25    Types: Cigarettes   Smokeless tobacco: Never  Vaping Use   Vaping Use: Never used  Substance and Sexual Activity   Alcohol use: Not Currently    Comment: occasional   Drug use: No   Sexual activity: Yes    Comment: tubal ligation  Other Topics Concern   Not on file  Social History Narrative   Not on file   Social Determinants of Health   Financial Resource Strain: Not on file  Food Insecurity: Not on file  Transportation Needs: Not on file  Physical Activity: Not on file  Stress: Not on file  Social Connections: Not on file    Tobacco Counseling Ready to quit: Not Answered Counseling given: Not Answered   Clinical Intake:  Pre-visit preparation completed: Yes  Pain : No/denies pain     Nutritional Risks:  None Diabetes: Yes  How often do you need to have someone help you when you read instructions, pamphlets, or other written materials from your doctor or pharmacy?: 1 - Never  Diabetic?yes  Interpreter Needed?: No  Information entered by :: Armandina Gemma, [place]   Activities of Daily Living In your present state of health, do you have any difficulty performing the following activities: 11/18/2020  Hearing? Y  Vision? N  Difficulty concentrating or making decisions? Y  Comment concentrating  Walking or climbing stairs? Y  Comment SOB when climbing stairs  Dressing or bathing? N  Doing errands, shopping? N  Preparing Food and eating ? N  Using the Toilet? N  In the past six months, have you accidently leaked urine? Y  Do you have problems with loss of bowel control? Y  Managing your Medications? N  Managing your Finances? N  Housekeeping or managing your Housekeeping? N  Some recent data might be hidden    Patient Care Team: Haydee Salter, MD as PCP - General (Family Medicine) Park Liter, MD as Consulting Physician (Cardiology) Rozetta Nunnery, MD as Consulting Physician (Otolaryngology) Renato Shin, MD as Consulting Physician (Endocrinology) Lake Bells., MD as Referring Physician (Gastroenterology)  Indicate any recent Medical Services you may have received from other than [facility] providers in the past year (date may be approximate).     Assessment:   This is a routine wellness examination for Melody Myers.  Hearing/Vision screen No results found.  Dietary issues and exercise activities discussed: Current Exercise Habits: Home exercise routine, Type of exercise: stretching, Time (Minutes): 30, Frequency (Times/Week): 7, Weekly Exercise (Minutes/Week): 210, Intensity: Mild, Exercise limited by: None identified   Goals Addressed   None    Depression Screen PHQ 2/9 Scores 11/18/2020 07/12/2020 07/12/2020  PHQ - 2 Score _0 PHQ- 9 Score 7 16 -    Fall  Risk Fall Risk  11/18/2020  Falls in the past year? 0  Number falls in past yr: 0  Injury with Fall? 0  Risk for fall due to : No Fall Risks  Follow up Falls evaluation completed    [place]:  Any stairs in or around the home? Yes , has a ramp to enter home If so, are there any without handrails? Yes  Home free of  loose throw rugs in walkways, pet beds, electrical cords, etc? Yes  Adequate lighting in your home to reduce risk of falls? Yes   ASSISTIVE DEVICES UTILIZED TO PREVENT FALLS:  Life alert? No  Use of a cane, walker or w/c? Yes , cane Grab bars in the bathroom? Yes  Shower chair or bench in shower? No  Elevated toilet seat or a handicapped toilet? No   TIMED UP AND GO:  Was the test performed? n/a Length of time to ambulate 10 feet: n/a    Cognitive Function:     6CIT Screen 11/18/2020  What Year? 0 points  What month? 0 points  What time? 0 points  Count back from 20 0 points  Months in reverse 0 points  Repeat phrase 0 points  Total Score 0    Immunizations Immunization History  Administered Date(s) Administered   Influenza,inj,Quad PF,6+ Mos 03/21/2013, 10/27/2020   Moderna Sars-Covid-2 Vaccination 09/29/2019, 01/05/2020   Pneumococcal Conjugate-13 05/30/2017   Pneumococcal Polysaccharide-23 03/21/2013   Tdap 12/14/2013    TDAP status: Up to date  Flu Vaccine status: Up to date  Pneumococcal vaccine status: Up to date  Covid-19 vaccine status: Completed vaccines  Qualifies for Shingles Vaccine? Yes   Zostavax completed No   Shingrix Completed?: No.    Education has been provided regarding the importance of this vaccine. Patient has been advised to call insurance company to determine out of pocket expense if they have not yet received this vaccine. Advised may also receive vaccine at local pharmacy or Health Dept. Verbalized acceptance and understanding.  Screening Tests Health Maintenance  Topic Date Due    OPHTHALMOLOGY EXAM  Never done   PAP SMEAR-Modifier  Never done   COVID-19 Vaccine (3 - Booster for Moderna series) 06/04/2020   HEMOGLOBIN A1C  04/21/2021   URINE MICROALBUMIN  07/12/2021   FOOT EXAM  10/19/2021   TETANUS/TDAP  12/15/2023   INFLUENZA VACCINE  Completed   Hepatitis C Screening  Completed   HIV Screening  Completed   HPV VACCINES  Aged Out    Health Maintenance  Health Maintenance Due  Topic Date Due   OPHTHALMOLOGY EXAM  Never done   PAP SMEAR-Modifier  Never done   COVID-19 Vaccine (3 - Booster for Moderna series) 06/04/2020    Colorectal cancer screening: Type of screening: Colonoscopy. Completed 2022. Repeat every 2 years  Mammogram status: No longer required due to hasn't had one yet.  Bone Density:  N/A  Lung Cancer Screening: (Low Dose CT Chest recommended if Age 90-80 years, 30 pack-year currently smoking OR have quit w/in 15years.) not qualify.   Lung Cancer Screening Referral:N/A  Additional Screening:  Hepatitis C Screening: does not qualify  Vision Screening: Recommended annual ophthalmology exams for early detection of glaucoma and other disorders of the eye. Is the patient up to date with their annual eye exam?  Yes  Who is the provider or what is the name of the office in which the patient attends annual eye exams? Dr Hulan Saas If pt is not established with a provider, would they like to be referred to a provider to establish care? No .   Dental Screening: Recommended annual dental exams for proper oral hygiene  Community Resource Referral / Chronic Care Management: CRR required this visit?  No   CCM required this visit?  No      Plan:     I have personally reviewed and noted the following in the patient's chart:   Medical  and social history Use of alcohol, tobacco or illicit drugs  Current medications and supplements including opioid prescriptions.  Functional ability and status Nutritional status Physical  activity Advanced directives List of other physicians Hospitalizations, surgeries, and ER visits in previous 12 months Vitals Screenings to include cognitive, depression, and falls Referrals and appointments  In addition, I have reviewed and discussed with patient certain preventive protocols, quality metrics, and best practice recommendations. A written personalized care plan for preventive services as well as general preventive health recommendations were provided to patient.     Konrad Saha, [place]   11/18/2020   Nurse Notes: none face to face 30 mins encounter

## 2020-11-18 NOTE — Patient Instructions (Signed)
Health Maintenance, Female Adopting a healthy lifestyle and getting preventive care are important in promoting health and wellness. Ask your health care provider about: The right schedule for you to have regular tests and exams. Things you can do on your own to prevent diseases and keep yourself healthy. What should I know about diet, weight, and exercise? Eat a healthy diet  Eat a diet that includes plenty of vegetables, fruits, low-fat dairy products, and lean protein. Do not eat a lot of foods that are high in solid fats, added sugars, or sodium. Maintain a healthy weight Body mass index (BMI) is used to identify weight problems. It estimates body fat based on height and weight. Your health care provider can help determine your BMI and help you achieve or maintain a healthy weight. Get regular exercise Get regular exercise. This is one of the most important things you can do for your health. Most adults should: Exercise for at least 150 minutes each week. The exercise should increase your heart rate and make you sweat (moderate-intensity exercise). Do strengthening exercises at least twice a week. This is in addition to the moderate-intensity exercise. Spend less time sitting. Even light physical activity can be beneficial. Watch cholesterol and blood lipids Have your blood tested for lipids and cholesterol at 38 years of age, then have this test every 5 years. Have your cholesterol levels checked more often if: Your lipid or cholesterol levels are high. You are older than 38 years of age. You are at high risk for heart disease. What should I know about cancer screening? Depending on your health history and family history, you may need to have cancer screening at various ages. This may include screening for: Breast cancer. Cervical cancer. Colorectal cancer. Skin cancer. Lung cancer. What should I know about heart disease, diabetes, and high blood pressure? Blood pressure and heart  disease High blood pressure causes heart disease and increases the risk of stroke. This is more likely to develop in people who have high blood pressure readings, are of African descent, or are overweight. Have your blood pressure checked: Every 3-5 years if you are 18-39 years of age. Every year if you are 40 years old or older. Diabetes Have regular diabetes screenings. This checks your fasting blood sugar level. Have the screening done: Once every three years after age 40 if you are at a normal weight and have a low risk for diabetes. More often and at a younger age if you are overweight or have a high risk for diabetes. What should I know about preventing infection? Hepatitis B If you have a higher risk for hepatitis B, you should be screened for this virus. Talk with your health care provider to find out if you are at risk for hepatitis B infection. Hepatitis C Testing is recommended for: Everyone born from 1945 through 1965. Anyone with known risk factors for hepatitis C. Sexually transmitted infections (STIs) Get screened for STIs, including gonorrhea and chlamydia, if: You are sexually active and are younger than 38 years of age. You are older than 38 years of age and your health care provider tells you that you are at risk for this type of infection. Your sexual activity has changed since you were last screened, and you are at increased risk for chlamydia or gonorrhea. Ask your health care provider if you are at risk. Ask your health care provider about whether you are at high risk for HIV. Your health care provider may recommend a prescription medicine   to help prevent HIV infection. If you choose to take medicine to prevent HIV, you should first get tested for HIV. You should then be tested every 3 months for as long as you are taking the medicine. Pregnancy If you are about to stop having your period (premenopausal) and you may become pregnant, seek counseling before you get  pregnant. Take 400 to 800 micrograms (mcg) of folic acid every day if you become pregnant. Ask for birth control (contraception) if you want to prevent pregnancy. Osteoporosis and menopause Osteoporosis is a disease in which the bones lose minerals and strength with aging. This can result in bone fractures. If you are 65 years old or older, or if you are at risk for osteoporosis and fractures, ask your health care provider if you should: Be screened for bone loss. Take a calcium or vitamin D supplement to lower your risk of fractures. Be given hormone replacement therapy (HRT) to treat symptoms of menopause. Follow these instructions at home: Lifestyle Do not use any products that contain nicotine or tobacco, such as cigarettes, e-cigarettes, and chewing tobacco. If you need help quitting, ask your health care provider. Do not use street drugs. Do not share needles. Ask your health care provider for help if you need support or information about quitting drugs. Alcohol use Do not drink alcohol if: Your health care provider tells you not to drink. You are pregnant, may be pregnant, or are planning to become pregnant. If you drink alcohol: Limit how much you use to 0-1 drink a day. Limit intake if you are breastfeeding. Be aware of how much alcohol is in your drink. In the [place] one drink equals one 12 oz bottle of beer (355 mL), one 5 oz glass of wine (148 mL), or one 1 oz glass of hard liquor (44 mL). General instructions Schedule regular health, dental, and eye exams. Stay current with your vaccines. Tell your health care provider if: You often feel depressed. You have ever been abused or do not feel safe at home. Summary Adopting a healthy lifestyle and getting preventive care are important in promoting health and wellness. Follow your health care provider's instructions about healthy diet, exercising, and getting tested or screened for diseases. Follow your health care provider's  instructions on monitoring your cholesterol and blood pressure. This information is not intended to replace advice given to you by your health care provider. Make sure you discuss any questions you have with your health care provider. Document Revised: 04/21/2020 Document Reviewed: 02/04/2018 Elsevier Patient Education  2022 Elsevier Inc.  

## 2020-11-20 ENCOUNTER — Other Ambulatory Visit (HOSPITAL_COMMUNITY): Payer: Self-pay

## 2020-11-22 ENCOUNTER — Telehealth: Payer: Self-pay | Admitting: Nutrition

## 2020-11-22 NOTE — Telephone Encounter (Signed)
Patient reports that she got a denial from Medicare for her pump and said it was because information was not put in correctly.  Says she will fax the form to [place] with the required information needed by them. I gave her the fax number.  Please be on the lookout for this.  Pt. Very anxious to get this pump.

## 2020-11-28 ENCOUNTER — Telehealth: Payer: Self-pay | Admitting: Nutrition

## 2020-11-28 ENCOUNTER — Other Ambulatory Visit: Payer: Self-pay | Admitting: Endocrinology

## 2020-11-28 MED ORDER — DEXCOM G6 SENSOR MISC: 1.0000 | 3 refills | Status: DC

## 2020-11-28 MED ORDER — DEXCOM G6 TRANSMITTER MISC: 1.0000 | Freq: Once | 1 refills | Status: AC

## 2020-11-28 NOTE — Telephone Encounter (Signed)
Melody Myers reports that she got her pump, but no Dexcom.  Please order from her [facility] pharmacy.  Thank you

## 2020-12-05 ENCOUNTER — Telehealth: Payer: Self-pay | Admitting: Dietician

## 2020-12-05 ENCOUNTER — Other Ambulatory Visit (HOSPITAL_COMMUNITY): Payer: Self-pay

## 2020-12-05 NOTE — Telephone Encounter (Signed)
Patient called to determine the status of the prior authorization for the Dexcom G6 that has been sent to the [employer].  Messaged medical assistant to determine status.  Patient has the Omnipod 5 and asked about a training appointment.  Called patient and stated that she needs to have the Dexcom first and then call to arrange the appointments for the Dexcom and Omnipod.  Oran Rein, RD, LDN, CDCES

## 2020-12-12 ENCOUNTER — Ambulatory Visit: Payer: Medicare Other | Admitting: Cardiology

## 2020-12-14 ENCOUNTER — Ambulatory Visit: Payer: Medicare Other | Admitting: Cardiology

## 2020-12-19 ENCOUNTER — Other Ambulatory Visit: Payer: Self-pay

## 2020-12-19 ENCOUNTER — Ambulatory Visit (INDEPENDENT_AMBULATORY_CARE_PROVIDER_SITE_OTHER): Payer: Medicare Other | Admitting: Endocrinology

## 2020-12-19 ENCOUNTER — Telehealth: Payer: Self-pay | Admitting: Pharmacy Technician

## 2020-12-19 ENCOUNTER — Telehealth: Payer: Self-pay | Admitting: Endocrinology

## 2020-12-19 ENCOUNTER — Other Ambulatory Visit (HOSPITAL_COMMUNITY): Payer: Self-pay

## 2020-12-19 VITALS — BP 110/80 | HR 86 | Ht 64.0 in | Wt 135.6 lb

## 2020-12-19 DIAGNOSIS — I2510 Atherosclerotic heart disease of native coronary artery without angina pectoris: Secondary | ICD-10-CM | POA: Diagnosis not present

## 2020-12-19 DIAGNOSIS — E1040 Type 1 diabetes mellitus with diabetic neuropathy, unspecified: Secondary | ICD-10-CM

## 2020-12-19 LAB — POCT GLYCOSYLATED HEMOGLOBIN (HGB A1C): Hemoglobin A1C: 8.1 % — AB (ref 4.0–5.6)

## 2020-12-19 NOTE — Telephone Encounter (Signed)
Pt says better living now needs PA for dexcom continuous glucose monitor.  Any help is appreciated.  TY

## 2020-12-19 NOTE — Patient Instructions (Addendum)
check your blood sugar 4 times a day.  vary the time of day when you check, between before the 3 meals, and at bedtime.  also check if you have symptoms of your blood sugar being too high or too low.  please keep a record of the readings and bring it to your next appointment here (or you can bring the meter itself).  You can write it on any piece of paper.  please call us sooner if your blood sugar goes below 70, or if most of your readings are over 200.  We are investigating the prior authorization for the dexcom.  Until then, you should limit supper novolog to 6 units.  Please come back for a follow-up appointment in 3 months.

## 2020-12-19 NOTE — Progress Notes (Signed)
Subjective:    Patient ID: Melody Myers, female    DOB: [birth year], 40 y.o.   MRN: [id number]  HPI Pt returns for f/u of diabetes mellitus: DM type: 1 Dx'ed: 2355 Complications: CAD, foot ulcer, NPDR, GP, and PPN Therapy: insulin since dx GDM: never DKA: Last episode was 2016. Severe hypoglycemia: last episode was 2022 Pancreatitis: never Pancreatic imaging: normal on 2021 CT SDOH: none Other: She took pump rx 2013-early 2022 (stopped due to gap in endocrinologist); Pt says she is not at risk for pregnancy.  Interval history: She has mild hypoglycemia at 10PM-1AM, most nights.  She has received pump, but she has not started, and she is waiting for PA for dexcom CGM.  She averages approx 50 units of Novolog total per day.  no cbg record, but states cbg's vary from 39-590.   Past Medical History:  Diagnosis Date   Anxiety    Asthma    Bipolar 1 disorder ([place])    Bipolar depression ([place]) 03/20/2013   Carpal tunnel syndrome 02/04/2013   Formatting of this note might be different from the original. 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 Repaired at [facility] in Sept 2016.  Pt states that all problems are still present.  If followed by [facility] for this. She has f/u appt.  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 10/14/2018   Degenerative disc disease, lumbar 06/05/2015   Formatting of this note might be different from the original. L4-5 > L3-4, mild   Depression    patient reports bipolor   Depression    Diabetic neuropathy ([place])    DKA (diabetic ketoacidoses) 03/19/2013   History of adenomatous polyp of colon 04/17/2015   Formatting of this note might be different from the original. Per path 2016   Hyperlipidemia LDL goal <70 10/04/2018   Hypokalemia  11/15/2014   Ingrown toenail of both feet 04/12/2019   Formatting of this note might be different from the original. great toenails bilateral   Insulin dependent diabetes mellitus with complications    insulin dependant   Jaw pain 02/24/2020   Leukocytosis 03/20/2013   Myocardial infarction ([place]) 10/02/2018   Neuropathy    Non-ST elevation (NSTEMI) myocardial infarction ([place]) 10/02/2018   NSTEMI (non-ST elevated myocardial infarction) ([place]) 10/02/2018   Superficial incisional surgical site infection 11/14/2014   Syncopal episodes    Tobacco abuse 03/20/2013   Urinary retention 11/14/2014    Past Surgical History:  Procedure Laterality Date   CORONARY STENT INTERVENTION N/A 10/02/2018   Procedure: CORONARY STENT INTERVENTION;  Surgeon: Nelva Bush, MD;  Location: [facility];  Service: Cardiovascular;  Laterality: N/A;   LEFT HEART CATH AND CORONARY ANGIOGRAPHY N/A 10/02/2018   Procedure: LEFT HEART CATH AND CORONARY ANGIOGRAPHY;  Surgeon: Nelva Bush, MD;  Location: [facility];  Service: Cardiovascular;  Laterality: N/A;   TUBAL LIGATION     VAGINA RECONSTRUCTION SURGERY  10/2014   & ana, reconstruction    Social History   Socioeconomic History   Marital status: Widowed    Spouse name: Not on file   Number of children: 2   Years of education: Not on file   Highest education level: Not on file  Occupational History   Occupation: Disabled  Tobacco Use  Smoking status: Every Day    Packs/day: 0.25    Types: Cigarettes   Smokeless tobacco: Never  Vaping Use   Vaping Use: Never used  Substance and Sexual Activity   Alcohol use: Not Currently    Comment: occasional   Drug use: No   Sexual activity: Yes    Comment: tubal ligation  Other Topics Concern   Not on file  Social History Narrative   Not on file   Social Determinants of Health   Financial Resource Strain: Not on file  Food Insecurity: Not on file  Transportation Needs: Not on file  Physical  Activity: Not on file  Stress: Not on file  Social Connections: Not on file  Intimate Partner Violence: Not on file    Current Outpatient Medications on File Prior to Visit  Medication Sig Dispense Refill   acetaminophen (TYLENOL) 325 MG tablet Take 650 mg by mouth every 6 (six) hours as needed for moderate pain or headache.      albuterol (PROVENTIL) (2.5 MG/3ML) 0.083% nebulizer solution Take 2.5 mg by nebulization as needed for wheezing or shortness of breath.     aspirin EC 81 MG EC tablet Take 1 tablet (81 mg total) by mouth daily.     atorvastatin (LIPITOR) 80 MG tablet Take 1 tablet (80 mg total) by mouth daily at 6 PM. 90 tablet 3   BREO ELLIPTA 100-25 MCG/INH AEPB Inhale 1 puff into the lungs daily. 28 each 11   clopidogrel (PLAVIX) 75 MG tablet TAKE 1 TABLET BY MOUTH EVERY DAY (Patient taking differently: Take 75 mg by mouth daily.) 90 tablet 2   Continuous Blood Gluc Sensor (DEXCOM G6 SENSOR) MISC 1 Device by Does not apply route See admin instructions. Change every 10 days 9 each 3   doxycycline (VIBRA-TABS) 100 MG tablet Take 100 mg by mouth 2 (two) times daily.     fluticasone (FLONASE) 50 MCG/ACT nasal spray Place 2 sprays into both nostrils daily. 2 sprays each nostril at night 16 g 6   Fluticasone-Salmeterol (ADVAIR) 250-50 MCG/DOSE AEPB Inhale 1 puff into the lungs 2 (two) times daily.     Glucagon, rDNA, (GLUCAGON EMERGENCY) 1 MG KIT Inject 1 mg as directed as needed. for low blood sugar episodes 1 kit 11   insulin aspart (NOVOLOG) 100 UNIT/ML injection Inject 10-15 Units into the skin 4 (four) times daily. Sliding scale     Insulin Disposable Pump (OMNIPOD 5 G6 POD, GEN 5,) MISC 1 Device by Does not apply route every 3 (three) days. 30 each 3   Insulin Glargine (BASAGLAR KWIKPEN) 100 UNIT/ML Inject 30 Units into the skin daily. 9 mL 11   isosorbide mononitrate (IMDUR) 30 MG 24 hr tablet Take 30 mg by mouth daily.     loperamide (IMODIUM) 2 MG capsule Take 2 mg by mouth as  needed for diarrhea or loose stools.     metoprolol tartrate (LOPRESSOR) 25 MG tablet Take 1 tablet (25 mg total) by mouth 2 (two) times daily. 180 tablet 3   montelukast (SINGULAIR) 10 MG tablet Take 10 mg by mouth at bedtime.     Multiple Vitamin (MULTIVITAMIN WITH MINERALS) TABS Take 1 tablet by mouth daily. Unknown strength     nitroGLYCERIN (NITROSTAT) 0.4 MG SL tablet PLACE 1 TABLET UNDER THE TONGUE EVERY 5 MINUTES X 3 DOSES AS NEEDED FOR CHEST PAIN. MAX3TAB/15MIN (Patient taking differently: Place 0.4 mg under the tongue every 5 (five) minutes as needed for chest pain. PLACE 1  TABLET UNDER THE TONGUE EVERY 5 MINUTES X 3 DOSES AS NEEDED FOR CHEST PAIN. MAX3TAB/15MIN) 25 tablet 3   triamcinolone cream (KENALOG) 0.1 % Apply 1 application topically 2 (two) times daily. 30 g 0   VENTOLIN HFA 108 (90 Base) MCG/ACT inhaler Inhale 2 puffs into the lungs every 4 (four) hours as needed for wheezing or shortness of breath.     No current facility-administered medications on file prior to visit.    Allergies  Allergen Reactions   Amoxicillin Itching   Penicillins Hives, Itching and Swelling    Did it involve swelling of the face/tongue/throat, SOB, or low BP? No Did it involve sudden or severe rash/hives, skin peeling, or any reaction on the inside of your mouth or nose? No Did you need to seek medical attention at a hospital or doctor's office? No When did it last happen?      Childhood If all above answers are "NO", may proceed with cephalosporin use.   Prednisone Itching and Swelling    Family History  Problem Relation Age of Onset   Heart attack Father 46   Diabetes Sister    Heart attack Brother 29   Heart disease Brother    Diabetes Maternal Grandmother    COPD Paternal Grandmother     BP 110/80 (BP Location: Right Arm, Patient Position: Sitting, Cuff Size: Normal)   Pulse 86   Ht '5\' 4"'  (1.626 m)   Wt 135 lb 9.6 oz (61.5 kg)   SpO2 96%   BMI 23.28 kg/m   Review of  Systems     Objective:   Physical Exam    A1c=8.1%.    Assessment & Plan:  Type 1 DM: uncontrolled.  Hypoglycemia, due to insulin.   Patient Instructions  check your blood sugar 4 times a day.  vary the time of day when you check, between before the 3 meals, and at bedtime.  also check if you have symptoms of your blood sugar being too high or too low.  please keep a record of the readings and bring it to your next appointment here (or you can bring the meter itself).  You can write it on any piece of paper.  please call us sooner if your blood sugar goes below 70, or if most of your readings are over 200.  We are investigating the prior authorization for the dexcom.  Until then, you should limit supper novolog to 6 units.  Please come back for a follow-up appointment in 3 months.

## 2020-12-19 NOTE — Telephone Encounter (Signed)
ERROR

## 2020-12-21 ENCOUNTER — Telehealth: Payer: Self-pay | Admitting: Dietician

## 2020-12-21 NOTE — Telephone Encounter (Signed)
Patient returned call. Read Adrienne's note regarding Dexcom and need have [facility]. Patient stated that [facility] needs clinical notes.   Called [facility] and they confirmed that they need clinical notes that state diagnosis, insulin regime and blood glucose testing. I faxed Dr. George Hugh 12/19/2020 notes to [facility].  [facility] contact info: [phone] Weston Brass is assigned employee extension [phone] Fax [phone]  Patient to call for training or further concerns.  Oran Rein, RD, LDN, CDCES

## 2020-12-24 ENCOUNTER — Encounter: Payer: Self-pay | Admitting: Family Medicine

## 2020-12-25 NOTE — Telephone Encounter (Signed)
Pump start scheduled for Wednesday.  To stop long acting insulin Tuesday night.

## 2020-12-27 ENCOUNTER — Encounter: Payer: Medicare Other | Admitting: Nutrition

## 2020-12-28 ENCOUNTER — Telehealth: Payer: Self-pay | Admitting: Dietician

## 2020-12-28 ENCOUNTER — Other Ambulatory Visit: Payer: Self-pay

## 2020-12-28 ENCOUNTER — Telehealth: Payer: Self-pay | Admitting: Endocrinology

## 2020-12-28 ENCOUNTER — Other Ambulatory Visit: Payer: Self-pay | Admitting: Endocrinology

## 2020-12-28 MED ORDER — INSULIN ASPART 100 UNIT/ML IJ SOLN: 6.0000 [IU] | Freq: Three times a day (TID) | INTRAMUSCULAR | 99 refills | Status: DC

## 2020-12-28 NOTE — Telephone Encounter (Signed)
MEDICATION: Novolog Vial  PHARMACY:  [facility] in [place]   HAS THE PATIENT CONTACTED THEIR PHARMACY?   Yes, they did not have RX  IS THIS A 90 DAY SUPPLY :  yes  IS PATIENT OUT OF MEDICATION: yes   IF NOT; HOW MUCH IS LEFT: none  LAST APPOINTMENT DATE: @10 /25/2022  NEXT APPOINTMENT DATE:@1 /06/2021  DO WE HAVE YOUR PERMISSION TO LEAVE A DETAILED MESSAGE?: yes  OTHER COMMENTS:  Patient advised that her last visit she was told the Novolog would be sent to the pharmacy for her but that [facility] does not have it to fill.   **Let patient know to contact pharmacy at the end of the day to make sure medication is ready. **  ** Please notify patient to allow 48-72 hours to process**  **Encourage patient to contact the pharmacy for refills or they can request refills through [facility]**

## 2020-12-28 NOTE — Telephone Encounter (Signed)
Returned patient call re:  insulin pump training. Patient cancelled her last pump training appointment as she purchased a new phone but this was not compatible with the Dexcom app. She states that she will purchase a new phone. New pump training appointment made with Bonita Quin.  She also states that she is almost out of insulin and called our office. I sent message to MA.  Oran Rein, RD, LDN, CDCES

## 2021-01-04 ENCOUNTER — Encounter: Payer: Self-pay | Admitting: Cardiology

## 2021-01-04 ENCOUNTER — Other Ambulatory Visit: Payer: Self-pay

## 2021-01-04 ENCOUNTER — Ambulatory Visit (INDEPENDENT_AMBULATORY_CARE_PROVIDER_SITE_OTHER): Payer: Medicare Other | Admitting: Cardiology

## 2021-01-04 VITALS — BP 92/60 | HR 81 | Ht 64.0 in | Wt 136.0 lb

## 2021-01-04 DIAGNOSIS — G8929 Other chronic pain: Secondary | ICD-10-CM

## 2021-01-04 DIAGNOSIS — E1040 Type 1 diabetes mellitus with diabetic neuropathy, unspecified: Secondary | ICD-10-CM | POA: Diagnosis not present

## 2021-01-04 DIAGNOSIS — I2510 Atherosclerotic heart disease of native coronary artery without angina pectoris: Secondary | ICD-10-CM | POA: Diagnosis not present

## 2021-01-04 DIAGNOSIS — E785 Hyperlipidemia, unspecified: Secondary | ICD-10-CM | POA: Diagnosis not present

## 2021-01-04 DIAGNOSIS — R079 Chest pain, unspecified: Secondary | ICD-10-CM

## 2021-01-04 LAB — BASIC METABOLIC PANEL
BUN/Creatinine Ratio: 11 (ref 9–23)
BUN: 5 mg/dL — ABNORMAL LOW (ref 6–20)
CO2: 14 mmol/L — ABNORMAL LOW (ref 20–29)
Calcium: 9.7 mg/dL (ref 8.7–10.2)
Chloride: 99 mmol/L (ref 96–106)
Creatinine, Ser: 0.44 mg/dL — ABNORMAL LOW (ref 0.57–1.00)
Glucose: 76 mg/dL (ref 70–99)
Potassium: 4.3 mmol/L (ref 3.5–5.2)
Sodium: 136 mmol/L (ref 134–144)
eGFR: 127 mL/min/{1.73_m2} (ref >59)

## 2021-01-04 LAB — TROPONIN T: Troponin T (Highly Sensitive): <6 ng/L (ref 0–14)

## 2021-01-04 NOTE — Patient Instructions (Signed)
Medication Instructions:  Your physician recommends that you continue on your current medications as directed. Please refer to the Current Medication list given to you today.  *If you need a refill on your cardiac medications before your next appointment, please call your pharmacy*   Lab Work: Your physician recommends that you return for lab work today: bmp, troponin  If you have labs (blood work) drawn today and your tests are completely normal, you will receive your results only by: MyChart Message (if you have MyChart) OR A paper copy in the mail If you have any lab test that is abnormal or we need to change your treatment, we will call you to review the results.   Testing/Procedures: Non-Cardiac CT scanning, (CAT scanning), is a noninvasive, special x-ray that produces cross-sectional images of the body using x-rays and a computer. CT scans help physicians diagnose and treat medical conditions. For some CT exams, a contrast material is used to enhance visibility in the area of the body being studied. CT scans provide greater clarity and reveal more details than regular x-ray exams.   [facility] at [facility] [address], [address] [place], [place] [address] Phone: [phone]    Please arrive 15 minutes prior to your appointment time for registration and insurance purposes.  The test will take approximately 3 to 4 hours to complete; you may bring reading material.  If someone comes with you to your appointment, they will need to remain in the main lobby due to limited space in the testing area. **If you are pregnant or breastfeeding, please notify the nuclear lab prior to your appointment**  How to prepare for your Myocardial Perfusion Test: Do not eat or drink 3 hours prior to your test, except you may have water. Do not consume products containing caffeine (regular or decaffeinated) 12 hours prior to your test. (ex: coffee, chocolate, sodas,  tea). Do bring a list of your current medications with you.  If not listed below, you may take your medications as normal.  Do wear comfortable clothes (no dresses or overalls) and walking shoes, tennis shoes preferred (No heels or open toe shoes are allowed). Do NOT wear cologne, perfume, aftershave, or lotions (deodorant is allowed). If these instructions are not followed, your test will have to be rescheduled.  Please report to [address], [address] for your test.  If you have questions or concerns about your appointment, you can call the Nuclear Lab at [phone].  If you cannot keep your appointment, please provide 24 hours notification to the Nuclear Lab, to avoid a possible $50 charge to your account.    Follow-Up: At [facility], you and your health needs are our priority.  As part of our continuing mission to provide you with exceptional heart care, we have created designated Provider Care Teams.  These Care Teams include your primary Cardiologist (physician) and Advanced Practice Providers (APPs -  Physician Assistants and Nurse Practitioners) who all work together to provide you with the care you need, when you need it.  We recommend signing up for the patient portal called "MyChart".  Sign up information is provided on this After Visit Summary.  MyChart is used to connect with patients for Virtual Visits (Telemedicine).  Patients are able to view lab/test results, encounter notes, upcoming appointments, etc.  Non-urgent messages can be sent to your provider as well.   To learn more about what you can do with MyChart, go to [email or web address].  Your next appointment:   2 month(s)  The format for your next appointment:   In Person  Provider:   Gypsy Balsam, MD    Other Instructions  Cardiac Nuclear Scan A cardiac nuclear scan is a test that measures blood flow to the heart when a person is resting and when he or she is exercising. The test looks for  problems such as: Not enough blood reaching a portion of the heart. The heart muscle not working normally. You may need this test if: You have heart disease. You have had abnormal lab results. You have had heart surgery or a balloon procedure to open up blocked arteries (angioplasty). You have chest pain. You have shortness of breath. In this test, a radioactive dye (tracer) is injected into your bloodstream. After the tracer has traveled to your heart, an imaging device is used to measure how much of the tracer is absorbed by or distributed to various areas of your heart. This procedure is usually done at a hospital and takes 2-4 hours. Tell a health care provider about: Any allergies you have. All medicines you are taking, including vitamins, herbs, eye drops, creams, and over-the-counter medicines. Any problems you or family members have had with anesthetic medicines. Any blood disorders you have. Any surgeries you have had. Any medical conditions you have. Whether you are pregnant or may be pregnant. What are the risks? Generally, this is a safe procedure. However, problems may occur, including: Serious chest pain and heart attack. This is only a risk if the stress portion of the test is done. Rapid heartbeat. Sensation of warmth in your chest. This usually passes quickly. Allergic reaction to the tracer. What happens before the procedure? Ask your health care provider about changing or stopping your regular medicines. This is especially important if you are taking diabetes medicines or blood thinners. Follow instructions from your health care provider about eating or drinking restrictions. Remove your jewelry on the day of the procedure. What happens during the procedure? An IV will be inserted into one of your veins. Your health care provider will inject a small amount of radioactive tracer through the IV. You will wait for 20-40 minutes while the tracer travels through your  bloodstream. Your heart activity will be monitored with an electrocardiogram (ECG). You will lie down on an exam table. Images of your heart will be taken for about 15-20 minutes. You may also have a stress test. For this test, one of the following may be done: You will exercise on a treadmill or stationary bike. While you exercise, your heart's activity will be monitored with an ECG, and your blood pressure will be checked. You will be given medicines that will increase blood flow to parts of your heart. This is done if you are unable to exercise. When blood flow to your heart has peaked, a tracer will again be injected through the IV. After 20-40 minutes, you will get back on the exam table and have more images taken of your heart. Depending on the type of tracer used, scans may need to be repeated 3-4 hours later. Your IV line will be removed when the procedure is over. The procedure may vary among health care providers and hospitals. What happens after the procedure? Unless your health care provider tells you otherwise, you may return to your normal schedule, including diet, activities, and medicines. Unless your health care provider tells you otherwise, you may increase your fluid intake. This will help to flush the contrast  dye from your body. Drink enough fluid to keep your urine pale yellow. Ask your health care provider, or the department that is doing the test: When will my results be ready? How will I get my results? Summary A cardiac nuclear scan measures the blood flow to the heart when a person is resting and when he or she is exercising. Tell your health care provider if you are pregnant. Before the procedure, ask your health care provider about changing or stopping your regular medicines. This is especially important if you are taking diabetes medicines or blood thinners. After the procedure, unless your health care provider tells you otherwise, increase your fluid intake. This  will help flush the contrast dye from your body. After the procedure, unless your health care provider tells you otherwise, you may return to your normal schedule, including diet, activities, and medicines. This information is not intended to replace advice given to you by your health care provider. Make sure you discuss any questions you have with your health care provider. Document Revised: 10/25/2020 Document Reviewed: 07/26/2020 Elsevier Patient Education  2022 [employer].

## 2021-01-04 NOTE — Progress Notes (Signed)
Cardiology Office Note:    Date:  01/04/2021   ID:  Melody Myers, DOB 03/06/1980, MRN [id number]  PCP:  Haydee Salter, MD  Cardiologist:  Jenne Campus, MD    Referring MD: Haydee Salter, MD   Chief Complaint  Patient presents with   Follow-up  I do not feel well, I have shortness of breath as well as chest pain  History of Present Illness:    Melody Myers is a 40 y.o. female with past medical history significant for coronary artery disease.  In August 2020 she required PTCA and stenting of the obtuse marginal branch.  At the same time she was find to have moderate disease on the artery vessels.  Past medical history is also significant for poorly controlled diabetes, dyslipidemia.  Sadly she continues to smoke.  Last time I seen her she was complaining of having some shortness of breath, echocardiogram being performed which showed preserved left ventricle ejection fraction in spite of that his shortness of breath continued and actually got much worse.  She said for the last 5 days it is really very difficult for her to do much.  She have to sit upright in the bed to help her breathe.  On the physical exam however I do not see any evidence of congestive heart failure.  She went to the restroom while being in our office she came back and she was obviously short of breath.  She also described some pleuritic component of chest pain located in the right side of her chest on the back.  There is no swelling of lower extremities there is no tenderness in her calf.  Past Medical History:  Diagnosis Date   Anxiety    Asthma    Bipolar 1 disorder ([place])    Bipolar depression ([place]) 03/20/2013   Carpal tunnel syndrome 02/04/2013   Formatting of this note might be different from the original. 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 Repaired at [facility] in  Sept 2016.  Pt states that all problems are still present.  If followed by [facility] for this. She has f/u appt.  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 10/14/2018   Degenerative disc disease, lumbar 06/05/2015   Formatting of this note might be different from the original. L4-5 > L3-4, mild   Depression    patient reports bipolor   Depression    Diabetic neuropathy ([place])    DKA (diabetic ketoacidoses) 03/19/2013   History of adenomatous polyp of colon 04/17/2015   Formatting of this note might be different from the original. Per path 2016   Hyperlipidemia LDL goal <70 10/04/2018   Hypokalemia 11/15/2014   Ingrown toenail of both feet 04/12/2019   Formatting of this note might be different from the original. great toenails bilateral   Insulin dependent diabetes mellitus with complications    insulin dependant   Jaw pain 02/24/2020   Leukocytosis 03/20/2013   Myocardial infarction ([place]) 10/02/2018   Neuropathy    Non-ST elevation (NSTEMI) myocardial infarction ([place]) 10/02/2018   NSTEMI (non-ST elevated myocardial infarction) ([place]) 10/02/2018   Superficial incisional surgical site infection 11/14/2014   Syncopal episodes    Tobacco abuse 03/20/2013   Urinary retention 11/14/2014    Past Surgical History:  Procedure Laterality  Date   CORONARY STENT INTERVENTION N/A 10/02/2018   Procedure: CORONARY STENT INTERVENTION;  Surgeon: Nelva Bush, MD;  Location: [facility];  Service: Cardiovascular;  Laterality: N/A;   LEFT HEART CATH AND CORONARY ANGIOGRAPHY N/A 10/02/2018   Procedure: LEFT HEART CATH AND CORONARY ANGIOGRAPHY;  Surgeon: Nelva Bush, MD;  Location: [facility];  Service: Cardiovascular;  Laterality: N/A;   TUBAL LIGATION     VAGINA RECONSTRUCTION SURGERY  10/2014   & ana, reconstruction    Current Medications: Current Meds  Medication Sig   acetaminophen (TYLENOL)  325 MG tablet Take 650 mg by mouth every 6 (six) hours as needed for moderate pain or headache.    albuterol (PROVENTIL) (2.5 MG/3ML) 0.083% nebulizer solution Take 2.5 mg by nebulization as needed for wheezing or shortness of breath.   aspirin EC 81 MG EC tablet Take 1 tablet (81 mg total) by mouth daily.   atorvastatin (LIPITOR) 80 MG tablet Take 1 tablet (80 mg total) by mouth daily at 6 PM.   BREO ELLIPTA 100-25 MCG/INH AEPB Inhale 1 puff into the lungs daily.   clopidogrel (PLAVIX) 75 MG tablet TAKE 1 TABLET BY MOUTH EVERY DAY (Patient taking differently: Take 75 mg by mouth daily.)   Continuous Blood Gluc Sensor (DEXCOM G6 SENSOR) MISC 1 Device by Does not apply route See admin instructions. Change every 10 days   doxycycline (VIBRA-TABS) 100 MG tablet Take 100 mg by mouth 2 (two) times daily.   fluticasone (FLONASE) 50 MCG/ACT nasal spray Place 2 sprays into both nostrils daily. 2 sprays each nostril at night   Fluticasone-Salmeterol (ADVAIR) 250-50 MCG/DOSE AEPB Inhale 1 puff into the lungs 2 (two) times daily.   Glucagon, rDNA, (GLUCAGON EMERGENCY) 1 MG KIT Inject 1 mg as directed as needed. for low blood sugar episodes (Patient taking differently: Inject 1 mg as directed as needed (Low Blood Sugar). for low blood sugar episodes)   insulin aspart (NOVOLOG) 100 UNIT/ML injection Inject 6-15 Units into the skin 3 (three) times daily with meals.   Insulin Disposable Pump (OMNIPOD 5 G6 POD, GEN 5,) MISC 1 Device by Does not apply route every 3 (three) days.   Insulin Glargine (BASAGLAR KWIKPEN) 100 UNIT/ML Inject 30 Units into the skin daily.   isosorbide mononitrate (IMDUR) 30 MG 24 hr tablet Take 30 mg by mouth daily.   loperamide (IMODIUM) 2 MG capsule Take 2 mg by mouth as needed for diarrhea or loose stools.   metoprolol tartrate (LOPRESSOR) 25 MG tablet Take 1 tablet (25 mg total) by mouth 2 (two) times daily.   montelukast (SINGULAIR) 10 MG tablet Take 10 mg by mouth at bedtime.    Multiple Vitamin (MULTIVITAMIN WITH MINERALS) TABS Take 1 tablet by mouth daily. Unknown strength   nitroGLYCERIN (NITROSTAT) 0.4 MG SL tablet PLACE 1 TABLET UNDER THE TONGUE EVERY 5 MINUTES X 3 DOSES AS NEEDED FOR CHEST PAIN. MAX3TAB/15MIN (Patient taking differently: Place 0.4 mg under the tongue every 5 (five) minutes as needed for chest pain. PLACE 1 TABLET UNDER THE TONGUE EVERY 5 MINUTES X 3 DOSES AS NEEDED FOR CHEST PAIN. MAX3TAB/15MIN)   triamcinolone cream (KENALOG) 0.1 % Apply 1 application topically 2 (two) times daily.   VENTOLIN HFA 108 (90 Base) MCG/ACT inhaler Inhale 2 puffs into the lungs every 4 (four) hours as needed for wheezing or shortness of breath.     Allergies:   Amoxicillin, Penicillins, and Prednisone   Social History   Socioeconomic History  Marital status: Widowed    Spouse name: Not on file   Number of children: 2   Years of education: Not on file   Highest education level: Not on file  Occupational History   Occupation: Disabled  Tobacco Use   Smoking status: Every Day    Packs/day: 0.25    Types: Cigarettes   Smokeless tobacco: Never  Vaping Use   Vaping Use: Never used  Substance and Sexual Activity   Alcohol use: Not Currently    Comment: occasional   Drug use: No   Sexual activity: Yes    Comment: tubal ligation  Other Topics Concern   Not on file  Social History Narrative   Not on file   Social Determinants of Health   Financial Resource Strain: Not on file  Food Insecurity: Not on file  Transportation Needs: Not on file  Physical Activity: Not on file  Stress: Not on file  Social Connections: Not on file     Family History: The patient's family history includes COPD in her paternal grandmother; Diabetes in her maternal grandmother and sister; Heart attack (age of onset: 46) in her brother; Heart attack (age of onset: 31) in her father; Heart disease in her brother. ROS:   Please see the history of present illness.    All 14 point  review of systems negative except as described per history of present illness  EKGs/Labs/Other Studies Reviewed:      Recent Labs: 07/12/2020: ALT 15; BUN 10; Creatinine, Ser 0.71; Hemoglobin 15.3; Platelets 248.0; Potassium 3.6; Sodium 139  Recent Lipid Panel    Component Value Date/Time   CHOL 100 06/19/2020 0844   TRIG 66 06/19/2020 0844   HDL 40 06/19/2020 0844   CHOLHDL 2.5 06/19/2020 0844   LDLCALC 46 06/19/2020 0844    Physical Exam:    VS:  BP 92/60 (BP Location: Right Arm, Patient Position: Sitting)   Pulse 81   Ht _0  (1.626 m)   Wt 136 lb (61.7 kg)   SpO2 97%   BMI 23.34 kg/m     Wt Readings from Last 3 Encounters:  01/04/21 136 lb (61.7 kg)  12/19/20 135 lb 9.6 oz (61.5 kg)  10/27/20 136 lb 3.2 oz (61.8 kg)     GEN:  Well nourished, well developed in no acute distress HEENT: Normal NECK: No JVD; No carotid bruits LYMPHATICS: No lymphadenopathy CARDIAC: RRR, no murmurs, no rubs, no gallops RESPIRATORY:  Clear to auscultation without rales, wheezing or rhonchi  ABDOMEN: Soft, non-tender, non-distended MUSCULOSKELETAL:  No edema; No deformity  SKIN: Warm and dry LOWER EXTREMITIES: no swelling NEUROLOGIC:  Alert and oriented x 3 PSYCHIATRIC:  Normal affect   ASSESSMENT:    1. Coronary artery disease involving native coronary artery of native heart without angina pectoris   2. Type 1 diabetes mellitus with diabetic neuropathy, unspecified ([place])   3. Hyperlipidemia LDL goal <70   4. Other chronic pain    PLAN:    In order of problems listed above:  Coronary disease she does have some PTCA and stenting of the obtuse marginal branch 2 years ago on top of that multiple residual noncritical lesions.  She is on dual antiplatelet therapy which I will continue at this for now because of multiple risk factors that are still not modified. Atypical chest pain with some pleuritic component with shortness of breath of course concern is about potentially pulmonary  emboli.  I will schedule her to have CT of her chest  with contrast today to rule it out.  We will continue dual antiplatelets therapy. Type 2 diabetes.  That being followed by internal medicine team.  Last hemoglobin A1c is 8.1 which is from just few weeks ago.  Obviously need to have better control Smoking: Still a problem we will give her prescription for nicotine patch I will also schedule her to have a stress test to see if her symptomatology although atypical is related to her coronary artery disease.  She is also scheduled to see pulmonary doctor which is an excellent idea   Medication Adjustments/Labs and Tests Ordered: Current medicines are reviewed at length with the patient today.  Concerns regarding medicines are outlined above.  No orders of the defined types were placed in this encounter.  Medication changes: No orders of the defined types were placed in this encounter.   Signed, Park Liter, MD, [facility] 01/04/2021 11:55 AM    [place]

## 2021-01-05 ENCOUNTER — Ambulatory Visit (HOSPITAL_BASED_OUTPATIENT_CLINIC_OR_DEPARTMENT_OTHER)
Admission: RE | Admit: 2021-01-05 | Discharge: 2021-01-05 | Disposition: A | Discharge status: Home / Self Care | Payer: Medicare Other | Source: Ambulatory Visit | Attending: Cardiology | Admitting: Cardiology

## 2021-01-05 ENCOUNTER — Emergency Department (HOSPITAL_COMMUNITY)
Admission: EM | Admit: 2021-01-05 | Discharge: 2021-01-05 | Disposition: A | Discharge status: Home / Self Care | Payer: Medicare Other | Attending: Emergency Medicine | Admitting: Emergency Medicine

## 2021-01-05 ENCOUNTER — Ambulatory Visit (INDEPENDENT_AMBULATORY_CARE_PROVIDER_SITE_OTHER): Payer: Medicare Other | Admitting: Family Medicine

## 2021-01-05 ENCOUNTER — Encounter (HOSPITAL_COMMUNITY): Payer: Self-pay | Admitting: Cardiology

## 2021-01-05 ENCOUNTER — Emergency Department (HOSPITAL_COMMUNITY): Payer: Medicare Other

## 2021-01-05 ENCOUNTER — Telehealth (HOSPITAL_COMMUNITY): Payer: Self-pay | Admitting: *Deleted

## 2021-01-05 ENCOUNTER — Other Ambulatory Visit: Payer: Self-pay

## 2021-01-05 VITALS — HR 89 | Temp 97.6°F | Ht 64.0 in | Wt 138.2 lb

## 2021-01-05 DIAGNOSIS — J4540 Moderate persistent asthma, uncomplicated: Secondary | ICD-10-CM | POA: Diagnosis not present

## 2021-01-05 DIAGNOSIS — R0789 Other chest pain: Secondary | ICD-10-CM

## 2021-01-05 DIAGNOSIS — R531 Weakness: Secondary | ICD-10-CM | POA: Insufficient documentation

## 2021-01-05 DIAGNOSIS — R079 Chest pain, unspecified: Secondary | ICD-10-CM | POA: Insufficient documentation

## 2021-01-05 DIAGNOSIS — R0609 Other forms of dyspnea: Secondary | ICD-10-CM

## 2021-01-05 DIAGNOSIS — Z5321 Procedure and treatment not carried out due to patient leaving prior to being seen by health care provider: Secondary | ICD-10-CM | POA: Insufficient documentation

## 2021-01-05 DIAGNOSIS — R0602 Shortness of breath: Secondary | ICD-10-CM | POA: Insufficient documentation

## 2021-01-05 DIAGNOSIS — R42 Dizziness and giddiness: Secondary | ICD-10-CM | POA: Insufficient documentation

## 2021-01-05 DIAGNOSIS — F17200 Nicotine dependence, unspecified, uncomplicated: Secondary | ICD-10-CM

## 2021-01-05 DIAGNOSIS — I2510 Atherosclerotic heart disease of native coronary artery without angina pectoris: Secondary | ICD-10-CM

## 2021-01-05 LAB — I-STAT BETA HCG BLOOD, ED (MC, WL, AP ONLY): I-stat hCG, quantitative: <5 m[IU]/mL (ref <5)

## 2021-01-05 LAB — URINALYSIS, ROUTINE W REFLEX MICROSCOPIC
Bilirubin Urine: NEGATIVE
Glucose, UA: 150 mg/dL — AB
Hgb urine dipstick: NEGATIVE
Ketones, ur: NEGATIVE mg/dL
Leukocytes,Ua: NEGATIVE
Nitrite: NEGATIVE
Protein, ur: NEGATIVE mg/dL
Specific Gravity, Urine: 1.006 (ref 1.005–1.030)
pH: 7 (ref 5.0–8.0)

## 2021-01-05 LAB — CBC WITH DIFFERENTIAL/PLATELET
Abs Immature Granulocytes: 0.03 10*3/uL (ref 0.00–0.07)
Basophils Absolute: 0.1 10*3/uL (ref 0.0–0.1)
Basophils Relative: 1 %
Eosinophils Absolute: 0.1 10*3/uL (ref 0.0–0.5)
Eosinophils Relative: 1 %
HCT: 39.7 % (ref 36.0–46.0)
Hemoglobin: 13.1 g/dL (ref 12.0–15.0)
Immature Granulocytes: 0 %
Lymphocytes Relative: 31 %
Lymphs Abs: 2.4 10*3/uL (ref 0.7–4.0)
MCH: 29.2 pg (ref 26.0–34.0)
MCHC: 33 g/dL (ref 30.0–36.0)
MCV: 88.6 fL (ref 80.0–100.0)
Monocytes Absolute: 0.4 10*3/uL (ref 0.1–1.0)
Monocytes Relative: 5 %
Neutro Abs: 4.7 10*3/uL (ref 1.7–7.7)
Neutrophils Relative %: 62 %
Platelets: 227 10*3/uL (ref 150–400)
RBC: 4.48 MIL/uL (ref 3.87–5.11)
RDW: 12.6 % (ref 11.5–15.5)
WBC: 7.7 10*3/uL (ref 4.0–10.5)
nRBC: 0 % (ref 0.0–0.2)

## 2021-01-05 LAB — BASIC METABOLIC PANEL
Anion gap: 7 (ref 5–15)
BUN: 5 mg/dL — ABNORMAL LOW (ref 6–20)
CO2: 26 mmol/L (ref 22–32)
Calcium: 8.9 mg/dL (ref 8.9–10.3)
Chloride: 104 mmol/L (ref 98–111)
Creatinine, Ser: 0.64 mg/dL (ref 0.44–1.00)
GFR, Estimated: >60 mL/min (ref >60)
Glucose, Bld: 236 mg/dL — ABNORMAL HIGH (ref 70–99)
Potassium: 3.8 mmol/L (ref 3.5–5.1)
Sodium: 137 mmol/L (ref 135–145)

## 2021-01-05 LAB — BRAIN NATRIURETIC PEPTIDE: B Natriuretic Peptide: 447.3 pg/mL — ABNORMAL HIGH (ref 0.0–100.0)

## 2021-01-05 LAB — TROPONIN I (HIGH SENSITIVITY): Troponin I (High Sensitivity): <2 ng/L (ref <18)

## 2021-01-05 NOTE — ED Notes (Signed)
Pt stated she was leaving due to the wait time. This tech removed the IV from her right arm. Moving pt OTF.

## 2021-01-05 NOTE — ED Provider Notes (Signed)
Emergency Medicine Provider Triage Evaluation Note  Melody Myers , a 40 y.o. female  was evaluated in triage.  Pt complains of shortness of breath for the past 3 days, worse with exertion.  Chart review patient went to see her cardiologist Dr. Bing Matter yesterday.  He had plans to do a stress test as well as pulmonary follow-up.  She went to [facility] last night and states that she was admitted for observation however they discharged her this morning.  She states that she was scheduled to have a CTA done today at the cardiologist office for further evaluation with concern for PE and on her way driving there she began feeling more short of breath and had to pull over calling EMS in the process.  She did not get her CT scan done.  She denies any history of DVT or PE.  She denies any exogenous hormone use.  No recent prolonged travel or immobilization.  No hemoptysis.  No active malignancy.  She states she has chest pain at times however none currently.  Review of Systems  Positive: + SOB, CP Negative: - nausea, vomiting, diaphoresis  Physical Exam  BP 119/73 (BP Location: Left Arm)   Pulse 86   Temp 98.1 F (36.7 C) (Oral)   Resp 18   SpO2 100%  Gen:   Awake, no distress   Resp:  Normal effort  MSK:   Moves extremities without difficulty  Other:    Medical Decision Making  Medically screening exam initiated at 4:48 PM.  Appropriate orders placed.  Melody Myers was informed that the remainder of the evaluation will be completed by another provider, this initial triage assessment does not replace that evaluation, and the importance of remaining in the ED until their evaluation is complete.     Tanda Rockers, PA-C 01/05/21 1651    Ernie Avena, MD 01/05/21 [phone]

## 2021-01-05 NOTE — Telephone Encounter (Signed)
MyChart letter sent outlining instructions for upcoming test on 01/10/21 @ 7:15.

## 2021-01-05 NOTE — Progress Notes (Signed)
[facility] [address] [place] [address] Dept: [phone] Dept Fax: [phone]  Office Visit  Subjective:    Patient ID: Melody Myers, female    DOB: Aug 28, 1980, 40 y.o..   MRN: [id number]  Chief Complaint  Patient presents with   Acute Visit    C/o having SOB, fatigue.  Has been in/out of ED for breathing issues. Was told needs to have referral for pulmonology.      History of Present Illness:  Patient is in today for evaluation of dyspnea, chest pain, lightheadedness, and cough. Melody Myers notes she has been having more chest tightness for the last several months. She has a history of asthma and has been using her inhalers. She smokes cigarettes and had been decreasing her smoking as well. She notes in the last 3 days, she has had a significant increase in breathing issues, primarily dyspnea with exertion. She was seen by Dr. Agustin Cree (cardiology) yesterday. He felt that her current symptoms were not related to her CAD or a heart issue. He was worried enough, that he had her scheduled for today for a CT angiogram of the chest to rule-out a pulmonary embolism. Melody Myers notes that at home last evening, she had a near syncopal spell while taking a shower. This scared her enough for her to go tot he [facility] last night. she was kept int he ER overnight while having evaluations. She left there this morning so she could come to see me. She continues to complain of dyspnea.   Past Medical History: Patient Active Problem List   Diagnosis Date Noted   Medicare annual wellness visit, subsequent 40/34/7425   Mild diastolic dysfunction 95/63/8756   Antral ulcer 10/27/2020   Allergic rhinitis 07/12/2020   Gastroesophageal reflux disease without esophagitis 07/12/2020   Jaw pain 02/24/2020   Syncopal episodes    Coronary artery disease- Severe single-vessel CAD with thrombotic occlusion of proximal OM1  10/14/2018   Hyperlipidemia LDL goal <70 10/04/2018   NSTEMI (non-ST elevated myocardial infarction) ([place]) 10/02/2018   History of adenomatous polyp of colon 04/17/2015   Urinary retention 11/14/2014   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   Bipolar depression ([place]) 03/20/2013   Chronic pain 03/20/2013   Tobacco use disorder 03/20/2013   Diabetic neuropathy ([place])    Type 1 diabetes mellitus with diabetic neuropathy, unspecified ([place])    DKA (diabetic ketoacidoses) 03/19/2013   Asthma    Neuropathy    Carpal tunnel syndrome 02/04/2013   Past Surgical History:  Procedure Laterality Date   CORONARY STENT INTERVENTION N/A 10/02/2018   Procedure: CORONARY STENT INTERVENTION;  Surgeon: Nelva Bush, MD;  Location: [facility];  Service: Cardiovascular;  Laterality: N/A;   LEFT HEART CATH AND CORONARY ANGIOGRAPHY N/A 10/02/2018   Procedure: LEFT HEART CATH AND CORONARY ANGIOGRAPHY;  Surgeon: Nelva Bush, MD;  Location: [facility];  Service: Cardiovascular;  Laterality: N/A;   TUBAL LIGATION     VAGINA RECONSTRUCTION SURGERY  10/2014   & ana, reconstruction   Family History  Problem Relation Age of Onset   Heart attack Father 68   Diabetes Sister    Heart attack Brother 29   Heart disease Brother    Diabetes Maternal Grandmother    COPD Paternal Grandmother    Outpatient Medications Prior to Visit  Medication Sig Dispense Refill   acetaminophen (TYLENOL) 325 MG tablet Take 650 mg by mouth every 6 (six) hours as needed for moderate pain  or headache.      albuterol (PROVENTIL) (2.5 MG/3ML) 0.083% nebulizer solution Take 2.5 mg by nebulization as needed for wheezing or shortness of breath.     aspirin EC 81 MG EC tablet Take 1 tablet (81 mg total) by mouth daily.     atorvastatin (LIPITOR) 80 MG tablet Take 1 tablet (80 mg total) by mouth daily at 6 PM. 90 tablet 3   BREO ELLIPTA 100-25 MCG/INH AEPB Inhale 1 puff into the lungs daily. 28 each 11   clopidogrel (PLAVIX) 75 MG  tablet TAKE 1 TABLET BY MOUTH EVERY DAY (Patient taking differently: Take 75 mg by mouth daily.) 90 tablet 2   Continuous Blood Gluc Sensor (DEXCOM G6 SENSOR) MISC 1 Device by Does not apply route See admin instructions. Change every 10 days 9 each 3   doxycycline (VIBRA-TABS) 100 MG tablet Take 100 mg by mouth 2 (two) times daily.     fluticasone (FLONASE) 50 MCG/ACT nasal spray Place 2 sprays into both nostrils daily. 2 sprays each nostril at night 16 g 6   Fluticasone-Salmeterol (ADVAIR) 250-50 MCG/DOSE AEPB Inhale 1 puff into the lungs 2 (two) times daily.     Glucagon, rDNA, (GLUCAGON EMERGENCY) 1 MG KIT Inject 1 mg as directed as needed. for low blood sugar episodes (Patient taking differently: Inject 1 mg as directed as needed (Low Blood Sugar). for low blood sugar episodes) 1 kit 11   insulin aspart (NOVOLOG) 100 UNIT/ML injection Inject 6-15 Units into the skin 3 (three) times daily with meals. 40 mL PRN   Insulin Disposable Pump (OMNIPOD 5 G6 POD, GEN 5,) MISC 1 Device by Does not apply route every 3 (three) days. 30 each 3   Insulin Glargine (BASAGLAR KWIKPEN) 100 UNIT/ML Inject 30 Units into the skin daily. 9 mL 11   isosorbide mononitrate (IMDUR) 30 MG 24 hr tablet Take 30 mg by mouth daily.     loperamide (IMODIUM) 2 MG capsule Take 2 mg by mouth as needed for diarrhea or loose stools.     metoprolol tartrate (LOPRESSOR) 25 MG tablet Take 1 tablet (25 mg total) by mouth 2 (two) times daily. 180 tablet 3   montelukast (SINGULAIR) 10 MG tablet Take 10 mg by mouth at bedtime.     Multiple Vitamin (MULTIVITAMIN WITH MINERALS) TABS Take 1 tablet by mouth daily. Unknown strength     nitroGLYCERIN (NITROSTAT) 0.4 MG SL tablet PLACE 1 TABLET UNDER THE TONGUE EVERY 5 MINUTES X 3 DOSES AS NEEDED FOR CHEST PAIN. MAX3TAB/15MIN (Patient taking differently: Place 0.4 mg under the tongue every 5 (five) minutes as needed for chest pain. PLACE 1 TABLET UNDER THE TONGUE EVERY 5 MINUTES X 3 DOSES AS NEEDED  FOR CHEST PAIN. MAX3TAB/15MIN) 25 tablet 3   triamcinolone cream (KENALOG) 0.1 % Apply 1 application topically 2 (two) times daily. 30 g 0   VENTOLIN HFA 108 (90 Base) MCG/ACT inhaler Inhale 2 puffs into the lungs every 4 (four) hours as needed for wheezing or shortness of breath.     No facility-administered medications prior to visit.    Allergies  Allergen Reactions   Amoxicillin Itching   Penicillins Hives, Itching and Swelling    Did it involve swelling of the face/tongue/throat, SOB, or low BP? No Did it involve sudden or severe rash/hives, skin peeling, or any reaction on the inside of your mouth or nose? No Did you need to seek medical attention at a hospital or doctor's office? No When did it last  happen?      Childhood If all above answers are "NO", may proceed with cephalosporin use.   Prednisone Itching and Swelling   Objective:   Today's Vitals   01/05/21 1306  Pulse: 89  Temp: 97.6 F (36.4 C)  TempSrc: Temporal  SpO2: 97%  Weight: 138 lb 3.2 oz (62.7 kg)  Height: 5' 4" (1.626 m)   Body mass index is 23.72 kg/m.   General: Well developed, well nourished. No acute distress. Lungs: Clear to auscultation bilaterally. No wheezing, rales or rhonchi. Chest: pain over the right lateral chest wall to palpation. CV: RRR without murmurs or rubs. Pulses 2+ bilaterally. Psych: Alert and oriented. Normal mood and affect.  Health Maintenance Due  Topic Date Due   OPHTHALMOLOGY EXAM  Never done   PAP SMEAR-Modifier  Never done   COVID-19 Vaccine (3 - Booster for Moderna series) 03/01/2020     Imaging: Echocardiogram (01/05/2021) IMPRESSION: Left ventricular wall thickness is normal. 2/ Overall left ventricular systolic function is normal with an EF between 60-65%. No regional wall motion abnormalities were noted. There is mild aortic valve sclerosis.  Chest x-ray (01/04/2021) IMPRESSION; No active disease.  Assessment & Plan:   1. Dyspnea on exertion I  reviewed ER records from [facility], including lab studies, imaging, and an echocardiogram. Her WBC was mildly high at 11.0. Her blood sugar was low at 50. Serial troponins were normal. Procalcitonin was normal. D-dimer was normal. UDS was normal. BNP was normal. I recommended Ms. Blondin follow through with her CT scan as ordered. I will refer her to pulmonology to assess her dyspnea further.  - Ambulatory referral to Pulmonology  2. Atypical chest pain This appears to be more of a chest wall pain issue and not cardiac.  3. Moderate persistent asthma without complication Stable. No sign of exacerbation currently.  - Ambulatory referral to Pulmonology  4. Tobacco use disorder I continue to advocate for Ms. Carras to stop her tobacco use.  - Ambulatory referral to Pulmonology  5. Coronary artery disease involving native coronary artery of native heart without angina pectoris Stable. Continue to follow with cardiology.  Haydee Salter, MD

## 2021-01-05 NOTE — ED Triage Notes (Signed)
Arrived via EMS; reported recently discharge from the hospital for the same c/o shortness of breathe x 3 days, chest pain, dizziness and weakness. Stated was told she needs imaging to rule out blood clots.

## 2021-01-08 ENCOUNTER — Ambulatory Visit (HOSPITAL_BASED_OUTPATIENT_CLINIC_OR_DEPARTMENT_OTHER)
Admission: RE | Admit: 2021-01-08 | Discharge: 2021-01-08 | Disposition: A | Discharge status: Home / Self Care | Payer: Medicare Other | Source: Ambulatory Visit | Attending: Cardiology | Admitting: Cardiology

## 2021-01-08 ENCOUNTER — Ambulatory Visit (HOSPITAL_BASED_OUTPATIENT_CLINIC_OR_DEPARTMENT_OTHER): Payer: Medicare Other

## 2021-01-08 ENCOUNTER — Other Ambulatory Visit: Payer: Self-pay

## 2021-01-08 ENCOUNTER — Encounter (HOSPITAL_BASED_OUTPATIENT_CLINIC_OR_DEPARTMENT_OTHER): Payer: Self-pay

## 2021-01-08 DIAGNOSIS — R079 Chest pain, unspecified: Secondary | ICD-10-CM | POA: Diagnosis not present

## 2021-01-08 MED ORDER — IOHEXOL 350 MG/ML SOLN
100.0000 mL | Freq: Once | INTRAVENOUS | Status: AC | PRN
  Administered 2021-01-08: 75 mL via INTRAVENOUS

## 2021-01-09 ENCOUNTER — Telehealth: Payer: Self-pay | Admitting: Cardiology

## 2021-01-09 NOTE — Telephone Encounter (Signed)
Pt is concerned about her being very tired. Pt is sleeping constantly. Pt has an appt to see her PCP 01/10/21 at 10am. She wanted to reach out to Dr. Kirtland Bouchard to see what he suggests Pt says after she wakes up, she's only awake long enough to take her insulin then she sleeps another 3-4 hours

## 2021-01-10 ENCOUNTER — Ambulatory Visit (HOSPITAL_COMMUNITY): Payer: Medicare Other

## 2021-01-10 ENCOUNTER — Telehealth: Payer: Self-pay | Admitting: Nutrition

## 2021-01-10 ENCOUNTER — Other Ambulatory Visit: Payer: Self-pay

## 2021-01-10 ENCOUNTER — Encounter: Payer: Medicare Other | Attending: Endocrinology | Admitting: Nutrition

## 2021-01-10 ENCOUNTER — Ambulatory Visit: Payer: Medicare Other | Admitting: Family Medicine

## 2021-01-10 DIAGNOSIS — E1040 Type 1 diabetes mellitus with diabetic neuropathy, unspecified: Secondary | ICD-10-CM | POA: Insufficient documentation

## 2021-01-10 NOTE — Telephone Encounter (Signed)
Patient is needing vials of Novolog insulin called into the pharmacy.  She is wanting a 2 month supply.  She is using 65u/day  vial insulin pump.  Please call in ASAP.  She has only 1/2 vial remaining.  Thank you

## 2021-01-10 NOTE — Patient Instructions (Signed)
REad pump manual Switch pump to automated mode after temp. Basal rate ends at 7PM tonight.   Call pump help line if questions.

## 2021-01-10 NOTE — Progress Notes (Signed)
Patient was trained on how to use the OmniPod 5 insulin pump.  Her dexcom was linked to her pump, and her pump to our practice.  Settings were put into the pump by the patient:  Basal rate: 0.8u/hr, ISF: 100,  I/C: 20, target:  130 (per patient's request), with a correction over 140.  She was strongly encouraged to reduce this to 120, and then 110 in a few weeks.  She agreed to do this. She forgot and took her [name] last night at 9PM.  The pump was put in manual mode at 100% basal reduction until 7PM tonight.   She filled a pod with Novolog insulin, and applied it to her upper right arm without difficulty. She was shown how to switch to automated mode when temp basal rate ends at 7PM.  She reported good understanding of this.  We reviewed all topics on the checklist and she signed the checklist as understanding all topics with no final questions.

## 2021-01-11 ENCOUNTER — Telehealth: Payer: Self-pay

## 2021-01-11 ENCOUNTER — Other Ambulatory Visit: Payer: Self-pay | Admitting: Endocrinology

## 2021-01-11 ENCOUNTER — Ambulatory Visit: Payer: Medicare Other | Admitting: Family Medicine

## 2021-01-11 MED ORDER — INSULIN ASPART 100 UNIT/ML IJ SOLN: INTRAMUSCULAR | 3 refills | Status: DC

## 2021-01-11 NOTE — Telephone Encounter (Signed)
Mychart message sent informing the patient

## 2021-01-11 NOTE — Telephone Encounter (Signed)
Spoke with the patient, detailed instructions given. She stated that she would be there for her test. Asked to call back with any questions. S.Yumiko Alkins EMTP 

## 2021-01-15 NOTE — Telephone Encounter (Signed)
LM to return my call. 

## 2021-01-16 ENCOUNTER — Ambulatory Visit (HOSPITAL_COMMUNITY): Payer: Medicare Other

## 2021-01-16 NOTE — Telephone Encounter (Signed)
Left message for patient to return call.

## 2021-01-17 NOTE — Telephone Encounter (Signed)
Left message for patient to return call.

## 2021-01-22 ENCOUNTER — Ambulatory Visit: Payer: Medicare Other | Admitting: Family Medicine

## 2021-01-22 ENCOUNTER — Telehealth (HOSPITAL_COMMUNITY): Payer: Self-pay | Admitting: Cardiology

## 2021-01-22 NOTE — Telephone Encounter (Signed)
Patient has called and cancelled Myoview for reason below:   12/2220 [facility] to reschedule @ 10am/LBW  Patient has cancelled 01/10/21 and 01/15/21. LBW  Order will be removed from [facility] and when patient calls back to reschedule we willa reinstate the order.

## 2021-01-24 NOTE — Telephone Encounter (Signed)
Patient informed to see pcp regarding concerns per Dr. Bing Matter.

## 2021-01-25 ENCOUNTER — Other Ambulatory Visit: Payer: Self-pay | Admitting: Family Medicine

## 2021-01-29 ENCOUNTER — Ambulatory Visit (INDEPENDENT_AMBULATORY_CARE_PROVIDER_SITE_OTHER): Payer: Medicare Other | Admitting: Family Medicine

## 2021-01-29 ENCOUNTER — Institutional Professional Consult (permissible substitution): Payer: Medicare Other | Admitting: Pulmonary Disease

## 2021-01-29 ENCOUNTER — Encounter: Payer: Self-pay | Admitting: Family Medicine

## 2021-01-29 ENCOUNTER — Other Ambulatory Visit: Payer: Self-pay

## 2021-01-29 VITALS — BP 116/68 | HR 80 | Temp 97.4°F | Ht 64.0 in | Wt 135.2 lb

## 2021-01-29 DIAGNOSIS — I2510 Atherosclerotic heart disease of native coronary artery without angina pectoris: Secondary | ICD-10-CM

## 2021-01-29 DIAGNOSIS — R0609 Other forms of dyspnea: Secondary | ICD-10-CM | POA: Diagnosis not present

## 2021-01-29 DIAGNOSIS — J4540 Moderate persistent asthma, uncomplicated: Secondary | ICD-10-CM

## 2021-01-29 DIAGNOSIS — F17200 Nicotine dependence, unspecified, uncomplicated: Secondary | ICD-10-CM

## 2021-01-29 DIAGNOSIS — K219 Gastro-esophageal reflux disease without esophagitis: Secondary | ICD-10-CM

## 2021-01-29 DIAGNOSIS — E1040 Type 1 diabetes mellitus with diabetic neuropathy, unspecified: Secondary | ICD-10-CM

## 2021-01-29 MED ORDER — PANTOPRAZOLE SODIUM 40 MG PO TBEC: 40.0000 mg | DELAYED_RELEASE_TABLET | Freq: Every day | ORAL | 3 refills | Status: DC

## 2021-01-29 NOTE — Progress Notes (Signed)
[facility] [address] [place] [address] Dept: [phone] Dept Fax: [phone]  Office Visit  Subjective:    Patient ID: Melody Myers, female    DOB: 09/20/1980, 40 y.o..   MRN: [id number]  Chief Complaint  Patient presents with   Follow-up    F/u fatigue.  C/o having a cough, ST, mucus drainage, loss of appetite. Covid test last week Negative.   Has appointment with pulmonology today.     History of Present Illness:  Patient is in today for reassessment of her respiratory issues. I had last seen Melody Myers on 11/11 with dyspnea, chest pain, lightheadedness, and cough. The chest tightness had been going on for the last several months. She has a history of asthma and had been using her inhalers. She smokes cigarettes and had been decreasing her smoking as well. Just prior tot he visit, she had a significant increase in breathing issues, primarily dyspnea with exertion. She was seen by Dr. Agustin Cree (cardiology) on 11/10. He felt that her current symptoms were not related to her CAD or a heart issue. He was worried enough, that he had her scheduled for today for a CT angiogram of the chest to rule-out a pulmonary embolism. Melody Myers that evening, had a near syncopal spell while taking a shower, so went tot he ED at [facility].  She notes over the past month, she has been able to finally get back on her insulin pump. She had noted her fasting blood sugars are much improved, now around 90. Her daytime sugars are 200-250, which is an improvement over her previous. Melody Myers continues to note breathing issues/dyspnea. She has cut her cigarette smoking back further (down to 2 cigarettes a day). She continues to complain of fatigue and difficulty sleeping, including loud snoring.  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   Bipolar depression ([place]) 03/20/2013   Chronic pain 03/20/2013   Tobacco use disorder 03/20/2013   Diabetic neuropathy ([place])    Type 1 diabetes mellitus with diabetic neuropathy, unspecified ([place])    DKA (diabetic ketoacidoses) 03/19/2013   Asthma    Neuropathy    Carpal tunnel syndrome 02/04/2013   Past Surgical History:  Procedure Laterality Date   CORONARY STENT INTERVENTION N/A 10/02/2018   Procedure: CORONARY STENT INTERVENTION;  Surgeon: Nelva Bush, MD;  Location: [facility];  Service: Cardiovascular;  Laterality: N/A;   LEFT HEART CATH AND CORONARY ANGIOGRAPHY N/A 10/02/2018   Procedure: LEFT HEART CATH AND CORONARY ANGIOGRAPHY;  Surgeon: Nelva Bush, MD;  Location: [facility];  Service: Cardiovascular;  Laterality: N/A;   TUBAL LIGATION     VAGINA RECONSTRUCTION SURGERY  10/2014   & ana, reconstruction   Family History  Problem Relation Age of Onset   Heart attack Father 24   Diabetes Sister    Heart attack Brother 23   Heart disease Brother    Diabetes Maternal Grandmother  COPD Paternal Grandmother    Outpatient Medications Prior to Visit  Medication Sig Dispense Refill   acetaminophen (TYLENOL) 325 MG tablet Take 650 mg by mouth every 6 (six) hours as needed for moderate pain or headache.      albuterol (PROVENTIL) (2.5 MG/3ML) 0.083% nebulizer solution Take 2.5 mg by nebulization as needed for wheezing or shortness of breath.     aspirin EC 81 MG EC tablet Take 1 tablet (81 mg total) by  mouth daily.     atorvastatin (LIPITOR) 80 MG tablet Take 1 tablet (80 mg total) by mouth daily at 6 PM. 90 tablet 3   BREO ELLIPTA 100-25 MCG/INH AEPB Inhale 1 puff into the lungs daily. 28 each 11   clopidogrel (PLAVIX) 75 MG tablet TAKE 1 TABLET BY MOUTH EVERY DAY (Patient taking differently: Take 75 mg by mouth daily.) 90 tablet 2   Continuous Blood Gluc Sensor (DEXCOM G6 SENSOR) MISC 1 Device by Does not apply route See admin instructions. Change every 10 days 9 each 3   fluticasone (FLONASE) 50 MCG/ACT nasal spray Place 2 sprays into both nostrils daily. 2 sprays each nostril at night 16 g 6   Fluticasone-Salmeterol (ADVAIR) 250-50 MCG/DOSE AEPB Inhale 1 puff into the lungs 2 (two) times daily.     Glucagon, rDNA, (GLUCAGON EMERGENCY) 1 MG KIT Inject 1 mg as directed as needed. for low blood sugar episodes (Patient taking differently: Inject 1 mg as directed as needed (Low Blood Sugar). for low blood sugar episodes) 1 kit 11   insulin aspart (NOVOLOG) 100 UNIT/ML injection For use in pump, total of 70 units per day 70 mL 3   Insulin Disposable Pump (OMNIPOD 5 G6 POD, GEN 5,) MISC 1 Device by Does not apply route every 3 (three) days. 30 each 3   Insulin Glargine (BASAGLAR KWIKPEN) 100 UNIT/ML Inject 30 Units into the skin daily. 9 mL 11   isosorbide mononitrate (IMDUR) 30 MG 24 hr tablet Take 30 mg by mouth daily.     loperamide (IMODIUM) 2 MG capsule Take 2 mg by mouth as needed for diarrhea or loose stools.     metoprolol tartrate (LOPRESSOR) 25 MG tablet Take 1 tablet (25 mg total) by mouth 2 (two) times daily. 180 tablet 3   montelukast (SINGULAIR) 10 MG tablet Take 10 mg by mouth at bedtime.     Multiple Vitamin (MULTIVITAMIN WITH MINERALS) TABS Take 1 tablet by mouth daily. Unknown strength     nitroGLYCERIN (NITROSTAT) 0.4 MG SL tablet PLACE 1 TABLET UNDER THE TONGUE EVERY 5 MINUTES X 3 DOSES AS NEEDED FOR CHEST PAIN. MAX3TAB/15MIN (Patient taking differently: Place 0.4 mg under the  tongue every 5 (five) minutes as needed for chest pain. PLACE 1 TABLET UNDER THE TONGUE EVERY 5 MINUTES X 3 DOSES AS NEEDED FOR CHEST PAIN. MAX3TAB/15MIN) 25 tablet 3   pantoprazole (PROTONIX) 40 MG tablet Take 40 mg by mouth daily.     triamcinolone cream (KENALOG) 0.1 % Apply 1 application topically 2 (two) times daily. 30 g 0   VENTOLIN HFA 108 (90 Base) MCG/ACT inhaler Inhale 2 puffs into the lungs every 4 (four) hours as needed for wheezing or shortness of breath.     doxycycline (VIBRA-TABS) 100 MG tablet Take 100 mg by mouth 2 (two) times daily.     No facility-administered medications prior to visit.   Allergies  Allergen Reactions   Amoxicillin Itching   Penicillins Hives, Itching and Swelling    Did it  involve swelling of the face/tongue/throat, SOB, or low BP? No Did it involve sudden or severe rash/hives, skin peeling, or any reaction on the inside of your mouth or nose? No Did you need to seek medical attention at a hospital or doctor's office? No When did it last happen?      Childhood If all above answers are "NO", may proceed with cephalosporin use.   Prednisone Itching and Swelling    Objective:   Today's Vitals   01/29/21 1028  BP: 116/68  Pulse: 80  Temp: (!) 97.4 F (36.3 C)  TempSrc: Temporal  SpO2: 96%  Weight: 135 lb 3.2 oz (61.3 kg)  Height: _0  (1.626 m)   Body mass index is 23.21 kg/m.   General: Well developed, well nourished. No acute distress. HEENT: Normocephalic, non-traumatic. Conjunctiva clear. External ears normal. EAC   and TMs normal bilaterally. Nose clear without congestion or rhinorrhea. Mucous   membranes moist. Oropharynx clear. Good dentition. Neck: Supple. No lymphadenopathy. No thyromegaly. Lungs: Clear to auscultation bilaterally. No wheezing, rales or rhonchi. CV: RRR without murmurs or rubs. Pulses 2+ bilaterally. Psych: Alert and oriented. Normal mood and affect.  Health Maintenance Due  Topic Date Due   OPHTHALMOLOGY  EXAM  Never done   PAP SMEAR-Modifier  Never done   COVID-19 Vaccine (3 - Booster for Moderna series) 03/01/2020   Imaging:  CT Angio of Chest (01/08/2021) IMPRESSION: Negative for pulmonary embolus or other acute intrathoracic process.    Echocardiogram (01/05/2021) CONCLUSIONS: 1. Left ventricular wall thickness is normal. 2. Overall left ventricular systolic function is normal with an EF between 60-65%. No regional wall motion abnormalities were noted. 3. There is mild aortic valve sclerosis.  Assessment & Plan:   1. Dyspnea on exertion Melody Myers's dyspnea and chest pain appear to be non-cardiac. She will be seeing the pulmonologist this afternoon for evaluation.   2. Moderate persistent asthma without complication Appears controlled with Breo Ellipta and albuterol.  3. Coronary artery disease involving native coronary artery of native heart without angina pectoris Stable. Currently managed on aspirin, clopidogrel, isosorbide, and metoprolol. Recent echocardiogram essentially normal.  4. Type 1 diabetes mellitus with diabetic neuropathy, unspecified ([place]) Melody Myers is noting improvements since being back on insulin pump. She will continue to follow with Dr. Loanne Drilling.  5. Tobacco use disorder Continues to taper down on how many cigarettes she is smoking. I encouraged her towards her goal to eventually quit.  Haydee Salter, MD

## 2021-01-31 ENCOUNTER — Ambulatory Visit: Payer: Medicare Other | Admitting: Family Medicine

## 2021-02-01 ENCOUNTER — Telehealth: Payer: Self-pay | Admitting: Dietician

## 2021-02-01 NOTE — Telephone Encounter (Signed)
Returned patient call. She stated that she cannot get her Dexcom to work and this has pushed her Omnipod 5 into manual mode.  She has changed the sensor and transmitter.  I reached out to [facility] clinical support as she is also well educated with the Omnipod5 and left a message.  Returned patient call.  She was not available.  Left a message to let her know of some problems that can occur with restarting the Dexcom.  Advised her to call Dexcom tech support or Omnipod tech support.  Patient to call for further questions.  Oran Rein, RD, LDN, CDCES

## 2021-02-12 ENCOUNTER — Institutional Professional Consult (permissible substitution): Payer: Medicare Other | Admitting: Pulmonary Disease

## 2021-02-20 ENCOUNTER — Encounter: Payer: Self-pay | Admitting: Family Medicine

## 2021-02-27 ENCOUNTER — Ambulatory Visit: Payer: Medicare Other | Admitting: Family Medicine

## 2021-03-01 ENCOUNTER — Ambulatory Visit: Payer: Medicare Other | Admitting: Endocrinology

## 2021-03-02 ENCOUNTER — Encounter: Payer: Self-pay | Admitting: Pulmonary Disease

## 2021-03-02 ENCOUNTER — Ambulatory Visit (INDEPENDENT_AMBULATORY_CARE_PROVIDER_SITE_OTHER): Payer: Medicare Other | Admitting: Pulmonary Disease

## 2021-03-02 VITALS — BP 112/64 | HR 88 | Temp 97.8°F | Ht 64.0 in | Wt 134.0 lb

## 2021-03-02 DIAGNOSIS — F17210 Nicotine dependence, cigarettes, uncomplicated: Secondary | ICD-10-CM

## 2021-03-02 DIAGNOSIS — U071 COVID-19: Secondary | ICD-10-CM

## 2021-03-02 DIAGNOSIS — J4540 Moderate persistent asthma, uncomplicated: Secondary | ICD-10-CM | POA: Diagnosis not present

## 2021-03-02 MED ORDER — TRELEGY ELLIPTA 100-62.5-25 MCG/ACT IN AEPB: 1.0000 | INHALATION_SPRAY | Freq: Every day | RESPIRATORY_TRACT | 6 refills | Status: DC

## 2021-03-02 MED ORDER — VARENICLINE TARTRATE 0.5 MG X 11 & 1 MG X 42 PO TBPK: ORAL_TABLET | ORAL | 0 refills | Status: DC

## 2021-03-02 MED ORDER — ALBUTEROL SULFATE (2.5 MG/3ML) 0.083% IN NEBU: 2.5000 mg | INHALATION_SOLUTION | Freq: Four times a day (QID) | RESPIRATORY_TRACT | 3 refills | Status: DC | PRN

## 2021-03-02 NOTE — Progress Notes (Signed)
Synopsis: Referred in January 2023 for dyspnea by Arlester Marker, MD  Subjective:   PATIENT ID: Melody Myers GENDER: female DOB: June 03, 1980, MRN: [id number]   HPI  Chief Complaint  Patient presents with   Consult    Referred by PCP for post covid cough. Had covid for the 2nd time about 2-3 weeks ago. Cough is non-productive. Increased wheezing.    Melody Myers is a 41 year old woman, daily smoker with history of asthma, MI s/p stent placement in 2020 and covid infections x 2 who is referred to pulmonary clinic with cough and dyspnea.   She reports having cough and dyspnea prior to covid in 2021 and 01/2021, but after the infections her symptoms have worsened.   She is currently using breo ellipta 1 puff daily and advair diskus 250-65mg 1-2 puffs per day as needed. She is also using albuterol inhaler and nebulizer treatments as needed.   She continues to smoke 3-5 cigarettes per day and is using e-cigarette daily. An e-cigarette will last her 2-3 weeks. She started smoking at age 41 She was previously smoking 2 packs per day. She quit smoking for 6 months in 2020 after suffering MI. She lives with her sister who smokes. She grew up with significant second hand smoke exposure.   Past Medical History:  Diagnosis Date   Anxiety    Asthma    Bipolar 1 disorder ([place]    Bipolar depression ([place] 03/20/2013   Carpal tunnel syndrome 02/04/2013   Formatting of this note might be different from the original. 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 [facility]  Pt states that all problems are still present.  [facility]u [facility]t.  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 10/14/2018   Degenerative disc  disease, lumbar 06/05/2015   Formatting of this note might be different from the original. L4-5 > L3-4, mild   Depression    patient reports bipolor   Depression    Diabetic neuropathy ([place]    DKA (diabetic ketoacidoses) 03/19/2013   History of adenomatous polyp of colon 04/17/2015   Formatting of this note might be different from the original. Per path 2016   Hyperlipidemia LDL goal <70 10/04/2018   Hypokalemia 11/15/2014   Ingrown toenail of both feet 04/12/2019   Formatting of this note might be different from the original. great toenails bilateral   Insulin dependent diabetes mellitus with complications    insulin dependant   Jaw pain 02/24/2020   Leukocytosis 03/20/2013   Myocardial infarction ([place] 10/02/2018   Neuropathy    Non-ST elevation (NSTEMI) myocardial infarction ([place] 10/02/2018   NSTEMI (non-ST elevated myocardial infarction) ([place] 10/02/2018   Superficial incisional surgical site infection 11/14/2014   Syncopal episodes    Tobacco abuse 03/20/2013   Urinary retention 11/14/2014     Family History  Problem Relation Age of Onset   Heart attack Father 513  Diabetes Sister    Heart attack Brother 29   Heart disease Brother    Diabetes Maternal Grandmother    COPD Paternal Grandmother      Social History   Socioeconomic History   Marital status: Widowed    Spouse name: Not on file  Number of children: 2   Years of education: Not on file   Highest education level: Not on file  Occupational History   Occupation: Disabled  Tobacco Use   Smoking status: Every Day    Packs/day: 0.25    Types: Cigarettes   Smokeless tobacco: Never   Tobacco comments:    2-3 cigarettes per day  Vaping Use   Vaping Use: Never used  Substance and Sexual Activity   Alcohol use: Not Currently    Comment: occasional   Drug use: No   Sexual activity: Yes    Comment: tubal ligation  Other Topics Concern   Not on file  Social History Narrative   Not on file   Social Determinants of  Health   Financial Resource Strain: Not on file  Food Insecurity: Not on file  Transportation Needs: Not on file  Physical Activity: Not on file  Stress: Not on file  Social Connections: Not on file  Intimate Partner Violence: Not on file     Allergies  Allergen Reactions   Amoxicillin Itching   Penicillins Hives, Itching and Swelling    Did it involve swelling of the face/tongue/throat, SOB, or low BP? No Did it involve sudden or severe rash/hives, skin peeling, or any reaction on the inside of your mouth or nose? No Did you need to seek medical attention at a hospital or doctor's office? No When did it last happen?      Childhood If all above answers are "NO", may proceed with cephalosporin use.   Prednisone Itching and Swelling     Outpatient Medications Prior to Visit  Medication Sig Dispense Refill   acetaminophen (TYLENOL) 325 MG tablet Take 650 mg by mouth every 6 (six) hours as needed for moderate pain or headache.      albuterol (PROVENTIL) (2.5 MG/3ML) 0.083% nebulizer solution Take 2.5 mg by nebulization as needed for wheezing or shortness of breath.     aspirin EC 81 MG EC tablet Take 1 tablet (81 mg total) by mouth daily.     atorvastatin (LIPITOR) 80 MG tablet Take 1 tablet (80 mg total) by mouth daily at 6 PM. 90 tablet 3   benzonatate (TESSALON) 100 MG capsule Take 100-200 mg by mouth 3 (three) times daily as needed.     BREO ELLIPTA 100-25 MCG/INH AEPB Inhale 1 puff into the lungs daily. 28 each 11   clopidogrel (PLAVIX) 75 MG tablet TAKE 1 TABLET BY MOUTH EVERY DAY (Patient taking differently: Take 75 mg by mouth daily.) 90 tablet 2   Continuous Blood Gluc Sensor (DEXCOM G6 SENSOR) MISC 1 Device by Does not apply route See admin instructions. Change every 10 days 9 each 3   fluticasone (FLONASE) 50 MCG/ACT nasal spray Place 2 sprays into both nostrils daily. 2 sprays each nostril at night 16 g 6   Fluticasone-Salmeterol (ADVAIR) 250-50 MCG/DOSE AEPB Inhale 1 puff  into the lungs 2 (two) times daily.     Glucagon, rDNA, (GLUCAGON EMERGENCY) 1 MG KIT Inject 1 mg as directed as needed. for low blood sugar episodes (Patient taking differently: Inject 1 mg as directed as needed (Low Blood Sugar). for low blood sugar episodes) 1 kit 11   insulin aspart (NOVOLOG) 100 UNIT/ML injection For use in pump, total of 70 units per day 70 mL 3   Insulin Disposable Pump (OMNIPOD 5 G6 POD, GEN 5,) MISC 1 Device by Does not apply route every 3 (three) days. 30 each 3   Insulin  Glargine (BASAGLAR KWIKPEN) 100 UNIT/ML Inject 30 Units into the skin daily. 9 mL 11   isosorbide mononitrate (IMDUR) 30 MG 24 hr tablet Take 30 mg by mouth daily.     loperamide (IMODIUM) 2 MG capsule Take 2 mg by mouth as needed for diarrhea or loose stools.     metoprolol tartrate (LOPRESSOR) 25 MG tablet Take 1 tablet (25 mg total) by mouth 2 (two) times daily. 180 tablet 3   montelukast (SINGULAIR) 10 MG tablet Take 10 mg by mouth at bedtime.     Multiple Vitamin (MULTIVITAMIN WITH MINERALS) TABS Take 1 tablet by mouth daily. Unknown strength     nitroGLYCERIN (NITROSTAT) 0.4 MG SL tablet PLACE 1 TABLET UNDER THE TONGUE EVERY 5 MINUTES X 3 DOSES AS NEEDED FOR CHEST PAIN. MAX3TAB/15MIN (Patient taking differently: Place 0.4 mg under the tongue every 5 (five) minutes as needed for chest pain. PLACE 1 TABLET UNDER THE TONGUE EVERY 5 MINUTES X 3 DOSES AS NEEDED FOR CHEST PAIN. MAX3TAB/15MIN) 25 tablet 3   pantoprazole (PROTONIX) 40 MG tablet Take 1 tablet (40 mg total) by mouth daily. 90 tablet 3   promethazine-dextromethorphan (PROMETHAZINE-DM) 6.25-15 MG/5ML syrup Take 5 mLs by mouth every 4 (four) hours as needed.     triamcinolone cream (KENALOG) 0.1 % Apply 1 application topically 2 (two) times daily. 30 g 0   VENTOLIN HFA 108 (90 Base) MCG/ACT inhaler Inhale 2 puffs into the lungs every 4 (four) hours as needed for wheezing or shortness of breath.     No facility-administered medications prior to  visit.   Review of Systems  Constitutional:  Negative for chills, fever, malaise/fatigue and weight loss.  HENT:  Positive for congestion. Negative for sinus pain and sore throat.   Eyes: Negative.   Respiratory:  Positive for cough and shortness of breath. Negative for hemoptysis, sputum production and wheezing.   Cardiovascular:  Negative for chest pain, palpitations, orthopnea, claudication and leg swelling.  Gastrointestinal:  Negative for abdominal pain, heartburn, nausea and vomiting.  Genitourinary: Negative.   Musculoskeletal:  Negative for joint pain and myalgias.  Skin:  Negative for rash.  Neurological:  Negative for weakness.  Endo/Heme/Allergies:  Positive for environmental allergies.  Psychiatric/Behavioral: Negative.     Objective:   Vitals:   03/02/21 1011  BP: 112/64  Pulse: 88  Temp: 97.8 F (36.6 C)  TempSrc: Oral  SpO2: 99%  Weight: 134 lb (60.8 kg)  Height: '5\' 4"'  (1.626 m)    Physical Exam Constitutional:      General: She is not in acute distress.    Appearance: She is not ill-appearing.  HENT:     Head: Normocephalic and atraumatic.  Eyes:     General: No scleral icterus.    Conjunctiva/sclera: Conjunctivae normal.     Pupils: Pupils are equal, round, and reactive to light.  Cardiovascular:     Rate and Rhythm: Normal rate and regular rhythm.     Pulses: Normal pulses.     Heart sounds: Normal heart sounds. No murmur heard. Pulmonary:     Effort: Pulmonary effort is normal.     Breath sounds: Normal breath sounds. Decreased air movement present. No wheezing, rhonchi or rales.  Abdominal:     General: Bowel sounds are normal.     Palpations: Abdomen is soft.  Musculoskeletal:     Right lower leg: No edema.     Left lower leg: No edema.  Lymphadenopathy:     Cervical: No cervical adenopathy.  Skin:    General: Skin is warm and dry.  Neurological:     General: No focal deficit present.     Mental Status: She is alert.  Psychiatric:         Mood and Affect: Mood normal.        Behavior: Behavior normal.        Thought Content: Thought content normal.        Judgment: Judgment normal.   CBC    Component Value Date/Time   WBC 7.7 01/05/2021 1655   RBC 4.48 01/05/2021 1655   HGB 13.1 01/05/2021 1655   HCT 39.7 01/05/2021 1655   PLT 227 01/05/2021 1655   MCV 88.6 01/05/2021 1655   MCH 29.2 01/05/2021 1655   MCHC 33.0 01/05/2021 1655   RDW 12.6 01/05/2021 1655   LYMPHSABS 2.4 01/05/2021 1655   MONOABS 0.4 01/05/2021 1655   EOSABS 0.1 01/05/2021 1655   BASOSABS 0.1 01/05/2021 1655   BMP Latest Ref Rng & Units 01/05/2021 01/04/2021 07/12/2020  Glucose 70 - 99 mg/dL 236(H) 76 54(L)  BUN 6 - 20 mg/dL 5(L) 5(L) 10  Creatinine 0.44 - 1.00 mg/dL 0.64 0.44(L) 0.71  BUN/Creat Ratio 9 - 23 - 11 -  Sodium 135 - 145 mmol/L 137 136 139  Potassium 3.5 - 5.1 mmol/L 3.8 4.3 3.6  Chloride 98 - 111 mmol/L 104 99 102  CO2 22 - 32 mmol/L 26 14(L) 27  Calcium 8.9 - 10.3 mg/dL 8.9 9.7 9.9   Chest imaging: CTA Chest 01/08/21 Cardiovascular: Satisfactory opacification of the pulmonary arteries to the segmental level. No evidence of pulmonary embolism. Thoracic aorta is normal in course and caliber. Normal heart size. No pericardial effusion.   Mediastinum/Nodes: No enlarged mediastinal, hilar, or axillary lymph nodes. Thyroid gland, trachea, and esophagus demonstrate no significant findings.   Lungs/Pleura: No focal airspace consolidation. No pleural effusion or pneumothorax.  CT Chest 2021 Mild apical predominant emphysema.  PFT: No flowsheet data found.  Labs:  Path:  Echo 10/17/20: LV EF 60-65%. Grade I diastolic dysfunction. RV systolic function is normal.   Heart Catheterization:  Assessment & Plan:   Moderate persistent asthma, unspecified whether complicated  [id number]-96 virus infection  Discussion: Melody Myers is a 41 year old woman, daily smoker with history of asthma, MI s/p stent placement in 2020 and  covid infections x 2 who is referred to pulmonary clinic with cough and dyspnea.   She has moderate persistent asthma with evidence of apical emphysema on CT chest scans.   We will trial her on trelegy ellipta inhaler 1 puff daily and as needed albuterol. She is to stop advair and breo ellipta inhalers.   We discussed smoking cessation options today. She is interested in medical therapy. We will start her on chantix starter pak along with nicotine replacement via patches and lozenges.   We will schedule her for PFTs and follow up in 2 months.   Freda Jackson, MD [facility] Office: [phone]    Current Outpatient Medications:    acetaminophen (TYLENOL) 325 MG tablet, Take 650 mg by mouth every 6 (six) hours as needed for moderate pain or headache. , Disp: , Rfl:    albuterol (PROVENTIL) (2.5 MG/3ML) 0.083% nebulizer solution, Take 2.5 mg by nebulization as needed for wheezing or shortness of breath., Disp: , Rfl:    aspirin EC 81 MG EC tablet, Take 1 tablet (81 mg total) by mouth daily., Disp:  , Rfl:    atorvastatin (  LIPITOR) 80 MG tablet, Take 1 tablet (80 mg total) by mouth daily at 6 PM., Disp: 90 tablet, Rfl: 3   benzonatate (TESSALON) 100 MG capsule, Take 100-200 mg by mouth 3 (three) times daily as needed., Disp: , Rfl:    BREO ELLIPTA 100-25 MCG/INH AEPB, Inhale 1 puff into the lungs daily., Disp: 28 each, Rfl: 11   clopidogrel (PLAVIX) 75 MG tablet, TAKE 1 TABLET BY MOUTH EVERY DAY (Patient taking differently: Take 75 mg by mouth daily.), Disp: 90 tablet, Rfl: 2   Continuous Blood Gluc Sensor (DEXCOM G6 SENSOR) MISC, 1 Device by Does not apply route See admin instructions. Change every 10 days, Disp: 9 each, Rfl: 3   fluticasone (FLONASE) 50 MCG/ACT nasal spray, Place 2 sprays into both nostrils daily. 2 sprays each nostril at night, Disp: 16 g, Rfl: 6   Fluticasone-Salmeterol (ADVAIR) 250-50 MCG/DOSE AEPB, Inhale 1 puff into the lungs 2 (two) times  daily., Disp: , Rfl:    Glucagon, rDNA, (GLUCAGON EMERGENCY) 1 MG KIT, Inject 1 mg as directed as needed. for low blood sugar episodes (Patient taking differently: Inject 1 mg as directed as needed (Low Blood Sugar). for low blood sugar episodes), Disp: 1 kit, Rfl: 11   insulin aspart (NOVOLOG) 100 UNIT/ML injection, For use in pump, total of 70 units per day, Disp: 70 mL, Rfl: 3   Insulin Disposable Pump (OMNIPOD 5 G6 POD, GEN 5,) MISC, 1 Device by Does not apply route every 3 (three) days., Disp: 30 each, Rfl: 3   Insulin Glargine (BASAGLAR KWIKPEN) 100 UNIT/ML, Inject 30 Units into the skin daily., Disp: 9 mL, Rfl: 11   isosorbide mononitrate (IMDUR) 30 MG 24 hr tablet, Take 30 mg by mouth daily., Disp: , Rfl:    loperamide (IMODIUM) 2 MG capsule, Take 2 mg by mouth as needed for diarrhea or loose stools., Disp: , Rfl:    metoprolol tartrate (LOPRESSOR) 25 MG tablet, Take 1 tablet (25 mg total) by mouth 2 (two) times daily., Disp: 180 tablet, Rfl: 3   montelukast (SINGULAIR) 10 MG tablet, Take 10 mg by mouth at bedtime., Disp: , Rfl:    Multiple Vitamin (MULTIVITAMIN WITH MINERALS) TABS, Take 1 tablet by mouth daily. Unknown strength, Disp: , Rfl:    nitroGLYCERIN (NITROSTAT) 0.4 MG SL tablet, PLACE 1 TABLET UNDER THE TONGUE EVERY 5 MINUTES X 3 DOSES AS NEEDED FOR CHEST PAIN. MAX3TAB/15MIN (Patient taking differently: Place 0.4 mg under the tongue every 5 (five) minutes as needed for chest pain. PLACE 1 TABLET UNDER THE TONGUE EVERY 5 MINUTES X 3 DOSES AS NEEDED FOR CHEST PAIN. MAX3TAB/15MIN), Disp: 25 tablet, Rfl: 3   pantoprazole (PROTONIX) 40 MG tablet, Take 1 tablet (40 mg total) by mouth daily., Disp: 90 tablet, Rfl: 3   promethazine-dextromethorphan (PROMETHAZINE-DM) 6.25-15 MG/5ML syrup, Take 5 mLs by mouth every 4 (four) hours as needed., Disp: , Rfl:    triamcinolone cream (KENALOG) 0.1 %, Apply 1 application topically 2 (two) times daily., Disp: 30 g, Rfl: 0   VENTOLIN HFA 108 (90 Base)  MCG/ACT inhaler, Inhale 2 puffs into the lungs every 4 (four) hours as needed for wheezing or shortness of breath., Disp: , Rfl:

## 2021-03-02 NOTE — Patient Instructions (Addendum)
Start trelegy ellipta inhaler 1 puff daily  Stop using Breo and Advair inhalers  Continue to use albuterol inhaler or nebulizer treatments as needed.  Recommend quitting smoking.  Start varenicline (chantix) starter pack as directed. Please notify our clinic if you need refills for continuation of medication.   Use nicotine patches 14mg  daily and as needed nicotine lozenges.   2 weeks after starting chantix and nicotine patches you will need to set a definite quit date.  Follow up in 2 months with PFTs

## 2021-03-05 ENCOUNTER — Encounter: Payer: Self-pay | Admitting: Family Medicine

## 2021-03-05 ENCOUNTER — Telehealth: Payer: Self-pay | Admitting: Family Medicine

## 2021-03-05 NOTE — Telephone Encounter (Signed)
Pt has now had 3 no show/same day cancellations, letter mailed since 1 was due to pt being in the hospital.

## 2021-03-12 ENCOUNTER — Ambulatory Visit: Payer: Medicare Other | Admitting: Cardiology

## 2021-03-15 ENCOUNTER — Ambulatory Visit: Payer: Medicare Other | Admitting: Endocrinology

## 2021-03-28 ENCOUNTER — Ambulatory Visit: Payer: Medicare Other | Admitting: Cardiology

## 2021-04-05 ENCOUNTER — Ambulatory Visit (INDEPENDENT_AMBULATORY_CARE_PROVIDER_SITE_OTHER): Payer: Medicare Other | Admitting: Endocrinology

## 2021-04-05 ENCOUNTER — Other Ambulatory Visit: Payer: Self-pay

## 2021-04-05 VITALS — BP 108/64 | HR 82 | Ht 64.0 in | Wt 134.8 lb

## 2021-04-05 DIAGNOSIS — E1065 Type 1 diabetes mellitus with hyperglycemia: Secondary | ICD-10-CM | POA: Diagnosis not present

## 2021-04-05 DIAGNOSIS — E1040 Type 1 diabetes mellitus with diabetic neuropathy, unspecified: Secondary | ICD-10-CM

## 2021-04-05 LAB — POCT GLYCOSYLATED HEMOGLOBIN (HGB A1C): Hemoglobin A1C: 8.6 % — AB (ref 4.0–5.6)

## 2021-04-05 MED ORDER — DEXCOM G6 RECEIVER DEVI: 1.0000 | Freq: Once | 1 refills | Status: AC

## 2021-04-05 MED ORDER — INSULIN ASPART 100 UNIT/ML IJ SOLN: INTRAMUSCULAR | 3 refills | Status: DC

## 2021-04-05 MED ORDER — BASAGLAR KWIKPEN 100 UNIT/ML ~~LOC~~ SOPN: 30.0000 [IU] | PEN_INJECTOR | Freq: Every day | SUBCUTANEOUS | 11 refills | Status: DC

## 2021-04-05 NOTE — Patient Instructions (Addendum)
check your blood sugar 4 times a day.  vary the time of day when you check, between before the 3 meals, and at bedtime.  also check if you have symptoms of your blood sugar being too high or too low.  please keep a record of the readings and bring it to your next appointment here (or you can bring the meter itself).  You can write it on any piece of paper.  please call us sooner if your blood sugar goes below 70, or if most of your readings are over 200.  I have sent a prescription to [facility] health care, for the [facility] receiver. Please see a pump trainer, to start the Tandem pump. You should reduce the supper Novolog to 6 units Please come back for a follow-up appointment in 2 months.

## 2021-04-05 NOTE — Progress Notes (Signed)
Subjective:    Patient ID: Melody Myers, female    DOB: 1980-09-20, 41 y.o.   MRN: [id number]  HPI Pt returns for f/u of diabetes mellitus: DM type: 1 Dx'ed: 7619 Complications: CAD, foot ulcer, NPDR, GP, and PPN Therapy: insulin since dx GDM: never DKA: Last episode was 2016. Severe hypoglycemia: last episode was 2022 Pancreatitis: never Pancreatic imaging: normal on 2021 CT SDOH: none Other: She took pump rx 2013-early 2022 (stopped due to gap in endocrinologist); Pt says she is not at risk for pregnancy.  Interval history: She Novolog 10 units 3 times a day (just before each meal).  Pt says she had to d/c omnipod pumps because she has lost several cell phones, and part B provider told her it is too soon to get another Programme researcher, broadcasting/film/video.  She does not check cbg. She has hypoglycemia sxs approx QOD.  This happens at HS. Past Medical History:  Diagnosis Date   Anxiety    Asthma    Bipolar 1 disorder ([place])    Bipolar depression ([place]) 03/20/2013   Carpal tunnel syndrome 02/04/2013   Formatting of this note might be different from the original. 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 Repaired at [facility] in Sept 2016.  Pt states that all problems are still present.  If followed by [facility] for [facility]. She has f/u appt.  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 10/14/2018   Degenerative disc disease, lumbar 06/05/2015   Formatting of this note might be different from the original. L4-5 > L3-4, mild   Depression    patient reports bipolor   Depression    Diabetic neuropathy ([place])    DKA (diabetic ketoacidoses) 03/19/2013   History of adenomatous polyp of colon 04/17/2015   Formatting of this note might be different from the original. Per path 2016    Hyperlipidemia LDL goal <70 10/04/2018   Hypokalemia 11/15/2014   Ingrown toenail of both feet 04/12/2019   Formatting of this note might be different from the original. great toenails bilateral   Insulin dependent diabetes mellitus with complications    insulin dependant   Jaw pain 02/24/2020   Leukocytosis 03/20/2013   Myocardial infarction ([place]) 10/02/2018   Neuropathy    Non-ST elevation (NSTEMI) myocardial infarction ([place]) 10/02/2018   NSTEMI (non-ST elevated myocardial infarction) ([place]) 10/02/2018   Superficial incisional surgical site infection 11/14/2014   Syncopal episodes    Tobacco abuse 03/20/2013   Urinary retention 11/14/2014    Past Surgical History:  Procedure Laterality Date   CORONARY STENT INTERVENTION N/A 10/02/2018   Procedure: CORONARY STENT INTERVENTION;  Surgeon: Nelva Bush, MD;  Location: [facility];  Service: Cardiovascular;  Laterality: N/A;   LEFT HEART CATH AND CORONARY ANGIOGRAPHY N/A 10/02/2018   Procedure: LEFT HEART CATH AND CORONARY ANGIOGRAPHY;  Surgeon: Nelva Bush, MD;  Location: [facility];  Service: Cardiovascular;  Laterality: N/A;   TUBAL LIGATION     VAGINA RECONSTRUCTION SURGERY  10/2014   & ana, reconstruction    Social History   Socioeconomic History   Marital status: Widowed    Spouse name: Not on file   Number of children: 2   Years of education: Not on file   Highest education level: Not on  file  Occupational History   Occupation: Disabled  Tobacco Use   Smoking status: Every Day    Packs/day: 0.25    Types: Cigarettes   Smokeless tobacco: Never   Tobacco comments:    2-3 cigarettes per day  Vaping Use   Vaping Use: Never used  Substance and Sexual Activity   Alcohol use: Not Currently    Comment: occasional   Drug use: No   Sexual activity: Yes    Comment: tubal ligation  Other Topics Concern   Not on file  Social History Narrative   Not on file   Social Determinants of Health   Financial Resource  Strain: Not on file  Food Insecurity: Not on file  Transportation Needs: Not on file  Physical Activity: Not on file  Stress: Not on file  Social Connections: Not on file  Intimate Partner Violence: Not on file    Current Outpatient Medications on File Prior to Visit  Medication Sig Dispense Refill   acetaminophen (TYLENOL) 325 MG tablet Take 650 mg by mouth every 6 (six) hours as needed for moderate pain or headache.      albuterol (PROVENTIL) (2.5 MG/3ML) 0.083% nebulizer solution Take 3 mLs (2.5 mg total) by nebulization every 6 (six) hours as needed for wheezing or shortness of breath. 75 mL 3   aspirin EC 81 MG EC tablet Take 1 tablet (81 mg total) by mouth daily.     atorvastatin (LIPITOR) 80 MG tablet Take 1 tablet (80 mg total) by mouth daily at 6 PM. 90 tablet 3   benzonatate (TESSALON) 100 MG capsule Take 100-200 mg by mouth 3 (three) times daily as needed.     clopidogrel (PLAVIX) 75 MG tablet TAKE 1 TABLET BY MOUTH EVERY DAY (Patient taking differently: Take 75 mg by mouth daily.) 90 tablet 2   Continuous Blood Gluc Sensor (DEXCOM G6 SENSOR) MISC 1 Device by Does not apply route See admin instructions. Change every 10 days 9 each 3   fluticasone (FLONASE) 50 MCG/ACT nasal spray Place 2 sprays into both nostrils daily. 2 sprays each nostril at night 16 g 6   Fluticasone-Umeclidin-Vilant (TRELEGY ELLIPTA) 100-62.5-25 MCG/ACT AEPB Inhale 1 puff into the lungs daily. 28 each 6   Glucagon, rDNA, (GLUCAGON EMERGENCY) 1 MG KIT Inject 1 mg as directed as needed. for low blood sugar episodes (Patient taking differently: Inject 1 mg as directed as needed (Low Blood Sugar). for low blood sugar episodes) 1 kit 11   isosorbide mononitrate (IMDUR) 30 MG 24 hr tablet Take 30 mg by mouth daily.     loperamide (IMODIUM) 2 MG capsule Take 2 mg by mouth as needed for diarrhea or loose stools.     metoprolol tartrate (LOPRESSOR) 25 MG tablet Take 1 tablet (25 mg total) by mouth 2 (two) times daily.  180 tablet 3   montelukast (SINGULAIR) 10 MG tablet Take 10 mg by mouth at bedtime.     Multiple Vitamin (MULTIVITAMIN WITH MINERALS) TABS Take 1 tablet by mouth daily. Unknown strength     nitroGLYCERIN (NITROSTAT) 0.4 MG SL tablet PLACE 1 TABLET UNDER THE TONGUE EVERY 5 MINUTES X 3 DOSES AS NEEDED FOR CHEST PAIN. MAX3TAB/15MIN (Patient taking differently: Place 0.4 mg under the tongue every 5 (five) minutes as needed for chest pain. PLACE 1 TABLET UNDER THE TONGUE EVERY 5 MINUTES X 3 DOSES AS NEEDED FOR CHEST PAIN. MAX3TAB/15MIN) 25 tablet 3   pantoprazole (PROTONIX) 40 MG tablet Take 1 tablet (40 mg total)  by mouth daily. 90 tablet 3   promethazine-dextromethorphan (PROMETHAZINE-DM) 6.25-15 MG/5ML syrup Take 5 mLs by mouth every 4 (four) hours as needed.     triamcinolone cream (KENALOG) 0.1 % Apply 1 application topically 2 (two) times daily. 30 g 0   varenicline (CHANTIX PAK) 0.5 MG X 11 & 1 MG X 42 tablet Take one 0.5 mg tablet by mouth once daily for 3 days, then increase to one 0.5 mg tablet twice daily for 4 days, then increase to one 1 mg tablet twice daily. 53 tablet 0   VENTOLIN HFA 108 (90 Base) MCG/ACT inhaler Inhale 2 puffs into the lungs every 4 (four) hours as needed for wheezing or shortness of breath.     No current facility-administered medications on file prior to visit.      Family History  Problem Relation Age of Onset   Heart attack Father 32   Diabetes Sister    Heart attack Brother 50   Heart disease Brother    Diabetes Maternal Grandmother    COPD Paternal Grandmother     BP 108/64    Pulse 82    Ht 5' 4" (1.626 m)    Wt 134 lb 12.8 oz (61.1 kg)    SpO2 97%    BMI 23.14 kg/m    Review of Systems     Objective:   Physical Exam    Lab Results  Component Value Date   HGBA1C 8.6 (A) 04/05/2021      Assessment & Plan:  Type 1 DM:  uncontrolled  Patient Instructions  check your blood sugar 4 times a day.  vary the time of day when you check, between  before the 3 meals, and at bedtime.  also check if you have symptoms of your blood sugar being too high or too low.  please keep a record of the readings and bring it to your next appointment here (or you can bring the meter itself).  You can write it on any piece of paper.  please call us sooner if your blood sugar goes below 70, or if most of your readings are over 200.  I have sent a prescription to [facility] care, for the [facility] receiver. Please see a pump trainer, to start the Tandem pump. You should reduce the supper Novolog to 6 units Please come back for a follow-up appointment in 2 months.

## 2021-04-10 ENCOUNTER — Telehealth: Payer: Self-pay

## 2021-04-10 NOTE — Telephone Encounter (Signed)
LVM for pt to let her know that I received a message from Nuck from [facility] [employer] regarding her dexcom G6 sensors. They said that they have made several attempt to contact her with no response and that they will not be reaching out to her anymore. She can give them a call @ [phone]  Ext [phone] and ask for Merrilee Seashore.

## 2021-04-16 ENCOUNTER — Encounter: Payer: Medicare Other | Attending: Endocrinology | Admitting: Nutrition

## 2021-04-16 ENCOUNTER — Other Ambulatory Visit: Payer: Self-pay

## 2021-04-16 DIAGNOSIS — Z713 Dietary counseling and surveillance: Secondary | ICD-10-CM | POA: Diagnosis not present

## 2021-04-16 DIAGNOSIS — E1040 Type 1 diabetes mellitus with diabetic neuropathy, unspecified: Secondary | ICD-10-CM | POA: Diagnosis not present

## 2021-04-17 NOTE — Patient Instructions (Signed)
Read over pump manuel again and call the help line if questions.

## 2021-04-17 NOTE — Progress Notes (Signed)
Patient is wanting to go back on her Tandem pump, because she "could not keep up the the PDM.  She was worked in today, because she says she has not been on basaglar insulin for 3 days, and needs help. Settings were transferred from Dr. George Hugh last note while on OmniPod 5:  Basal rate:   0.8u/hr, ISF: 100,  I/C: 20, target: 110.  Sleep schedule set from 9PM to 6AM.  She was started on the Dexcom and this was linked to her pump.  However, her phone is not able to accept the Dexcom app, and am not certain this will transfer to her pump.  She was told to find an old phone from a friend, get w-fi and download the dexcom G6 mobile app , turn on blue tooth, and this will allow for the readings to go the pump.  She agreed to do this.  She has not pump supplies, and was given a month's worth of infusion sets and cartridges.  Says she has called [facility], to start getting these supplies and reports that they have sent paperwork to this office for both the Dexcom and pump supplies,  Message to [place] for these was sent.   She filled a cartridge with Novolog insulin, and attached an infusion set to her abdomen.  She did not have a meter, or test strips and was given a One Touch Verio.  Says has not tested her blood sugar in days, nor given any Basaglar insulin for 3 days.  Blood sugar was 267, and she was shown how to do a correcttion dose at this time. We reviewed the need to count carbs and she says she does this, and was shown again how to bolus, and was told to review the manual one more time.  She agreed to do this and had no final questions.

## 2021-04-27 ENCOUNTER — Other Ambulatory Visit: Payer: Self-pay

## 2021-04-30 ENCOUNTER — Other Ambulatory Visit: Payer: Self-pay

## 2021-04-30 ENCOUNTER — Ambulatory Visit (INDEPENDENT_AMBULATORY_CARE_PROVIDER_SITE_OTHER): Payer: Medicare Other | Admitting: Family Medicine

## 2021-04-30 VITALS — BP 104/68 | HR 85 | Temp 97.5°F | Ht 64.0 in | Wt 136.4 lb

## 2021-04-30 DIAGNOSIS — J4540 Moderate persistent asthma, uncomplicated: Secondary | ICD-10-CM | POA: Diagnosis not present

## 2021-04-30 DIAGNOSIS — J309 Allergic rhinitis, unspecified: Secondary | ICD-10-CM | POA: Diagnosis not present

## 2021-04-30 DIAGNOSIS — E1042 Type 1 diabetes mellitus with diabetic polyneuropathy: Secondary | ICD-10-CM

## 2021-04-30 DIAGNOSIS — I252 Old myocardial infarction: Secondary | ICD-10-CM | POA: Insufficient documentation

## 2021-04-30 DIAGNOSIS — F17200 Nicotine dependence, unspecified, uncomplicated: Secondary | ICD-10-CM

## 2021-04-30 DIAGNOSIS — E1040 Type 1 diabetes mellitus with diabetic neuropathy, unspecified: Secondary | ICD-10-CM

## 2021-04-30 DIAGNOSIS — G43009 Migraine without aura, not intractable, without status migrainosus: Secondary | ICD-10-CM

## 2021-04-30 DIAGNOSIS — K219 Gastro-esophageal reflux disease without esophagitis: Secondary | ICD-10-CM | POA: Diagnosis not present

## 2021-04-30 DIAGNOSIS — I25119 Atherosclerotic heart disease of native coronary artery with unspecified angina pectoris: Secondary | ICD-10-CM

## 2021-04-30 DIAGNOSIS — F319 Bipolar disorder, unspecified: Secondary | ICD-10-CM

## 2021-04-30 HISTORY — DX: Old myocardial infarction: I25.2

## 2021-04-30 MED ORDER — FLUTICASONE PROPIONATE 50 MCG/ACT NA SUSP: 2.0000 | Freq: Every day | NASAL | 6 refills | Status: DC

## 2021-04-30 MED ORDER — RIZATRIPTAN BENZOATE 5 MG PO TABS: 5.0000 mg | ORAL_TABLET | ORAL | 2 refills | Status: DC | PRN

## 2021-04-30 MED ORDER — ONDANSETRON HCL 4 MG PO TABS: 4.0000 mg | ORAL_TABLET | Freq: Three times a day (TID) | ORAL | 2 refills | Status: DC | PRN

## 2021-04-30 MED ORDER — PANTOPRAZOLE SODIUM 40 MG PO TBEC: 40.0000 mg | DELAYED_RELEASE_TABLET | Freq: Every day | ORAL | 3 refills | Status: AC

## 2021-04-30 NOTE — Progress Notes (Signed)
[facility] [address] [place] [address] Dept: [phone] Dept Fax: [phone]  Chronic Care Office Visit  Subjective:    Patient ID: Melody Myers, female    DOB: 1980/10/16, 41 y.o..   MRN: [id number]  Chief Complaint  Patient presents with   Follow-up    3 month f/u.     History of Present Illness:  Patient is in today for reassessment of chronic medical issues.  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 She has not been well controlled. She continues to see Dr. Loanne Drilling (endocrinology) about this. She is currently on a tandem insulin pump, integrated with her CGM.   Melody Myers has a history of a prior MI and mild diastolic dysfunction. She is managed on aspirin, Plavix, and metoprolol. She notes she is taking NTG twice a day. She continues to smoke 2-3 cigarettes a day. She was prescribed Chantix. She notes the co-pay was $90, so she has been unable to afford this at this point.  Melody Myers has a history of moderate, persistent asthma. She is managed by Dr. Erin Fulling. He has her using Trelegy (ICS/LAMA/LABA), Singulair and albuterol (SABA). He plans to do PFTs later this week. Melody Myers notes that she has been needing her albuterol twice a day.  Melody Myers has hyperlipidemia managed with atorvastatin.   Melody Myers has had multiple GI issues. She notes she is not able to get back in with the gastroenterologist until later in March. She has not been on her PPI lately and has been out of her Zofran. She has had a mix of GERD, peptic ulcers, and some possible diabetic gastroparesis.  Melody Myers notes she has a history of migraine headaches. She notes that since starting on the Trelegy, her migraines have been flared up. She has been having these several times a week. She has taken Tylenol and Excedrin Migraine, but feels they don't work well. She gets nausea and photophobia  associated with these headaches. She was given a medication through the ER some years ago for acute migraine management (Maxalt?)  Past Medical History: Patient Active Problem List   Diagnosis Date Noted   History of non-ST elevation myocardial infarction (NSTEMI) 04/30/2021   Migraine without aura and without status migrainosus, not intractable 57/02/7791   Mild diastolic dysfunction 90/30/0923   Antral ulcer 10/27/2020   Allergic rhinitis 07/12/2020   Gastroesophageal reflux disease without esophagitis 07/12/2020   Jaw pain 02/24/2020   Syncopal episodes    Coronary artery disease- Severe single-vessel CAD with thrombotic occlusion of proximal OM1 10/14/2018   Hyperlipidemia LDL goal <70 10/04/2018   History of adenomatous polyp of colon 04/17/2015   Urinary retention 11/14/2014   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   Bipolar depression ([place]) 03/20/2013   Chronic pain 03/20/2013   Tobacco use disorder 03/20/2013   Diabetic neuropathy ([place])    Type 1 diabetes mellitus with diabetic neuropathy, unspecified ([place])    DKA (diabetic ketoacidoses) 03/19/2013   Asthma    Carpal tunnel syndrome 02/04/2013   Past Surgical History:  Procedure Laterality Date   CORONARY STENT INTERVENTION N/A 10/02/2018   Procedure: CORONARY STENT INTERVENTION;  Surgeon: Nelva Bush, MD;  Location: [facility];  Service: Cardiovascular;  Laterality: N/A;   LEFT HEART CATH AND CORONARY ANGIOGRAPHY N/A 10/02/2018   Procedure: LEFT HEART CATH AND CORONARY ANGIOGRAPHY;  Surgeon: Nelva Bush, MD;  Location: [facility];  Service: Cardiovascular;  Laterality:  N/A;   TUBAL LIGATION     VAGINA RECONSTRUCTION SURGERY  10/2014   & ana, reconstruction   Family History  Problem Relation Age of Onset   Heart attack Father 88   Diabetes Sister    Heart attack Brother 29   Heart disease Brother    Diabetes Maternal Grandmother    COPD Paternal Grandmother     Outpatient Medications Prior to Visit   Medication Sig Dispense Refill   acetaminophen (TYLENOL) 325 MG tablet Take 650 mg by mouth every 6 (six) hours as needed for moderate pain or headache.      albuterol (PROVENTIL) (2.5 MG/3ML) 0.083% nebulizer solution Take 3 mLs (2.5 mg total) by nebulization every 6 (six) hours as needed for wheezing or shortness of breath. 75 mL 3   aspirin EC 81 MG EC tablet Take 1 tablet (81 mg total) by mouth daily.     atorvastatin (LIPITOR) 80 MG tablet Take 1 tablet (80 mg total) by mouth daily at 6 PM. 90 tablet 3   clopidogrel (PLAVIX) 75 MG tablet TAKE 1 TABLET BY MOUTH EVERY DAY (Patient taking differently: Take 75 mg by mouth daily.) 90 tablet 2   Continuous Blood Gluc Sensor (DEXCOM G6 SENSOR) MISC 1 Device by Does not apply route See admin instructions. Change every 10 days 9 each 3   Fluticasone-Umeclidin-Vilant (TRELEGY ELLIPTA) 100-62.5-25 MCG/ACT AEPB Inhale 1 puff into the lungs daily. 28 each 6   Glucagon, rDNA, (GLUCAGON EMERGENCY) 1 MG KIT Inject 1 mg as directed as needed. for low blood sugar episodes (Patient taking differently: Inject 1 mg as directed as needed (Low Blood Sugar). for low blood sugar episodes) 1 kit 11   insulin aspart (NOVOLOG) 100 UNIT/ML injection 3 times a day (just before each meal) 10-10-6 units 70 mL 3   loperamide (IMODIUM) 2 MG capsule Take 2 mg by mouth as needed for diarrhea or loose stools.     metoprolol tartrate (LOPRESSOR) 25 MG tablet Take 1 tablet (25 mg total) by mouth 2 (two) times daily. 180 tablet 3   montelukast (SINGULAIR) 10 MG tablet Take 10 mg by mouth at bedtime.     Multiple Vitamin (MULTIVITAMIN WITH MINERALS) TABS Take 1 tablet by mouth daily. Unknown strength     nitroGLYCERIN (NITROSTAT) 0.4 MG SL tablet PLACE 1 TABLET UNDER THE TONGUE EVERY 5 MINUTES X 3 DOSES AS NEEDED FOR CHEST PAIN. MAX3TAB/15MIN (Patient taking differently: Place 0.4 mg under the tongue every 5 (five) minutes as needed for chest pain. PLACE 1 TABLET UNDER THE TONGUE EVERY  5 MINUTES X 3 DOSES AS NEEDED FOR CHEST PAIN. MAX3TAB/15MIN) 25 tablet 3   triamcinolone cream (KENALOG) 0.1 % Apply 1 application topically 2 (two) times daily. 30 g 0   varenicline (CHANTIX PAK) 0.5 MG X 11 & 1 MG X 42 tablet Take one 0.5 mg tablet by mouth once daily for 3 days, then increase to one 0.5 mg tablet twice daily for 4 days, then increase to one 1 mg tablet twice daily. 53 tablet 0   VENTOLIN HFA 108 (90 Base) MCG/ACT inhaler Inhale 2 puffs into the lungs every 4 (four) hours as needed for wheezing or shortness of breath.     fluticasone (FLONASE) 50 MCG/ACT nasal spray Place 2 sprays into both nostrils daily. 2 sprays each nostril at night 16 g 6   isosorbide mononitrate (IMDUR) 30 MG 24 hr tablet Take 30 mg by mouth daily.     pantoprazole (PROTONIX) 40  MG tablet Take 1 tablet (40 mg total) by mouth daily. 90 tablet 3   benzonatate (TESSALON) 100 MG capsule Take 100-200 mg by mouth 3 (three) times daily as needed.     Insulin Glargine (BASAGLAR KWIKPEN) 100 UNIT/ML Inject 30 Units into the skin daily. (Patient not taking: Reported on 04/30/2021) 9 mL 11   promethazine-dextromethorphan (PROMETHAZINE-DM) 6.25-15 MG/5ML syrup Take 5 mLs by mouth every 4 (four) hours as needed.     No facility-administered medications prior to visit.   Allergies  Allergen Reactions   Amoxicillin Itching   Penicillins Hives, Itching and Swelling    Did it involve swelling of the face/tongue/throat, SOB, or low BP? No Did it involve sudden or severe rash/hives, skin peeling, or any reaction on the inside of your mouth or nose? No Did you need to seek medical attention at a hospital or doctor's office? No When did it last happen?      Childhood If all above answers are "NO", may proceed with cephalosporin use.   Prednisone Itching and Swelling     Objective:   Today's Vitals   04/30/21 0945  BP: 104/68  Pulse: 85  Temp: (!) 97.5 F (36.4 C)  TempSrc: Temporal  SpO2: 96%  Weight: 136 lb 6.4  oz (61.9 kg)  Height: '5\' 4"'  (1.626 m)   Body mass index is 23.41 kg/m.   General: Well developed, well nourished. No acute distress. Lungs: Clear to auscultation bilaterally. No wheezing, rales or rhonchi. CV: RRR without murmurs or rubs. Pulses 2+ bilaterally. Psych: Alert and oriented. Normal mood and affect.  Health Maintenance Due  Topic Date Due   OPHTHALMOLOGY EXAM  04/25/2021     Assessment & Plan:   1. Coronary artery disease involving native coronary artery of native heart with angina pectoris (HCC) Stable angina. Review of cardiology notes would suggest this may be a non-cardiac issue. Melody Myers has a history of bipolar disorder. I suspect she has some uncontrolled anxiety/panic that she interprets as a cardiology or pulmonary issue. She will follow-up with cardiology on 3/10.  2. Moderate persistent asthma, unspecified whether complicated Lung exam today is clear. She will see pulmonary next week. Int he meantime continue Trelegy, Singulair, and albuterol.  3. Allergic rhinitis, unspecified seasonality, unspecified trigger Stable. Continue nasal steroid and Singulair.  - fluticasone (FLONASE) 50 MCG/ACT nasal spray; Place 2 sprays into both nostrils daily. 2 sprays each nostril at night  Dispense: 16 g; Refill: 6  4. Gastroesophageal reflux disease without esophagitis I will renew Protonix and Zofran.  - ondansetron (ZOFRAN) 4 MG tablet; Take 1 tablet (4 mg total) by mouth every 8 (eight) hours as needed for nausea or vomiting.  Dispense: 20 tablet; Refill: 2 - pantoprazole (PROTONIX) 40 MG tablet; Take 1 tablet (40 mg total) by mouth daily.  Dispense: 90 tablet; Refill: 3  5. Type 1 diabetes mellitus with diabetic neuropathy, unspecified ([place]) Following with Dr. Loanne Drilling. I reminded Melody Myers of the importance of her annual eye exam.  6. Diabetic polyneuropathy associated with type 1 diabetes mellitus ([place]) Stable. Continue to focus on optimal blood sugar  control.  7. Tobacco use disorder I encouraged Melody Myers to quit the light smoking that she continues to engage in.  8. Bipolar depression ([place]) Continue to advocate for her to follow-up with psychiatry.  9. Migraine without aura and without status migrainosus, not intractable We will try Maxalt for acute migraine treatment.  - rizatriptan (MAXALT) 5 MG tablet; Take 1 tablet (  5 mg total) by mouth as needed for migraine. May repeat in 2 hours if needed  Dispense: 10 tablet; Refill: 2   Return in about 3 months (around 07/31/2021) for Reassessment, fasting labs.   Haydee Salter, MD

## 2021-05-04 ENCOUNTER — Encounter: Payer: Self-pay | Admitting: Cardiology

## 2021-05-04 ENCOUNTER — Other Ambulatory Visit: Payer: Self-pay

## 2021-05-04 ENCOUNTER — Ambulatory Visit (INDEPENDENT_AMBULATORY_CARE_PROVIDER_SITE_OTHER): Payer: Medicare Other | Admitting: Cardiology

## 2021-05-04 VITALS — BP 124/72 | HR 79 | Ht 64.0 in | Wt 137.0 lb

## 2021-05-04 DIAGNOSIS — E785 Hyperlipidemia, unspecified: Secondary | ICD-10-CM

## 2021-05-04 DIAGNOSIS — E1040 Type 1 diabetes mellitus with diabetic neuropathy, unspecified: Secondary | ICD-10-CM

## 2021-05-04 DIAGNOSIS — I25119 Atherosclerotic heart disease of native coronary artery with unspecified angina pectoris: Secondary | ICD-10-CM | POA: Diagnosis not present

## 2021-05-04 DIAGNOSIS — F17200 Nicotine dependence, unspecified, uncomplicated: Secondary | ICD-10-CM

## 2021-05-04 MED ORDER — RANOLAZINE ER 500 MG PO TB12
500.0000 mg | ORAL_TABLET | Freq: Two times a day (BID) | ORAL | 2 refills | Status: DC
Start: 2021-05-04 — End: 2021-09-03

## 2021-05-04 NOTE — Patient Instructions (Signed)
Medication Instructions:  ?Your physician has recommended you make the following change in your medication:  ? ?START: Ranolazine 500 mg twice daily ? ?*If you need a refill on your cardiac medications before your next appointment, please call your pharmacy* ? ? ?Lab Work: ?None ?If you have labs (blood work) drawn today and your tests are completely normal, you will receive your results only by: ?MyChart Message (if you have MyChart) OR ?A paper copy in the mail ?If you have any lab test that is abnormal or we need to change your treatment, we will call you to review the results. ? ? ?Testing/Procedures: ?Your physician has requested that you have a lower or upper extremity arterial duplex. This test is an ultrasound of the arteries in the legs or arms. It looks at arterial blood flow in the legs and arms. Allow one hour for Lower and Upper Arterial scans. There are no restrictions or special instructions  ? ?Your physician has requested that you have a stress echocardiogram. For further information please visit [email or web address] Please follow instruction sheet as given.  ? ? ?Follow-Up: ?At [facility], you and your health needs are our priority.  As part of our continuing mission to provide you with exceptional heart care, we have created designated Provider Care Teams.  These Care Teams include your primary Cardiologist (physician) and Advanced Practice Providers (APPs -  Physician Assistants and Nurse Practitioners) who all work together to provide you with the care you need, when you need it. ? ?We recommend signing up for the patient portal called "MyChart".  Sign up information is provided on this After Visit Summary.  MyChart is used to connect with patients for Virtual Visits (Telemedicine).  Patients are able to view lab/test results, encounter notes, upcoming appointments, etc.  Non-urgent messages can be sent to your provider as well.   ?To learn more about what you can do with MyChart, go to  [email or web address].   ? ?Your next appointment:   ?3 month(s) ? ?The format for your next appointment:   ?In Person ? ?Provider:   ?Gypsy Balsam, MD  ? ? ?Other Instructions ?None ? ?

## 2021-05-04 NOTE — Progress Notes (Signed)
Cardiology Office Note:    Date:  05/04/2021   ID:  Melody Myers, DOB 01/29/1981, MRN [id number]  PCP:  Haydee Salter, MD  Cardiologist:  Jenne Campus, MD    Referring MD: Haydee Salter, MD   Chief Complaint  Patient presents with   Chest Pain    History of Present Illness:    Melody Myers is a 41 y.o. female  with past medical history significant for coronary artery disease.  In August 2020 she required PTCA and stenting of the obtuse marginal branch.  At the same time she was find to have moderate disease on the artery vessels.  Past medical history is also significant for poorly controlled diabetes, dyslipidemia.  Sadly she continues to smoke She comes today 2 months of follow-up.  Overall she says she still have episode of chest pain.  Sometimes few times a week she did take nitroglycerin with good relief pain can happen in different situations sometimes when she walks actually not when she walks but after she finished walking.  She also described to have some heaviness in her lower extremities with some pain in her calfs while walking.  Sadly she still continues to smoke.  Last time I seen her as scheduled to have a stress test, she canceled it because she got COVID infection.  Past Medical History:  Diagnosis Date   Anxiety    Asthma    Bipolar 1 disorder ([place])    Bipolar depression ([place]) 03/20/2013   Carpal tunnel syndrome 02/04/2013   Formatting of this note might be different from the original. IMPRESSION: Order right CTI 08144- ASAP   Chest pain 12/24/2018   Chronic pain 03/20/2013   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   Formatting of this note might be different from the original. Repaired at [facility] in Sept 2016.  Pt states that all problems are still present.  If followed by [facility] for this. She has f/u appt.  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 10/14/2018   Degenerative  disc disease, lumbar 06/05/2015   Formatting of this note might be different from the original. L4-5 > L3-4, mild   Depression    patient reports bipolor   Depression    Diabetic neuropathy ([place])    DKA (diabetic ketoacidoses) 03/19/2013   History of adenomatous polyp of colon 04/17/2015   Formatting of this note might be different from the original. Per path 2016   Hyperlipidemia LDL goal <70 10/04/2018   Hypokalemia 11/15/2014   Ingrown toenail of both feet 04/12/2019   Formatting of this note might be different from the original. great toenails bilateral   Insulin dependent diabetes mellitus with complications    insulin dependant   Jaw pain 02/24/2020   Leukocytosis 03/20/2013   Myocardial infarction ([place]) 10/02/2018   Neuropathy    Non-ST elevation (NSTEMI) myocardial infarction ([place]) 10/02/2018   NSTEMI (non-ST elevated myocardial infarction) ([place]) 10/02/2018   Superficial incisional surgical site infection 11/14/2014   Syncopal episodes    Tobacco abuse 03/20/2013   Urinary retention 11/14/2014    Past Surgical History:  Procedure Laterality Date   CORONARY STENT INTERVENTION N/A 10/02/2018   Procedure: CORONARY STENT INTERVENTION;  Surgeon: Nelva Bush, MD;  Location: [facility];  Service: Cardiovascular;  Laterality: N/A;   LEFT HEART CATH AND CORONARY ANGIOGRAPHY N/A 10/02/2018   Procedure: LEFT HEART CATH AND CORONARY ANGIOGRAPHY;  Surgeon: Nelva Bush, MD;  Location: [facility];  Service: Cardiovascular;  Laterality: N/A;   TUBAL LIGATION     VAGINA RECONSTRUCTION SURGERY  10/2014   & ana, reconstruction    Current Medications: Current Meds  Medication Sig   acetaminophen (TYLENOL) 325 MG tablet Take 650 mg by mouth every 6 (six) hours as needed for moderate pain or headache.    albuterol (PROVENTIL) (2.5 MG/3ML) 0.083% nebulizer solution Take 3 mLs (2.5 mg total) by nebulization every 6 (six) hours as needed for wheezing or shortness of breath.   aspirin  EC 81 MG EC tablet Take 1 tablet (81 mg total) by mouth daily.   atorvastatin (LIPITOR) 80 MG tablet Take 1 tablet (80 mg total) by mouth daily at 6 PM.   clopidogrel (PLAVIX) 75 MG tablet TAKE 1 TABLET BY MOUTH EVERY DAY (Patient taking differently: Take 75 mg by mouth daily.)   Continuous Blood Gluc Sensor (DEXCOM G6 SENSOR) MISC 1 Device by Does not apply route See admin instructions. Change every 10 days   fluticasone (FLONASE) 50 MCG/ACT nasal spray Place 2 sprays into both nostrils daily. 2 sprays each nostril at night   Fluticasone-Umeclidin-Vilant (TRELEGY ELLIPTA) 100-62.5-25 MCG/ACT AEPB Inhale 1 puff into the lungs daily.   Glucagon, rDNA, (GLUCAGON EMERGENCY) 1 MG KIT Inject 1 mg as directed as needed. for low blood sugar episodes (Patient taking differently: Inject 1 mg as directed as needed (Low Blood Sugar). for low blood sugar episodes)   insulin aspart (NOVOLOG) 100 UNIT/ML injection 3 times a day (just before each meal) 10-10-6 units   loperamide (IMODIUM) 2 MG capsule Take 2 mg by mouth as needed for diarrhea or loose stools.   montelukast (SINGULAIR) 10 MG tablet Take 10 mg by mouth at bedtime.   Multiple Vitamin (MULTIVITAMIN WITH MINERALS) TABS Take 1 tablet by mouth daily. Unknown strength   nitroGLYCERIN (NITROSTAT) 0.4 MG SL tablet PLACE 1 TABLET UNDER THE TONGUE EVERY 5 MINUTES X 3 DOSES AS NEEDED FOR CHEST PAIN. MAX3TAB/15MIN (Patient taking differently: Place 0.4 mg under the tongue every 5 (five) minutes as needed for chest pain. PLACE 1 TABLET UNDER THE TONGUE EVERY 5 MINUTES X 3 DOSES AS NEEDED FOR CHEST PAIN. MAX3TAB/15MIN)   ondansetron (ZOFRAN) 4 MG tablet Take 1 tablet (4 mg total) by mouth every 8 (eight) hours as needed for nausea or vomiting.   pantoprazole (PROTONIX) 40 MG tablet Take 1 tablet (40 mg total) by mouth daily.   rizatriptan (MAXALT) 5 MG tablet Take 1 tablet (5 mg total) by mouth as needed for migraine. May repeat in 2 hours if needed   triamcinolone  cream (KENALOG) 0.1 % Apply 1 application topically 2 (two) times daily.   varenicline (CHANTIX PAK) 0.5 MG X 11 & 1 MG X 42 tablet Take one 0.5 mg tablet by mouth once daily for 3 days, then increase to one 0.5 mg tablet twice daily for 4 days, then increase to one 1 mg tablet twice daily.   VENTOLIN HFA 108 (90 Base) MCG/ACT inhaler Inhale 2 puffs into the lungs every 4 (four) hours as needed for wheezing or shortness of breath.     Allergies:   Amoxicillin, Penicillins, and Prednisone   Social History   Socioeconomic History   Marital status: Widowed    Spouse name: Not on file   Number of children: 2   Years of education: Not on file   Highest education level: Not on file  Occupational History   Occupation: Disabled  Tobacco Use   Smoking status: Every Day    Packs/day: 0.25    Types: Cigarettes   Smokeless tobacco: Never   Tobacco comments:    2-3 cigarettes per day  Vaping Use   Vaping Use: Never used  Substance and Sexual Activity   Alcohol use: Not Currently    Comment: occasional   Drug use: No   Sexual activity: Yes    Comment: tubal ligation  Other Topics Concern   Not on file  Social History Narrative   Not on file   Social Determinants of Health   Financial Resource Strain: Not on file  Food Insecurity: Not on file  Transportation Needs: Not on file  Physical Activity: Not on file  Stress: Not on file  Social Connections: Not on file     Family History: The patient's family history includes COPD in her paternal grandmother; Diabetes in her maternal grandmother and sister; Heart attack (age of onset: 67) in her brother; Heart attack (age of onset: 47) in her father; Heart disease in her brother. ROS:   Please see the history of present illness.    All 14 point review of systems negative except as described per history of present illness  EKGs/Labs/Other Studies Reviewed:      Recent Labs: 07/12/2020: ALT 15 01/05/2021: B Natriuretic Peptide 447.3;  BUN 5; Creatinine, Ser 0.64; Hemoglobin 13.1; Platelets 227; Potassium 3.8; Sodium 137  Recent Lipid Panel    Component Value Date/Time   CHOL 100 06/19/2020 0844   TRIG 66 06/19/2020 0844   HDL 40 06/19/2020 0844   CHOLHDL 2.5 06/19/2020 0844   LDLCALC 46 06/19/2020 0844    Physical Exam:    VS:  BP 124/72 (BP Location: Right Arm, Patient Position: Sitting)    Pulse 79    Ht '5\' 4"'  (1.626 m)    Wt 137 lb (62.1 kg)    SpO2 97%    BMI 23.52 kg/m     Wt Readings from Last 3 Encounters:  05/04/21 137 lb (62.1 kg)  04/30/21 136 lb 6.4 oz (61.9 kg)  04/05/21 134 lb 12.8 oz (61.1 kg)     GEN:  Well nourished, well developed in no acute distress HEENT: Normal NECK: No JVD; No carotid bruits LYMPHATICS: No lymphadenopathy CARDIAC: RRR, no murmurs, no rubs, no gallops RESPIRATORY:  Clear to auscultation without rales, wheezing or rhonchi  ABDOMEN: Soft, non-tender, non-distended MUSCULOSKELETAL:  No edema; No deformity  SKIN: Warm and dry LOWER EXTREMITIES: no swelling NEUROLOGIC:  Alert and oriented x 3 PSYCHIATRIC:  Normal affect   ASSESSMENT:    1. Coronary artery disease involving native coronary artery of native heart with angina pectoris ([place])   2. Type 1 diabetes mellitus with diabetic neuropathy, unspecified ([place])   3. Hyperlipidemia LDL goal <70   4. Tobacco use disorder    PLAN:    In order of problems listed above:  Coronary artery disease status post PTCA and stenting of the obtuse marginal branch down to 2-1/2 years ago.  She does have some worrisome symptomatology.  She is on dual antiplatelets therapy which I elected to continue for now until we have clarification of her symptomatology.  She will be scheduled to have a stress echocardiogram to rule out significant inducible ischemia in the meantime I will add ranolazine 500 mg twice daily to her medical regimen.  She is taking nitroglycerin on as-needed basis but every single time she does that she will develop  headache therefore long-acting nitroglycerin  is not a good choice, she is already on beta-blocker which I will continue. Diabetes.  Her diabetes still not well controlled I did review hemoglobin A1c from April 05, 2021 8.6.  However she is working hard with her primary care physician to regulate this better. Dyslipidemia she is on high intense statin form of Lipitor 80 which I will continue.  I did review K PN which show me LDL 46 HDL 40 this is from 06/19/2020.  We will continue that management. Smoking still ongoing but she determined to quit smoking.  She was given some medication to help with this she is getting ready to pick it up.  I encouraged her to quit smoking. She was told if chest pain is not relieved by nitroglycerin she needs to go to the emergency room.   Medication Adjustments/Labs and Tests Ordered: Current medicines are reviewed at length with the patient today.  Concerns regarding medicines are outlined above.  No orders of the defined types were placed in this encounter.  Medication changes: No orders of the defined types were placed in this encounter.   Signed, Park Liter, MD, [facility] 05/04/2021 11:43 AM    [place]

## 2021-05-07 ENCOUNTER — Ambulatory Visit (INDEPENDENT_AMBULATORY_CARE_PROVIDER_SITE_OTHER): Payer: Medicare Other | Admitting: Pulmonary Disease

## 2021-05-07 ENCOUNTER — Other Ambulatory Visit: Payer: Self-pay

## 2021-05-07 ENCOUNTER — Encounter: Payer: Self-pay | Admitting: Pulmonary Disease

## 2021-05-07 VITALS — BP 114/72 | HR 85 | Ht 64.0 in | Wt 135.0 lb

## 2021-05-07 DIAGNOSIS — E1040 Type 1 diabetes mellitus with diabetic neuropathy, unspecified: Secondary | ICD-10-CM

## 2021-05-07 DIAGNOSIS — J4540 Moderate persistent asthma, uncomplicated: Secondary | ICD-10-CM

## 2021-05-07 DIAGNOSIS — J302 Other seasonal allergic rhinitis: Secondary | ICD-10-CM

## 2021-05-07 DIAGNOSIS — F17210 Nicotine dependence, cigarettes, uncomplicated: Secondary | ICD-10-CM

## 2021-05-07 LAB — PULMONARY FUNCTION TEST
DL/VA % pred: 75 %
DL/VA: 3.35 ml/min/mmHg/L
DLCO cor % pred: 69 %
DLCO cor: 15.34 ml/min/mmHg
DLCO unc % pred: 69 %
DLCO unc: 15.34 ml/min/mmHg
RV % pred: 305 %
RV: 4.73 L
TLC % pred: 160 %
TLC: 8.09 L

## 2021-05-07 MED ORDER — DEXCOM G6 SENSOR MISC: 1.0000 | 3 refills | Status: DC

## 2021-05-07 MED ORDER — MONTELUKAST SODIUM 10 MG PO TABS: 10.0000 mg | ORAL_TABLET | Freq: Every day | ORAL | 11 refills | Status: DC

## 2021-05-07 NOTE — Progress Notes (Signed)
Patient was only able to successfully complete lung volumes today. ?

## 2021-05-07 NOTE — Progress Notes (Unsigned)
Synopsis: Referred in January 2023 for dyspnea by Arlester Marker, MD  Subjective:   PATIENT ID: [name] GENDER: female DOB: [birth year], MRN: [id number]  3-4 cigaretes per day Picking up chantix this week Started on trelegy at last visit, breathing is easier.  Using albuterol 1-2 times per week Taking singulair daily No other allergy medicine Flonase 2 sprays per nostril   HPI  Chief Complaint  Patient presents with   Follow-up    F/U after PFT. States she was not able to complete her PFT due to allergies.    Jonia Oakey is a 41 year old woman, daily smoker with history of asthma, MI s/p stent placement in 2020 and covid infections x 2 who is referred to pulmonary clinic with cough and dyspnea.   She reports having cough and dyspnea prior to covid in 2021 and 01/2021, but after the infections her symptoms have worsened.   She is currently using breo ellipta 1 puff daily and advair diskus 250-82mg 1-2 puffs per day as needed. She is also using albuterol inhaler and nebulizer treatments as needed.   She continues to smoke 3-5 cigarettes per day and is using e-cigarette daily. An e-cigarette will last her 2-3 weeks. She started smoking at age 41 She was previously smoking 2 packs per day. She quit smoking for 6 months in 2020 after suffering MI. She lives with her sister who smokes. She grew up with significant second hand smoke exposure.   Past Medical History:  Diagnosis Date   Anxiety    Asthma    Bipolar 1 disorder ([place]    Bipolar depression ([place] 03/20/2013   Carpal tunnel syndrome 02/04/2013   Formatting of this note might be different from the original. 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 [facility]  Pt states that all problems are still present.  [facility]u [facility].  Wound  healing limited due to DM-type1 and smoking.   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 10/14/2018   Degenerative disc disease, lumbar 06/05/2015   Formatting of this note might be different from the original. L4-5 > L3-4, mild   Depression    patient reports bipolor   Depression    Diabetic neuropathy ([facility]    DKA (diabetic ketoacidoses) 03/19/2013   History of adenomatous polyp of colon 04/17/2015   Formatting of this note might be different from the original. Per path 2016   Hyperlipidemia LDL goal <70 10/04/2018   Hypokalemia 11/15/2014   Ingrown toenail of both feet 04/12/2019   Formatting of this note might be different from the original. great toenails bilateral   Insulin dependent diabetes mellitus with complications    insulin dependant   Jaw pain 02/24/2020   Leukocytosis 03/20/2013   Myocardial infarction ([facility] 10/02/2018   Neuropathy    Non-ST elevation (NSTEMI) myocardial infarction ([place] 10/02/2018   NSTEMI (non-ST elevated myocardial infarction) ([facility] 10/02/2018   Superficial incisional surgical site infection 11/14/2014   Syncopal episodes    Tobacco abuse 03/20/2013   Urinary retention 11/14/2014     Family History  Problem Relation Age of Onset   Heart attack Father 574  Diabetes Sister    Heart attack Brother 29   Heart disease Brother    Diabetes Maternal Grandmother  COPD Paternal Grandmother      Social History   Socioeconomic History   Marital status: Widowed    Spouse name: Not on file   Number of children: 2   Years of education: Not on file   Highest education level: Not on file  Occupational History   Occupation: Disabled  Tobacco Use   Smoking status: Every Day    Packs/day: 0.25    Types: Cigarettes   Smokeless tobacco: Never   Tobacco comments:    2-3 cigarettes per day  Vaping Use   Vaping Use: Never used  Substance and Sexual Activity   Alcohol use: Not Currently    Comment: occasional    Drug use: No   Sexual activity: Yes    Comment: tubal ligation  Other Topics Concern   Not on file  Social History Narrative   Not on file   Social Determinants of Health   Financial Resource Strain: Not on file  Food Insecurity: Not on file  Transportation Needs: Not on file  Physical Activity: Not on file  Stress: Not on file  Social Connections: Not on file  Intimate Partner Violence: Not on file     Allergies  Allergen Reactions   Amoxicillin Itching   Penicillins Hives, Itching and Swelling    Did it involve swelling of the face/tongue/throat, SOB, or low BP? No Did it involve sudden or severe rash/hives, skin peeling, or any reaction on the inside of your mouth or nose? No Did you need to seek medical attention at a hospital or doctor's office? No When did it last happen?      Childhood If all above answers are "NO", may proceed with cephalosporin use.   Prednisone Itching and Swelling     Outpatient Medications Prior to Visit  Medication Sig Dispense Refill   acetaminophen (TYLENOL) 325 MG tablet Take 650 mg by mouth every 6 (six) hours as needed for moderate pain or headache.      albuterol (PROVENTIL) (2.5 MG/3ML) 0.083% nebulizer solution Take 3 mLs (2.5 mg total) by nebulization every 6 (six) hours as needed for wheezing or shortness of breath. 75 mL 3   aspirin EC 81 MG EC tablet Take 1 tablet (81 mg total) by mouth daily.     atorvastatin (LIPITOR) 80 MG tablet Take 1 tablet (80 mg total) by mouth daily at 6 PM. 90 tablet 3   clopidogrel (PLAVIX) 75 MG tablet TAKE 1 TABLET BY MOUTH EVERY DAY (Patient taking differently: Take 75 mg by mouth daily.) 90 tablet 2   Continuous Blood Gluc Sensor (DEXCOM G6 SENSOR) MISC 1 Device by Does not apply route See admin instructions. Change every 10 days 9 each 3   fluticasone (FLONASE) 50 MCG/ACT nasal spray Place 2 sprays into both nostrils daily. 2 sprays each nostril at night 16 g 6   Fluticasone-Umeclidin-Vilant (TRELEGY  ELLIPTA) 100-62.5-25 MCG/ACT AEPB Inhale 1 puff into the lungs daily. 28 each 6   Glucagon, rDNA, (GLUCAGON EMERGENCY) 1 MG KIT Inject 1 mg as directed as needed. for low blood sugar episodes (Patient taking differently: Inject 1 mg as directed as needed (Low Blood Sugar). for low blood sugar episodes) 1 kit 11   insulin aspart (NOVOLOG) 100 UNIT/ML injection 3 times a day (just before each meal) 10-10-6 units 70 mL 3   loperamide (IMODIUM) 2 MG capsule Take 2 mg by mouth as needed for diarrhea or loose stools.     metoprolol tartrate (LOPRESSOR) 25 MG tablet Take  1 tablet (25 mg total) by mouth 2 (two) times daily. 180 tablet 3   montelukast (SINGULAIR) 10 MG tablet Take 10 mg by mouth at bedtime.     Multiple Vitamin (MULTIVITAMIN WITH MINERALS) TABS Take 1 tablet by mouth daily. Unknown strength     nitroGLYCERIN (NITROSTAT) 0.4 MG SL tablet PLACE 1 TABLET UNDER THE TONGUE EVERY 5 MINUTES X 3 DOSES AS NEEDED FOR CHEST PAIN. MAX3TAB/15MIN (Patient taking differently: Place 0.4 mg under the tongue every 5 (five) minutes as needed for chest pain. PLACE 1 TABLET UNDER THE TONGUE EVERY 5 MINUTES X 3 DOSES AS NEEDED FOR CHEST PAIN. MAX3TAB/15MIN) 25 tablet 3   ondansetron (ZOFRAN) 4 MG tablet Take 1 tablet (4 mg total) by mouth every 8 (eight) hours as needed for nausea or vomiting. 20 tablet 2   pantoprazole (PROTONIX) 40 MG tablet Take 1 tablet (40 mg total) by mouth daily. 90 tablet 3   ranolazine (RANEXA) 500 MG 12 hr tablet Take 1 tablet (500 mg total) by mouth 2 (two) times daily. 180 tablet 2   rizatriptan (MAXALT) 5 MG tablet Take 1 tablet (5 mg total) by mouth as needed for migraine. May repeat in 2 hours if needed 10 tablet 2   triamcinolone cream (KENALOG) 0.1 % Apply 1 application topically 2 (two) times daily. 30 g 0   varenicline (CHANTIX PAK) 0.5 MG X 11 & 1 MG X 42 tablet Take one 0.5 mg tablet by mouth once daily for 3 days, then increase to one 0.5 mg tablet twice daily for 4 days, then  increase to one 1 mg tablet twice daily. 53 tablet 0   VENTOLIN HFA 108 (90 Base) MCG/ACT inhaler Inhale 2 puffs into the lungs every 4 (four) hours as needed for wheezing or shortness of breath.     No facility-administered medications prior to visit.   Review of Systems  Constitutional:  Negative for chills, fever, malaise/fatigue and weight loss.  HENT:  Positive for congestion. Negative for sinus pain and sore throat.   Eyes: Negative.   Respiratory:  Positive for cough and shortness of breath. Negative for hemoptysis, sputum production and wheezing.   Cardiovascular:  Negative for chest pain, palpitations, orthopnea, claudication and leg swelling.  Gastrointestinal:  Negative for abdominal pain, heartburn, nausea and vomiting.  Genitourinary: Negative.   Musculoskeletal:  Negative for joint pain and myalgias.  Skin:  Negative for rash.  Neurological:  Negative for weakness.  Endo/Heme/Allergies:  Positive for environmental allergies.  Psychiatric/Behavioral: Negative.     Objective:   Vitals:   05/07/21 1458  BP: 114/72  Pulse: 85  SpO2: 96%  Weight: 135 lb (61.2 kg)  Height: '5\' 4"'  (1.626 m)    Physical Exam Constitutional:      General: She is not in acute distress.    Appearance: She is not ill-appearing.  HENT:     Head: Normocephalic and atraumatic.  Eyes:     General: No scleral icterus.    Conjunctiva/sclera: Conjunctivae normal.     Pupils: Pupils are equal, round, and reactive to light.  Cardiovascular:     Rate and Rhythm: Normal rate and regular rhythm.     Pulses: Normal pulses.     Heart sounds: Normal heart sounds. No murmur heard. Pulmonary:     Effort: Pulmonary effort is normal.     Breath sounds: Normal breath sounds. Decreased air movement present. No wheezing, rhonchi or rales.  Abdominal:     General: Bowel sounds are  normal.     Palpations: Abdomen is soft.  Musculoskeletal:     Right lower leg: No edema.     Left lower leg: No edema.   Lymphadenopathy:     Cervical: No cervical adenopathy.  Skin:    General: Skin is warm and dry.  Neurological:     General: No focal deficit present.     Mental Status: She is alert.  Psychiatric:        Mood and Affect: Mood normal.        Behavior: Behavior normal.        Thought Content: Thought content normal.        Judgment: Judgment normal.   CBC    Component Value Date/Time   WBC 7.7 01/05/2021 1655   RBC 4.48 01/05/2021 1655   HGB 13.1 01/05/2021 1655   HCT 39.7 01/05/2021 1655   PLT 227 01/05/2021 1655   MCV 88.6 01/05/2021 1655   MCH 29.2 01/05/2021 1655   MCHC 33.0 01/05/2021 1655   RDW 12.6 01/05/2021 1655   LYMPHSABS 2.4 01/05/2021 1655   MONOABS 0.4 01/05/2021 1655   EOSABS 0.1 01/05/2021 1655   BASOSABS 0.1 01/05/2021 1655   BMP Latest Ref Rng & Units 01/05/2021 01/04/2021 07/12/2020  Glucose 70 - 99 mg/dL 236(H) 76 54(L)  BUN 6 - 20 mg/dL 5(L) 5(L) 10  Creatinine 0.44 - 1.00 mg/dL 0.64 0.44(L) 0.71  BUN/Creat Ratio 9 - 23 - 11 -  Sodium 135 - 145 mmol/L 137 136 139  Potassium 3.5 - 5.1 mmol/L 3.8 4.3 3.6  Chloride 98 - 111 mmol/L 104 99 102  CO2 22 - 32 mmol/L 26 14(L) 27  Calcium 8.9 - 10.3 mg/dL 8.9 9.7 9.9   Chest imaging: CTA Chest 01/08/21 Cardiovascular: Satisfactory opacification of the pulmonary arteries to the segmental level. No evidence of pulmonary embolism. Thoracic aorta is normal in course and caliber. Normal heart size. No pericardial effusion.   Mediastinum/Nodes: No enlarged mediastinal, hilar, or axillary lymph nodes. Thyroid gland, trachea, and esophagus demonstrate no significant findings.   Lungs/Pleura: No focal airspace consolidation. No pleural effusion or pneumothorax.  CT Chest 2021 Mild apical predominant emphysema.  PFT: PFT Results Latest Ref Rng & Units 05/07/2021  DLCO uncorrected ml/min/mmHg 15.34  DLCO UNC% % 69  DLCO corrected ml/min/mmHg 15.34  DLCO COR %Predicted % 69  DLVA Predicted % 75  TLC L  8.09  TLC % Predicted % 160  RV % Predicted % 305    Labs:  Path:  Echo 10/17/20: LV EF 60-65%. Grade I diastolic dysfunction. RV systolic function is normal.   Heart Catheterization:  Assessment & Plan:   No diagnosis found.  Discussion: Melody Myers is a 41 year old woman, daily smoker with history of asthma, MI s/p stent placement in 2020 and covid infections x 2 who is referred to pulmonary clinic with cough and dyspnea.   She has moderate persistent asthma with evidence of apical emphysema on CT chest scans.   We will trial her on trelegy ellipta inhaler 1 puff daily and as needed albuterol. She is to stop advair and breo ellipta inhalers.   We discussed smoking cessation options today. She is interested in medical therapy. We will start her on chantix starter pak along with nicotine replacement via patches and lozenges.   We will schedule her for PFTs and follow up in 2 months.   Freda Jackson, MD [facility] Office: [phone]    Current Outpatient  Medications:    acetaminophen (TYLENOL) 325 MG tablet, Take 650 mg by mouth every 6 (six) hours as needed for moderate pain or headache. , Disp: , Rfl:    albuterol (PROVENTIL) (2.5 MG/3ML) 0.083% nebulizer solution, Take 3 mLs (2.5 mg total) by nebulization every 6 (six) hours as needed for wheezing or shortness of breath., Disp: 75 mL, Rfl: 3   aspirin EC 81 MG EC tablet, Take 1 tablet (81 mg total) by mouth daily., Disp:  , Rfl:    atorvastatin (LIPITOR) 80 MG tablet, Take 1 tablet (80 mg total) by mouth daily at 6 PM., Disp: 90 tablet, Rfl: 3   clopidogrel (PLAVIX) 75 MG tablet, TAKE 1 TABLET BY MOUTH EVERY DAY (Patient taking differently: Take 75 mg by mouth daily.), Disp: 90 tablet, Rfl: 2   Continuous Blood Gluc Sensor (DEXCOM G6 SENSOR) MISC, 1 Device by Does not apply route See admin instructions. Change every 10 days, Disp: 9 each, Rfl: 3   fluticasone (FLONASE) 50 MCG/ACT nasal spray,  Place 2 sprays into both nostrils daily. 2 sprays each nostril at night, Disp: 16 g, Rfl: 6   Fluticasone-Umeclidin-Vilant (TRELEGY ELLIPTA) 100-62.5-25 MCG/ACT AEPB, Inhale 1 puff into the lungs daily., Disp: 28 each, Rfl: 6   Glucagon, rDNA, (GLUCAGON EMERGENCY) 1 MG KIT, Inject 1 mg as directed as needed. for low blood sugar episodes (Patient taking differently: Inject 1 mg as directed as needed (Low Blood Sugar). for low blood sugar episodes), Disp: 1 kit, Rfl: 11   insulin aspart (NOVOLOG) 100 UNIT/ML injection, 3 times a day (just before each meal) 10-10-6 units, Disp: 70 mL, Rfl: 3   loperamide (IMODIUM) 2 MG capsule, Take 2 mg by mouth as needed for diarrhea or loose stools., Disp: , Rfl:    metoprolol tartrate (LOPRESSOR) 25 MG tablet, Take 1 tablet (25 mg total) by mouth 2 (two) times daily., Disp: 180 tablet, Rfl: 3   montelukast (SINGULAIR) 10 MG tablet, Take 10 mg by mouth at bedtime., Disp: , Rfl:    Multiple Vitamin (MULTIVITAMIN WITH MINERALS) TABS, Take 1 tablet by mouth daily. Unknown strength, Disp: , Rfl:    nitroGLYCERIN (NITROSTAT) 0.4 MG SL tablet, PLACE 1 TABLET UNDER THE TONGUE EVERY 5 MINUTES X 3 DOSES AS NEEDED FOR CHEST PAIN. MAX3TAB/15MIN (Patient taking differently: Place 0.4 mg under the tongue every 5 (five) minutes as needed for chest pain. PLACE 1 TABLET UNDER THE TONGUE EVERY 5 MINUTES X 3 DOSES AS NEEDED FOR CHEST PAIN. MAX3TAB/15MIN), Disp: 25 tablet, Rfl: 3   ondansetron (ZOFRAN) 4 MG tablet, Take 1 tablet (4 mg total) by mouth every 8 (eight) hours as needed for nausea or vomiting., Disp: 20 tablet, Rfl: 2   pantoprazole (PROTONIX) 40 MG tablet, Take 1 tablet (40 mg total) by mouth daily., Disp: 90 tablet, Rfl: 3   ranolazine (RANEXA) 500 MG 12 hr tablet, Take 1 tablet (500 mg total) by mouth 2 (two) times daily., Disp: 180 tablet, Rfl: 2   rizatriptan (MAXALT) 5 MG tablet, Take 1 tablet (5 mg total) by mouth as needed for migraine. May repeat in 2 hours if needed,  Disp: 10 tablet, Rfl: 2   triamcinolone cream (KENALOG) 0.1 %, Apply 1 application topically 2 (two) times daily., Disp: 30 g, Rfl: 0   varenicline (CHANTIX PAK) 0.5 MG X 11 & 1 MG X 42 tablet, Take one 0.5 mg tablet by mouth once daily for 3 days, then increase to one 0.5 mg tablet twice daily for  4 days, then increase to one 1 mg tablet twice daily., Disp: 53 tablet, Rfl: 0   VENTOLIN HFA 108 (90 Base) MCG/ACT inhaler, Inhale 2 puffs into the lungs every 4 (four) hours as needed for wheezing or shortness of breath., Disp: , Rfl:

## 2021-05-07 NOTE — Patient Instructions (Addendum)
Start chantix starter pack for smoking cessation. ? ?Continue trelegy ellipta 1 puff daily for your asthma ?- rinse mouth out after each use ? ?Continue to use albuterol inhaler as needed ? ?Continue montelukast daily ? ?Continue flonase nasal spray daily ? ?Safe to use allegra or zyrtec over the counter allergy medicine for your allergies.  ? ?Follow up in 6 months ? ? ?

## 2021-05-07 NOTE — Patient Instructions (Signed)
Diffusion capacity performed today. ?

## 2021-05-08 ENCOUNTER — Encounter: Payer: Medicare Other | Attending: Endocrinology | Admitting: Dietician

## 2021-05-08 ENCOUNTER — Telehealth: Payer: Self-pay

## 2021-05-08 DIAGNOSIS — E1040 Type 1 diabetes mellitus with diabetic neuropathy, unspecified: Secondary | ICD-10-CM | POA: Insufficient documentation

## 2021-05-08 DIAGNOSIS — Z713 Dietary counseling and surveillance: Secondary | ICD-10-CM | POA: Insufficient documentation

## 2021-05-08 NOTE — Telephone Encounter (Signed)
RX for Dexcom G6 has now been faxed over to [facility]. ?

## 2021-05-10 ENCOUNTER — Telehealth: Payer: Self-pay | Admitting: Dietician

## 2021-05-10 ENCOUNTER — Telehealth (HOSPITAL_COMMUNITY): Payer: Self-pay

## 2021-05-10 DIAGNOSIS — E1040 Type 1 diabetes mellitus with diabetic neuropathy, unspecified: Secondary | ICD-10-CM

## 2021-05-10 NOTE — Telephone Encounter (Signed)
Patient called stating that she has spoked to [facility] and the paperwork for the Dexcom sensors needs corrected.   ?She also is out of strips for her blood glucose meter.   ?Will forward to CMA. ? ?Discussed that she has an upcoming nutrition appointment. ? ?Patient to call for further questions. ? ?Oran Rein, RD, LDN, CDCES ? ?

## 2021-05-10 NOTE — Telephone Encounter (Signed)
Spoke with the patient, detailed instructions given. She stated that she would be here for here test. Asked to call back with any questions. S.Shivansh Hardaway EMTP 

## 2021-05-11 ENCOUNTER — Encounter: Payer: Self-pay | Admitting: Cardiology

## 2021-05-11 MED ORDER — ACCU-CHEK GUIDE VI STRP: ORAL_STRIP | 12 refills | Status: DC

## 2021-05-11 MED ORDER — ACCU-CHEK SOFTCLIX LANCETS MISC: 12 refills | Status: DC

## 2021-05-11 MED ORDER — ACCU-CHEK GUIDE W/DEVICE KIT: PACK | 0 refills | Status: DC

## 2021-05-11 NOTE — Telephone Encounter (Signed)
Form has now been filled out, signed by provider and faxed over. RX for accuchek guide me meter, lancets and strips now sent to patient's preferred pharmacy. ?

## 2021-05-11 NOTE — Telephone Encounter (Signed)
Paperwork from [facility] has been received, will give to provider to sign and then will fax back over. ?

## 2021-05-14 ENCOUNTER — Other Ambulatory Visit: Payer: Self-pay

## 2021-05-14 ENCOUNTER — Ambulatory Visit (INDEPENDENT_AMBULATORY_CARE_PROVIDER_SITE_OTHER): Payer: Medicare Other

## 2021-05-14 DIAGNOSIS — M79661 Pain in right lower leg: Secondary | ICD-10-CM | POA: Diagnosis not present

## 2021-05-14 DIAGNOSIS — M79662 Pain in left lower leg: Secondary | ICD-10-CM | POA: Diagnosis not present

## 2021-05-14 DIAGNOSIS — I25119 Atherosclerotic heart disease of native coronary artery with unspecified angina pectoris: Secondary | ICD-10-CM

## 2021-05-14 DIAGNOSIS — F17200 Nicotine dependence, unspecified, uncomplicated: Secondary | ICD-10-CM

## 2021-05-14 DIAGNOSIS — E785 Hyperlipidemia, unspecified: Secondary | ICD-10-CM

## 2021-05-14 DIAGNOSIS — E1040 Type 1 diabetes mellitus with diabetic neuropathy, unspecified: Secondary | ICD-10-CM

## 2021-05-15 ENCOUNTER — Ambulatory Visit (HOSPITAL_COMMUNITY): Payer: Medicare Other | Attending: Cardiology

## 2021-05-15 ENCOUNTER — Ambulatory Visit (HOSPITAL_COMMUNITY): Payer: Medicare Other

## 2021-05-15 DIAGNOSIS — E785 Hyperlipidemia, unspecified: Secondary | ICD-10-CM

## 2021-05-15 DIAGNOSIS — F17200 Nicotine dependence, unspecified, uncomplicated: Secondary | ICD-10-CM

## 2021-05-15 DIAGNOSIS — I25119 Atherosclerotic heart disease of native coronary artery with unspecified angina pectoris: Secondary | ICD-10-CM | POA: Diagnosis present

## 2021-05-15 DIAGNOSIS — E1040 Type 1 diabetes mellitus with diabetic neuropathy, unspecified: Secondary | ICD-10-CM | POA: Diagnosis present

## 2021-05-15 MED ORDER — PERFLUTREN LIPID MICROSPHERE
6.0000 mL | INTRAVENOUS | Status: AC | PRN
  Administered 2021-05-15: 6 mL via INTRAVENOUS

## 2021-05-18 ENCOUNTER — Encounter: Payer: Self-pay | Admitting: Pulmonary Disease

## 2021-05-20 ENCOUNTER — Other Ambulatory Visit: Payer: Self-pay | Admitting: Cardiology

## 2021-06-19 ENCOUNTER — Telehealth: Payer: Self-pay | Admitting: Nutrition

## 2021-06-19 NOTE — Telephone Encounter (Signed)
Message left that they scheduled her pump start with the dietitian and needed to be scheduled with me.  I can see her on 5/3, but need to change the time.  She was asked to call me back.  Phone number given. ?

## 2021-06-26 ENCOUNTER — Ambulatory Visit: Payer: Medicare Other | Admitting: Family Medicine

## 2021-06-27 ENCOUNTER — Telehealth: Payer: Self-pay

## 2021-06-27 ENCOUNTER — Other Ambulatory Visit: Payer: Self-pay

## 2021-06-27 ENCOUNTER — Encounter: Payer: Medicare Other | Admitting: Dietician

## 2021-06-27 DIAGNOSIS — E1040 Type 1 diabetes mellitus with diabetic neuropathy, unspecified: Secondary | ICD-10-CM

## 2021-06-27 MED ORDER — DEXCOM G6 SENSOR MISC: 1.0000 | 3 refills | Status: DC

## 2021-06-27 NOTE — Telephone Encounter (Signed)
Patient came into the office due to not receiving her Dexcom G6 sensors from th DME supplier. Informed the patient that we did fax over the prescription. Called and spoke with [facility] while the patient was in office to check on the status. [facility] rep did confirm that her application status is active and that the patient needs to get in contact with patient support. Provided the patient with a meter as well as test strips so that she will have a way to check her sugars until she is able to receive the order. Also provided the patient with [facility] number and patient support number.  ?

## 2021-07-03 ENCOUNTER — Ambulatory Visit: Payer: Medicare Other | Admitting: Endocrinology

## 2021-07-03 ENCOUNTER — Ambulatory Visit: Payer: Medicare Other | Admitting: Family Medicine

## 2021-07-04 ENCOUNTER — Ambulatory Visit: Payer: Medicare Other | Admitting: Family Medicine

## 2021-07-04 ENCOUNTER — Telehealth: Payer: Self-pay | Admitting: Family Medicine

## 2021-07-04 NOTE — Telephone Encounter (Signed)
Patient/Caregiver was notified of No Show/Late Cancellation Policy & possible $50 charge. ?Visit was cancelled with reason "No Show/Cancel within 24 hours" for tracking & charging. ? ?Caller Name: Floetta Brickey ?Caller Ph #: [phone] ?Date of APPT: 07/04/21 ?Reason given for no show/late cancellation: car problems ?No Show Letter printed & put in outgoing mail (Yes/No): yes ? ?~~~Route message to admin supervisor and clinical team/CMA~~~ ? ?  ?

## 2021-07-10 ENCOUNTER — Other Ambulatory Visit: Payer: Self-pay | Admitting: Cardiology

## 2021-07-10 DIAGNOSIS — I2510 Atherosclerotic heart disease of native coronary artery without angina pectoris: Secondary | ICD-10-CM

## 2021-07-23 ENCOUNTER — Telehealth: Payer: Self-pay | Admitting: Physician Assistant

## 2021-07-23 NOTE — Telephone Encounter (Signed)
Pt has been having chest pain today, nitro was no help and she got light-headed after the med.   Sx remind her of pre-PCI sx.  Feels very tired, is worried, wonders if she should go to the ER.  Advised her to call 911 and go in.   Theodore Demark, PA-C 07/23/2021 6:41 PM

## 2021-07-24 NOTE — Telephone Encounter (Signed)
Pt has 2 prior no shows, did not count this as a no show due to car trouble.

## 2021-08-06 ENCOUNTER — Other Ambulatory Visit: Payer: Self-pay | Admitting: Family Medicine

## 2021-08-06 ENCOUNTER — Ambulatory Visit (INDEPENDENT_AMBULATORY_CARE_PROVIDER_SITE_OTHER): Payer: Medicare Other | Admitting: Family Medicine

## 2021-08-06 VITALS — BP 106/60 | Temp 97.6°F | Ht 64.0 in | Wt 142.0 lb

## 2021-08-06 DIAGNOSIS — F17200 Nicotine dependence, unspecified, uncomplicated: Secondary | ICD-10-CM | POA: Diagnosis not present

## 2021-08-06 DIAGNOSIS — E1040 Type 1 diabetes mellitus with diabetic neuropathy, unspecified: Secondary | ICD-10-CM

## 2021-08-06 DIAGNOSIS — I25119 Atherosclerotic heart disease of native coronary artery with unspecified angina pectoris: Secondary | ICD-10-CM

## 2021-08-06 DIAGNOSIS — J4540 Moderate persistent asthma, uncomplicated: Secondary | ICD-10-CM | POA: Diagnosis not present

## 2021-08-06 DIAGNOSIS — K219 Gastro-esophageal reflux disease without esophagitis: Secondary | ICD-10-CM

## 2021-08-06 DIAGNOSIS — F319 Bipolar disorder, unspecified: Secondary | ICD-10-CM

## 2021-08-06 LAB — BASIC METABOLIC PANEL
BUN: 7 mg/dL (ref 6–23)
CO2: 29 mEq/L (ref 19–32)
Calcium: 9.2 mg/dL (ref 8.4–10.5)
Chloride: 102 mEq/L (ref 96–112)
Creatinine, Ser: 0.7 mg/dL (ref 0.40–1.20)
GFR: 109.1 mL/min (ref >60.00)
Glucose, Bld: 240 mg/dL — ABNORMAL HIGH (ref 70–99)
Potassium: 4 mEq/L (ref 3.5–5.1)
Sodium: 138 mEq/L (ref 135–145)

## 2021-08-06 LAB — URINALYSIS, ROUTINE W REFLEX MICROSCOPIC
Bilirubin Urine: NEGATIVE
Hgb urine dipstick: NEGATIVE
Ketones, ur: NEGATIVE
Nitrite: NEGATIVE
Specific Gravity, Urine: <=1.005 — AB (ref 1.000–1.030)
Total Protein, Urine: NEGATIVE
Urine Glucose: 500 — AB
Urobilinogen, UA: 0.2 (ref 0.0–1.0)
pH: 6 (ref 5.0–8.0)

## 2021-08-06 LAB — LIPID PANEL
Cholesterol: 106 mg/dL (ref 0–200)
HDL: 39.4 mg/dL (ref >39.00)
LDL Cholesterol: 46 mg/dL (ref 0–99)
NonHDL: 66.16
Total CHOL/HDL Ratio: 3
Triglycerides: 101 mg/dL (ref 0.0–149.0)
VLDL: 20.2 mg/dL (ref 0.0–40.0)

## 2021-08-06 LAB — MICROALBUMIN / CREATININE URINE RATIO
Creatinine,U: 30.6 mg/dL
Microalb Creat Ratio: 2.3 mg/g (ref 0.0–30.0)
Microalb, Ur: <0.7 mg/dL (ref 0.0–1.9)

## 2021-08-06 LAB — HEMOGLOBIN A1C: Hgb A1c MFr Bld: 7.6 % — ABNORMAL HIGH (ref 4.6–6.5)

## 2021-08-06 MED ORDER — ONDANSETRON HCL 4 MG PO TABS: 4.0000 mg | ORAL_TABLET | Freq: Three times a day (TID) | ORAL | 2 refills | Status: DC | PRN

## 2021-08-06 MED ORDER — ALBUTEROL SULFATE (2.5 MG/3ML) 0.083% IN NEBU: 2.5000 mg | INHALATION_SOLUTION | Freq: Four times a day (QID) | RESPIRATORY_TRACT | 3 refills | Status: DC | PRN

## 2021-08-06 MED ORDER — LITHIUM CARBONATE ER 300 MG PO TBCR: 300.0000 mg | EXTENDED_RELEASE_TABLET | Freq: Two times a day (BID) | ORAL | 1 refills | Status: DC

## 2021-08-06 NOTE — Progress Notes (Signed)
[facility] [address] [place] [address] Dept: [phone] Dept Fax: [phone]  Chronic Care Office Visit  Subjective:    Patient ID: Melody Myers, female    DOB: [birth year], 41 y.o..   MRN: [id number]  Chief Complaint  Patient presents with   Follow-up    3 month f/u.  No concerns.  Not fasting today.      History of Present Illness:  Patient is in today for reassessment of chronic medical issues.  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 She has not been well controlled. She has been seeing Dr. Loanne Drilling (endocrinology), who is stopping practice. She is currently on a tandem insulin pump, integrated with her CGM. She notes she has had quite a bit of difficulty with getting her  CGM sensors and other equipment for her pump. She notes that much of the last 2 months, she was not able to use the pump because of these issues.   Melody Myers has a history of a prior MI and mild diastolic dysfunction. She is managed on aspirin, Plavix, ranolzaine, and metoprolol. She has NTG for PRN use. She does continue to have episodes of chest pain and dyspnea on exertion. She smokes ~5 cigarettes a day. She was prescribed Chantix, but felt she could not afford the $90 co-pay. She sees Dr. Agustin Cree (cardiology) next week.   Melody Myers has a history of moderate, persistent asthma. She is managed by Dr. Erin Fulling. He has her using Trelegy (ICS/LAMA/LABA), Singulair and albuterol (SABA). She feels the Trelegy has helped.   Melody Myers has hyperlipidemia managed with atorvastatin.   Melody Myers has had multiple GI issues. She has had a mix of GERD, peptic ulcers, and some possible diabetic gastroparesis. She continues to use pantoprazole and occasional Zofran.   Melody Myers has a history of bipolar disorder going back many years. She is not currently on medication and has been unable to get established with a  psychiatrist. She notes she sleeps for 12-16 hours a day on many days. However, about 2-03 times a month, she has several days of mania, where she has excessive energy and can't sleep at all. She notes this has quite a negative impact on her. She had previously been managed on lithium for many years. She did find that this helped her feel more "normal". She notes she did have weight gain on this regimen. Once on lithium, she later had bupropion added.  Past Medical History: Patient Active Problem List   Diagnosis Date Noted   History of non-ST elevation myocardial infarction (NSTEMI) 04/30/2021   Migraine without aura and without status migrainosus, not intractable 80/32/1224   Mild diastolic dysfunction 82/50/0370   Antral ulcer 10/27/2020   Allergic rhinitis 07/12/2020   Gastroesophageal reflux disease without esophagitis 07/12/2020   Jaw pain 02/24/2020   Bipolar 1 disorder (HCC)    Syncopal episodes    Coronary artery disease- Severe single-vessel CAD with thrombotic occlusion of proximal OM1 10/14/2018   Hyperlipidemia LDL goal <70 10/04/2018   History of adenomatous polyp of colon 04/17/2015   Urinary retention 11/14/2014   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   Chronic pain 03/20/2013   Tobacco use disorder 03/20/2013   Diabetic neuropathy ([place])    Type 1 diabetes mellitus with diabetic neuropathy, unspecified ([place])    DKA (diabetic ketoacidoses) 03/19/2013   Moderate persistent asthma    Carpal tunnel syndrome 02/04/2013   Past Surgical History:  Procedure  Laterality Date   CORONARY STENT INTERVENTION N/A 10/02/2018   Procedure: CORONARY STENT INTERVENTION;  Surgeon: Nelva Bush, MD;  Location: [facility];  Service: Cardiovascular;  Laterality: N/A;   LEFT HEART CATH AND CORONARY ANGIOGRAPHY N/A 10/02/2018   Procedure: LEFT HEART CATH AND CORONARY ANGIOGRAPHY;  Surgeon: Nelva Bush, MD;  Location: [facility];  Service: Cardiovascular;  Laterality: N/A;   TUBAL  LIGATION     VAGINA RECONSTRUCTION SURGERY  10/2014   & ana, reconstruction   Family History  Problem Relation Age of Onset   Heart attack Father 47   Diabetes Sister    Heart attack Brother 29   Heart disease Brother    Diabetes Maternal Grandmother    COPD Paternal Grandmother    Outpatient Medications Prior to Visit  Medication Sig Dispense Refill   Accu-Chek Softclix Lancets lancets Use as instructed 100 each 12   acetaminophen (TYLENOL) 325 MG tablet Take 650 mg by mouth every 6 (six) hours as needed for moderate pain or headache.      aspirin EC 81 MG EC tablet Take 1 tablet (81 mg total) by mouth daily.     atorvastatin (LIPITOR) 80 MG tablet TAKE 1 TABLET BY MOUTH DAILY AT 6 PM. 90 tablet 3   Blood Glucose Monitoring Suppl (ACCU-CHEK GUIDE) w/Device KIT Use as instructed to check blood sugar 1 kit 0   clopidogrel (PLAVIX) 75 MG tablet TAKE 1 TABLET BY MOUTH EVERY DAY 90 tablet 2   Continuous Blood Gluc Sensor (DEXCOM G6 SENSOR) MISC 1 Device by Does not apply route See admin instructions. Change every 10 days 9 each 3   fluticasone (FLONASE) 50 MCG/ACT nasal spray Place 2 sprays into both nostrils daily. 2 sprays each nostril at night 16 g 6   Fluticasone-Umeclidin-Vilant (TRELEGY ELLIPTA) 100-62.5-25 MCG/ACT AEPB Inhale 1 puff into the lungs daily. 28 each 6   Glucagon, rDNA, (GLUCAGON EMERGENCY) 1 MG KIT Inject 1 mg as directed as needed. for low blood sugar episodes (Patient taking differently: Inject 1 mg as directed as needed (Low Blood Sugar). for low blood sugar episodes) 1 kit 11   glucose blood (ACCU-CHEK GUIDE) test strip Use as instructed to check blood sugar 4 times a day 100 each 12   insulin aspart (NOVOLOG) 100 UNIT/ML injection 3 times a day (just before each meal) 10-10-6 units 70 mL 3   loperamide (IMODIUM) 2 MG capsule Take 2 mg by mouth as needed for diarrhea or loose stools.     metoprolol tartrate (LOPRESSOR) 25 MG tablet TAKE 1 TABLET BY MOUTH TWICE A DAY  180 tablet 3   montelukast (SINGULAIR) 10 MG tablet Take 1 tablet (10 mg total) by mouth at bedtime. 30 tablet 11   Multiple Vitamin (MULTIVITAMIN WITH MINERALS) TABS Take 1 tablet by mouth daily. Unknown strength     nitroGLYCERIN (NITROSTAT) 0.4 MG SL tablet PLACE 1 TABLET UNDER THE TONGUE EVERY 5 MINUTES X 3 DOSES AS NEEDED FOR CHEST PAIN. MAX3TAB/15MIN 25 tablet 3   pantoprazole (PROTONIX) 40 MG tablet Take 1 tablet (40 mg total) by mouth daily. 90 tablet 3   rizatriptan (MAXALT) 5 MG tablet Take 1 tablet (5 mg total) by mouth as needed for migraine. May repeat in 2 hours if needed 10 tablet 2   triamcinolone cream (KENALOG) 0.1 % Apply 1 application topically 2 (two) times daily. 30 g 0   VENTOLIN HFA 108 (90 Base) MCG/ACT inhaler Inhale 2 puffs into the lungs  every 4 (four) hours as needed for wheezing or shortness of breath.     albuterol (PROVENTIL) (2.5 MG/3ML) 0.083% nebulizer solution Take 3 mLs (2.5 mg total) by nebulization every 6 (six) hours as needed for wheezing or shortness of breath. 75 mL 3   ondansetron (ZOFRAN) 4 MG tablet Take 1 tablet (4 mg total) by mouth every 8 (eight) hours as needed for nausea or vomiting. 20 tablet 2   ranolazine (RANEXA) 500 MG 12 hr tablet Take 1 tablet (500 mg total) by mouth 2 (two) times daily. (Patient not taking: Reported on 08/06/2021) 180 tablet 2   varenicline (CHANTIX PAK) 0.5 MG X 11 & 1 MG X 42 tablet Take one 0.5 mg tablet by mouth once daily for 3 days, then increase to one 0.5 mg tablet twice daily for 4 days, then increase to one 1 mg tablet twice daily. (Patient not taking: Reported on 08/06/2021) 53 tablet 0   No facility-administered medications prior to visit.   Allergies  Allergen Reactions   Amoxicillin Itching   Penicillins Hives, Itching and Swelling    Did it involve swelling of the face/tongue/throat, SOB, or low BP? No Did it involve sudden or severe rash/hives, skin peeling, or any reaction on the inside of your mouth or  nose? No Did you need to seek medical attention at a hospital or doctor's office? No When did it last happen?      Childhood If all above answers are "NO", may proceed with cephalosporin use.   Prednisone Itching and Swelling     Objective:   Today's Vitals   08/06/21 0814  BP: 106/60  Temp: 97.6 F (36.4 C)  TempSrc: Temporal  SpO2: 97%  Weight: 142 lb (64.4 kg)  Height: _0  (1.626 m)   Body mass index is 24.37 kg/m.   General: Well developed, well nourished. No acute distress. Lungs: Clear to auscultation bilaterally. No wheezing, rales or rhonchi. Psych: Alert and oriented. Normal mood and affect.  Health Maintenance Due  Topic Date Due   OPHTHALMOLOGY EXAM  04/25/2021   URINE MICROALBUMIN  07/12/2021     Assessment & Plan:   1. Type 1 diabetes mellitus with diabetic neuropathy, unspecified ([place]) Ms. Beynon is due for annual DM labs. I do not see any pending appointments with endocrinology, so will try and get her connected with a new specialist in light of Dr. Cordelia Pen departure.  - Lipid panel - Microalbumin / creatinine urine ratio - Basic metabolic panel - Hemoglobin A1c - Urinalysis, Routine w reflex microscopic - Ambulatory referral to Endocrinology  2. Coronary artery disease involving native coronary artery of native heart with angina pectoris ([place]) Continued angina. Ongoing cigarette use remains an issue putting her at high risk for further coronary events. I will check her lipids today.  - Lipid panel  3. Moderate persistent asthma, unspecified whether complicated Improved with use of Trelegy. I will renew her albuterol solution.  - albuterol (PROVENTIL) (2.5 MG/3ML) 0.083% nebulizer solution; Take 3 mLs (2.5 mg total) by nebulization every 6 (six) hours as needed for wheezing or shortness of breath.  Dispense: 75 mL; Refill: 3  4. Tobacco use disorder Continue to advise smoking cessation. I did point out the cost of smoking compared to the  Chantix co-pay (both direct cost of cigarettes, vaping solutions, and drug co-pays for asthma meds).  5. Bipolar 1 disorder ([place]) Ms. Shuping has poorly managed bipolar disorder. This was previously classified as bipolar 1. I think the depression  component is a significant issue, but want to get her back on a mood stabilizer initially. As she previously had a good response to lithium, I will restart this. I will plan to see her within a month and check a lithium level before titrating up on her medication.  - lithium carbonate (LITHOBID) 300 MG CR tablet; Take 1 tablet (300 mg total) by mouth 2 (two) times daily.  Dispense: 60 tablet; Refill: 1  6. Gastroesophageal reflux disease without esophagitis Mixed issue with GERD and likely gastroparesis. Continue PPI and ondansetron.  - ondansetron (ZOFRAN) 4 MG tablet; Take 1 tablet (4 mg total) by mouth every 8 (eight) hours as needed for nausea or vomiting.  Dispense: 20 tablet; Refill: 2   Return in about 4 weeks (around 09/03/2021) for Reassessment, labs.   Haydee Salter, MD

## 2021-08-15 ENCOUNTER — Encounter: Payer: Self-pay | Admitting: Cardiology

## 2021-08-15 ENCOUNTER — Ambulatory Visit (INDEPENDENT_AMBULATORY_CARE_PROVIDER_SITE_OTHER): Payer: Medicare Other | Admitting: Cardiology

## 2021-08-15 VITALS — BP 96/62 | HR 76 | Ht 64.0 in | Wt 138.6 lb

## 2021-08-15 DIAGNOSIS — I25119 Atherosclerotic heart disease of native coronary artery with unspecified angina pectoris: Secondary | ICD-10-CM

## 2021-08-15 DIAGNOSIS — E1042 Type 1 diabetes mellitus with diabetic polyneuropathy: Secondary | ICD-10-CM

## 2021-08-15 DIAGNOSIS — E785 Hyperlipidemia, unspecified: Secondary | ICD-10-CM | POA: Diagnosis not present

## 2021-08-15 DIAGNOSIS — J4540 Moderate persistent asthma, uncomplicated: Secondary | ICD-10-CM | POA: Diagnosis not present

## 2021-08-15 DIAGNOSIS — F17200 Nicotine dependence, unspecified, uncomplicated: Secondary | ICD-10-CM

## 2021-08-15 MED ORDER — AMLODIPINE BESYLATE 2.5 MG PO TABS: 2.5000 mg | ORAL_TABLET | Freq: Every day | ORAL | 2 refills | Status: DC

## 2021-08-15 NOTE — Patient Instructions (Signed)
Medication Instructions:  Your physician has recommended you make the following change in your medication:  Start Amlodipine 2.5 mg once daily for BP  *If you need a refill on your cardiac medications before your next appointment, please call your pharmacy*   Lab Work: NONE If you have labs (blood work) drawn today and your tests are completely normal, you will receive your results only by: MyChart Message (if you have MyChart) OR A paper copy in the mail If you have any lab test that is abnormal or we need to change your treatment, we will call you to review the results.   Testing/Procedures: NONE   Follow-Up: At [facility], you and your health needs are our priority.  As part of our continuing mission to provide you with exceptional heart care, we have created designated Provider Care Teams.  These Care Teams include your primary Cardiologist (physician) and Advanced Practice Providers (APPs -  Physician Assistants and Nurse Practitioners) who all work together to provide you with the care you need, when you need it.  We recommend signing up for the patient portal called "MyChart".  Sign up information is provided on this After Visit Summary.  MyChart is used to connect with patients for Virtual Visits (Telemedicine).  Patients are able to view lab/test results, encounter notes, upcoming appointments, etc.  Non-urgent messages can be sent to your provider as well.   To learn more about what you can do with MyChart, go to [email or web address].    Your next appointment:   3 month(s)  The format for your next appointment:   In Person  Provider:   Gypsy Balsam, MD    Other Instructions   Important Information About Sugar

## 2021-08-15 NOTE — Addendum Note (Signed)
Addended by: Roosvelt Harps R on: 08/15/2021 02:05 PM   Modules accepted: Orders

## 2021-08-15 NOTE — Progress Notes (Signed)
Cardiology Office Note:    Date:  08/15/2021   ID:  Melody Myers, DOB 26-Sep-1980, MRN [id number]  PCP:  Haydee Salter, MD  Cardiologist:  Jenne Campus, MD    Referring MD: Haydee Salter, MD   Chief Complaint  Patient presents with   Medication Management    Stopped ranolazine -nausea    History of Present Illness:    Melody Myers is a 41 y.o. female with past medical history significant for coronary artery disease.  In August 2020 she required PTCA and stent of the obtuse marginal branch.  At the same time she was find to have moderate disease elsewhere.  She also got poorly controlled diabetes, she is an active smoker dyslipidemia.  I being seen her repeatedly for chest pain.  She did have multiple stress test last stress test done in March stress echocardiogram she walked quite well on the treadmill with negative stress test in spite of that she still complain of having chest pain I tried long-acting nitrates we gave her headaches I try ranolazine which gave her some stomach upset.  I will try to put her on calcium channel blocker and see if that helps but if it does not we may be forced to put her back to the cardiac Cath Lab.  Past Medical History:  Diagnosis Date   Anxiety    Asthma    Bipolar 1 disorder ([place])    Bipolar depression ([place]) 03/20/2013   Carpal tunnel syndrome 02/04/2013   Formatting of this note might be different from the original. IMPRESSION: Order right CTI 28786- ASAP   Chest pain 12/24/2018   Chronic pain 03/20/2013   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   Formatting of this note might be different from the original. Repaired at [facility] in Sept 2016.  Pt states that all problems are still present.  If followed by [facility] for this. She has f/u appt.  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 10/14/2018   Degenerative disc disease, lumbar 06/05/2015   Formatting of this  note might be different from the original. L4-5 > L3-4, mild   Depression    patient reports bipolor   Depression    Diabetic neuropathy ([place])    DKA (diabetic ketoacidoses) 03/19/2013   History of adenomatous polyp of colon 04/17/2015   Formatting of this note might be different from the original. Per path 2016   Hyperlipidemia LDL goal <70 10/04/2018   Hypokalemia 11/15/2014   Ingrown toenail of both feet 04/12/2019   Formatting of this note might be different from the original. great toenails bilateral   Insulin dependent diabetes mellitus with complications    insulin dependant   Jaw pain 02/24/2020   Leukocytosis 03/20/2013   Myocardial infarction ([place]) 10/02/2018   Neuropathy    Non-ST elevation (NSTEMI) myocardial infarction ([place]) 10/02/2018   NSTEMI (non-ST elevated myocardial infarction) ([place]) 10/02/2018   Superficial incisional surgical site infection 11/14/2014   Syncopal episodes    Tobacco abuse 03/20/2013   Urinary retention 11/14/2014    Past Surgical History:  Procedure Laterality Date   CORONARY STENT INTERVENTION N/A 10/02/2018   Procedure: CORONARY STENT INTERVENTION;  Surgeon: Nelva Bush, MD;  Location: [facility];  Service: Cardiovascular;  Laterality: N/A;   LEFT HEART CATH AND CORONARY ANGIOGRAPHY N/A 10/02/2018   Procedure: LEFT HEART CATH AND CORONARY ANGIOGRAPHY;  Surgeon: Nelva Bush, MD;  Location: [facility];  Service: Cardiovascular;  Laterality: N/A;   TUBAL LIGATION     VAGINA RECONSTRUCTION SURGERY  10/2014   & ana, reconstruction    Current Medications: Current Meds  Medication Sig   Accu-Chek Softclix Lancets lancets Use as instructed (Patient taking differently: 1 each by Other route daily. Use as instructed Glucose check)   acetaminophen (TYLENOL) 325 MG tablet Take 650 mg by mouth every 6 (six) hours as needed for moderate pain or headache.    aspirin EC 81 MG EC tablet Take 1 tablet (81 mg total) by mouth daily.   atorvastatin  (LIPITOR) 80 MG tablet TAKE 1 TABLET BY MOUTH DAILY AT 6 PM. (Patient taking differently: Take 80 mg by mouth daily.)   Blood Glucose Monitoring Suppl (ACCU-CHEK GUIDE) w/Device KIT Use as instructed to check blood sugar (Patient taking differently: 1 each by Other route See admin instructions. Use as instructed to check blood sugar)   clopidogrel (PLAVIX) 75 MG tablet TAKE 1 TABLET BY MOUTH EVERY DAY (Patient taking differently: Take 75 mg by mouth daily.)   Continuous Blood Gluc Sensor (DEXCOM G6 SENSOR) MISC 1 Device by Does not apply route See admin instructions. Change every 10 days   fluticasone (FLONASE) 50 MCG/ACT nasal spray Place 2 sprays into both nostrils daily. 2 sprays each nostril at night   Fluticasone-Umeclidin-Vilant (TRELEGY ELLIPTA) 100-62.5-25 MCG/ACT AEPB Inhale 1 puff into the lungs daily.   Glucagon, rDNA, (GLUCAGON EMERGENCY) 1 MG KIT Inject 1 mg as directed as needed. for low blood sugar episodes (Patient taking differently: Inject 1 mg as directed as needed (Low Blood Sugar). for low blood sugar episodes)   glucose blood (ACCU-CHEK GUIDE) test strip Use as instructed to check blood sugar 4 times a day (Patient taking differently: 1 each by Other route as needed for other (see below). Use as instructed to check blood sugar 4 times a day)   insulin aspart (NOVOLOG) 100 UNIT/ML injection 3 times a day (just before each meal) 10-10-6 units (Patient taking differently: Inject 6-10 Units into the skin 3 (three) times daily with meals. 3 times a day (just before each meal) 10-10-6 units)   levalbuterol (XOPENEX) 1.25 MG/3ML nebulizer solution USE 1 VIAL IN NEBULIZER EVERY 6 HOURS (Patient taking differently: Take 1.25 mg by nebulization every 6 (six) hours as needed for wheezing or shortness of breath.)   lithium carbonate (LITHOBID) 300 MG CR tablet Take 1 tablet (300 mg total) by mouth 2 (two) times daily.   loperamide (IMODIUM) 2 MG capsule Take 2 mg by mouth as needed for diarrhea  or loose stools.   metoprolol tartrate (LOPRESSOR) 25 MG tablet TAKE 1 TABLET BY MOUTH TWICE A DAY (Patient taking differently: Take 25 mg by mouth 2 (two) times daily.)   montelukast (SINGULAIR) 10 MG tablet Take 1 tablet (10 mg total) by mouth at bedtime.   Multiple Vitamin (MULTIVITAMIN WITH MINERALS) TABS Take 1 tablet by mouth daily. Unknown strength   nitroGLYCERIN (NITROSTAT) 0.4 MG SL tablet PLACE 1 TABLET UNDER THE TONGUE EVERY 5 MINUTES X 3 DOSES AS NEEDED FOR CHEST PAIN. MAX3TAB/15MIN (Patient taking differently: Place 0.4 mg under the tongue every 5 (five) minutes as needed for chest pain. PLACE 1 TABLET UNDER THE TONGUE EVERY 5 MINUTES X 3 DOSES AS NEEDED FOR CHEST PAIN. MAX3TAB/15MIN)   ondansetron (ZOFRAN) 4 MG tablet Take 1 tablet (4 mg total) by mouth every 8 (eight) hours as needed for nausea or vomiting.   pantoprazole (PROTONIX) 40 MG tablet Take  1 tablet (40 mg total) by mouth daily.   rizatriptan (MAXALT) 5 MG tablet Take 1 tablet (5 mg total) by mouth as needed for migraine. May repeat in 2 hours if needed   triamcinolone cream (KENALOG) 0.1 % Apply 1 application topically 2 (two) times daily.   VENTOLIN HFA 108 (90 Base) MCG/ACT inhaler Inhale 2 puffs into the lungs every 4 (four) hours as needed for wheezing or shortness of breath.     Allergies:   Amoxicillin, Penicillins, and Prednisone   Social History   Socioeconomic History   Marital status: Widowed    Spouse name: Not on file   Number of children: 2   Years of education: Not on file   Highest education level: Not on file  Occupational History   Occupation: Disabled  Tobacco Use   Smoking status: Every Day    Packs/day: 0.25    Types: Cigarettes   Smokeless tobacco: Never   Tobacco comments:    2-3 cigarettes per day  Vaping Use   Vaping Use: Never used  Substance and Sexual Activity   Alcohol use: Not Currently    Comment: occasional   Drug use: No   Sexual activity: Yes    Comment: tubal ligation   Other Topics Concern   Not on file  Social History Narrative   Not on file   Social Determinants of Health   Financial Resource Strain: High Risk (10/02/2018)   Overall Financial Resource Strain (CARDIA)    Difficulty of Paying Living Expenses: Hard  Food Insecurity: Food Insecurity Present (10/02/2018)   Hunger Vital Sign    Worried About Running Out of Food in the Last Year: Often true    Ran Out of Food in the Last Year: Sometimes true  Transportation Needs: Unmet Transportation Needs (10/02/2018)   PRAPARE - Hydrologist (Medical): Yes    Lack of Transportation (Non-Medical): Yes  Physical Activity: Insufficiently Active (10/02/2018)   Exercise Vital Sign    Days of Exercise per Week: 1 day    Minutes of Exercise per Session: 30 min  Stress: Stress Concern Present (10/02/2018)   [employer] [place]    Feeling of Stress : Very much  Social Connections: Socially Isolated (10/02/2018)   Social Connection and Isolation Panel [NHANES]    Frequency of Communication with Friends and Family: More than three times a week    Frequency of Social Gatherings with Friends and Family: More than three times a week    Attends Religious Services: Never    Marine scientist or Organizations: No    Attends Archivist Meetings: Never    Marital Status: Widowed     Family History: The patient's family history includes COPD in her paternal grandmother; Diabetes in her maternal grandmother and sister; Heart attack (age of onset: 55) in her brother; Heart attack (age of onset: 36) in her father; Heart disease in her brother. ROS:   Please see the history of present illness.    All 14 point review of systems negative except as described per history of present illness  EKGs/Labs/Other Studies Reviewed:      Recent Labs: 01/05/2021: B Natriuretic Peptide 447.3; Hemoglobin 13.1; Platelets 227 08/06/2021:  BUN 7; Creatinine, Ser 0.70; Potassium 4.0; Sodium 138  Recent Lipid Panel    Component Value Date/Time   CHOL 106 08/06/2021 0844   CHOL 100 06/19/2020 0844   TRIG 101.0 08/06/2021 0844  HDL 39.40 08/06/2021 0844   HDL 40 06/19/2020 0844   CHOLHDL 3 08/06/2021 0844   VLDL 20.2 08/06/2021 0844   LDLCALC 46 08/06/2021 0844   LDLCALC 46 06/19/2020 0844    Physical Exam:    VS:  BP 96/62 (BP Location: Left Arm, Patient Position: Sitting)   Pulse 76   Ht 5' 4" (1.626 m)   Wt 138 lb 9.6 oz (62.9 kg)   SpO2 96%   BMI 23.79 kg/m     Wt Readings from Last 3 Encounters:  08/15/21 138 lb 9.6 oz (62.9 kg)  08/06/21 142 lb (64.4 kg)  05/07/21 135 lb (61.2 kg)     GEN:  Well nourished, well developed in no acute distress HEENT: Normal NECK: No JVD; No carotid bruits LYMPHATICS: No lymphadenopathy CARDIAC: RRR, no murmurs, no rubs, no gallops RESPIRATORY:  Clear to auscultation without rales, wheezing or rhonchi  ABDOMEN: Soft, non-tender, non-distended MUSCULOSKELETAL:  No edema; No deformity  SKIN: Warm and dry LOWER EXTREMITIES: no swelling NEUROLOGIC:  Alert and oriented x 3 PSYCHIATRIC:  Normal affect   ASSESSMENT:    1. Coronary artery disease involving native coronary artery of native heart with angina pectoris ([place])   2. Moderate persistent asthma, unspecified whether complicated   3. Diabetic polyneuropathy associated with type 1 diabetes mellitus ([place])   4. Hyperlipidemia LDL goal <70   5. Tobacco use disorder    PLAN:    In order of problems listed above:  Coronary artery disease still having some chest pain.  We will try amlodipine 2.5 mg daily she takes nitroglycerin on as-needed basis maybe once a week.  I asked her to let me know if that became more frequent in the meantime I elected to continue dual antiplatelets therapy because of multiple none modified risk factors which include active smoking as well as poorly controlled diabetes do I have to admit her  diabetes seems to be better controlled. Moderate persistent asthma which may be contributing to her symptomatology.  Then be managed by internal medicine team Diabetes hemoglobin A1c 7.6 from K PN this is from 08/06/2021 better than before but still some room for improvement Dyslipidemia: She is on high intense statin form of Lipitor 80 which I continue I did review K PN which show me data from 08/06/2021 with LDL of 46 HDL 39.  We will continue present management   Medication Adjustments/Labs and Tests Ordered: Current medicines are reviewed at length with the patient today.  Concerns regarding medicines are outlined above.  No orders of the defined types were placed in this encounter.  Medication changes: No orders of the defined types were placed in this encounter.   Signed, Park Liter, MD, [facility] 08/15/2021 1:56 PM    [place]

## 2021-08-21 ENCOUNTER — Encounter: Payer: Self-pay | Admitting: Cardiology

## 2021-08-22 NOTE — Telephone Encounter (Signed)
Called and spoke with pt who states that the chest pain was a burning sensation. Pt reports using 2 ntg but hours apart as she did not understand how to take. I have reviewed how and when to use NTG and to call 911. Pt reports that she felt that both times she felt the NTG did help. Pt states this am she is not having pain but reports it scared her and that Dr. Bing Matter told her to reach out if she had pain.   Last cath 10/02/18  Conclusions: Severe single-vessel CAD with thrombotic occlusion of proximal OM1. Moderate LAD, proximal LCx, and RCA disease. Mildly elevated LVEDP. Normal LVEF. Successful PCI to OM1 using Resolute Onyx 2.25 x 18 mm drug-eluting stent with 0% residual stenosis and TIMI-3 flow.   Recommendations: Dual antiplatelet therapy with aspirin and ticagrelor for at least 12 months. Aggressive secondary prevention.   Yvonne Kendall, MD [facility]  Stress echo 05/15/21  IMPRESSIONS     1. No evidence for ischemia by echo or ECG stress testing.   2. This is a negative stress echocardiogram for ischemia.   3. This is a low risk study.   FINDINGS   Exam Protocol: The patient exercised on a treadmill according to a Bruce  protocol. Definity contrast agent was given IV to delineate the left  ventricular endocardial borders.      Patient Performance: The patient exercised for 7 minutes and 30 seconds,  achieving 9 METS. The maximum stage achieved was III of the Bruce  protocol. The baseline heart rate was 88 bpm. The heart rate at peak  stress was 166 bpm. The target heart rate was  calculated to be 154 bpm. The percentage of maximum predicted heart rate  achieved was 91.7 %. The baseline blood pressure was 112/65 mmHg. The  blood pressure at peak stress was 159/66 mmHg. The patient developed  shortness of breath during the stress  exam. The symptoms resolved with rest.     EKG: Resting EKG showed normal sinus rhythm. The patient developed no  abnormal EKG  findings during exercise.      2D Echo Findings: The baseline ejection fraction was 60%. The peak  ejection fraction at stress was 75%. Baseline regional wall motion  abnormalities were not present. There were no stress-induced wall motion  abnormalities. This is a negative stress  echocardiogram for ischemia.     Additional Findings: No evidence for ischemia by echo or ECG stress  testing.

## 2021-08-23 ENCOUNTER — Emergency Department (HOSPITAL_COMMUNITY): Payer: Medicare Other

## 2021-08-23 ENCOUNTER — Encounter (HOSPITAL_COMMUNITY): Payer: Self-pay | Admitting: Emergency Medicine

## 2021-08-23 ENCOUNTER — Ambulatory Visit (HOSPITAL_COMMUNITY)
Admission: EM | Admit: 2021-08-23 | Discharge: 2021-08-23 | Disposition: A | Discharge status: Home / Self Care | Payer: Medicare Other

## 2021-08-23 ENCOUNTER — Emergency Department (HOSPITAL_COMMUNITY)
Admission: EM | Admit: 2021-08-23 | Discharge: 2021-08-23 | Discharge status: Against Medical Advice | Payer: Medicare Other | Attending: Emergency Medicine | Admitting: Emergency Medicine

## 2021-08-23 ENCOUNTER — Other Ambulatory Visit: Payer: Self-pay

## 2021-08-23 DIAGNOSIS — Z5321 Procedure and treatment not carried out due to patient leaving prior to being seen by health care provider: Secondary | ICD-10-CM | POA: Diagnosis not present

## 2021-08-23 DIAGNOSIS — R072 Precordial pain: Secondary | ICD-10-CM | POA: Diagnosis present

## 2021-08-23 DIAGNOSIS — R0602 Shortness of breath: Secondary | ICD-10-CM | POA: Insufficient documentation

## 2021-08-23 DIAGNOSIS — R531 Weakness: Secondary | ICD-10-CM | POA: Diagnosis not present

## 2021-08-23 DIAGNOSIS — R42 Dizziness and giddiness: Secondary | ICD-10-CM | POA: Diagnosis not present

## 2021-08-23 DIAGNOSIS — R110 Nausea: Secondary | ICD-10-CM | POA: Insufficient documentation

## 2021-08-23 LAB — BASIC METABOLIC PANEL
Anion gap: 10 (ref 5–15)
BUN: 7 mg/dL (ref 6–20)
CO2: 24 mmol/L (ref 22–32)
Calcium: 9.1 mg/dL (ref 8.9–10.3)
Chloride: 104 mmol/L (ref 98–111)
Creatinine, Ser: 0.64 mg/dL (ref 0.44–1.00)
GFR, Estimated: >60 mL/min (ref >60)
Glucose, Bld: 210 mg/dL — ABNORMAL HIGH (ref 70–99)
Potassium: 3.9 mmol/L (ref 3.5–5.1)
Sodium: 138 mmol/L (ref 135–145)

## 2021-08-23 LAB — CBC
HCT: 42.2 % (ref 36.0–46.0)
Hemoglobin: 14.5 g/dL (ref 12.0–15.0)
MCH: 30.3 pg (ref 26.0–34.0)
MCHC: 34.4 g/dL (ref 30.0–36.0)
MCV: 88.3 fL (ref 80.0–100.0)
Platelets: 237 10*3/uL (ref 150–400)
RBC: 4.78 MIL/uL (ref 3.87–5.11)
RDW: 12.7 % (ref 11.5–15.5)
WBC: 9.6 10*3/uL (ref 4.0–10.5)
nRBC: 0 % (ref 0.0–0.2)

## 2021-08-23 LAB — TROPONIN I (HIGH SENSITIVITY)
Troponin I (High Sensitivity): <2 ng/L (ref <18)
Troponin I (High Sensitivity): <2 ng/L (ref <18)

## 2021-08-23 LAB — I-STAT BETA HCG BLOOD, ED (MC, WL, AP ONLY): I-stat hCG, quantitative: <5 m[IU]/mL (ref <5)

## 2021-08-23 NOTE — ED Notes (Signed)
This patient informed this EMT that she was leaving without being seen. She informed this EMT that she would try to get in with "[facility]" tomorrow. This EMT informed the patient that if her symptoms worsen to come back to the emergency department or to call 911.

## 2021-08-23 NOTE — ED Provider Triage Note (Signed)
Emergency Medicine Provider Triage Evaluation Note  Melody Myers , a 41 y.o. female  was evaluated in triage.  Pt complains of substernal chest pain.  Patient states that pain began approximately 2 days ago.  States it is fluctuating from a 2 out of 10 to a 10 out of 10 in severity.  It has not completely gone away since onset.  Patient endorses radiation of symptoms into the right arm/shoulder/jaw.  Patient has taken 2 nitroglycerin at home which did provide relief.  Patient also endorses nausea and shortness of breath with onset of chest pain.  Patient denies abdominal pain.  Patient has history of previous MI requiring PCI approximately 3 years ago  Review of Systems  Positive: Chest pain, shortness of breath, nausea Negative: Abdominal pain  Physical Exam  BP 114/75 (BP Location: Right Arm)   Pulse 92   Temp 98.5 F (36.9 C) (Oral)   Resp 18   SpO2 99%  Gen:   Awake, no distress   Resp:  Normal effort  MSK:   Moves extremities without difficulty  Other:    Medical Decision Making  Medically screening exam initiated at 2:28 PM.  Appropriate orders placed.  Melody Myers was informed that the remainder of the evaluation will be completed by another provider, this initial triage assessment does not replace that evaluation, and the importance of remaining in the ED until their evaluation is complete.     Darrick Grinder, PA-C 08/23/21 1430

## 2021-08-23 NOTE — ED Triage Notes (Signed)
Patient complains of intermittent chest pain and shortness of breath that started two days ago. Patient also reports dizziness, nausea, and weakness. Patient is alert, oriented, ambulatory, and in no apparent distress at this time.

## 2021-09-03 ENCOUNTER — Encounter: Payer: Self-pay | Admitting: Family Medicine

## 2021-09-03 ENCOUNTER — Ambulatory Visit (INDEPENDENT_AMBULATORY_CARE_PROVIDER_SITE_OTHER): Payer: Medicare Other | Admitting: Family Medicine

## 2021-09-03 VITALS — BP 118/76 | HR 71 | Temp 97.6°F | Ht 64.0 in | Wt 140.2 lb

## 2021-09-03 DIAGNOSIS — J309 Allergic rhinitis, unspecified: Secondary | ICD-10-CM

## 2021-09-03 DIAGNOSIS — F319 Bipolar disorder, unspecified: Secondary | ICD-10-CM | POA: Diagnosis not present

## 2021-09-03 MED ORDER — LAMOTRIGINE 25 MG PO TABS: 25.0000 mg | ORAL_TABLET | Freq: Every day | ORAL | 1 refills | Status: DC

## 2021-09-03 MED ORDER — FLUTICASONE PROPIONATE 50 MCG/ACT NA SUSP
2.0000 | Freq: Every day | NASAL | 6 refills | Status: AC
Start: 2021-09-03 — End: ?

## 2021-09-03 NOTE — Progress Notes (Signed)
[facility] [address] [place] [address] Dept: [phone] Dept Fax: [phone]  Chronic Care Office Visit  Subjective:    Patient ID: Melody Myers, female    DOB: [birth year], 41 y.o..   MRN: [id number]  Chief Complaint  Patient presents with   Follow-up    4 wee f/u, sleeping a lot on new medication.     History of Present Illness:  Patient is in today for reassessment of her bipolar disorder. Ms. Christy has a history of bipolar disorder going back many years. At her last visit, she was noted to not be on medication for her bipolar disorder and has been unable to get established with a psychiatrist. I started her back on lithium, as she reported a good response to this in the past. She notes that she had been on this for about 2 weeks, but she noted various physical symptoms that she related to the medicine, including chest discomfort and irritability. She stopped the medicine about 4 days ago, and is now back to sleeping for long periods of time each day.  Past Medical History: Patient Active Problem List   Diagnosis Date Noted   History of non-ST elevation myocardial infarction (NSTEMI) 04/30/2021   Migraine without aura and without status migrainosus, not intractable 50/53/9767   Mild diastolic dysfunction 34/19/3790   Antral ulcer 10/27/2020   Allergic rhinitis 07/12/2020   Gastroesophageal reflux disease without esophagitis 07/12/2020   Jaw pain 02/24/2020   Bipolar 1 disorder (HCC)    Syncopal episodes    Coronary artery disease- Severe single-vessel CAD with thrombotic occlusion of proximal OM1 10/14/2018   Hyperlipidemia LDL goal <70 10/04/2018   History of adenomatous polyp of colon 04/17/2015   Urinary retention 11/14/2014   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   Chronic pain 03/20/2013   Tobacco use disorder 03/20/2013   Diabetic neuropathy ([place])    Type 1 diabetes mellitus with diabetic neuropathy,  unspecified ([place])    DKA (diabetic ketoacidoses) 03/19/2013   Moderate persistent asthma    Carpal tunnel syndrome 02/04/2013   Past Surgical History:  Procedure Laterality Date   CORONARY STENT INTERVENTION N/A 10/02/2018   Procedure: CORONARY STENT INTERVENTION;  Surgeon: Nelva Bush, MD;  Location: [facility];  Service: Cardiovascular;  Laterality: N/A;   LEFT HEART CATH AND CORONARY ANGIOGRAPHY N/A 10/02/2018   Procedure: LEFT HEART CATH AND CORONARY ANGIOGRAPHY;  Surgeon: Nelva Bush, MD;  Location: [facility];  Service: Cardiovascular;  Laterality: N/A;   TUBAL LIGATION     VAGINA RECONSTRUCTION SURGERY  10/2014   & ana, reconstruction   Family History  Problem Relation Age of Onset   Heart attack Father 45   Diabetes Sister    Heart attack Brother 29   Heart disease Brother    Diabetes Maternal Grandmother    COPD Paternal Grandmother    Outpatient Medications Prior to Visit  Medication Sig Dispense Refill   Accu-Chek Softclix Lancets lancets Use as instructed (Patient taking differently: 1 each by Other route daily. Use as instructed Glucose check) 100 each 12   acetaminophen (TYLENOL) 325 MG tablet Take 650 mg by mouth every 6 (six) hours as needed for moderate pain or headache.      amLODipine (NORVASC) 2.5 MG tablet Take 1 tablet (2.5 mg total) by mouth daily. 30 tablet 2   aspirin EC 81 MG EC tablet Take 1 tablet (81 mg total) by mouth daily.     atorvastatin (LIPITOR)  80 MG tablet TAKE 1 TABLET BY MOUTH DAILY AT 6 PM. (Patient taking differently: Take 80 mg by mouth daily.) 90 tablet 3   Blood Glucose Monitoring Suppl (ACCU-CHEK GUIDE) w/Device KIT Use as instructed to check blood sugar (Patient taking differently: 1 each by Other route See admin instructions. Use as instructed to check blood sugar) 1 kit 0   clopidogrel (PLAVIX) 75 MG tablet TAKE 1 TABLET BY MOUTH EVERY DAY (Patient taking differently: Take 75 mg by mouth daily.) 90 tablet 2    Continuous Blood Gluc Sensor (DEXCOM G6 SENSOR) MISC 1 Device by Does not apply route See admin instructions. Change every 10 days 9 each 3   Fluticasone-Umeclidin-Vilant (TRELEGY ELLIPTA) 100-62.5-25 MCG/ACT AEPB Inhale 1 puff into the lungs daily. 28 each 6   Glucagon, rDNA, (GLUCAGON EMERGENCY) 1 MG KIT Inject 1 mg as directed as needed. for low blood sugar episodes (Patient taking differently: Inject 1 mg as directed as needed (Low Blood Sugar). for low blood sugar episodes) 1 kit 11   glucose blood (ACCU-CHEK GUIDE) test strip Use as instructed to check blood sugar 4 times a day (Patient taking differently: 1 each by Other route as needed for other (see below). Use as instructed to check blood sugar 4 times a day) 100 each 12   insulin aspart (NOVOLOG) 100 UNIT/ML injection 3 times a day (just before each meal) 10-10-6 units (Patient taking differently: Inject 6-10 Units into the skin 3 (three) times daily with meals. 3 times a day (just before each meal) 10-10-6 units) 70 mL 3   levalbuterol (XOPENEX) 1.25 MG/3ML nebulizer solution USE 1 VIAL IN NEBULIZER EVERY 6 HOURS (Patient taking differently: Take 1.25 mg by nebulization every 6 (six) hours as needed for wheezing or shortness of breath.) 75 mL 0   loperamide (IMODIUM) 2 MG capsule Take 2 mg by mouth as needed for diarrhea or loose stools.     metoprolol tartrate (LOPRESSOR) 25 MG tablet TAKE 1 TABLET BY MOUTH TWICE A DAY (Patient taking differently: Take 25 mg by mouth 2 (two) times daily.) 180 tablet 3   montelukast (SINGULAIR) 10 MG tablet Take 1 tablet (10 mg total) by mouth at bedtime. 30 tablet 11   Multiple Vitamin (MULTIVITAMIN WITH MINERALS) TABS Take 1 tablet by mouth daily. Unknown strength     nitroGLYCERIN (NITROSTAT) 0.4 MG SL tablet PLACE 1 TABLET UNDER THE TONGUE EVERY 5 MINUTES X 3 DOSES AS NEEDED FOR CHEST PAIN. MAX3TAB/15MIN (Patient taking differently: Place 0.4 mg under the tongue every 5 (five) minutes as needed for chest  pain. PLACE 1 TABLET UNDER THE TONGUE EVERY 5 MINUTES X 3 DOSES AS NEEDED FOR CHEST PAIN. MAX3TAB/15MIN) 25 tablet 3   ondansetron (ZOFRAN) 4 MG tablet Take 1 tablet (4 mg total) by mouth every 8 (eight) hours as needed for nausea or vomiting. 20 tablet 2   pantoprazole (PROTONIX) 40 MG tablet Take 1 tablet (40 mg total) by mouth daily. 90 tablet 3   rizatriptan (MAXALT) 5 MG tablet Take 1 tablet (5 mg total) by mouth as needed for migraine. May repeat in 2 hours if needed 10 tablet 2   triamcinolone cream (KENALOG) 0.1 % Apply 1 application topically 2 (two) times daily. 30 g 0   VENTOLIN HFA 108 (90 Base) MCG/ACT inhaler Inhale 2 puffs into the lungs every 4 (four) hours as needed for wheezing or shortness of breath.     fluticasone (FLONASE) 50 MCG/ACT nasal spray Place 2 sprays into both  nostrils daily. 2 sprays each nostril at night 16 g 6   lithium carbonate (LITHOBID) 300 MG CR tablet Take 1 tablet (300 mg total) by mouth 2 (two) times daily. (Patient not taking: Reported on 09/03/2021) 60 tablet 1   ranolazine (RANEXA) 500 MG 12 hr tablet Take 1 tablet (500 mg total) by mouth 2 (two) times daily. (Patient not taking: Reported on 08/15/2021) 180 tablet 2   No facility-administered medications prior to visit.   Allergies  Allergen Reactions   Amoxicillin Itching   Penicillins Hives, Itching and Swelling    Did it involve swelling of the face/tongue/throat, SOB, or low BP? No Did it involve sudden or severe rash/hives, skin peeling, or any reaction on the inside of your mouth or nose? No Did you need to seek medical attention at a hospital or doctor's office? No When did it last happen?      Childhood If all above answers are "NO", may proceed with cephalosporin use.   Prednisone Itching and Swelling   Objective:   Today's Vitals   09/03/21 1018  BP: 118/76  Pulse: 71  Temp: 97.6 F (36.4 C)  TempSrc: Temporal  SpO2: 96%  Weight: 140 lb 3.2 oz (63.6 kg)  Height: _0  (1.626 m)    Body mass index is 24.07 kg/m.   General: Well developed, well nourished. No acute distress. Psych: Alert and oriented. Normal mood and affect.  Health Maintenance Due  Topic Date Due   OPHTHALMOLOGY EXAM  04/25/2021     Assessment & Plan:   1. Bipolar 1 disorder ([place]) Ms. Bowring was unable to tolerate lithium this time. I really do feel she needs to be on a mood stabilizer prior to trying something for her depression and increased sleep. I will start her on lamotrigine. Because of her adherence issues, I will keep the dose at 25 mg daily for 4 weeks and then see her back, if she is doing well, I will plan to increase her dose to 50 mg daily at that point. Ultimately, I would shoot for 100 mg daily. When stable, I will consider starting some bupropion for her depression. This may also help her with smoking cessation. As it is activating, this may help to reduce some of the hypersomnolence.  - lamoTRIgine (LAMICTAL) 25 MG tablet; Take 1 tablet (25 mg total) by mouth daily.  Dispense: 30 tablet; Refill: 1  2. Allergic rhinitis, unspecified seasonality, unspecified trigger Patient notes she needs her Flonase refilled. Allergies are doing well.  - fluticasone (FLONASE) 50 MCG/ACT nasal spray; Place 2 sprays into both nostrils daily. 2 sprays each nostril at night  Dispense: 16 g; Refill: 6   Return in about 4 weeks (around 10/01/2021).   Haydee Salter, MD

## 2021-09-21 ENCOUNTER — Ambulatory Visit (INDEPENDENT_AMBULATORY_CARE_PROVIDER_SITE_OTHER): Payer: Medicare Other | Admitting: Internal Medicine

## 2021-09-21 ENCOUNTER — Encounter: Payer: Self-pay | Admitting: Internal Medicine

## 2021-09-21 VITALS — BP 100/68 | HR 92 | Ht 64.0 in | Wt 137.6 lb

## 2021-09-21 DIAGNOSIS — I25119 Atherosclerotic heart disease of native coronary artery with unspecified angina pectoris: Secondary | ICD-10-CM

## 2021-09-21 DIAGNOSIS — E1059 Type 1 diabetes mellitus with other circulatory complications: Secondary | ICD-10-CM | POA: Diagnosis not present

## 2021-09-21 DIAGNOSIS — E785 Hyperlipidemia, unspecified: Secondary | ICD-10-CM | POA: Diagnosis not present

## 2021-09-21 DIAGNOSIS — E109 Type 1 diabetes mellitus without complications: Secondary | ICD-10-CM | POA: Insufficient documentation

## 2021-09-21 MED ORDER — DEXCOM G6 TRANSMITTER MISC: 1.0000 | 3 refills | Status: DC

## 2021-09-21 MED ORDER — OMNIPOD 5 DEXG7G6 INTRO GEN 5 KIT: 1.0000 | PACK | 0 refills | Status: DC | PRN

## 2021-09-21 MED ORDER — OMNIPOD 5 DEXG7G6 PODS GEN 5 MISC: 1.0000 | 3 refills | Status: DC

## 2021-09-21 MED ORDER — DEXCOM G6 SENSOR MISC: 1.0000 | 3 refills | Status: AC

## 2021-09-21 MED ORDER — DEXCOM G6 RECEIVER DEVI: 1.0000 | Freq: Once | 0 refills | Status: AC

## 2021-09-21 NOTE — Progress Notes (Signed)
Patient ID: Melody Myers, female   DOB: [birth year], 41 y.o.   MRN: [id number]  HPI: Melody Myers is a 41 y.o.-year-old female, returning for follow-up for DM1, diagnosed in 2001, uncontrolled, with complications (history of DKA, CAD, NPDR, gastroparesis, PN, history of foot ulcer).  She previously saw Dr. Loanne Drilling, last visit 5.5 months ago. Prev. Went to [facility].  Reviewed HbA1c levels: Lab Results  Component Value Date   HGBA1C 7.6 (H) 08/06/2021   HGBA1C 8.6 (A) 04/05/2021   HGBA1C 8.1 (A) 12/19/2020   CGM: -Dexcom G6 >> now off  Insulin: -NovoLog  Supplier: - [facility]  Patient was previously on insulin pump (Omnipod) in 2013 to early 2022.  She stopped when she changed endocrinologists.  She is currently on: - Novolog 10-12 units, ICR 1:2 carb equiv., target 110, ISF ~25 - Basaglar 28 units hs  Meter: Accu-Chek  Pt checks her sugars 4x a day: - am: 80-90s - 2h after b'fast: n/c - lunch: 300s - 2h after lunch: n/c - dinner: 300s - 2h after dinner: ? - bedtime: 50s  Lowest sugar was 39; she has hypoglycemia awareness at 60.  She has a glucagon kit at home. No previous hypoglycemia admission.  Highest sugar was in the 500s.  She has a history of DKA.  Pt's meals are usually small.  - no CKD, last BUN/creatinine:  Lab Results  Component Value Date   BUN 7 08/23/2021   BUN 7 08/06/2021   CREATININE 0.64 08/23/2021   CREATININE 0.70 08/06/2021   -+ History of HL; last set of lipids: Lab Results  Component Value Date   CHOL 106 08/06/2021   HDL 39.40 08/06/2021   LDLCALC 46 08/06/2021   TRIG 101.0 08/06/2021   CHOLHDL 3 08/06/2021  She is is on Lipitor 80 mg daily.  - last eye exam was in 2021. ? DR.  - + numbness and tingling in her feet.  Last foot exam 10/19/2020.  Last TSH: No results found for: "TSH"  She has a history of bipolar disease.  ROS: No increased urination, + blurry vision. No nausea, chest pain.  Past Medical History:   Diagnosis Date   Anxiety    Asthma    Bipolar 1 disorder ([place])    Bipolar depression ([place]) 03/20/2013   Carpal tunnel syndrome 02/04/2013   Formatting of this note might be different from the original. 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 Repaired at [facility] in Sept 2016.  Pt states that all problems are still present.  If followed by [facility] for this. She has f/u appt.  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 10/14/2018   Degenerative disc disease, lumbar 06/05/2015   Formatting of this note might be different from the original. L4-5 > L3-4, mild   Depression    patient reports bipolor   Depression    Diabetic neuropathy ([place])    DKA (diabetic ketoacidoses) 03/19/2013   History of adenomatous polyp of colon 04/17/2015   Formatting of this note might be different from the original. Per path 2016   Hyperlipidemia LDL goal <70 10/04/2018   Hypokalemia 11/15/2014   Ingrown toenail of both feet 04/12/2019   Formatting of this note might be different from the original. great toenails bilateral  Insulin dependent diabetes mellitus with complications    insulin dependant   Jaw pain 02/24/2020   Leukocytosis 03/20/2013   Myocardial infarction ([place]) 10/02/2018   Neuropathy    Non-ST elevation (NSTEMI) myocardial infarction [facility]) 10/02/2018   NSTEMI (non-ST elevated myocardial infarction) ([place]) 10/02/2018   Superficial incisional surgical site infection 11/14/2014   Syncopal episodes    Tobacco abuse 03/20/2013   Urinary retention 11/14/2014   Past Surgical History:  Procedure Laterality Date   CORONARY STENT INTERVENTION N/A 10/02/2018   Procedure: CORONARY STENT INTERVENTION;  Surgeon: Nelva Bush, MD;  Location: [facility];  Service: Cardiovascular;   Laterality: N/A;   LEFT HEART CATH AND CORONARY ANGIOGRAPHY N/A 10/02/2018   Procedure: LEFT HEART CATH AND CORONARY ANGIOGRAPHY;  Surgeon: Nelva Bush, MD;  Location: [facility];  Service: Cardiovascular;  Laterality: N/A;   TUBAL LIGATION     VAGINA RECONSTRUCTION SURGERY  10/2014   & ana, reconstruction   Social History   Socioeconomic History   Marital status: Widowed    Spouse name: Not on file   Number of children: 2   Years of education: Not on file   Highest education level: Not on file  Occupational History   Occupation: Disabled  Tobacco Use   Smoking status: Every Day    Packs/day: 0.25    Types: Cigarettes   Smokeless tobacco: Never   Tobacco comments:    2-3 cigarettes per day  Vaping Use   Vaping Use: Never used  Substance and Sexual Activity   Alcohol use: Not Currently    Comment: occasional   Drug use: No   Sexual activity: Yes    Comment: tubal ligation  Other Topics Concern   Not on file  Social History Narrative   Not on file   Social Determinants of Health   Financial Resource Strain: High Risk (10/02/2018)   Overall Financial Resource Strain (CARDIA)    Difficulty of Paying Living Expenses: Hard  Food Insecurity: Food Insecurity Present (10/02/2018)   Hunger Vital Sign    Worried About Running Out of Food in the Last Year: Often true    Ran Out of Food in the Last Year: Sometimes true  Transportation Needs: Unmet Transportation Needs (10/02/2018)   PRAPARE - Hydrologist (Medical): Yes    Lack of Transportation (Non-Medical): Yes  Physical Activity: Insufficiently Active (10/02/2018)   Exercise Vital Sign    Days of Exercise per Week: 1 day    Minutes of Exercise per Session: 30 min  Stress: Stress Concern Present (10/02/2018)   [employer] [place]    Feeling of Stress : Very much  Social Connections: Socially Isolated (10/02/2018)   Social Connection  and Isolation Panel [NHANES]    Frequency of Communication with Friends and Family: More than three times a week    Frequency of Social Gatherings with Friends and Family: More than three times a week    Attends Religious Services: Never    Marine scientist or Organizations: No    Attends Archivist Meetings: Never    Marital Status: Widowed  Intimate Partner Violence: Not At Risk (10/02/2018)   Humiliation, Afraid, Rape, and Kick questionnaire    Fear of Current or Ex-Partner: No    Emotionally Abused: No    Physically Abused: No    Sexually Abused: No   Current Outpatient Medications on File Prior to Visit  Medication Sig  Dispense Refill   Accu-Chek Softclix Lancets lancets Use as instructed (Patient taking differently: 1 each by Other route daily. Use as instructed Glucose check) 100 each 12   acetaminophen (TYLENOL) 325 MG tablet Take 650 mg by mouth every 6 (six) hours as needed for moderate pain or headache.      amLODipine (NORVASC) 2.5 MG tablet Take 1 tablet (2.5 mg total) by mouth daily. 30 tablet 2   aspirin EC 81 MG EC tablet Take 1 tablet (81 mg total) by mouth daily.     atorvastatin (LIPITOR) 80 MG tablet TAKE 1 TABLET BY MOUTH DAILY AT 6 PM. (Patient taking differently: Take 80 mg by mouth daily.) 90 tablet 3   Blood Glucose Monitoring Suppl (ACCU-CHEK GUIDE) w/Device KIT Use as instructed to check blood sugar (Patient taking differently: 1 each by Other route See admin instructions. Use as instructed to check blood sugar) 1 kit 0   clopidogrel (PLAVIX) 75 MG tablet TAKE 1 TABLET BY MOUTH EVERY DAY (Patient taking differently: Take 75 mg by mouth daily.) 90 tablet 2   Continuous Blood Gluc Sensor (DEXCOM G6 SENSOR) MISC 1 Device by Does not apply route See admin instructions. Change every 10 days 9 each 3   fluticasone (FLONASE) 50 MCG/ACT nasal spray Place 2 sprays into both nostrils daily. 2 sprays each nostril at night 16 g 6   Fluticasone-Umeclidin-Vilant  (TRELEGY ELLIPTA) 100-62.5-25 MCG/ACT AEPB Inhale 1 puff into the lungs daily. 28 each 6   Glucagon, rDNA, (GLUCAGON EMERGENCY) 1 MG KIT Inject 1 mg as directed as needed. for low blood sugar episodes (Patient taking differently: Inject 1 mg as directed as needed (Low Blood Sugar). for low blood sugar episodes) 1 kit 11   glucose blood (ACCU-CHEK GUIDE) test strip Use as instructed to check blood sugar 4 times a day (Patient taking differently: 1 each by Other route as needed for other (see below). Use as instructed to check blood sugar 4 times a day) 100 each 12   insulin aspart (NOVOLOG) 100 UNIT/ML injection 3 times a day (just before each meal) 10-10-6 units (Patient taking differently: Inject 6-10 Units into the skin 3 (three) times daily with meals. 3 times a day (just before each meal) 10-10-6 units) 70 mL 3   lamoTRIgine (LAMICTAL) 25 MG tablet Take 1 tablet (25 mg total) by mouth daily. 30 tablet 1   levalbuterol (XOPENEX) 1.25 MG/3ML nebulizer solution USE 1 VIAL IN NEBULIZER EVERY 6 HOURS (Patient taking differently: Take 1.25 mg by nebulization every 6 (six) hours as needed for wheezing or shortness of breath.) 75 mL 0   loperamide (IMODIUM) 2 MG capsule Take 2 mg by mouth as needed for diarrhea or loose stools.     metoprolol tartrate (LOPRESSOR) 25 MG tablet TAKE 1 TABLET BY MOUTH TWICE A DAY (Patient taking differently: Take 25 mg by mouth 2 (two) times daily.) 180 tablet 3   montelukast (SINGULAIR) 10 MG tablet Take 1 tablet (10 mg total) by mouth at bedtime. 30 tablet 11   Multiple Vitamin (MULTIVITAMIN WITH MINERALS) TABS Take 1 tablet by mouth daily. Unknown strength     nitroGLYCERIN (NITROSTAT) 0.4 MG SL tablet PLACE 1 TABLET UNDER THE TONGUE EVERY 5 MINUTES X 3 DOSES AS NEEDED FOR CHEST PAIN. MAX3TAB/15MIN (Patient taking differently: Place 0.4 mg under the tongue every 5 (five) minutes as needed for chest pain. PLACE 1 TABLET UNDER THE TONGUE EVERY 5 MINUTES X 3 DOSES AS NEEDED FOR  CHEST PAIN. MAX3TAB/15MIN)  25 tablet 3   ondansetron (ZOFRAN) 4 MG tablet Take 1 tablet (4 mg total) by mouth every 8 (eight) hours as needed for nausea or vomiting. 20 tablet 2   pantoprazole (PROTONIX) 40 MG tablet Take 1 tablet (40 mg total) by mouth daily. 90 tablet 3   rizatriptan (MAXALT) 5 MG tablet Take 1 tablet (5 mg total) by mouth as needed for migraine. May repeat in 2 hours if needed 10 tablet 2   triamcinolone cream (KENALOG) 0.1 % Apply 1 application topically 2 (two) times daily. 30 g 0   VENTOLIN HFA 108 (90 Base) MCG/ACT inhaler Inhale 2 puffs into the lungs every 4 (four) hours as needed for wheezing or shortness of breath.     No current facility-administered medications on file prior to visit.   Allergies  Allergen Reactions   Amoxicillin Itching   Penicillins Hives, Itching and Swelling    Did it involve swelling of the face/tongue/throat, SOB, or low BP? No Did it involve sudden or severe rash/hives, skin peeling, or any reaction on the inside of your mouth or nose? No Did you need to seek medical attention at a hospital or doctor's office? No When did it last happen?      Childhood If all above answers are "NO", may proceed with cephalosporin use.   Prednisone Itching and Swelling   Family History  Problem Relation Age of Onset   Heart attack Father 12   Diabetes Sister    Heart attack Brother 14   Heart disease Brother    Diabetes Maternal Grandmother    COPD Paternal Grandmother    PE: BP 100/68 (BP Location: Left Arm, Patient Position: Sitting, Cuff Size: Normal)   Pulse 92   Ht _0  (1.626 m)   Wt 137 lb 9.6 oz (62.4 kg)   LMP 08/09/2021 (Approximate)   SpO2 98%   BMI 23.62 kg/m  Wt Readings from Last 3 Encounters:  09/21/21 137 lb 9.6 oz (62.4 kg)  09/03/21 140 lb 3.2 oz (63.6 kg)  08/15/21 138 lb 9.6 oz (62.9 kg)   Constitutional: normal weight, in NAD Eyes: no exophthalmos ENT: moist mucous membranes, no masses palpated in neck, no cervical  lymphadenopathy Cardiovascular: Tachycardia, RR, No MRG Respiratory: CTA B Musculoskeletal: no deformities Skin: moist, warm, no rashes Neurological: no tremor with outstretched hands  ASSESSMENT: 1. DM1, uncontrolled, with complications - history of DKA - CAD - NPDR - gastroparesis - PN - history of foot ulcer  2. HL  PLAN:  1. Patient with long-standing, uncontrolled DM1.  Latest HbA1c obtained a month ago was 7.6%, improved. -She was previously on insulin pump but came off when she changed endocrinologists.  She is interested in restarting this.  At today's visit, we discussed about using the OmniPod 5+ Dexcom G6 system.  I explained the [facility] does not integrate with OmniPod yet.  She already has the OmniPod 5 PDM at home, and only needs the pods.  I sent all the needed supplies to her pharmacy and I referred her to diabetes education. -In the meantime, reviewing her blood sugars at home, they are at goal in the morning but they increase significantly, mostly to 300s later in the day.  Starting the insulin pump would be the best to improve her blood sugars but for now, I advised her to split her Basaglar in 2 equal doses.  I also advised her to take NovoLog 15 minutes before each meal.  She mentions that she was  previously advised not to take NovoLog if the sugars before the meal are 100 or lower.  Since her sugars are usually lower than this in the morning, she is not taking NovoLog with breakfast, which most likely is the cause of her significant increase in blood sugars after this meal.  I strongly advised her to take NovoLog before breakfast even if the sugars are at goal but she may need lower doses if they are between 60 and 90s and she may need to skip the dose if the sugars are lower than that. - I suggested to: Patient Instructions  Please split: - Basaglar 14 units 2x a day  Please take Novolog 15 min before meal: If the sugars are 70-90, take the full Novolog 10-12  units If the sugars are 60-70, take 50% of the NovoLog dose at the start of the meal  Try to start the pump.  Please return in 3-4 months with your sugar log.   - given instructions for hypoglycemia management "15-15 rule"  - advised for yearly eye exams-she is not up-to-date - advised to get ketone strips - advised to always have Glu tablets with her - advised for a Med-alert bracelet mentioning "type 1 diabetes mellitus". - given instruction Re: exercising and driving in DM1 (pt instructions) - also, given information about sick day rules - no signs of other autoimmune disorders - Return to clinic in 3-4 mo  2. HL - Reviewed latest lipid panel: All fractions at goal: Lab Results  Component Value Date   CHOL 106 08/06/2021   HDL 39.40 08/06/2021   LDLCALC 46 08/06/2021   TRIG 101.0 08/06/2021   CHOLHDL 3 08/06/2021  -She is is on Lipitor 80 mg daily.  Orders Placed This Encounter  Procedures   Amb Referral to DSME/T   - Total time spent for the visit: 40 min, in precharting, reviewing Dr. Cordelia Pen last note, obtaining medical information from the chart and from the pt, reviewing her  previous labs, evaluations, and treatments, reviewing her symptoms, counseling her about her diabetes (please see the discussed topics above), and developing a plan to further treat it; she had a number of questions which I addressed.  Philemon Kingdom, MD PhD [facility]

## 2021-09-21 NOTE — Patient Instructions (Addendum)
Please split: - Basaglar 14 units 2x a day  Please take Novolog 15 min before meal: If the sugars are 70-90, take the full Novolog 10-12 units If the sugars are 60-70, take 50% of the NovoLog dose at the start of the meal  Try to start the pump.  Please return in 3-4 months with your sugar log.   Basic Rules for Patients with Type I Diabetes Mellitus  The American Diabetes Association (ADA) recommended targets: - fasting sugar 80-130 - after meal sugar <180 - HbA1C <7%  Engage in ?150 min moderate exercise per week  Make sure you have ?8h of sleep every night as this helps both blood sugars and your weight.  Always keep a sugar log (not only record in your meter) and bring it to all appointments with [place].  If you are on a pump, know how to access the settings and to modify the parameters.   Remember, you can always call the number on the back of the pump for emergencies related to the pump.  "15-15 rule" for hypoglycemia: if sugars are low, take 15 g of carbs** ("fast sugar" - e.g. 4 glucose tablets, 4 oz orange juice), wait 15 min, then check sugars again. If still <80, repeat. Continue  until your sugars >80, then eat a normal meal.   Teach family members and coworkers to inject glucagon. Have a glucagon set at home and one at work. They should call 911 after using the set.  If you are on a pump, set "insulin on board" time for 5 hours (if your sugars tend to be higher, can use 4 hours).   If you are on a pump, use the "dual wave bolus" setting for high fat foods (e.g. pizza). Start with a setting of 50%-50% (50% instant bolus and 50% prolonged bolus over 3h, for e.g.).    If you are on a pump, make sure the basal daily insulin dose is approximately equal (not larger) to the daily insulin you get from boluses, otherwise you are at risk for hypoglycemia.  Check sugar before driving. If <100, correct, and only start driving if sugars rise ?716. Check sugar every hour when on a  long drive.  Check sugar before exercising. If <100, correct, and only start exercising if sugars rise ?100. Check sugar every hour when on a long exercise routine and 1h after you finished exercising.   If >250, check urine for ketones. If you have moderate-large ketones in urine, do not start exercise. Hydrate yourself with clear liquids and correct the high sugar. Recheck sugars and ketones before attempting to exercise.  Be aware that you might need less insulin when exercising.  *intense, short, exercise bursts can increase your sugars, but  *less intense, longer (>1h), exercise routines can decrease your sugars.  If you are on a pump, you might need to decrease your basal rate by 10% or more (or even disconnect your pump) while you exercise to prevent low sugars. Do not disconnect your pump by more than 3 hours at a time! You also might need to decrease your insulin bolus for the meal prior to your exercise time by 20% or more.  Make sure you have a MedAlert bracelet or pendant mentioning "Type I Diabetes Mellitus". If you have a prior episode of severe hypoglycemia or hypoglycemia unawareness, it should also mention this.  Please do not walk barefoot. Inspect your feet for sores/cuts and let us know if you have them.  **E.g. of "fast  carbs": first choice (15 g):  1 tube glucose gel, GlucoPouch 15, 2 oz glucose liquid second choice (15-16 g):  3 or 4 glucose tablets (best taken  with water), 15 Dextrose Bits chewable third choice (15-20 g):   cup fruit juice,  cup regular soda, 1 cup skim milk,  1 cup sports drink fourth choice (15-20 g):  1 small tube [place] gel (not frosting), 2 tbsp raisins, 1 tbsp table sugar,  candy, jelly beans, gum drops - check package for carb amount   (adapted from: Juluis Rainier. "Insulin therapy and hypoglycemia" Endocrinol Metab Clin N Am 2012, 41: 57-87)  Sick Day Rules for Diabetes  Think S-K-I-L-L:  Sugars:  - if glucose >200, check  every 3h and drink sugar free liquids  - if glucose <200, drink carb-containing liquids and recheck 30 min later  - if glucose high, correct with insulin  - if sugars <60, initiate hypoglycemia management (take 15 g of fast carbs and check sugars in 15 min  - repeat until sugars remain >100).  Ketones:  When to check ketones?  When glucose >300 x2 if on insulin injections (>300 x 1 if on insulin pump). When nausea, vomiting, diarrhea, abdominal pain, headache, fever - even if glucose is normal or low - because in this case, you need both glucose and insulin.    - if you have ketone strips for blood >> if ketones are more or equal than 0.6, need to increase insulin - if you have ketone strips for urine >> if ketones are more or equal than "small", need to increase insulin  Insulin: Never skip long acting insulin, even if not eating!   Urine ketones Blood ketones Extra insulin?  no <0.6 no  small 0.6-1.5 Increase dose by 5%  moderate 1.5-3 Increase dose by 10%  large >3 Increase dose by at least 20%   Liquids: - if glucose >200, check every 3h and drink sugar free liquids  - if glucose <200, small sips of carb-containing liquids (e.g. Ginger ale, Gatorade, juice, etc.)  Let us know!   Call us if: Go to ED if: Call your primary care doctor if:  Sugars >300 for >8h Severe abdominal pain Fever >100F for 24h  Moderate to large  urine ketones or blood ketones >1.5 Difficulty breathing Other chronic diseases flaring up  Vomiting and unable to keep liquids down Signs of dehydration

## 2021-10-02 ENCOUNTER — Ambulatory Visit (INDEPENDENT_AMBULATORY_CARE_PROVIDER_SITE_OTHER): Payer: Medicare Other | Admitting: Family Medicine

## 2021-10-02 ENCOUNTER — Ambulatory Visit: Payer: Medicare Other | Admitting: Family Medicine

## 2021-10-02 VITALS — BP 118/72 | HR 88 | Temp 97.4°F | Ht 64.0 in | Wt 141.2 lb

## 2021-10-02 DIAGNOSIS — R8281 Pyuria: Secondary | ICD-10-CM

## 2021-10-02 DIAGNOSIS — J4541 Moderate persistent asthma with (acute) exacerbation: Secondary | ICD-10-CM | POA: Diagnosis not present

## 2021-10-02 LAB — POCT URINALYSIS DIPSTICK
Bilirubin, UA: NEGATIVE
Blood, UA: NEGATIVE
Glucose, UA: NEGATIVE
Ketones, UA: NEGATIVE
Nitrite, UA: NEGATIVE
Protein, UA: NEGATIVE
Spec Grav, UA: 1.02 (ref 1.010–1.025)
Urobilinogen, UA: NEGATIVE E.U./dL — AB
pH, UA: 6 (ref 5.0–8.0)

## 2021-10-02 MED ORDER — SULFAMETHOXAZOLE-TRIMETHOPRIM 800-160 MG PO TABS: 1.0000 | ORAL_TABLET | Freq: Two times a day (BID) | ORAL | 0 refills | Status: DC

## 2021-10-02 MED ORDER — METHYLPREDNISOLONE SODIUM SUCC 125 MG IJ SOLR
125.0000 mg | Freq: Once | INTRAMUSCULAR | Status: AC
  Administered 2021-10-02: 125 mg via INTRAMUSCULAR

## 2021-10-02 NOTE — Progress Notes (Signed)
[facility] [address] [place] [address] Dept: [phone] Dept Fax: [phone]  Office Visit  Subjective:    Patient ID: Melody Myers, female    DOB: 01-23-1981, 41 y.o..   MRN: [id number]  Chief Complaint  Patient presents with   Acute Visit    C/o having blood in urine, dysuria x 3 days.      History of Present Illness:  Patient is in today for complaining of a red tinge to her urine and dysuria for the past 3 days. She notes she was seen at [facility] a few days ago with a respiratory illness. She has a history of asthma. She was given a prescription for azithromycin, prednisone, and promethazine. Melody Myers notes she did not take the prednisone, as she had a prio issue with itching associated with this medicine. Despite her reaction to prednisone, she uses both fluticasone nasal spray and a combination inhaler for her asthma that also includes fluticasone without any toruble.  Past Medical History: Patient Active Problem List   Diagnosis Date Noted   Diabetes mellitus type I ([place]) 09/21/2021   History of non-ST elevation myocardial infarction (NSTEMI) 04/30/2021   Migraine without aura and without status migrainosus, not intractable 82/70/7867   Mild diastolic dysfunction 54/49/2010   Antral ulcer 10/27/2020   Allergic rhinitis 07/12/2020   Gastroesophageal reflux disease without esophagitis 07/12/2020   Jaw pain 02/24/2020   Bipolar 1 disorder (HCC)    Syncopal episodes    Coronary artery disease- Severe single-vessel CAD with thrombotic occlusion of proximal OM1 10/14/2018   Hyperlipidemia LDL goal <70 10/04/2018   History of adenomatous polyp of colon 04/17/2015   Urinary retention 11/14/2014   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   Chronic pain 03/20/2013   Tobacco use disorder 03/20/2013   Diabetic neuropathy ([place])    Type 1 diabetes mellitus with diabetic neuropathy, unspecified ([place])    DKA (diabetic ketoacidoses)  03/19/2013   Moderate persistent asthma    Carpal tunnel syndrome 02/04/2013   Past Surgical History:  Procedure Laterality Date   CORONARY STENT INTERVENTION N/A 10/02/2018   Procedure: CORONARY STENT INTERVENTION;  Surgeon: Nelva Bush, MD;  Location: [facility];  Service: Cardiovascular;  Laterality: N/A;   LEFT HEART CATH AND CORONARY ANGIOGRAPHY N/A 10/02/2018   Procedure: LEFT HEART CATH AND CORONARY ANGIOGRAPHY;  Surgeon: Nelva Bush, MD;  Location: [facility];  Service: Cardiovascular;  Laterality: N/A;   TUBAL LIGATION     VAGINA RECONSTRUCTION SURGERY  10/2014   & ana, reconstruction   Family History  Problem Relation Age of Onset   Heart attack Father 54   Diabetes Sister    Heart attack Brother 29   Heart disease Brother    Diabetes Maternal Grandmother    COPD Paternal Grandmother    Outpatient Medications Prior to Visit  Medication Sig Dispense Refill   Accu-Chek Softclix Lancets lancets Use as instructed (Patient taking differently: 1 each by Other route daily. Use as instructed Glucose check) 100 each 12   acetaminophen (TYLENOL) 325 MG tablet Take 650 mg by mouth every 6 (six) hours as needed for moderate pain or headache.      amLODipine (NORVASC) 2.5 MG tablet Take 1 tablet (2.5 mg total) by mouth daily. 30 tablet 2   aspirin EC 81 MG EC tablet Take 1 tablet (81 mg total) by mouth daily.     atorvastatin (LIPITOR) 80 MG tablet TAKE 1 TABLET BY MOUTH DAILY AT  6 PM. (Patient taking differently: Take 80 mg by mouth daily.) 90 tablet 3   azithromycin (ZITHROMAX) 250 MG tablet Take 250 mg by mouth as directed.     Blood Glucose Monitoring Suppl (ACCU-CHEK GUIDE) w/Device KIT Use as instructed to check blood sugar (Patient taking differently: 1 each by Other route See admin instructions. Use as instructed to check blood sugar) 1 kit 0   clopidogrel (PLAVIX) 75 MG tablet TAKE 1 TABLET BY MOUTH EVERY DAY (Patient taking differently: Take 75 mg by mouth  daily.) 90 tablet 2   Continuous Blood Gluc Sensor (DEXCOM G6 SENSOR) MISC 1 Device by Does not apply route See admin instructions. Change every 10 days 9 each 3   Continuous Blood Gluc Transmit (DEXCOM G6 TRANSMITTER) MISC 1 Device by Does not apply route every 3 (three) months. 1 each 3   fluticasone (FLONASE) 50 MCG/ACT nasal spray Place 2 sprays into both nostrils daily. 2 sprays each nostril at night 16 g 6   Fluticasone-Umeclidin-Vilant (TRELEGY ELLIPTA) 100-62.5-25 MCG/ACT AEPB Inhale 1 puff into the lungs daily. 28 each 6   Glucagon, rDNA, (GLUCAGON EMERGENCY) 1 MG KIT Inject 1 mg as directed as needed. for low blood sugar episodes (Patient taking differently: Inject 1 mg as directed as needed (Low Blood Sugar). for low blood sugar episodes) 1 kit 11   glucose blood (ACCU-CHEK GUIDE) test strip Use as instructed to check blood sugar 4 times a day (Patient taking differently: 1 each by Other route as needed for other (see below). Use as instructed to check blood sugar 4 times a day) 100 each 12   insulin aspart (NOVOLOG) 100 UNIT/ML injection 3 times a day (just before each meal) 10-10-6 units (Patient taking differently: Inject 6-10 Units into the skin 3 (three) times daily with meals. 3 times a day (just before each meal) 10-10-6 units) 70 mL 3   Insulin Disposable Pump (OMNIPOD 5 G6 INTRO, GEN 5,) KIT 1 each by Does not apply route as needed. 1 kit 0   Insulin Disposable Pump (OMNIPOD 5 G6 POD, GEN 5,) MISC 1 each by Does not apply route every 3 (three) days. 30 each 3   lamoTRIgine (LAMICTAL) 25 MG tablet Take 1 tablet (25 mg total) by mouth daily. 30 tablet 1   levalbuterol (XOPENEX) 1.25 MG/3ML nebulizer solution USE 1 VIAL IN NEBULIZER EVERY 6 HOURS (Patient taking differently: Take 1.25 mg by nebulization every 6 (six) hours as needed for wheezing or shortness of breath.) 75 mL 0   loperamide (IMODIUM) 2 MG capsule Take 2 mg by mouth as needed for diarrhea or loose stools.     metoprolol  tartrate (LOPRESSOR) 25 MG tablet TAKE 1 TABLET BY MOUTH TWICE A DAY (Patient taking differently: Take 25 mg by mouth 2 (two) times daily.) 180 tablet 3   montelukast (SINGULAIR) 10 MG tablet Take 1 tablet (10 mg total) by mouth at bedtime. 30 tablet 11   Multiple Vitamin (MULTIVITAMIN WITH MINERALS) TABS Take 1 tablet by mouth daily. Unknown strength     nitroGLYCERIN (NITROSTAT) 0.4 MG SL tablet PLACE 1 TABLET UNDER THE TONGUE EVERY 5 MINUTES X 3 DOSES AS NEEDED FOR CHEST PAIN. MAX3TAB/15MIN (Patient taking differently: Place 0.4 mg under the tongue every 5 (five) minutes as needed for chest pain. PLACE 1 TABLET UNDER THE TONGUE EVERY 5 MINUTES X 3 DOSES AS NEEDED FOR CHEST PAIN. MAX3TAB/15MIN) 25 tablet 3   ondansetron (ZOFRAN) 4 MG tablet Take 1 tablet (4 mg total)  by mouth every 8 (eight) hours as needed for nausea or vomiting. 20 tablet 2   pantoprazole (PROTONIX) 40 MG tablet Take 1 tablet (40 mg total) by mouth daily. 90 tablet 3   rizatriptan (MAXALT) 5 MG tablet Take 1 tablet (5 mg total) by mouth as needed for migraine. May repeat in 2 hours if needed 10 tablet 2   triamcinolone cream (KENALOG) 0.1 % Apply 1 application topically 2 (two) times daily. 30 g 0   VENTOLIN HFA 108 (90 Base) MCG/ACT inhaler Inhale 2 puffs into the lungs every 4 (four) hours as needed for wheezing or shortness of breath.     predniSONE (DELTASONE) 10 MG tablet Take by mouth.     No facility-administered medications prior to visit.   Allergies  Allergen Reactions   Amoxicillin Itching   Penicillins Hives, Itching and Swelling    Did it involve swelling of the face/tongue/throat, SOB, or low BP? No Did it involve sudden or severe rash/hives, skin peeling, or any reaction on the inside of your mouth or nose? No Did you need to seek medical attention at a hospital or doctor's office? No When did it last happen?      Childhood If all above answers are "NO", may proceed with cephalosporin use.   Prednisone  Itching and Swelling     Objective:   Today's Vitals   10/02/21 1333  BP: 118/72  Pulse: 88  Temp: (!) 97.4 F (36.3 C)  TempSrc: Temporal  SpO2: 95%  Weight: 141 lb 3.2 oz (64 kg)  Height: '5\' 4"'  (1.626 m)   Body mass index is 24.24 kg/m.   General: Well developed, well nourished. No acute distress. Lungs: Diffuse wheezing throughout lung fields. No tachypnea. Psych: Alert and oriented. Normal mood and affect.  Health Maintenance Due  Topic Date Due   OPHTHALMOLOGY EXAM  04/25/2021   INFLUENZA VACCINE  09/25/2021   Lab Results Component Ref Range & Units 13:34 (10/02/21)  Color, UA  dark yellow   Clarity, UA  cloudy   Glucose, UA Negative Negative   Bilirubin, UA  negative   Ketones, UA  negative   Spec Grav, UA 1.010 - 1.025 1.020   Blood, UA  negative   pH, UA 5.0 - 8.0 6.0   Protein, UA Negative Negative   Urobilinogen, UA 0.2 or 1.0 E.U./dL negative Abnormal    Comment: 0.2 mg/dL  Nitrite, UA  negative   Leukocytes, UA Negative Moderate (2+) Abnormal    Comment: 125 Leu/uL  Appearance        Assessment & Plan:   1. Pyuria Ms. Dalporto is having dysuria and does have a positive leukocyte esterase in her urine. Azithromycin would not be the best antibiotic for urinary pathogens. I will go ahead and empirically treat her with a 3-day course of Septra. I will check a urine culture, though the current antibiotic may complicate the presentation.  - POCT Urinalysis Dipstick - sulfamethoxazole-trimethoprim (BACTRIM DS) 800-160 MG tablet; Take 1 tablet by mouth 2 (two) times daily.  Dispense: 6 tablet; Refill: 0 - Urine Culture  2. Moderate persistent asthma with acute exacerbation MS. Bruss is having an acute exacerbation of her asthma. In light of her prednisone allergy, I will provide her with an injection of Solu-Medrol instead. I will have her return next week for reassessment.  - methylPREDNISolone sodium succinate (SOLU-MEDROL) 125 mg/2 mL injection 125  mg  Return in about 1 week (around 10/09/2021) for Reassessment.   Lillette Boxer  Gena Fray, MD

## 2021-10-03 LAB — URINE CULTURE
MICRO NUMBER:: 13750592
Result:: NO GROWTH
SPECIMEN QUALITY:: ADEQUATE

## 2021-10-11 ENCOUNTER — Encounter: Payer: Self-pay | Admitting: Family Medicine

## 2021-10-11 ENCOUNTER — Ambulatory Visit: Payer: Medicare Other | Admitting: Family Medicine

## 2021-10-11 ENCOUNTER — Telehealth: Payer: Self-pay | Admitting: Family Medicine

## 2021-10-11 NOTE — Telephone Encounter (Signed)
No show history: 01/10/21 no show, fee waived, letter sent 02/27/21 no show, $50 fee, final warning letter sent 07/04/21 same day cancel, car won't start, fee waived, no letter 10/11/2021 no show, pt has not called  Please advise if you would like to proceed with additional warning letter or dismissal.

## 2021-10-11 NOTE — Telephone Encounter (Signed)
Generated dismissal letter  

## 2021-10-11 NOTE — Telephone Encounter (Signed)
8.17.23 no show letter sent 

## 2021-10-16 ENCOUNTER — Encounter: Payer: Self-pay | Admitting: Family Medicine

## 2021-11-05 ENCOUNTER — Telehealth: Payer: Self-pay | Admitting: Cardiology

## 2021-11-05 NOTE — Telephone Encounter (Signed)
Pt c/o Shortness Of Breath: STAT if SOB developed within the last 24 hours or pt is noticeably SOB on the phone  1. Are you currently SOB (can you hear that pt is SOB on the phone)? No  2. How long have you been experiencing SOB?  Since last Thursday  3. Are you SOB when sitting or when up moving around?   Both sitting and moving around  4. Are you currently experiencing any other symptoms?   Dizziness and weak from chest down   Patient stated she had gone to the hospital on Saturday where they told her to cut the medication (metoprolol tartrate (LOPRESSOR) 25 MG tablet) back.  Patient is concerned about how much medication to take.

## 2021-11-05 NOTE — Telephone Encounter (Signed)
Spoke with pt. She stated that she has been experiencing dizziness, feeling like she was going to pass out, shortness of breath and chest pain that comes and goes. She went to the ER Saturday. She said they told her her blood work and EKG was fine. They told her to take Metoprolol 25mg  1 daily instead of BID until seeing Dr. . Her appt is 11-15-21. She said she improved some on Sunday but feels the same this morning. Her BP's have been low in the 90/50 range. She is wanting to know what she should do until her appt.

## 2021-11-06 NOTE — Telephone Encounter (Signed)
Called pt. She stated that she was feeling some better today. Per Dr. Vanetta Shawl note encouraged her to drink plenty of fluids, eat well and follow up at scheduled appt on 9-21.Encouraged her to call with any problems or concerns. Pt agreed and verbalized understanding.

## 2021-11-15 ENCOUNTER — Ambulatory Visit: Payer: Medicare Other | Attending: Cardiology | Admitting: Cardiology

## 2021-11-15 ENCOUNTER — Encounter: Payer: Self-pay | Admitting: Cardiology

## 2021-11-15 VITALS — BP 100/74 | HR 80 | Ht 64.0 in | Wt 137.0 lb

## 2021-11-15 DIAGNOSIS — E1059 Type 1 diabetes mellitus with other circulatory complications: Secondary | ICD-10-CM | POA: Diagnosis present

## 2021-11-15 DIAGNOSIS — E785 Hyperlipidemia, unspecified: Secondary | ICD-10-CM | POA: Diagnosis present

## 2021-11-15 DIAGNOSIS — F17200 Nicotine dependence, unspecified, uncomplicated: Secondary | ICD-10-CM | POA: Diagnosis present

## 2021-11-15 DIAGNOSIS — I25119 Atherosclerotic heart disease of native coronary artery with unspecified angina pectoris: Secondary | ICD-10-CM | POA: Diagnosis present

## 2021-11-15 NOTE — Patient Instructions (Signed)
Medication Instructions:  Your physician has recommended you make the following change in your medication:   Stop Lopressor (metoprolol tartrate)  Start Toprol XL (metoprolol succinate) 25 mg daily.  *If you need a refill on your cardiac medications before your next appointment, please call your pharmacy*   Lab Work: None ordered If you have labs (blood work) drawn today and your tests are completely normal, you will receive your results only by: [place] (if you have MyChart) OR A paper copy in the mail If you have any lab test that is abnormal or we need to change your treatment, we will call you to review the results.   Testing/Procedures: None ordered   Follow-Up: At [facility], you and your health needs are our priority.  As part of our continuing mission to provide you with exceptional heart care, we have created designated Provider Care Teams.  These Care Teams include your primary Cardiologist (physician) and Advanced Practice Providers (APPs -  Physician Assistants and Nurse Practitioners) who all work together to provide you with the care you need, when you need it.  We recommend signing up for the patient portal called "MyChart".  Sign up information is provided on this After Visit Summary.  MyChart is used to connect with patients for Virtual Visits (Telemedicine).  Patients are able to view lab/test results, encounter notes, upcoming appointments, etc.  Non-urgent messages can be sent to your provider as well.   To learn more about what you can do with MyChart, go to [email or web address].    Your next appointment:   3 month(s)  The format for your next appointment:   In Person  Provider:   Jenne Campus, MD   Other Instructions NA

## 2021-11-15 NOTE — Progress Notes (Signed)
Cardiology Office Note:    Date:  11/15/2021   ID:  Melody Myers, DOB 1980-11-09, MRN [id number]  PCP:  Haydee Salter, MD  Cardiologist:  Jenne Campus, MD    Referring MD: Haydee Salter, MD   Chief Complaint  Patient presents with   Follow-up  Doing well  History of Present Illness:    Melody Myers is a 41 y.o. female with past medical history significant for coronary artery disease.  In August 2020 she had PTCA and stenting of the obtuse marginal branch.  Also high 5 diabetes, smoking, dyslipidemia.  She comes today 2 months for follow-up.  She been complaining of having chest pain.  She did have a stress test done in March of this year she did quite well on the treadmill stress test was negative in spite of that she still have a lot of complain of having chest pain she said may be a little bit better.  I brought an issue of potentially doing cardiac catheterization she is to prefer medical therapy at least for now however I told her if pain will continue will be forced to do cardiac catheterization.  She does have intolerance to multiple medication she have a problem with ranolazine she cut down dose of metoprolol she said if she does have pain that pain typically happen in the morning.  Happen anytime any situations sometimes short sometimes long, overall she is very vague in description of the pain.  Past Medical History:  Diagnosis Date   Anxiety    Asthma    Bipolar 1 disorder ([place])    Bipolar depression ([place]) 03/20/2013   Carpal tunnel syndrome 02/04/2013   Formatting of this note might be different from the original. IMPRESSION: Order right CTI 40981- ASAP   Chest pain 12/24/2018   Chronic pain 03/20/2013   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   Formatting of this note might be different from the original. Repaired at [facility] in Sept 2016.  Pt states that all problems are still present.  If [facility] by [facility] for [facility]. She has f/u appt.  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 10/14/2018   Degenerative disc disease, lumbar 06/05/2015   Formatting of this note might be different from the original. L4-5 > L3-4, mild   Depression    patient reports bipolor   Depression    Diabetic neuropathy ([place])    DKA (diabetic ketoacidoses) 03/19/2013   History of adenomatous polyp of colon 04/17/2015   Formatting of this note might be different from the original. Per path 2016   Hyperlipidemia LDL goal <70 10/04/2018   Hypokalemia 11/15/2014   Ingrown toenail of both feet 04/12/2019   Formatting of this note might be different from the original. great toenails bilateral   Insulin dependent diabetes mellitus with complications    insulin dependant   Jaw pain 02/24/2020   Leukocytosis 03/20/2013   Myocardial infarction ([place]) 10/02/2018   Neuropathy    Non-ST elevation (NSTEMI) myocardial infarction ([place]) 10/02/2018   NSTEMI (non-ST elevated myocardial infarction) ([place]) 10/02/2018   Superficial incisional surgical site infection 11/14/2014   Syncopal episodes    Tobacco abuse 03/20/2013   Urinary retention 11/14/2014    Past Surgical History:  Procedure Laterality Date   CORONARY STENT INTERVENTION N/A 10/02/2018   Procedure: CORONARY STENT INTERVENTION;  Surgeon: Nelva Bush, MD;  Location: [facility];  Service: Cardiovascular;  Laterality: N/A;  LEFT HEART CATH AND CORONARY ANGIOGRAPHY N/A 10/02/2018   Procedure: LEFT HEART CATH AND CORONARY ANGIOGRAPHY;  Surgeon: Nelva Bush, MD;  Location: [facility];  Service: Cardiovascular;  Laterality: N/A;   TUBAL LIGATION     VAGINA RECONSTRUCTION SURGERY  10/2014   & ana, reconstruction    Current Medications: Current Meds  Medication Sig   acetaminophen (TYLENOL) 325 MG tablet Take 650 mg by mouth every 6 (six) hours as needed for moderate pain or headache.    amLODipine (NORVASC) 2.5 MG tablet Take 1 tablet (2.5 mg  total) by mouth daily.   aspirin EC 81 MG EC tablet Take 1 tablet (81 mg total) by mouth daily.   atorvastatin (LIPITOR) 80 MG tablet TAKE 1 TABLET BY MOUTH DAILY AT 6 PM.   azithromycin (ZITHROMAX) 250 MG tablet Take 250 mg by mouth as directed.   clopidogrel (PLAVIX) 75 MG tablet TAKE 1 TABLET BY MOUTH EVERY DAY   Continuous Blood Gluc Sensor (DEXCOM G6 SENSOR) MISC 1 Device by Does not apply route See admin instructions. Change every 10 days   Continuous Blood Gluc Transmit (DEXCOM G6 TRANSMITTER) MISC 1 Device by Does not apply route every 3 (three) months.   fluticasone (FLONASE) 50 MCG/ACT nasal spray Place 2 sprays into both nostrils daily. 2 sprays each nostril at night   Fluticasone-Umeclidin-Vilant (TRELEGY ELLIPTA) 100-62.5-25 MCG/ACT AEPB Inhale 1 puff into the lungs daily.   Glucagon, rDNA, (GLUCAGON EMERGENCY) 1 MG KIT Inject 1 mg as directed as needed. for low blood sugar episodes (Patient taking differently: Inject 1 mg as directed as needed (Low Blood Sugar). for low blood sugar episodes)   insulin aspart (NOVOLOG) 100 UNIT/ML injection 3 times a day (just before each meal) 10-10-6 units (Patient taking differently: Inject 6-10 Units into the skin 3 (three) times daily with meals. 3 times a day (just before each meal) 10-10-6 units)   Insulin Disposable Pump (OMNIPOD 5 G6 INTRO, GEN 5,) KIT 1 each by Does not apply route as needed.   Insulin Disposable Pump (OMNIPOD 5 G6 POD, GEN 5,) MISC 1 each by Does not apply route every 3 (three) days.   levalbuterol (XOPENEX) 1.25 MG/3ML nebulizer solution USE 1 VIAL IN NEBULIZER EVERY 6 HOURS (Patient taking differently: Take 1.25 mg by nebulization every 6 (six) hours as needed for wheezing or shortness of breath.)   loperamide (IMODIUM) 2 MG capsule Take 2 mg by mouth as needed for diarrhea or loose stools.   metoprolol tartrate (LOPRESSOR) 25 MG tablet Take 25 mg by mouth daily.   montelukast (SINGULAIR) 10 MG tablet Take 1 tablet (10 mg  total) by mouth at bedtime.   Multiple Vitamin (MULTIVITAMIN WITH MINERALS) TABS Take 1 tablet by mouth daily. Unknown strength   nitroGLYCERIN (NITROSTAT) 0.4 MG SL tablet PLACE 1 TABLET UNDER THE TONGUE EVERY 5 MINUTES X 3 DOSES AS NEEDED FOR CHEST PAIN. MAX3TAB/15MIN   ondansetron (ZOFRAN) 4 MG tablet Take 1 tablet (4 mg total) by mouth every 8 (eight) hours as needed for nausea or vomiting.   pantoprazole (PROTONIX) 40 MG tablet Take 1 tablet (40 mg total) by mouth daily.   rizatriptan (MAXALT) 5 MG tablet Take 1 tablet (5 mg total) by mouth as needed for migraine. May repeat in 2 hours if needed   triamcinolone cream (KENALOG) 0.1 % Apply 1 application topically 2 (two) times daily.   VENTOLIN HFA 108 (90 Base) MCG/ACT inhaler Inhale 2 puffs into the lungs every 4 (four)  hours as needed for wheezing or shortness of breath.     Allergies:   Amoxicillin, Penicillins, and Prednisone   Social History   Socioeconomic History   Marital status: Widowed    Spouse name: Not on file   Number of children: 2   Years of education: Not on file   Highest education level: Not on file  Occupational History   Occupation: Disabled  Tobacco Use   Smoking status: Every Day    Packs/day: 0.25    Types: Cigarettes   Smokeless tobacco: Never   Tobacco comments:    2-3 cigarettes per day  Vaping Use   Vaping Use: Never used  Substance and Sexual Activity   Alcohol use: Not Currently    Comment: occasional   Drug use: No   Sexual activity: Yes    Comment: tubal ligation  Other Topics Concern   Not on file  Social History Narrative   Not on file   Social Determinants of Health   Financial Resource Strain: High Risk (10/02/2018)   Overall Financial Resource Strain (CARDIA)    Difficulty of Paying Living Expenses: Hard  Food Insecurity: Food Insecurity Present (10/02/2018)   Hunger Vital Sign    Worried About Running Out of Food in the Last Year: Often true    Ran Out of Food in the Last Year:  Sometimes true  Transportation Needs: Unmet Transportation Needs (10/02/2018)   PRAPARE - Hydrologist (Medical): Yes    Lack of Transportation (Non-Medical): Yes  Physical Activity: Insufficiently Active (10/02/2018)   Exercise Vital Sign    Days of Exercise per Week: 1 day    Minutes of Exercise per Session: 30 min  Stress: Stress Concern Present (10/02/2018)   [employer] [place]    Feeling of Stress : Very much  Social Connections: Socially Isolated (10/02/2018)   Social Connection and Isolation Panel [NHANES]    Frequency of Communication with Friends and Family: More than three times a week    Frequency of Social Gatherings with Friends and Family: More than three times a week    Attends Religious Services: Never    Marine scientist or Organizations: No    Attends Archivist Meetings: Never    Marital Status: Widowed     Family History: The patient's family history includes COPD in her paternal grandmother; Diabetes in her maternal grandmother and sister; Heart attack (age of onset: 70) in her brother; Heart attack (age of onset: 42) in her father; Heart disease in her brother. ROS:   Please see the history of present illness.    All 14 point review of systems negative except as described per history of present illness  EKGs/Labs/Other Studies Reviewed:      Recent Labs: 01/05/2021: B Natriuretic Peptide 447.3 08/23/2021: BUN 7; Creatinine, Ser 0.64; Hemoglobin 14.5; Platelets 237; Potassium 3.9; Sodium 138  Recent Lipid Panel    Component Value Date/Time   CHOL 106 08/06/2021 0844   CHOL 100 06/19/2020 0844   TRIG 101.0 08/06/2021 0844   HDL 39.40 08/06/2021 0844   HDL 40 06/19/2020 0844   CHOLHDL 3 08/06/2021 0844   VLDL 20.2 08/06/2021 0844   LDLCALC 46 08/06/2021 0844   LDLCALC 46 06/19/2020 0844    Physical Exam:    VS:  BP 100/74 (BP Location: Right Arm, Patient  Position: Sitting, Cuff Size: Normal)   Pulse 80   Ht '5\' 4"'  (1.626  m)   Wt 137 lb (62.1 kg)   SpO2 97%   BMI 23.52 kg/m     Wt Readings from Last 3 Encounters:  11/15/21 137 lb (62.1 kg)  10/02/21 141 lb 3.2 oz (64 kg)  09/21/21 137 lb 9.6 oz (62.4 kg)     GEN:  Well nourished, well developed in no acute distress HEENT: Normal NECK: No JVD; No carotid bruits LYMPHATICS: No lymphadenopathy CARDIAC: RRR, no murmurs, no rubs, no gallops RESPIRATORY:  Clear to auscultation without rales, wheezing or rhonchi  ABDOMEN: Soft, non-tender, non-distended MUSCULOSKELETAL:  No edema; No deformity  SKIN: Warm and dry LOWER EXTREMITIES: no swelling NEUROLOGIC:  Alert and oriented x 3 PSYCHIATRIC:  Normal affect   ASSESSMENT:    1. Coronary artery disease involving native coronary artery of native heart with angina pectoris ([place])   2. Type 1 diabetes mellitus with other circulatory complication (HCC)   3. Hyperlipidemia LDL goal <70   4. Tobacco use disorder    PLAN:    In order of problems listed above:  Coronary artery disease known with PTCA and stenting of obtuse marginal branch done in 2020.  Still have some pain I decided to put her on long-acting metoprolol hopefully that will prevent her from having episode of pain.  She also told me that she got a dog and she started walking with the dog.  I will see how it goes and I told her also if pain continue we will consider cardiac catheterization cardiac catheterization will be considered Type 2 diabetes she admits that her sugars all over the place I stressed importance of taking care of diabetes the best we can Dyslipidemia did review her K PN which show me her LDL of 46 and HDL 39.  We will continue present management which include high intensity statin she is also on dual antiplatelet therapy because of advanced early disease Tobacco abuse.  Sadly continues to smoke I told her she must quit she understands   Medication  Adjustments/Labs and Tests Ordered: Current medicines are reviewed at length with the patient today.  Concerns regarding medicines are outlined above.  No orders of the defined types were placed in this encounter.  Medication changes: No orders of the defined types were placed in this encounter.   Signed, Park Liter, MD, [facility] 11/15/2021 9:13 AM    [place]

## 2021-11-15 NOTE — Addendum Note (Signed)
Addended by: Truddie Hidden on: 11/15/2021 09:26 AM   Modules accepted: Orders

## 2021-11-17 ENCOUNTER — Encounter: Payer: Self-pay | Admitting: Cardiology

## 2021-11-19 MED ORDER — METOPROLOL SUCCINATE ER 25 MG PO TB24: 25.0000 mg | ORAL_TABLET | Freq: Every day | ORAL | 3 refills | Status: DC

## 2021-11-19 NOTE — Telephone Encounter (Signed)
Pt sent a MyChart message requesting cardiac catheterization appointment to go ahead and check the blockage.  11/15/21 note: PLAN:     In order of problems listed above:   Coronary artery disease known with PTCA and stenting of obtuse marginal branch done in 2020.  Still have some pain I decided to put her on long-acting metoprolol hopefully that will prevent her from having episode of pain.  She also told me that she got a dog and she started walking with the dog.  I will see how it goes and I told her also if pain continue we will consider cardiac catheterization cardiac catheterization will be considered Type 2 diabetes she admits that her sugars all over the place I stressed importance of taking care of diabetes the best we can Dyslipidemia did review her K PN which show me her LDL of 46 and HDL 39.  We will continue present management which include high intensity statin she is also on dual antiplatelet therapy because of advanced early disease Tobacco abuse.  Sadly continues to smoke I told her she must quit she understands

## 2021-11-19 NOTE — Addendum Note (Signed)
Addended by: Truddie Hidden on: 11/19/2021 07:32 AM   Modules accepted: Orders

## 2021-11-29 ENCOUNTER — Other Ambulatory Visit: Payer: Self-pay | Admitting: *Deleted

## 2021-11-29 DIAGNOSIS — J4540 Moderate persistent asthma, uncomplicated: Secondary | ICD-10-CM

## 2021-11-29 MED ORDER — TRELEGY ELLIPTA 100-62.5-25 MCG/ACT IN AEPB: 1.0000 | INHALATION_SPRAY | Freq: Every day | RESPIRATORY_TRACT | 6 refills | Status: DC

## 2021-12-03 ENCOUNTER — Encounter: Payer: Self-pay | Admitting: Dietician

## 2021-12-03 ENCOUNTER — Encounter: Payer: Medicare Other | Attending: Internal Medicine | Admitting: Dietician

## 2021-12-03 ENCOUNTER — Other Ambulatory Visit: Payer: Self-pay | Admitting: Internal Medicine

## 2021-12-03 ENCOUNTER — Telehealth: Payer: Self-pay | Admitting: Dietician

## 2021-12-03 DIAGNOSIS — E1040 Type 1 diabetes mellitus with diabetic neuropathy, unspecified: Secondary | ICD-10-CM | POA: Diagnosis not present

## 2021-12-03 MED ORDER — DEXCOM G7 RECEIVER DEVI: 1.0000 | Freq: Once | 0 refills | Status: AC

## 2021-12-03 MED ORDER — DEXCOM G7 SENSOR MISC: 3.0000 | 4 refills | Status: DC

## 2021-12-03 NOTE — Patient Instructions (Signed)
Use the t:slim pump consistently Continue to check your blood glucose at least 4 times daily. Bolus for each meal (enter blood glucose and carbs) Avoid skipping meals. Stress control

## 2021-12-03 NOTE — Telephone Encounter (Signed)
Called patient to inform her that the [place] CGM and receiver have been order. Sent a message to the medical assistant to have Novolog prescription refilled.  Instructed patient to make the following pump setting changes:              Basal Rate - 0.8 >> 1.1 (and may need to increase further if sugars are still high in the morning after this)              I:C - 1:20 >> 1:15 with b'fast  and lunch, but continue  1:20 with dinner              Correction factor 1:100 >> 1:50              Target blood glucose 110              Insulin Duration 5 hours   Unable to instruct her on how to do this over the phone. She will call [employer] tomorrow am or see me in my office tomorrow afternoon to make the changes in her pump.  Antonieta Iba, RD, LDN, CDCES

## 2021-12-03 NOTE — Progress Notes (Signed)
Diabetes Self-Management Education  Visit Type: First/Initial  Appt. Start Time: 0930 Appt. End Time: 1010  12/03/2021  [name], identified by name and date of birth, is a 41 y.o. female with a diagnosis of Diabetes: Type 1.   ASSESSMENT Patient is here today alone.  She frequently sees another diabetes educator in the office. She has concerns that her pump is not giving her enough insulin. Just started using her t:slim again yesterday.  Had been on the Omnipod 5 but did not like it and stopped using it and has switched back to the t:slim She is checking her blood glucose 4-6 times daily and entering glucose and carbohydrates into her pump.   Called patient to obtain records from the past week.  She is concerned that her glucose is frequently high in the am. Fasting glucose of 247  (247-550)  Before lunch 250-350 Before dinner 211-290 Before bed 125-180 Reports only 1 incident of low blood glucose int he past 2 weeks due to not eating all day due to high glucose and then over bolusing. She is giving additional injection Novolog to try to lower her blood glucose.  When she is not using the pump she will give Basaglar and Novolog injections.  She is not specific regarding amounts and history is difficult to obtain related to this as she stated that she was taking the Basaglar to lower her blood glucose as well. Reports giving 9-12 injections of insulin per day when not using the insulin pump. She is not using a CGM.  She states that has no compatible phone for the [facility] and is currently unable to get one. She states the Dexcom G6 receiver was delivered to the wrong address and the company will not deliver another receiver.   She would like a prescription for a Dexcom G7 and receiver.  She also knows that the [facility] G7 is currently not compatible with the t:slim but is expected to be compatible soon. Compatiblity with t:slim would only be of concern if she had a cell phone which  was compatible with the Dexcom G7. She requests a refill prescription for Novolog.  Will message MD regarding more detailed blood glucose, and patient request for Novolog refill and a prescription for Dexcom G7 with receiver.   History includes:  Type 1 diabetes (2001), HLD, neuropathy, MI, bipolar, smoking Medications include:  Basaglar, Novolog Labs include:  A1C 7.6% 08/06/2021 decreased from 8.6% 04/05/2021, GFR 109 07/2021 Insulin Pump:  t:slim  Settings obtained from pump:  Basal Rate - 0.8  I:C - 1:20  Correction factor 1:100  Target blood glucose 110  Insulin Duration 5 hours CGM:  none  Patient lives with her family.  She states that they are trained on treatment of low blood glucose and glucagon administration.    Diabetes Self-Management Education - 12/03/21 1349       Visit Information   Visit Type First/Initial      Initial Visit   Diabetes Type Type 1    Date Diagnosed 2001    Are you currently following a meal plan? No    Are you taking your medications as prescribed? No      Psychosocial Assessment   Patient Belief/Attitude about Diabetes Motivated to manage diabetes    Self-care barriers Lack of material resources    Self-management support Doctor's office;CDE visits    Other persons present Patient    Patient Concerns Glycemic Control    Special Needs Unable to determine  Preferred Learning Style No preference indicated    Learning Readiness Ready    How often do you need to have someone help you when you read instructions, pamphlets, or other written materials from your doctor or pharmacy? 1 - Never    What is the last grade level you completed in school? 10 GED      Pre-Education Assessment   Patient understands the diabetes disease and treatment process. Comprehends key points    Patient understands incorporating nutritional management into lifestyle. Comprehends key points    Patient undertands incorporating physical activity into lifestyle.  Comprehends key points    Patient understands using medications safely. Needs Review    Patient understands monitoring blood glucose, interpreting and using results Needs Review    Patient understands prevention, detection, and treatment of acute complications. Comprehends key points    Patient understands prevention, detection, and treatment of chronic complications. Compreheands key points    Patient understands how to develop strategies to address psychosocial issues. Comprehends key points    Patient understands how to develop strategies to promote health/change behavior. Needs Review      Complications   Last HgB A1C per patient/outside source 7.6 %   08/06/2021 decreased from 8.6% 04/05/2021   How often do you check your blood sugar? > 4 times/day    Fasting Blood glucose range (mg/dL) >200    Postprandial Blood glucose range (mg/dL) 130-179;180-200    Number of hypoglycemic episodes per month 1    Can you tell when your blood sugar is low? Yes    What do you do if your blood sugar is low? drinks juice    Number of hyperglycemic episodes ( >200mg /dL): Daily    Can you tell when your blood sugar is high? Yes    What do you do if your blood sugar is high? bolus    Have you had a dilated eye exam in the past 12 months? No    Have you had a dental exam in the past 12 months? No    Are you checking your feet? Yes    How many days per week are you checking your feet? 7      Dietary Intake   Breakfast biscuit or banana or cereal and milk or eggs and sausage    Snack (morning) none    Lunch unknown    Snack (afternoon) celery or nuts or if glucose is less than 100    Dinner unknown    Snack (evening) celery or nut or if glucose is less than 100    Beverage(s) unknown      Activity / Exercise   Activity / Exercise Type Light (walking / raking leaves)    How many days per week do you exercise? 1    How many minutes per day do you exercise? 20    Total minutes per week of exercise 20       Patient Education   Previous Diabetes Education Yes (please comment)   frequent   Healthy Eating Carbohydrate counting    Medications Reviewed patients medication for diabetes, action, purpose, timing of dose and side effects.    Monitoring --   discussed blood glucose readings, discussed CGM options   Acute complications Other (comment)   discussed patient's current treatment of low blood glucose and high blood glucose   Diabetes Stress and Support Identified and addressed patients feelings and concerns about diabetes;Role of stress on diabetes      Individualized Goals (developed  by patient)   Nutrition Carb counting    Medications take my medication as prescribed    Monitoring  Consistenly use CGM;Test my blood glucose as discussed    Problem Solving Eating Pattern;Addressing barriers to behavior change    Reducing Risk examine blood glucose patterns    Health Coping Ask for help with psychological, social, or emotional issues      Post-Education Assessment   Patient understands the diabetes disease and treatment process. Demonstrates understanding / competency    Patient understands incorporating nutritional management into lifestyle. Comprehends key points    Patient undertands incorporating physical activity into lifestyle. Demonstrates understanding / competency    Patient understands using medications safely. Demonstrates understanding / competency    Patient understands monitoring blood glucose, interpreting and using results Demonstrates understanding / competency    Patient understands prevention, detection, and treatment of acute complications. Demonstrates understanding / competency    Patient understands prevention, detection, and treatment of chronic complications. Demonstrates understanding / competency    Patient understands how to develop strategies to address psychosocial issues. Demonstrates understanding / competency    Patient understands how to develop strategies to  promote health/change behavior. Comprehends key points      Outcomes   Expected Outcomes Demonstrated interest in learning. Expect positive outcomes    Future DMSE 4-6 wks    Program Status Not Completed             Individualized Plan for Diabetes Self-Management Training:   Learning Objective:  Patient will have a greater understanding of diabetes self-management. Patient education plan is to attend individual and/or group sessions per assessed needs and concerns.   Plan:   There are no Patient Instructions on file for this visit.  Expected Outcomes:  Demonstrated interest in learning. Expect positive outcomes  Education material provided: Meal plan card  If problems or questions, patient to contact team via:  Phone  Future DSME appointment: 4-6 wks

## 2021-12-04 ENCOUNTER — Telehealth: Payer: Self-pay | Admitting: Pulmonary Disease

## 2021-12-04 ENCOUNTER — Encounter: Payer: Self-pay | Admitting: Cardiology

## 2021-12-04 ENCOUNTER — Telehealth: Payer: Self-pay | Admitting: Dietician

## 2021-12-04 ENCOUNTER — Other Ambulatory Visit: Payer: Self-pay

## 2021-12-04 DIAGNOSIS — E1059 Type 1 diabetes mellitus with other circulatory complications: Secondary | ICD-10-CM

## 2021-12-04 MED ORDER — INSULIN ASPART 100 UNIT/ML IJ SOLN: 6.0000 [IU] | Freq: Three times a day (TID) | INTRAMUSCULAR | 0 refills | Status: DC

## 2021-12-04 NOTE — Telephone Encounter (Signed)
Patient is returning phone call. Patient phone number is [phone].

## 2021-12-04 NOTE — Telephone Encounter (Signed)
Melody Myers, RD  Lauralyn Primes, RMA Patient needs a refill of Novolog in a vial sent to [facility].   Thanks,  [name]  Rx sent to preferred pharmacy.

## 2021-12-04 NOTE — Telephone Encounter (Signed)
Called patient to see if she was able to make the updates to the settings in her T:slim pump. She said she was able to change the basal rate to 1.1 but was not able to make the other setting changes.  She stated that the pump would not let her.  Instructed her to call [employer] for assistance.  If she is not able to do this, instructed her to set up an appointment with me for the updates.  Antonieta Iba, RD, LDN, CDCES

## 2021-12-04 NOTE — Telephone Encounter (Signed)
Called patient back but she did not answer. Will attempt again later.

## 2021-12-04 NOTE — Telephone Encounter (Signed)
Spoke with the patient who states that her chest pain has gotten worse over the past couple of days. It has become more constant and worse with exertion. No radiation of pain. She states she does have shortness of breath. She took nitroglycerin on two different occassions with relief of pain, however it eventually returned. Patient is currently having chest pain. It was recommended by Dr. Agustin Cree that she have a heart catheterization if her chest pain did not improve. I have recommended that the patient go to the ER for evaluation due to worsening symptoms and current active chest pain. Patient agrees with this plan.

## 2021-12-04 NOTE — Telephone Encounter (Signed)
Called patient but she did not answer. Left message for her to call back.  

## 2021-12-07 MED ORDER — ALBUTEROL SULFATE HFA 108 (90 BASE) MCG/ACT IN AERS: 2.0000 | INHALATION_SPRAY | Freq: Four times a day (QID) | RESPIRATORY_TRACT | 1 refills | Status: AC | PRN

## 2021-12-07 NOTE — Telephone Encounter (Signed)
Spoke with the pt  She is asking if she is supposed to be on singulair and albuterol inh along with the trelegy  I advised yes, per last visit  Refilled albuterol inhaler  She is scheduled for appt with JD on 12/18/21  Nothing further needed

## 2021-12-11 ENCOUNTER — Other Ambulatory Visit (HOSPITAL_COMMUNITY): Payer: Self-pay

## 2021-12-12 ENCOUNTER — Other Ambulatory Visit (HOSPITAL_COMMUNITY): Payer: Self-pay

## 2021-12-13 ENCOUNTER — Telehealth: Payer: Self-pay

## 2021-12-13 ENCOUNTER — Other Ambulatory Visit (HOSPITAL_COMMUNITY): Payer: Self-pay

## 2021-12-13 DIAGNOSIS — E1059 Type 1 diabetes mellitus with other circulatory complications: Secondary | ICD-10-CM

## 2021-12-13 MED ORDER — DEXCOM G7 SENSOR MISC: 3.0000 | 4 refills | Status: DC

## 2021-12-13 NOTE — Addendum Note (Signed)
Addended by: Lauralyn Primes on: 12/13/2021 02:22 PM   Modules accepted: Orders

## 2021-12-13 NOTE — Telephone Encounter (Signed)
Patient Advocate Encounter  Received prior authorization request for Dexcom G7 Sensor.    Per Test Claim: Sensors may be covered under part B.  Per CMM: The patient currently has access to the requested medication and a Prior Authorization is not needed for the patient/medication.

## 2021-12-13 NOTE — Telephone Encounter (Signed)
T,  Can you submit this with dx. Code E10.59? Ty! C

## 2021-12-13 NOTE — Telephone Encounter (Signed)
Rx sent with dx code included.

## 2021-12-18 ENCOUNTER — Emergency Department (HOSPITAL_COMMUNITY): Payer: Medicare Other

## 2021-12-18 ENCOUNTER — Other Ambulatory Visit (HOSPITAL_COMMUNITY): Payer: Self-pay

## 2021-12-18 ENCOUNTER — Encounter (HOSPITAL_COMMUNITY): Payer: Self-pay | Admitting: Emergency Medicine

## 2021-12-18 ENCOUNTER — Emergency Department (HOSPITAL_COMMUNITY)
Admission: EM | Admit: 2021-12-18 | Discharge: 2021-12-18 | Discharge status: Against Medical Advice | Payer: Medicare Other | Attending: Emergency Medicine | Admitting: Emergency Medicine

## 2021-12-18 DIAGNOSIS — Z5321 Procedure and treatment not carried out due to patient leaving prior to being seen by health care provider: Secondary | ICD-10-CM | POA: Diagnosis not present

## 2021-12-18 DIAGNOSIS — R079 Chest pain, unspecified: Secondary | ICD-10-CM | POA: Insufficient documentation

## 2021-12-18 DIAGNOSIS — R002 Palpitations: Secondary | ICD-10-CM | POA: Diagnosis not present

## 2021-12-18 LAB — CBC
HCT: 42.2 % (ref 36.0–46.0)
Hemoglobin: 14.3 g/dL (ref 12.0–15.0)
MCH: 30.4 pg (ref 26.0–34.0)
MCHC: 33.9 g/dL (ref 30.0–36.0)
MCV: 89.6 fL (ref 80.0–100.0)
Platelets: 205 10*3/uL (ref 150–400)
RBC: 4.71 MIL/uL (ref 3.87–5.11)
RDW: 12.5 % (ref 11.5–15.5)
WBC: 7.9 10*3/uL (ref 4.0–10.5)
nRBC: 0 % (ref 0.0–0.2)

## 2021-12-18 LAB — BASIC METABOLIC PANEL
Anion gap: 9 (ref 5–15)
BUN: 8 mg/dL (ref 6–20)
CO2: 23 mmol/L (ref 22–32)
Calcium: 9 mg/dL (ref 8.9–10.3)
Chloride: 105 mmol/L (ref 98–111)
Creatinine, Ser: 0.64 mg/dL (ref 0.44–1.00)
GFR, Estimated: >60 mL/min (ref >60)
Glucose, Bld: 268 mg/dL — ABNORMAL HIGH (ref 70–99)
Potassium: 4.6 mmol/L (ref 3.5–5.1)
Sodium: 137 mmol/L (ref 135–145)

## 2021-12-18 LAB — I-STAT BETA HCG BLOOD, ED (MC, WL, AP ONLY): I-stat hCG, quantitative: <5 m[IU]/mL (ref <5)

## 2021-12-18 LAB — TROPONIN I (HIGH SENSITIVITY): Troponin I (High Sensitivity): 2 ng/L (ref <18)

## 2021-12-18 NOTE — ED Notes (Signed)
PT decided to leave AMA needed to pick up daughter. PT had IV inserted in left arm by EMS. IV was removed at 13:56.

## 2021-12-18 NOTE — ED Triage Notes (Signed)
Patient BIB [facility] from physician office, complains of chest palpitations that started this morning when taking his children to school. History of NSTEMI in 2020. 20g saline lock in left AC. Patient self administered  1x SL NTG at approximate 1130, EMS gave 324mg  ASA PTA, 530mL NS. Patient is alert, oriented, and in no apparent distress at this time.  HR 90 100% on room air BP 99/58

## 2021-12-18 NOTE — ED Provider Triage Note (Signed)
Emergency Medicine Provider Triage Evaluation Note  Malary Aylesworth , a 41 y.o. female  was evaluated in triage.  Pt complains of palpitations and chest pain that started earlier this morning.  Patient states chest pain has been present for the past 2 days.  Patient has a history of NSTEMI however, notes this feels different.  She was given ASA and nitroglycerin prior to arrival with some improvement in chest pain.  She admits to some mild chest pain now.  Review of Systems  Positive: CP, palpitations Negative: fever  Physical Exam  BP 107/68 (BP Location: Right Arm)   Pulse 89   Temp 98.2 F (36.8 C) (Oral)   Resp 16   SpO2 98%  Gen:   Awake, no distress   Resp:  Normal effort  MSK:   Moves extremities without difficulty  Other:    Medical Decision Making  Medically screening exam initiated at 12:54 PM.  Appropriate orders placed.  Nakea Gouger was informed that the remainder of the evaluation will be completed by another provider, this initial triage assessment does not replace that evaluation, and the importance of remaining in the ED until their evaluation is complete.  Cardiac labs   Karie Kirks 12/18/21 1256

## 2021-12-19 ENCOUNTER — Ambulatory Visit: Payer: Medicare Other | Admitting: Pulmonary Disease

## 2021-12-22 IMAGING — CT CT HEAD W/O CM
4 series · 16 of 47 positions shown, 18 images · non-contrast
Comparison: None.

CLINICAL DATA: Trauma.

EXAM:
CT HEAD WITHOUT CONTRAST
CT CERVICAL SPINE WITHOUT CONTRAST
TECHNIQUE: Multidetector CT imaging of the head and cervical spine was
performed following the standard protocol without intravenous
contrast. Multiplanar CT image reconstructions of the cervical spine
were also generated.

[Series 3: head without · axial · non-contrast · 0.42mm/px · z∈[-70,+45]mm · 7 of 31 slices shown, 9 images]
[im 4/31  brain]
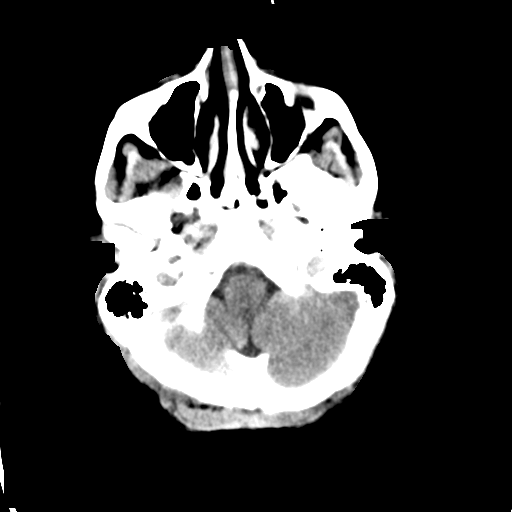
[im 4/31  bone]
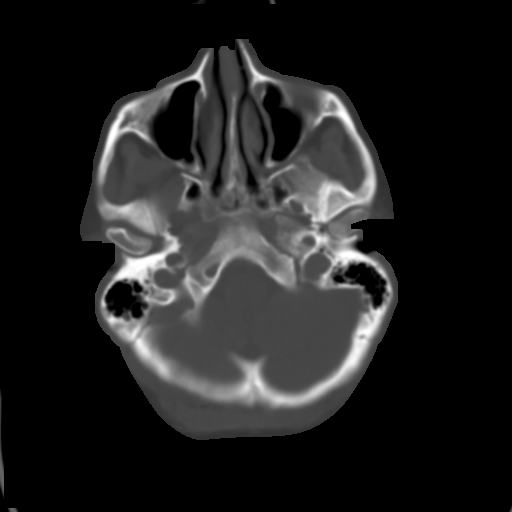
[im 8/31  brain]
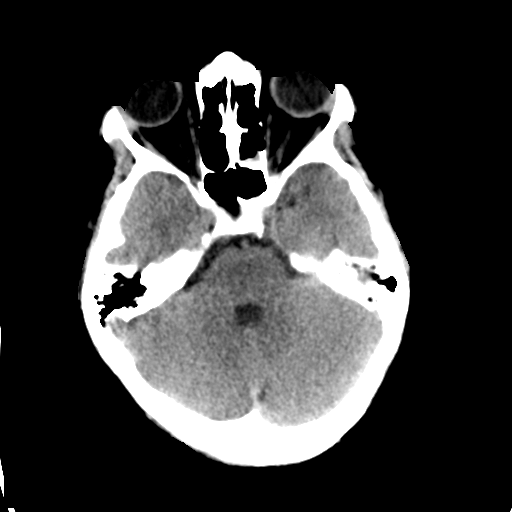
[im 12/31  brain]
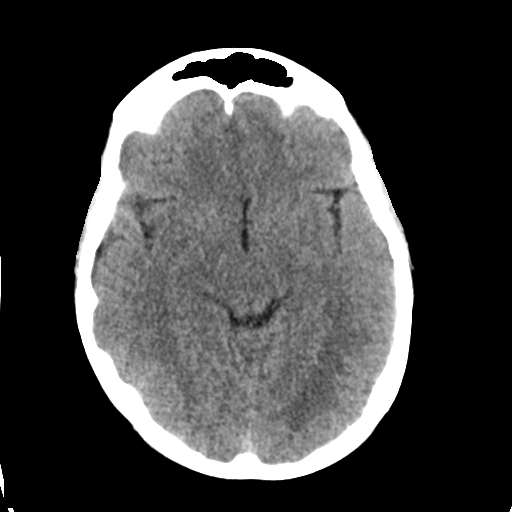
[im 16/31  brain]
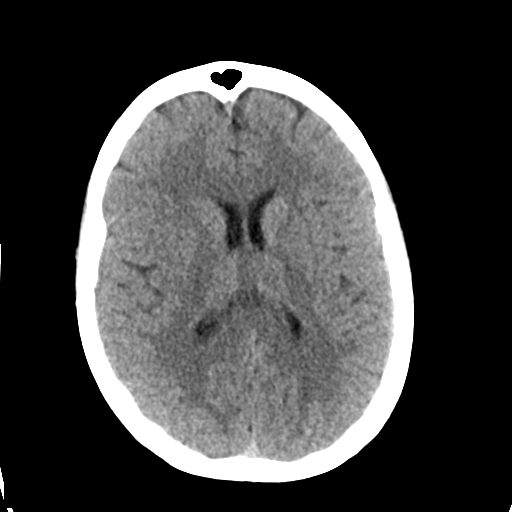
[im 19/31  brain]
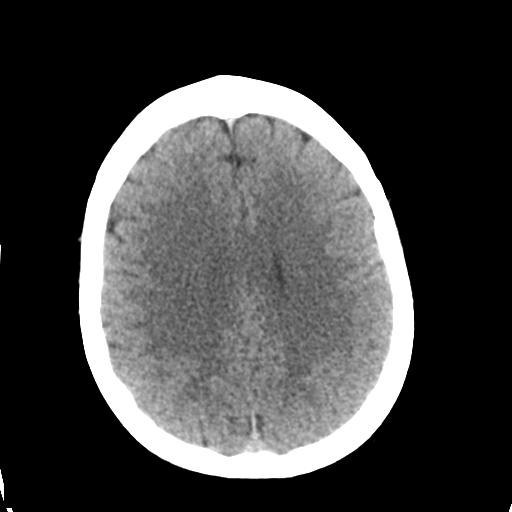
[im 19/31  bone]
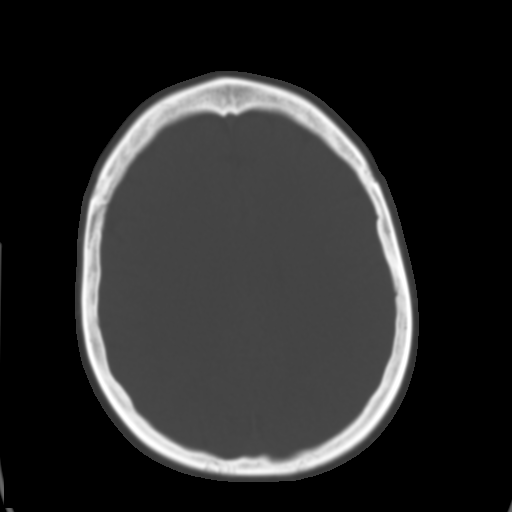
[im 23/31  brain]
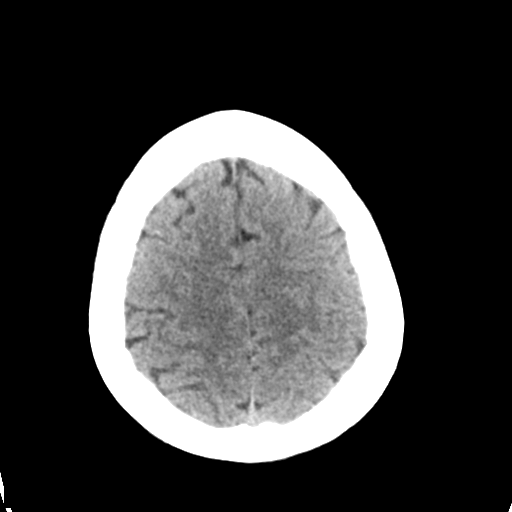
[im 27/31  brain]
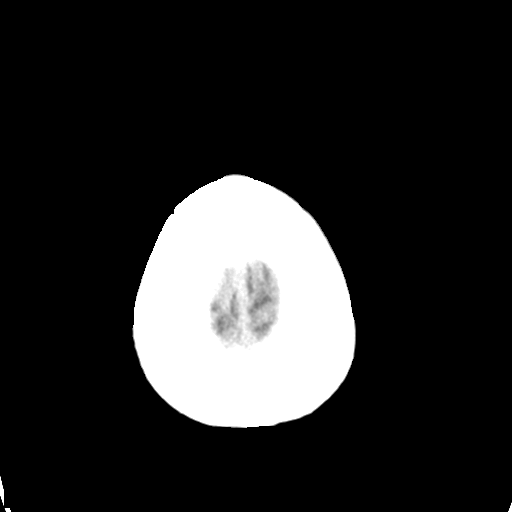

[Series 4: head bone · axial · 0.42mm/px · z∈[-71,-41]mm · 3 of 76 slices shown]
[im 8/76  bone]
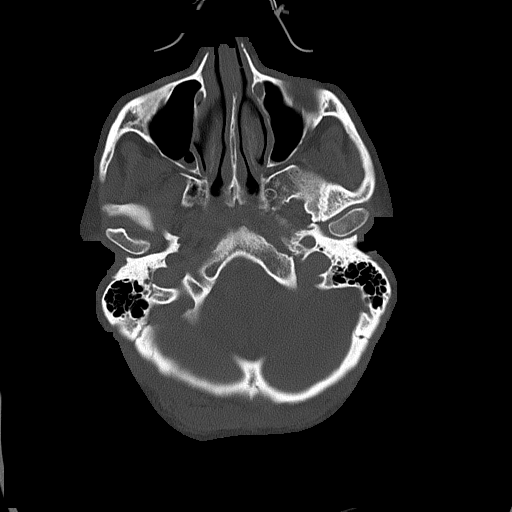
[im 16/76  bone]
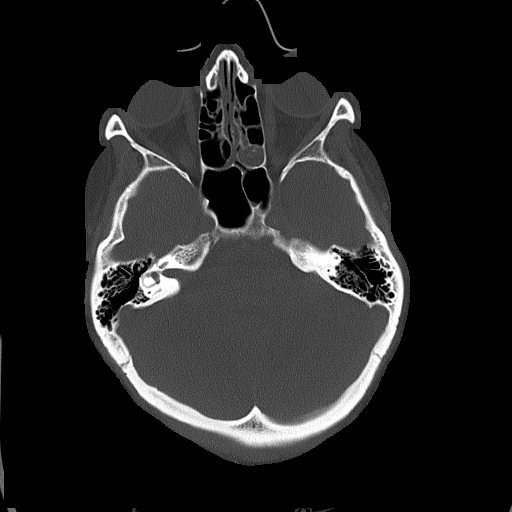
[im 23/76  bone]
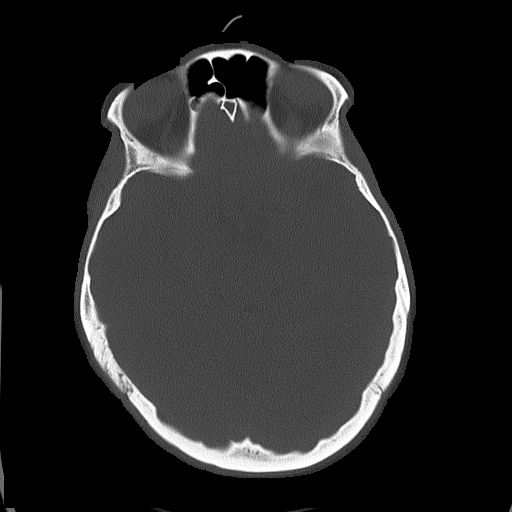

[Series 5: head without cor · coronal · non-contrast · 0.31mm/px · 3 of 67 slices shown]
[im 23/67  brain]
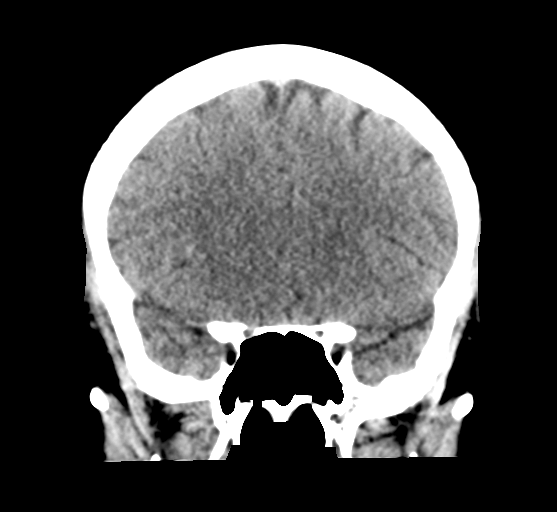
[im 30/67  brain]
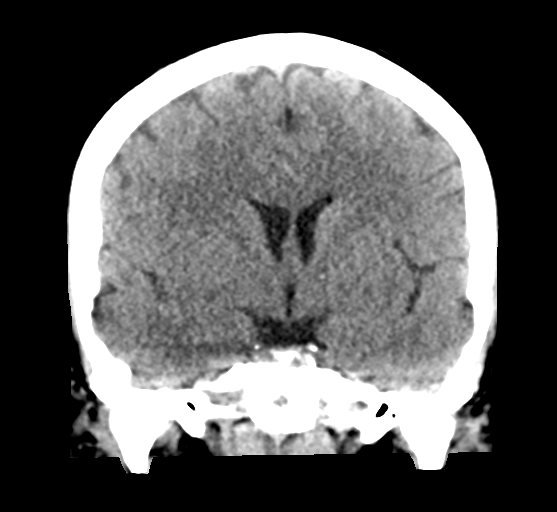
[im 37/67  brain]
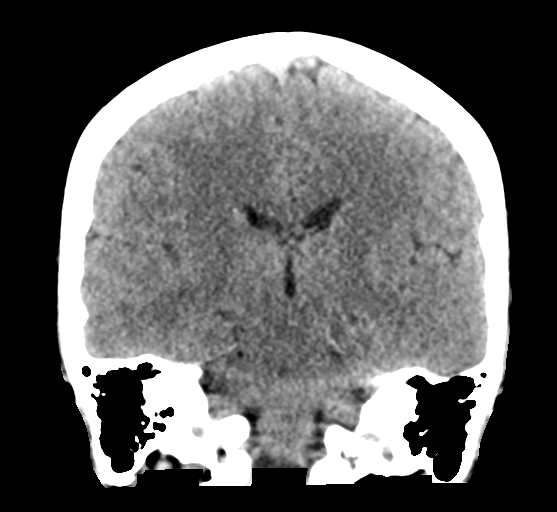

[Series 6: head without sag · sagittal · non-contrast · 0.31mm/px · 3 of 59 slices shown]
[im 20/59  brain]
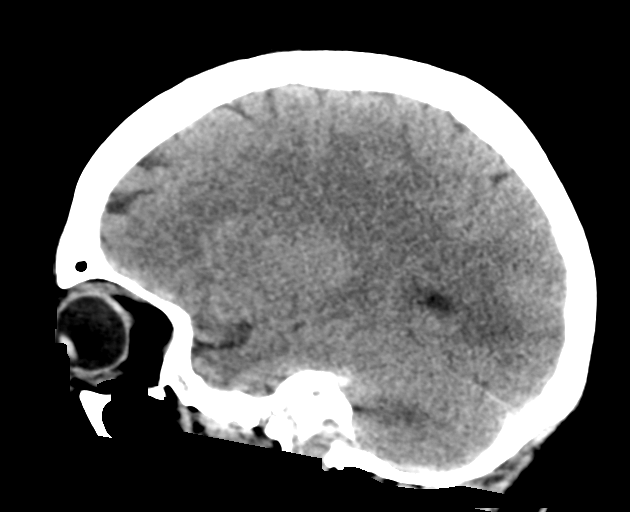
[im 30/59  brain]
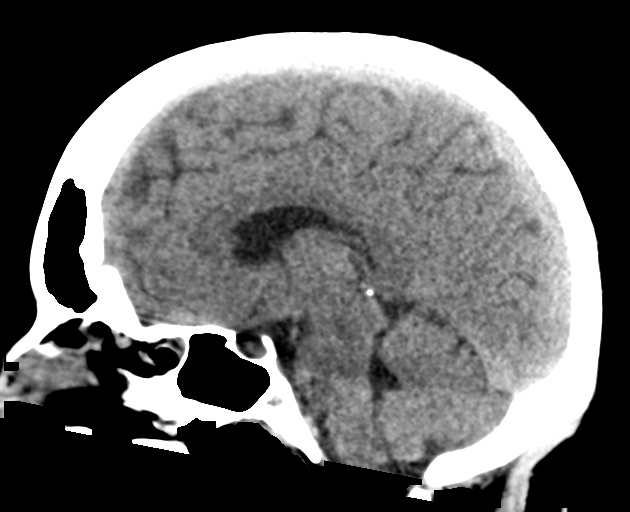
[im 39/59  brain]
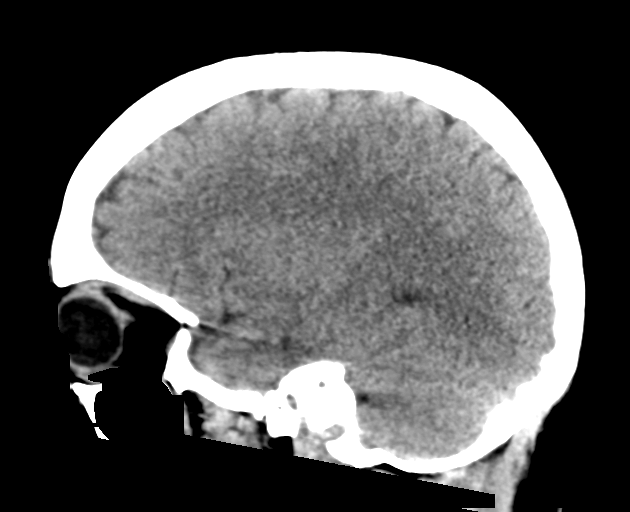

[16 of 47 positions shown; findings below may reference images not displayed]

FINDINGS: CT HEAD FINDINGS

Brain: No evidence of acute infarction, hemorrhage, hydrocephalus,
extra-axial collection or mass lesion/mass effect.

Vascular: No hyperdense vessel or unexpected calcification.

Skull: No acute fracture.

Sinuses/Orbits: Opacification of scattered ethmoid air cells with
mucosal thickening. Mild mucosal thickening of the sphenoid sinuses.
Unremarkable orbits.

Other: No mastoid effusions.

CT CERVICAL SPINE FINDINGS

Alignment: Normal.

Skull base and vertebrae: No evidence of acute fracture. Vertebral
body heights are maintained. Tiny fragment inferior to the posterior
foramen magnum at the occiput is favored degenerative given
corticated appearance and no clear donor site.

Soft tissues and spinal canal: No prevertebral fluid or swelling. No
visible canal hematoma.

Disc levels:  No significant focal degenerative change.

Upper chest: Negative.
IMPRESSION: 1. No evidence of acute intracranial abnormality.
2. No evidence of acute fracture or traumatic malalignment the
cervical spine.

## 2021-12-25 ENCOUNTER — Telehealth: Payer: Self-pay

## 2021-12-25 NOTE — Telephone Encounter (Signed)
        Patient  visited [facility] on 10/24     Telephone encounter attempt :   1st A HIPAA compliant voice message was left requesting a return call.  Instructed patient to call back    Melody Myers [facility], [place], Care Management  [phone] [address], [place], [place] [address] Phone: [phone] Email: [email or web address]       

## 2021-12-28 ENCOUNTER — Ambulatory Visit: Payer: Medicare Other | Admitting: Internal Medicine

## 2021-12-31 ENCOUNTER — Ambulatory Visit: Payer: Medicare Other | Admitting: Pulmonary Disease

## 2022-01-09 ENCOUNTER — Ambulatory Visit (INDEPENDENT_AMBULATORY_CARE_PROVIDER_SITE_OTHER): Payer: Medicare Other | Admitting: Pulmonary Disease

## 2022-01-09 ENCOUNTER — Encounter: Payer: Self-pay | Admitting: Pulmonary Disease

## 2022-01-09 VITALS — BP 110/68 | HR 93 | Ht 64.0 in | Wt 135.8 lb

## 2022-01-09 DIAGNOSIS — J4540 Moderate persistent asthma, uncomplicated: Secondary | ICD-10-CM

## 2022-01-09 DIAGNOSIS — J302 Other seasonal allergic rhinitis: Secondary | ICD-10-CM

## 2022-01-09 DIAGNOSIS — K219 Gastro-esophageal reflux disease without esophagitis: Secondary | ICD-10-CM

## 2022-01-09 DIAGNOSIS — R110 Nausea: Secondary | ICD-10-CM

## 2022-01-09 DIAGNOSIS — F17210 Nicotine dependence, cigarettes, uncomplicated: Secondary | ICD-10-CM

## 2022-01-09 MED ORDER — ONDANSETRON HCL 4 MG PO TABS: 4.0000 mg | ORAL_TABLET | Freq: Three times a day (TID) | ORAL | 0 refills | Status: DC | PRN

## 2022-01-09 NOTE — Progress Notes (Signed)
Synopsis: Referred in January 2023 for dyspnea by Arlester Marker, MD  Subjective:   PATIENT ID: Melody Myers GENDER: female DOB: 18-Mar-1980, MRN: [id number]  HPI  Chief Complaint  Patient presents with   Follow-up    6 mo f/u for asthma.    Melody Myers is a 41 year old woman, daily smoker with history of asthma, MI s/p stent placement in 2020 and covid infections x 2 who returns to pulmonary clinic with cough and dyspnea.   She continues on trelegy 1 puff daily. She does notice if she forgets to use the trelegy that she feels worse and does not sleep as well. She is using albuterol inhaler 1 to 2 times per day on average. She is smoking 2-3 cigarettes per day now.   OV 05/07/21 She was unable to complete pulmonary function testing today due to shortness of breath and cough.  She continues to smoke 3 to 4 cigarettes daily.  She was started on Trelegy Ellipta 1 puff daily at last visit with improvement in her shortness of breath.  She is using albuterol 1-2 times per week.  She continues to take Singulair daily.  She is not using other allergy medicine at this time.  She is using Flonase 2 sprays per nostril daily.  OV 03/02/21 She reports having cough and dyspnea prior to covid in 2021 and 01/2021, but after the infections her symptoms have worsened.   She is currently using breo ellipta 1 puff daily and advair diskus 250-63mg 1-2 puffs per day as needed. She is also using albuterol inhaler and nebulizer treatments as needed.   She continues to smoke 3-5 cigarettes per day and is using e-cigarette daily. An e-cigarette will last her 2-3 weeks. She started smoking at age 41 She was previously smoking 2 packs per day. She quit smoking for 6 months in 2020 after suffering MI. She lives with her sister who smokes. She grew up with significant second hand smoke exposure.   Past Medical History:  Diagnosis Date   Anxiety    Asthma    Bipolar 1 disorder ([place]    Bipolar depression ([place]  03/20/2013   Carpal tunnel syndrome 02/04/2013   Formatting of this note might be different from the original. 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 [facility]  Pt states that all problems are still present.  [facility].  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 10/14/2018   Degenerative disc disease, lumbar 06/05/2015   Formatting of this note might be different from the original. L4-5 > L3-4, mild   Depression    patient reports bipolor   Depression    Diabetic neuropathy ([place]    DKA (diabetic ketoacidoses) 03/19/2013   History of adenomatous polyp of colon 04/17/2015   Formatting of this note might be different from the original. Per path 2016   Hyperlipidemia LDL goal <70 10/04/2018   Hypokalemia 11/15/2014   Ingrown toenail of both feet 04/12/2019   Formatting of this note might be different from the original. great toenails bilateral   Insulin dependent diabetes mellitus with complications    insulin dependant   Jaw pain 02/24/2020   Leukocytosis 03/20/2013   Myocardial  infarction ([place]) 10/02/2018   Neuropathy    Non-ST elevation (NSTEMI) myocardial infarction [facility]) 10/02/2018   NSTEMI (non-ST elevated myocardial infarction) ([place]) 10/02/2018   Superficial incisional surgical site infection 11/14/2014   Syncopal episodes    Tobacco abuse 03/20/2013   Urinary retention 11/14/2014     Family History  Problem Relation Age of Onset   Heart attack Father 81   Diabetes Sister    Heart attack Brother 29   Heart disease Brother    Diabetes Maternal Grandmother    COPD Paternal Grandmother      Social History   Socioeconomic History   Marital status: Widowed    Spouse name: Not on file    Number of children: 2   Years of education: Not on file   Highest education level: Not on file  Occupational History   Occupation: Disabled  Tobacco Use   Smoking status: Every Day    Packs/day: 0.25    Types: Cigarettes   Smokeless tobacco: Never   Tobacco comments:    2-3 cigarettes per day  Vaping Use   Vaping Use: Never used  Substance and Sexual Activity   Alcohol use: Not Currently    Comment: occasional   Drug use: No   Sexual activity: Yes    Comment: tubal ligation  Other Topics Concern   Not on file  Social History Narrative   Not on file   Social Determinants of Health   Financial Resource Strain: High Risk (10/02/2018)   Overall Financial Resource Strain (CARDIA)    Difficulty of Paying Living Expenses: Hard  Food Insecurity: Food Insecurity Present (10/02/2018)   Hunger Vital Sign    Worried About Running Out of Food in the Last Year: Often true    Ran Out of Food in the Last Year: Sometimes true  Transportation Needs: Unmet Transportation Needs (10/02/2018)   PRAPARE - Hydrologist (Medical): Yes    Lack of Transportation (Non-Medical): Yes  Physical Activity: Insufficiently Active (10/02/2018)   Exercise Vital Sign    Days of Exercise per Week: 1 day    Minutes of Exercise per Session: 30 min  Stress: Stress Concern Present (10/02/2018)   [employer]    Feeling of Stress : Very much  Social Connections: Socially Isolated (10/02/2018)   Social Connection and Isolation Panel [NHANES]    Frequency of Communication with Friends and Family: More than three times a week    Frequency of Social Gatherings with Friends and Family: More than three times a week    Attends Religious Services: Never    Marine scientist or Organizations: No    Attends Archivist Meetings: Never    Marital Status: Widowed  Intimate Partner Violence: Not At Risk (10/02/2018)    Humiliation, Afraid, Rape, and Kick questionnaire    Fear of Current or Ex-Partner: No    Emotionally Abused: No    Physically Abused: No    Sexually Abused: No     Allergies  Allergen Reactions   Amoxicillin Itching   Penicillins Hives, Itching and Swelling    Did it involve swelling of the face/tongue/throat, SOB, or low BP? No Did it involve sudden or severe rash/hives, skin peeling, or any reaction on the inside of your mouth or nose? No Did you need to seek medical attention at a hospital or doctor's office? No When did it last happen?      Childhood  If all above answers are "NO", may proceed with cephalosporin use.   Prednisone Itching and Swelling     Outpatient Medications Prior to Visit  Medication Sig Dispense Refill   acetaminophen (TYLENOL) 325 MG tablet Take 650 mg by mouth every 6 (six) hours as needed for moderate pain or headache.      albuterol (VENTOLIN HFA) 108 (90 Base) MCG/ACT inhaler Inhale 2 puffs into the lungs every 6 (six) hours as needed for wheezing or shortness of breath. 8 g 1   aspirin EC 81 MG EC tablet Take 1 tablet (81 mg total) by mouth daily.     atorvastatin (LIPITOR) 80 MG tablet TAKE 1 TABLET BY MOUTH DAILY AT 6 PM. 90 tablet 3   clopidogrel (PLAVIX) 75 MG tablet TAKE 1 TABLET BY MOUTH EVERY DAY 90 tablet 2   Continuous Blood Gluc Sensor (DEXCOM G7 SENSOR) MISC 3 each by Does not apply route every 30 (thirty) days. Apply 1 sensor every 10 days E10.59 9 each 4   Continuous Blood Gluc Transmit (DEXCOM G6 TRANSMITTER) MISC 1 Device by Does not apply route every 3 (three) months. 1 each 3   fluticasone (FLONASE) 50 MCG/ACT nasal spray Place 2 sprays into both nostrils daily. 2 sprays each nostril at night 16 g 6   Fluticasone-Umeclidin-Vilant (TRELEGY ELLIPTA) 100-62.5-25 MCG/ACT AEPB Inhale 1 puff into the lungs daily. 60 each 6   Glucagon, rDNA, (GLUCAGON EMERGENCY) 1 MG KIT Inject 1 mg as directed as needed. for low blood sugar episodes (Patient  taking differently: Inject 1 mg as directed as needed (Low Blood Sugar). for low blood sugar episodes) 1 kit 11   insulin aspart (NOVOLOG) 100 UNIT/ML injection Inject 6-10 Units into the skin 3 (three) times daily with meals. 30 mL 0   Insulin Disposable Pump (OMNIPOD 5 G6 INTRO, GEN 5,) KIT 1 each by Does not apply route as needed. 1 kit 0   Insulin Disposable Pump (OMNIPOD 5 G6 POD, GEN 5,) MISC 1 each by Does not apply route every 3 (three) days. 30 each 3   levalbuterol (XOPENEX) 1.25 MG/3ML nebulizer solution USE 1 VIAL IN NEBULIZER EVERY 6 HOURS (Patient taking differently: Take 1.25 mg by nebulization every 6 (six) hours as needed for wheezing or shortness of breath.) 75 mL 0   loperamide (IMODIUM) 2 MG capsule Take 2 mg by mouth as needed for diarrhea or loose stools.     metoprolol succinate (TOPROL XL) 25 MG 24 hr tablet Take 1 tablet (25 mg total) by mouth daily. 90 tablet 3   montelukast (SINGULAIR) 10 MG tablet Take 1 tablet (10 mg total) by mouth at bedtime. 30 tablet 11   Multiple Vitamin (MULTIVITAMIN WITH MINERALS) TABS Take 1 tablet by mouth daily. Unknown strength     nitroGLYCERIN (NITROSTAT) 0.4 MG SL tablet PLACE 1 TABLET UNDER THE TONGUE EVERY 5 MINUTES X 3 DOSES AS NEEDED FOR CHEST PAIN. MAX3TAB/15MIN 25 tablet 3   pantoprazole (PROTONIX) 40 MG tablet Take 1 tablet (40 mg total) by mouth daily. 90 tablet 3   rizatriptan (MAXALT) 5 MG tablet Take 1 tablet (5 mg total) by mouth as needed for migraine. May repeat in 2 hours if needed 10 tablet 2   triamcinolone cream (KENALOG) 0.1 % Apply 1 application topically 2 (two) times daily. 30 g 0   VENTOLIN HFA 108 (90 Base) MCG/ACT inhaler Inhale 2 puffs into the lungs every 4 (four) hours as needed for wheezing or shortness of breath.  ondansetron (ZOFRAN) 4 MG tablet Take 1 tablet (4 mg total) by mouth every 8 (eight) hours as needed for nausea or vomiting. 20 tablet 2   amLODipine (NORVASC) 2.5 MG tablet Take 1 tablet (2.5 mg  total) by mouth daily. 30 tablet 2   azithromycin (ZITHROMAX) 250 MG tablet Take 250 mg by mouth as directed.     No facility-administered medications prior to visit.   Review of Systems  Constitutional:  Negative for chills, fever, malaise/fatigue and weight loss.  HENT:  Negative for congestion, sinus pain and sore throat.   Eyes: Negative.   Respiratory:  Positive for shortness of breath and wheezing. Negative for cough, hemoptysis and sputum production.   Cardiovascular:  Negative for chest pain, palpitations, orthopnea, claudication and leg swelling.  Gastrointestinal:  Negative for abdominal pain, heartburn, nausea and vomiting.  Genitourinary: Negative.   Musculoskeletal:  Negative for joint pain and myalgias.  Skin:  Negative for rash.  Neurological:  Positive for dizziness. Negative for weakness.  Endo/Heme/Allergies:  Positive for environmental allergies.  Psychiatric/Behavioral: Negative.      Objective:   Vitals:   01/09/22 1024  BP: 110/68  Pulse: 93  SpO2: 99%  Weight: 135 lb 12.8 oz (61.6 kg)  Height: _0  (1.626 m)    Physical Exam Constitutional:      General: She is not in acute distress.    Appearance: She is not ill-appearing.  HENT:     Head: Normocephalic and atraumatic.  Eyes:     Conjunctiva/sclera: Conjunctivae normal.  Cardiovascular:     Rate and Rhythm: Normal rate and regular rhythm.     Pulses: Normal pulses.     Heart sounds: Normal heart sounds. No murmur heard. Pulmonary:     Effort: Pulmonary effort is normal.     Breath sounds: Decreased air movement present. No wheezing, rhonchi or rales.  Musculoskeletal:     Right lower leg: No edema.     Left lower leg: No edema.  Skin:    General: Skin is warm and dry.  Neurological:     General: No focal deficit present.     Mental Status: She is alert.  Psychiatric:        Mood and Affect: Mood normal.        Behavior: Behavior normal.        Thought Content: Thought content normal.         Judgment: Judgment normal.    CBC    Component Value Date/Time   WBC 7.9 12/18/2021 1247   RBC 4.71 12/18/2021 1247   HGB 14.3 12/18/2021 1247   HCT 42.2 12/18/2021 1247   PLT 205 12/18/2021 1247   MCV 89.6 12/18/2021 1247   MCH 30.4 12/18/2021 1247   MCHC 33.9 12/18/2021 1247   RDW 12.5 12/18/2021 1247   LYMPHSABS 2.4 01/05/2021 1655   MONOABS 0.4 01/05/2021 1655   EOSABS 0.1 01/05/2021 1655   BASOSABS 0.1 01/05/2021 1655      Latest Ref Rng & Units 12/18/2021   12:47 PM 08/23/2021    2:31 PM 08/06/2021    8:44 AM  BMP  Glucose 70 - 99 mg/dL 268  210  240   BUN 6 - 20 mg/dL _1 Creatinine 0.44 - 1.00 mg/dL 0.64  0.64  0.70   Sodium 135 - 145 mmol/L 137  138  138   Potassium 3.5 - 5.1 mmol/L 4.6  3.9  4.0   Chloride 98 -  111 mmol/L 105  104  102   CO2 22 - 32 mmol/L _0 Calcium 8.9 - 10.3 mg/dL 9.0  9.1  9.2    Chest imaging: CXR 12/18/21 The heart size and mediastinal contours are within normal limits. Both lungs are clear. The visualized skeletal structures are unremarkable.  CTA Chest 01/08/21 Cardiovascular: Satisfactory opacification of the pulmonary arteries to the segmental level. No evidence of pulmonary embolism. Thoracic aorta is normal in course and caliber. Normal heart size. No pericardial effusion.   Mediastinum/Nodes: No enlarged mediastinal, hilar, or axillary lymph nodes. Thyroid gland, trachea, and esophagus demonstrate no significant findings.   Lungs/Pleura: No focal airspace consolidation. No pleural effusion or pneumothorax.  CT Chest 2021 Mild apical predominant emphysema.  PFT:    Latest Ref Rng & Units 05/07/2021    1:51 PM  PFT Results  DLCO uncorrected ml/min/mmHg 15.34   DLCO UNC% % 69   DLCO corrected ml/min/mmHg 15.34   DLCO COR %Predicted % 69   DLVA Predicted % 75   TLC L 8.09   TLC % Predicted % 160   RV % Predicted % 305    Labs:  Path:  Echo 10/17/20: LV EF 60-65%. Grade I diastolic  dysfunction. RV systolic function is normal.   Heart Catheterization:  Assessment & Plan:   Moderate persistent asthma, unspecified whether complicated - Plan: Pulmonary Function Test  Seasonal allergic rhinitis, unspecified trigger  Cigarette smoker  Nausea - Plan: ondansetron (ZOFRAN) 4 MG tablet  Gastroesophageal reflux disease without esophagitis  Discussion: Melody Myers is a 41 year old woman, daily smoker with history of asthma, MI s/p stent placement in 2020 and covid infections x 2 who returns to pulmonary clinic for asthma.  She has moderate persistent asthma with evidence of apical emphysema on CT chest scans.   She is to continue on trelegy ellipta inhaler 1 puff daily and as needed albuterol.  She is to continue montelukast and Flonase for allergies.  I have instructed her that she can use Allegra or Zyrtec as needed for further allergy treatment.  She is to continue working on smoking cessation with nicotine replacement therapy with 50m daily nicotine patch and mini nicotine lozenges as needed.   Recommend she be evaluated by ophthalmology for her dizziness.  Zofran prescription sent in for nausea with no refills as she will follow up with her primary regarding this medication.  Follow-up in 6 months.  JFreda Jackson MD [facility] Office: [phone]   Current Outpatient Medications:    acetaminophen (TYLENOL) 325 MG tablet, Take 650 mg by mouth every 6 (six) hours as needed for moderate pain or headache. , Disp: , Rfl:    albuterol (VENTOLIN HFA) 108 (90 Base) MCG/ACT inhaler, Inhale 2 puffs into the lungs every 6 (six) hours as needed for wheezing or shortness of breath., Disp: 8 g, Rfl: 1   aspirin EC 81 MG EC tablet, Take 1 tablet (81 mg total) by mouth daily., Disp:  , Rfl:    atorvastatin (LIPITOR) 80 MG tablet, TAKE 1 TABLET BY MOUTH DAILY AT 6 PM., Disp: 90 tablet, Rfl: 3   clopidogrel (PLAVIX) 75 MG tablet, TAKE 1 TABLET  BY MOUTH EVERY DAY, Disp: 90 tablet, Rfl: 2   Continuous Blood Gluc Sensor (DEXCOM G7 SENSOR) MISC, 3 each by Does not apply route every 30 (thirty) days. Apply 1 sensor every 10 days E10.59, Disp: 9 each, Rfl: 4   Continuous  Blood Gluc Transmit (DEXCOM G6 TRANSMITTER) MISC, 1 Device by Does not apply route every 3 (three) months., Disp: 1 each, Rfl: 3   fluticasone (FLONASE) 50 MCG/ACT nasal spray, Place 2 sprays into both nostrils daily. 2 sprays each nostril at night, Disp: 16 g, Rfl: 6   Fluticasone-Umeclidin-Vilant (TRELEGY ELLIPTA) 100-62.5-25 MCG/ACT AEPB, Inhale 1 puff into the lungs daily., Disp: 60 each, Rfl: 6   Glucagon, rDNA, (GLUCAGON EMERGENCY) 1 MG KIT, Inject 1 mg as directed as needed. for low blood sugar episodes (Patient taking differently: Inject 1 mg as directed as needed (Low Blood Sugar). for low blood sugar episodes), Disp: 1 kit, Rfl: 11   insulin aspart (NOVOLOG) 100 UNIT/ML injection, Inject 6-10 Units into the skin 3 (three) times daily with meals., Disp: 30 mL, Rfl: 0   Insulin Disposable Pump (OMNIPOD 5 G6 INTRO, GEN 5,) KIT, 1 each by Does not apply route as needed., Disp: 1 kit, Rfl: 0   Insulin Disposable Pump (OMNIPOD 5 G6 POD, GEN 5,) MISC, 1 each by Does not apply route every 3 (three) days., Disp: 30 each, Rfl: 3   levalbuterol (XOPENEX) 1.25 MG/3ML nebulizer solution, USE 1 VIAL IN NEBULIZER EVERY 6 HOURS (Patient taking differently: Take 1.25 mg by nebulization every 6 (six) hours as needed for wheezing or shortness of breath.), Disp: 75 mL, Rfl: 0   loperamide (IMODIUM) 2 MG capsule, Take 2 mg by mouth as needed for diarrhea or loose stools., Disp: , Rfl:    metoprolol succinate (TOPROL XL) 25 MG 24 hr tablet, Take 1 tablet (25 mg total) by mouth daily., Disp: 90 tablet, Rfl: 3   montelukast (SINGULAIR) 10 MG tablet, Take 1 tablet (10 mg total) by mouth at bedtime., Disp: 30 tablet, Rfl: 11   Multiple Vitamin (MULTIVITAMIN WITH MINERALS) TABS, Take 1 tablet by  mouth daily. Unknown strength, Disp: , Rfl:    nitroGLYCERIN (NITROSTAT) 0.4 MG SL tablet, PLACE 1 TABLET UNDER THE TONGUE EVERY 5 MINUTES X 3 DOSES AS NEEDED FOR CHEST PAIN. MAX3TAB/15MIN, Disp: 25 tablet, Rfl: 3   pantoprazole (PROTONIX) 40 MG tablet, Take 1 tablet (40 mg total) by mouth daily., Disp: 90 tablet, Rfl: 3   rizatriptan (MAXALT) 5 MG tablet, Take 1 tablet (5 mg total) by mouth as needed for migraine. May repeat in 2 hours if needed, Disp: 10 tablet, Rfl: 2   triamcinolone cream (KENALOG) 0.1 %, Apply 1 application topically 2 (two) times daily., Disp: 30 g, Rfl: 0   VENTOLIN HFA 108 (90 Base) MCG/ACT inhaler, Inhale 2 puffs into the lungs every 4 (four) hours as needed for wheezing or shortness of breath., Disp: , Rfl:    amLODipine (NORVASC) 2.5 MG tablet, Take 1 tablet (2.5 mg total) by mouth daily., Disp: 30 tablet, Rfl: 2   ondansetron (ZOFRAN) 4 MG tablet, Take 1 tablet (4 mg total) by mouth every 8 (eight) hours as needed for nausea or vomiting., Disp: 20 tablet, Rfl: 0

## 2022-01-09 NOTE — Patient Instructions (Addendum)
Recommend using 7mg  nicotine patch per day along with mini nicotine lozenges as needed to continue working on quitting smoking.   Continue on the trelegy 1 puff daily - rinse mouth out after each use  Continue to use albuterol inhaler 1-2 puffs every 4-6 hours as needed  We will schedule you for pulmonary function tests at your convenience in the the next few weeks.   Follow up in 6 months

## 2022-01-15 ENCOUNTER — Telehealth: Payer: Self-pay | Admitting: *Deleted

## 2022-01-15 NOTE — Telephone Encounter (Signed)
     Patient  visit on 11/182023  at [facility]  was for treatment  Have you been able to follow up with your primary care physician? patient going to follow up with PCP, not scheduled yet  The patient was able to obtain any needed medicine or equipment.  Are there diet recommendations that you are having difficulty following?  Patient expresses understanding of discharge instructions and education provided has no other needs at this time.    Melody Mao Greenauer -[facility], Population Health [phone] [address] [place] , [place] [address] Email : [email or web address]

## 2022-01-22 DIAGNOSIS — S31831A Laceration without foreign body of anus, initial encounter: Secondary | ICD-10-CM | POA: Insufficient documentation

## 2022-01-22 HISTORY — DX: Laceration without foreign body of anus, initial encounter: S31.831A

## 2022-01-24 ENCOUNTER — Ambulatory Visit: Payer: Medicare Other | Admitting: Dietician

## 2022-01-29 ENCOUNTER — Ambulatory Visit: Payer: Medicare Other | Admitting: Dietician

## 2022-01-30 ENCOUNTER — Telehealth: Payer: Self-pay

## 2022-01-30 NOTE — Telephone Encounter (Signed)
Inbound fax from DME supplier requesting form be completed and faxed with clinical notes. DME supplies ordered via [employer] through online portal.  

## 2022-02-06 ENCOUNTER — Encounter: Payer: Self-pay | Admitting: Cardiology

## 2022-02-08 ENCOUNTER — Other Ambulatory Visit: Payer: Self-pay

## 2022-02-08 DIAGNOSIS — E1059 Type 1 diabetes mellitus with other circulatory complications: Secondary | ICD-10-CM

## 2022-02-08 MED ORDER — INSULIN ASPART 100 UNIT/ML IJ SOLN: 6.0000 [IU] | Freq: Three times a day (TID) | INTRAMUSCULAR | 0 refills | Status: DC

## 2022-02-11 ENCOUNTER — Encounter: Payer: Medicare Other | Admitting: Dietician

## 2022-02-14 MED ORDER — ALPRAZOLAM 0.25 MG PO TABS: 0.2500 mg | ORAL_TABLET | Freq: Two times a day (BID) | ORAL | 0 refills | Status: DC | PRN

## 2022-02-26 ENCOUNTER — Telehealth: Payer: Self-pay

## 2022-02-26 NOTE — Telephone Encounter (Signed)
Pt called to advise her new insurance will not cover Novolog and prefers Humalog. Pt also advised she is currently back on her pump and running out of insulin. She estimates she is using 85 units daily.

## 2022-02-28 ENCOUNTER — Ambulatory Visit: Payer: Medicare Other | Admitting: Nurse Practitioner

## 2022-02-28 ENCOUNTER — Ambulatory Visit (INDEPENDENT_AMBULATORY_CARE_PROVIDER_SITE_OTHER): Payer: Medicare Other | Admitting: Internal Medicine

## 2022-02-28 VITALS — BP 128/62 | HR 98 | Ht 64.0 in | Wt 132.6 lb

## 2022-02-28 DIAGNOSIS — E785 Hyperlipidemia, unspecified: Secondary | ICD-10-CM | POA: Diagnosis not present

## 2022-02-28 DIAGNOSIS — E1059 Type 1 diabetes mellitus with other circulatory complications: Secondary | ICD-10-CM

## 2022-02-28 LAB — POCT GLYCOSYLATED HEMOGLOBIN (HGB A1C): Hemoglobin A1C: 7.7 % — AB (ref 4.0–5.6)

## 2022-02-28 MED ORDER — INSULIN LISPRO 100 UNIT/ML IJ SOLN: INTRAMUSCULAR | 3 refills | Status: DC

## 2022-02-28 MED ORDER — DEXCOM G7 SENSOR MISC: 3.0000 | 4 refills | Status: DC

## 2022-02-28 MED ORDER — GLUCAGON 3 MG/DOSE NA POWD: 3.0000 mg | Freq: Once | NASAL | 11 refills | Status: DC | PRN

## 2022-02-28 NOTE — Patient Instructions (Addendum)
Please use the following pump settings: - Basal rates: 12 am: 1.1 units/h - Insulin to carb ratio: 12 am: 1:20 >> 1:17 - Target: 12 am: 110 - Correction factor (insulin sensitivity factor):  12 am: 1:100  - Active insulin time: 5h  Start the bolus 15 min before every meal, even if the sugars are normal before the meal.  Please read: - Melody Myers - "Think like a Pancreas"  You should have an endocrinology follow-up appointment in 3 months.

## 2022-02-28 NOTE — Progress Notes (Signed)
Patient ID: Melody Myers, female   DOB: [birth year], 42 y.o.   MRN: [id number]  HPI: Melody Myers is a 42 y.o.-year-old female, returning for follow-up for DM1, diagnosed in 2001, uncontrolled, with complications (history of DKA, CAD, NPDR, gastroparesis, PN, history of foot ulcer).  She previously saw Dr. Loanne Drilling, but last visit with me 6 months ago. Prev. Went to [facility].  Interim history: Since last visit, she was able to start on the insulin pump (t:slim) and a Dexcom CGM. No increased urination, blurry vision, nausea, chest pain.  She does have congestion and a sore throat. She had a death in the family and had a lot of stress.  Reviewed HbA1c levels: Lab Results  Component Value Date   HGBA1C 7.6 (H) 08/06/2021   HGBA1C 8.6 (A) 04/05/2021   HGBA1C 8.1 (A) 12/19/2020   Insulin pump: - previously on insulin pump (Omnipod) in 2013 to early 2022.  She stopped when she changed endocrinologists. - now t:slim X2 - started 1st week of 01/2022  CGM: -Dexcom G6   Insulin: -NovoLog >> need to change to Humalog per insurance preference  Supplier: - [facility]  At last visit she was on: - Novolog 10-12 units, ICR 1:2 carb equiv., target 110, ISF ~25 - Basaglar 28 units hs  Now on insulin pump: - Basal rates: 12 am: 1.1 units/h - Insulin to carb ratio: 12 am: 1:20 - Target: 12 am: 110 - Correction factor (insulin sensitivity factor):  12 am: 1:100 - Active insulin time: 5h - Changes infusion site: q3 days  Total daily dose from basal insulin: 70% Total daily dose from bolus insulin: 30% In the last 2 weeks, she was in the auto mode (control IQ) only 51% of the time.  Meter: Accu-Chek  Pt checks her sugars more than 4 times a day with her CGM:     Lowest sugar was 39 >> 37 (did not have the pump on); she has hypoglycemia awareness at 60.  She has a glucagon kit at home. No previous hypoglycemia admission.  Highest sugar was in the 500s >> 447 (did not have the pump  on).  She has a history of DKA.  Pt's meals are usually small.  - no CKD, last BUN/creatinine:  Lab Results  Component Value Date   BUN 8 12/18/2021   BUN 7 08/23/2021   CREATININE 0.64 12/18/2021   CREATININE 0.64 08/23/2021   -+ History of HL; last set of lipids: Lab Results  Component Value Date   CHOL 106 08/06/2021   HDL 39.40 08/06/2021   LDLCALC 46 08/06/2021   TRIG 101.0 08/06/2021   CHOLHDL 3 08/06/2021  She is is on Lipitor 80 mg daily.  - last eye exam was in 2021. ? DR.  - + numbness and tingling in her feet.  Last foot exam 10/19/2020.  Last TSH: No results found for: "TSH"  She has a history of bipolar disease.  ROS: No increased urination, + blurry vision. No nausea, chest pain.  Past Medical History:  Diagnosis Date   Anal sphincter tear 01/22/2022   Anxiety    Asthma    Bipolar 1 disorder ([place])    Bipolar depression ([place]) 03/20/2013   Carpal tunnel syndrome 02/04/2013   Formatting of this note might be different from the original. IMPRESSION: Order right CTI [id number] ASAP   Chest pain 12/24/2018   Chronic pain 03/20/2013   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   Formatting of this note might be different from the original. Repaired  at [facility] in Sept 2016.  Pt states that all problems are still present.  If followed by [facility] for this. She has f/u appt.  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 10/14/2018   Degenerative disc disease, lumbar 06/05/2015   Formatting of this note might be different from the original. L4-5 > L3-4, mild   Depression    patient reports bipolor   Depression    Diabetic neuropathy ([place])    DKA (diabetic ketoacidoses) 03/19/2013   History of adenomatous polyp of colon 04/17/2015   Formatting of this note might be different from the original. Per path 2016   Hyperlipidemia LDL goal <70 10/04/2018   Hypokalemia 11/15/2014   Ingrown  toenail of both feet 04/12/2019   Formatting of this note might be different from the original. great toenails bilateral   Insulin dependent diabetes mellitus with complications    insulin dependant   Jaw pain 02/24/2020   Leukocytosis 03/20/2013   Myocardial infarction ([place]) 10/02/2018   Neuropathy    Non-ST elevation (NSTEMI) myocardial infarction ([place]) 10/02/2018   NSTEMI (non-ST elevated myocardial infarction) ([place]) 10/02/2018   Superficial incisional surgical site infection 11/14/2014   Syncopal episodes    Tobacco abuse 03/20/2013   Urinary retention 11/14/2014   Past Surgical History:  Procedure Laterality Date   CORONARY STENT INTERVENTION N/A 10/02/2018   Procedure: CORONARY STENT INTERVENTION;  Surgeon: Nelva Bush, MD;  Location: [facility];  Service: Cardiovascular;  Laterality: N/A;   LEFT HEART CATH AND CORONARY ANGIOGRAPHY N/A 10/02/2018   Procedure: LEFT HEART CATH AND CORONARY ANGIOGRAPHY;  Surgeon: Nelva Bush, MD;  Location: [facility] [facility];  Service: Cardiovascular;  Laterality: N/A;   TUBAL LIGATION     VAGINA RECONSTRUCTION SURGERY  10/2014   & ana, reconstruction   Social History   Socioeconomic History   Marital status: Widowed    Spouse name: Not on file   Number of children: 2   Years of education: Not on file   Highest education level: Not on file  Occupational History   Occupation: Disabled  Tobacco Use   Smoking status: Every Day    Packs/day: 0.25    Types: Cigarettes   Smokeless tobacco: Never   Tobacco comments:    2-3 cigarettes per day  Vaping Use   Vaping Use: Never used  Substance and Sexual Activity   Alcohol use: Not Currently    Comment: occasional   Drug use: No   Sexual activity: Yes    Comment: tubal ligation  Other Topics Concern   Not on file  Social History Narrative   Not on file   Social Determinants of Health   Financial Resource Strain: High Risk (10/02/2018)   Overall Financial Resource Strain  (CARDIA)    Difficulty of Paying Living Expenses: Hard  Food Insecurity: Food Insecurity Present (10/02/2018)   Hunger Vital Sign    Worried About Running Out of Food in the Last Year: Often true    Ran Out of Food in the Last Year: Sometimes true  Transportation Needs: Unmet Transportation Needs (10/02/2018)   PRAPARE - Hydrologist (Medical): Yes    Lack of Transportation (Non-Medical): Yes  Physical Activity: Insufficiently Active (10/02/2018)   Exercise Vital Sign    Days of Exercise per Week: 1 day    Minutes of Exercise per Session: 30 min  Stress: Stress Concern Present (  10/02/2018)   [place]    Feeling of Stress : Very much  Social Connections: Socially Isolated (10/02/2018)   Social Connection and Isolation Panel [NHANES]    Frequency of Communication with Friends and Family: More than three times a week    Frequency of Social Gatherings with Friends and Family: More than three times a week    Attends Religious Services: Never    Marine scientist or Organizations: No    Attends Archivist Meetings: Never    Marital Status: Widowed  Intimate Partner Violence: Not At Risk (10/02/2018)   Humiliation, Afraid, Rape, and Kick questionnaire    Fear of Current or Ex-Partner: No    Emotionally Abused: No    Physically Abused: No    Sexually Abused: No   Current Outpatient Medications on File Prior to Visit  Medication Sig Dispense Refill   acetaminophen (TYLENOL) 325 MG tablet Take 650 mg by mouth every 6 (six) hours as needed for moderate pain or headache.      albuterol (VENTOLIN HFA) 108 (90 Base) MCG/ACT inhaler Inhale 2 puffs into the lungs every 6 (six) hours as needed for wheezing or shortness of breath. 8 g 1   ALPRAZolam (XANAX) 0.25 MG tablet Take 1 tablet (0.25 mg total) by mouth 2 (two) times daily as needed for anxiety. 14 tablet 0   amLODipine (NORVASC) 2.5 MG tablet  Take 1 tablet (2.5 mg total) by mouth daily. 30 tablet 2   aspirin EC 81 MG EC tablet Take 1 tablet (81 mg total) by mouth daily.     atorvastatin (LIPITOR) 80 MG tablet TAKE 1 TABLET BY MOUTH DAILY AT 6 PM. 90 tablet 3   clopidogrel (PLAVIX) 75 MG tablet TAKE 1 TABLET BY MOUTH EVERY DAY 90 tablet 2   Continuous Blood Gluc Sensor (DEXCOM G7 SENSOR) MISC 3 each by Does not apply route every 30 (thirty) days. Apply 1 sensor every 10 days E10.59 9 each 4   Continuous Blood Gluc Transmit (DEXCOM G6 TRANSMITTER) MISC 1 Device by Does not apply route every 3 (three) months. 1 each 3   fluticasone (FLONASE) 50 MCG/ACT nasal spray Place 2 sprays into both nostrils daily. 2 sprays each nostril at night 16 g 6   Fluticasone-Umeclidin-Vilant (TRELEGY ELLIPTA) 100-62.5-25 MCG/ACT AEPB Inhale 1 puff into the lungs daily. 60 each 6   Glucagon, rDNA, (GLUCAGON EMERGENCY) 1 MG KIT Inject 1 mg as directed as needed. for low blood sugar episodes (Patient taking differently: Inject 1 mg as directed as needed (Low Blood Sugar). for low blood sugar episodes) 1 kit 11   insulin aspart (NOVOLOG) 100 UNIT/ML injection Inject 6-10 Units into the skin 3 (three) times daily with meals. 30 mL 0   Insulin Disposable Pump (OMNIPOD 5 G6 INTRO, GEN 5,) KIT 1 each by Does not apply route as needed. 1 kit 0   Insulin Disposable Pump (OMNIPOD 5 G6 POD, GEN 5,) MISC 1 each by Does not apply route every 3 (three) days. 30 each 3   levalbuterol (XOPENEX) 1.25 MG/3ML nebulizer solution USE 1 VIAL IN NEBULIZER EVERY 6 HOURS (Patient taking differently: Take 1.25 mg by nebulization every 6 (six) hours as needed for wheezing or shortness of breath.) 75 mL 0   loperamide (IMODIUM) 2 MG capsule Take 2 mg by mouth as needed for diarrhea or loose stools.     metoprolol succinate (TOPROL XL) 25 MG 24 hr tablet Take  1 tablet (25 mg total) by mouth daily. 90 tablet 3   montelukast (SINGULAIR) 10 MG tablet Take 1 tablet (10 mg total) by mouth at  bedtime. 30 tablet 11   Multiple Vitamin (MULTIVITAMIN WITH MINERALS) TABS Take 1 tablet by mouth daily. Unknown strength     nitroGLYCERIN (NITROSTAT) 0.4 MG SL tablet PLACE 1 TABLET UNDER THE TONGUE EVERY 5 MINUTES X 3 DOSES AS NEEDED FOR CHEST PAIN. MAX3TAB/15MIN 25 tablet 3   ondansetron (ZOFRAN) 4 MG tablet Take 1 tablet (4 mg total) by mouth every 8 (eight) hours as needed for nausea or vomiting. 20 tablet 0   pantoprazole (PROTONIX) 40 MG tablet Take 1 tablet (40 mg total) by mouth daily. 90 tablet 3   rizatriptan (MAXALT) 5 MG tablet Take 1 tablet (5 mg total) by mouth as needed for migraine. May repeat in 2 hours if needed 10 tablet 2   triamcinolone cream (KENALOG) 0.1 % Apply 1 application topically 2 (two) times daily. 30 g 0   VENTOLIN HFA 108 (90 Base) MCG/ACT inhaler Inhale 2 puffs into the lungs every 4 (four) hours as needed for wheezing or shortness of breath.     No current facility-administered medications on file prior to visit.   Allergies  Allergen Reactions   Amoxicillin Itching   Penicillins Hives, Itching and Swelling    Did it involve swelling of the face/tongue/throat, SOB, or low BP? No Did it involve sudden or severe rash/hives, skin peeling, or any reaction on the inside of your mouth or nose? No Did you need to seek medical attention at a hospital or doctor's office? No When did it last happen?      Childhood If all above answers are "NO", may proceed with cephalosporin use.   Prednisone Itching and Swelling   Family History  Problem Relation Age of Onset   Heart attack Father 61   Diabetes Sister    Heart attack Brother 40   Heart disease Brother    Diabetes Maternal Grandmother    COPD Paternal Grandmother    PE: BP 128/62 (BP Location: Right Arm, Patient Position: Sitting, Cuff Size: Normal)   Pulse 98   Ht _0  (1.626 m)   Wt 132 lb 9.6 oz (60.1 kg)   SpO2 99%   BMI 22.76 kg/m  Wt Readings from Last 3 Encounters:  02/28/22 132 lb 9.6 oz  (60.1 kg)  01/09/22 135 lb 12.8 oz (61.6 kg)  11/15/21 137 lb (62.1 kg)   Constitutional: normal weight, in NAD Eyes: no exophthalmos ENT: no masses palpated in neck, no cervical lymphadenopathy Cardiovascular: Tachycardia, RR, No MRG Respiratory: CTA B Musculoskeletal: no deformities Skin:  no rashes Neurological: no tremor with outstretched hands Diabetic Foot Exam - Simple   Simple Foot Form Diabetic Foot exam was performed with the following findings: Yes 02/28/2022  2:57 PM  Visual Inspection No deformities, no ulcerations, no other skin breakdown bilaterally: Yes Sensation Testing See comments: Yes Pulse Check Posterior Tibialis and Dorsalis pulse intact bilaterally: Yes Comments Decreased sensation to monofilament B forefeet    ASSESSMENT: 1. DM1, uncontrolled, with complications - history of DKA - CAD - NPDR - gastroparesis - PN - history of foot ulcer  2. HL  PLAN:  1. Patient with longstanding, uncontrolled, type 1 diabetes, previously on an OmniPod 5+ Dexcom G6 system, of which she came off when changing endocrinologists.  At our last visit, she was on basal-bolus insulin regimen and without the CGM.  Since last  visit, she was able to start back on insulin pump, at this time, the t:slim X2 system integrated with the Dexcom G6 CGM.  At today's visit we discussed that the t:slim now integrated with the [place] and I sent a prescription for the [place] to her pharmacy.  Also, we need to change from NovoLog to Humalog per insurance preference. CGM interpretation: -At today's visit, we reviewed her CGM downloads: It appears that 46% of values are in target range (goal >70%), while 53% are higher than 180 (goal <25%), and 1% are lower than 70 (goal <4%).  The calculated average blood sugar is 190.  The projected HbA1c for the next 3 months ([facility]MI) is 7.8%. -Reviewing the CGM trends, sugars appear to be fluctuating throughout the day with a significant increase in blood sugar  after breakfast and then again after lunch and dinner.  Sugars are more frequently in the target range overnight, but many of the blood sugars are still above target during the night. -At today's visit, we discussed about adjusting her insulin to carb ratios.  It appears today to be more strict so she gets more insulin for each meal.  She does tell me that when her sugars are within the target range, for example 100, she is not taking a bolus for the respective meal.  I strongly advised her to bolus before every meal.  We also discussed about the fact that if she has a low-carb meal, she needs to count 50% of her protein amount as carbs.  I also recommended a reference book for more help managing the pump. - I suggested to: Patient Instructions  Please use the following pump settings: - Basal rates: 12 am: 1.1 units/h - Insulin to carb ratio: 12 am: 1:20 >> 1:17 - Target: 12 am: 110 - Correction factor (insulin sensitivity factor):  12 am: 1:100  - Active insulin time: 5h  Start the bolus 15 min before every meal, even if the sugars are normal before the meal.  Please read: - Idamae Schuller - "Think like a Pancreas"  You should have an endocrinology follow-up appointment in 3 months.  - we checked her HbA1c: 7.7% (slightly higher) - advised to check sugars at different times of the day - 4x a day, rotating check times - advised for yearly eye exams >> she is not UTD - return to clinic in 3 months  2. HL -Most recent lipid panel reviewed-all fractions at goal: Lab Results  Component Value Date   CHOL 106 08/06/2021   HDL 39.40 08/06/2021   LDLCALC 46 08/06/2021   TRIG 101.0 08/06/2021   CHOLHDL 3 08/06/2021  -She continues on Lipitor 80 mg daily with good tolerance.  Philemon Kingdom, MD PhD [facility] Endocrinology

## 2022-03-01 ENCOUNTER — Encounter: Payer: Self-pay | Admitting: Cardiology

## 2022-03-01 ENCOUNTER — Ambulatory Visit: Payer: Medicare Other | Attending: Cardiology | Admitting: Cardiology

## 2022-03-01 VITALS — BP 90/58 | HR 86 | Ht 64.0 in | Wt 136.2 lb

## 2022-03-01 DIAGNOSIS — F17200 Nicotine dependence, unspecified, uncomplicated: Secondary | ICD-10-CM

## 2022-03-01 DIAGNOSIS — R079 Chest pain, unspecified: Secondary | ICD-10-CM

## 2022-03-01 DIAGNOSIS — E1059 Type 1 diabetes mellitus with other circulatory complications: Secondary | ICD-10-CM | POA: Diagnosis not present

## 2022-03-01 DIAGNOSIS — F319 Bipolar disorder, unspecified: Secondary | ICD-10-CM

## 2022-03-01 DIAGNOSIS — I25119 Atherosclerotic heart disease of native coronary artery with unspecified angina pectoris: Secondary | ICD-10-CM | POA: Diagnosis not present

## 2022-03-01 DIAGNOSIS — J4540 Moderate persistent asthma, uncomplicated: Secondary | ICD-10-CM | POA: Diagnosis not present

## 2022-03-01 DIAGNOSIS — E785 Hyperlipidemia, unspecified: Secondary | ICD-10-CM

## 2022-03-01 MED ORDER — INSULIN LISPRO 100 UNIT/ML IJ SOLN: INTRAMUSCULAR | 3 refills | Status: DC

## 2022-03-01 NOTE — Telephone Encounter (Signed)
Sent yesterday

## 2022-03-01 NOTE — Addendum Note (Signed)
Addended by: Jacobo Forest D on: 03/01/2022 08:59 AM   Modules accepted: Orders

## 2022-03-01 NOTE — Progress Notes (Signed)
Cardiology Office Note:    Date:  03/01/2022   ID:  Melody Myers, DOB [birth year], MRN [id number]  PCP:  Haydee Salter, MD  Cardiologist:  Jenne Campus, MD    Referring MD: Haydee Salter, MD   Chief Complaint  Patient presents with   Chest Pain    History of Present Illness:    Melody Myers is a 42 y.o. female with past medical history significant for coronary artery disease.  In August 2020 she had PTCA and stenting done of the obtuse marginal branch in the face of acute MI, additional problems include diabetes which is poorly controlled, smoking which is still ongoing, dyslipidemia.  For last few months has been experiencing chest pain.  Stress test done in March showed no evidence of ischemia she did quite well on the treadmill but in spite of that still complaining of having chest pain.  Pain happen typically when she gets upset.  She recently purchased a dog and start walking with the dog she said when she walks with the dog she will get chest pain as well.  She had difficulty tolerating med multiple medications she had a problem with ranolazine also had to cut down her metoprolol because of intolerance.  She is still complaining of having chest pain.  On top of that there is a conversation about potentially having some surgical intervention on her anus  Past Medical History:  Diagnosis Date   Anal sphincter tear 01/22/2022   Anxiety    Asthma    Bipolar 1 disorder ([place])    Bipolar depression ([place]) 03/20/2013   Carpal tunnel syndrome 02/04/2013   Formatting of this note might be different from the original. IMPRESSION: Order right CTI 89169- ASAP   Chest pain 12/24/2018   Chronic pain 03/20/2013   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   Formatting of this note might be different from the original. Repaired at [facility] in Sept 2016.  Pt states that all problems are still present.  If followed by [facility] for this. She has f/u appt.  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 10/14/2018   Degenerative disc disease, lumbar 06/05/2015   Formatting of this note might be different from the original. L4-5 > L3-4, mild   Depression    patient reports bipolor   Depression    Diabetic neuropathy ([place])    DKA (diabetic ketoacidoses) 03/19/2013   History of adenomatous polyp of colon 04/17/2015   Formatting of this note might be different from the original. Per path 2016   Hyperlipidemia LDL goal <70 10/04/2018   Hypokalemia 11/15/2014   Ingrown toenail of both feet 04/12/2019   Formatting of this note might be different from the original. great toenails bilateral   Insulin dependent diabetes mellitus with complications    insulin dependant   Jaw pain 02/24/2020   Leukocytosis 03/20/2013   Myocardial infarction ([place]) 10/02/2018   Neuropathy    Non-ST elevation (NSTEMI) myocardial infarction ([place]) 10/02/2018   NSTEMI (non-ST elevated myocardial infarction) ([place]) 10/02/2018   Superficial incisional surgical site infection 11/14/2014   Syncopal episodes    Tobacco abuse 03/20/2013   Urinary retention 11/14/2014    Past Surgical History:  Procedure Laterality Date   CORONARY STENT INTERVENTION N/A 10/02/2018   Procedure: CORONARY STENT INTERVENTION;  Surgeon: Nelva Bush, MD;  Location: [place] [facility];  Service: Cardiovascular;  Laterality: N/A;   LEFT HEART CATH AND CORONARY  ANGIOGRAPHY N/A 10/02/2018   Procedure: LEFT HEART CATH AND CORONARY ANGIOGRAPHY;  Surgeon: End, Christopher, MD;  Location: [facility];  Service: Cardiovascular;  Laterality: N/A;   TUBAL LIGATION     VAGINA RECONSTRUCTION SURGERY  10/2014   & ana, reconstruction    Current Medications: Current Meds  Medication Sig   acetaminophen (TYLENOL) 325 MG tablet Take 650 mg by mouth every 6 (six) hours as needed for moderate pain or headache.    albuterol (VENTOLIN HFA) 108 (90 Base)  MCG/ACT inhaler Inhale 2 puffs into the lungs every 6 (six) hours as needed for wheezing or shortness of breath.   ALPRAZolam (XANAX) 0.25 MG tablet Take 1 tablet (0.25 mg total) by mouth 2 (two) times daily as needed for anxiety.   amLODipine (NORVASC) 2.5 MG tablet Take 1 tablet (2.5 mg total) by mouth daily.   aspirin EC 81 MG EC tablet Take 1 tablet (81 mg total) by mouth daily.   atorvastatin (LIPITOR) 80 MG tablet TAKE 1 TABLET BY MOUTH DAILY AT 6 PM. (Patient taking differently: Take 80 mg by mouth daily.)   clopidogrel (PLAVIX) 75 MG tablet TAKE 1 TABLET BY MOUTH EVERY DAY (Patient taking differently: Take 75 mg by mouth daily.)   Continuous Blood Gluc Sensor (DEXCOM G7 SENSOR) MISC 3 each by Does not apply route every 30 (thirty) days. Apply 1 sensor every 10 days E10.59   fluticasone (FLONASE) 50 MCG/ACT nasal spray Place 2 sprays into both nostrils daily. 2 sprays each nostril at night   Fluticasone-Umeclidin-Vilant (TRELEGY ELLIPTA) 100-62.5-25 MCG/ACT AEPB Inhale 1 puff into the lungs daily.   Glucagon 3 MG/DOSE POWD Place 3 mg into the nose once as needed for up to 1 dose. (Patient taking differently: Place 3 mg into the nose once as needed (Hypoglycemia).)   Insulin Disposable Pump (OMNIPOD 5 G6 INTRO, GEN 5,) KIT 1 each by Does not apply route as needed. (Patient taking differently: 1 each by Does not apply route as needed (Glucose).)   Insulin Infusion Pump (T:SLIM INSULIN PUMP) DEVI 1 Dose by Does not apply route daily.   insulin lispro (HUMALOG) 100 UNIT/ML injection Use up to 100 units a day in the insulin pump (Patient taking differently: Inject 100 Units into the skin daily. Use up to 100 units a day in the insulin pump)   levalbuterol (XOPENEX) 1.25 MG/3ML nebulizer solution USE 1 VIAL IN NEBULIZER EVERY 6 HOURS (Patient taking differently: Take 1.25 mg by nebulization every 6 (six) hours as needed for wheezing or shortness of breath.)   loperamide (IMODIUM) 2 MG capsule Take 2  mg by mouth as needed for diarrhea or loose stools.   metoprolol succinate (TOPROL XL) 25 MG 24 hr tablet Take 1 tablet (25 mg total) by mouth daily.   montelukast (SINGULAIR) 10 MG tablet Take 1 tablet (10 mg total) by mouth at bedtime.   Multiple Vitamin (MULTIVITAMIN WITH MINERALS) TABS Take 1 tablet by mouth daily. Unknown strength   nitroGLYCERIN (NITROSTAT) 0.4 MG SL tablet PLACE 1 TABLET UNDER THE TONGUE EVERY 5 MINUTES X 3 DOSES AS NEEDED FOR CHEST PAIN. MAX3TAB/15MIN (Patient taking differently: Place 0.4 mg under the tongue every 5 (five) minutes as needed for chest pain. PLACE 1 TABLET UNDER THE TONGUE EVERY 5 MINUTES X 3 DOSES AS NEEDED FOR CHEST PAIN. MAX3TAB/15MIN)   ondansetron (ZOFRAN) 4 MG tablet Take 1 tablet (4 mg total) by mouth every 8 (eight) hours as needed for nausea or vomiting.     pantoprazole (PROTONIX) 40 MG tablet Take 1 tablet (40 mg total) by mouth daily.   rizatriptan (MAXALT) 5 MG tablet Take 1 tablet (5 mg total) by mouth as needed for migraine. May repeat in 2 hours if needed   triamcinolone cream (KENALOG) 0.1 % Apply 1 application topically 2 (two) times daily.   VENTOLIN HFA 108 (90 Base) MCG/ACT inhaler Inhale 2 puffs into the lungs every 4 (four) hours as needed for wheezing or shortness of breath.     Allergies:   Amoxicillin, Penicillins, and Prednisone   Social History   Socioeconomic History   Marital status: Widowed    Spouse name: Not on file   Number of children: 2   Years of education: Not on file   Highest education level: Not on file  Occupational History   Occupation: Disabled  Tobacco Use   Smoking status: Every Day    Packs/day: 0.25    Types: Cigarettes   Smokeless tobacco: Never   Tobacco comments:    2-3 cigarettes per day  Vaping Use   Vaping Use: Never used  Substance and Sexual Activity   Alcohol use: Not Currently    Comment: occasional   Drug use: No   Sexual activity: Yes    Comment: tubal ligation  Other Topics  Concern   Not on file  Social History Narrative   Not on file   Social Determinants of Health   Financial Resource Strain: High Risk (10/02/2018)   Overall Financial Resource Strain (CARDIA)    Difficulty of Paying Living Expenses: Hard  Food Insecurity: Food Insecurity Present (10/02/2018)   Hunger Vital Sign    Worried About Running Out of Food in the Last Year: Often true    Ran Out of Food in the Last Year: Sometimes true  Transportation Needs: Unmet Transportation Needs (10/02/2018)   PRAPARE - Hydrologist (Medical): Yes    Lack of Transportation (Non-Medical): Yes  Physical Activity: Insufficiently Active (10/02/2018)   Exercise Vital Sign    Days of Exercise per Week: 1 day    Minutes of Exercise per Session: 30 min  Stress: Stress Concern Present (10/02/2018)   [employer] [place]    Feeling of Stress : Very much  Social Connections: Socially Isolated (10/02/2018)   Social Connection and Isolation Panel [NHANES]    Frequency of Communication with Friends and Family: More than three times a week    Frequency of Social Gatherings with Friends and Family: More than three times a week    Attends Religious Services: Never    Marine scientist or Organizations: No    Attends Archivist Meetings: Never    Marital Status: Widowed     Family History: The patient's family history includes COPD in her paternal grandmother; Diabetes in her maternal grandmother and sister; Heart attack (age of onset: 16) in her brother; Heart attack (age of onset: 83) in her father; Heart disease in her brother. ROS:   Please see the history of present illness.    All 14 point review of systems negative except as described per history of present illness  EKGs/Labs/Other Studies Reviewed:      Recent Labs: 12/18/2021: BUN 8; Creatinine, Ser 0.64; Hemoglobin 14.3; Platelets 205; Potassium 4.6; Sodium 137   Recent Lipid Panel    Component Value Date/Time   CHOL 106 08/06/2021 0844   CHOL 100 06/19/2020 0844   TRIG 101.0 08/06/2021 0844  HDL 39.40 08/06/2021 0844   HDL 40 06/19/2020 0844   CHOLHDL 3 08/06/2021 0844   VLDL 20.2 08/06/2021 0844   LDLCALC 46 08/06/2021 0844   LDLCALC 46 06/19/2020 0844    Physical Exam:    VS:  BP (!) 90/58 (BP Location: Left Arm, Patient Position: Sitting)   Pulse 86   Ht 5\' 4"  (1.626 m)   Wt 136 lb 3.2 oz (61.8 kg)   SpO2 98%   BMI 23.38 kg/m     Wt Readings from Last 3 Encounters:  03/01/22 136 lb 3.2 oz (61.8 kg)  02/28/22 132 lb 9.6 oz (60.1 kg)  01/09/22 135 lb 12.8 oz (61.6 kg)     GEN:  Well nourished, well developed in no acute distress HEENT: Normal NECK: No JVD; No carotid bruits LYMPHATICS: No lymphadenopathy CARDIAC: RRR, no murmurs, no rubs, no gallops RESPIRATORY:  Clear to auscultation without rales, wheezing or rhonchi  ABDOMEN: Soft, non-tender, non-distended MUSCULOSKELETAL:  No edema; No deformity  SKIN: Warm and dry LOWER EXTREMITIES: no swelling NEUROLOGIC:  Alert and oriented x 3 PSYCHIATRIC:  Normal affect   ASSESSMENT:    1. Chest pain, unspecified type   2. Coronary artery disease involving native coronary artery of native heart with angina pectoris ([place])   3. Moderate persistent asthma, unspecified whether complicated   4. 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 Hyperlipidemia LDL goal <70   6. Bipolar 1 disorder ([place])   7. Tobacco use disorder    PLAN:    In order of problems listed above:  Chest pain in this lady with coronary artery disease multiple risk factors for coronary artery disease which is still not well modified including diabetes which is poorly controlled with last hemoglobin A1c of 7.6 from summer of this year, smoking which is still ongoing however she is working on quitting smoking.  I had a long discussion with her about what to do with the situation,  stress test done in March is negative.  She also got some history of bipolar disorder with some anxiety.  It is difficult to distinguish based on the history if this is her heart or if this is anxiety that create a problem.  Therefore we decided to proceed with cardiac catheterization.  Procedure was explained to her 1 more time including all risk benefits as well as alternatives.  She agreed to proceed. Diabetes mellitus.  That being followed by internal medicine team. Dyslipidemia, she is on Lipitor 80 which I will continue, there is a high intensity statin, last cholesterol have from her is from summer 2023 with LDL 46 HDL 39.  Will continue present management. Smoking she is working hard on quitting smoking.  She used for 2 weeks on the nicotine patch.  Without smoking I congratulated her and encouraged her to completely eliminate smoking. In terms of surgical intervention that she is supposed to have 18 January.  She will wait for results of the cardiac catheterization to decide about feasibility of the surgical approach   Medication Adjustments/Labs and Tests Ordered: Current medicines are reviewed at length with the patient today.  Concerns regarding medicines are outlined above.  Orders Placed This Encounter  Procedures   EKG 12-Lead   Medication changes: No orders of the defined types were placed in this encounter.   Signed, Park Liter, MD, [facility] 03/01/2022 8:29 AM    Newborn

## 2022-03-01 NOTE — Addendum Note (Signed)
Addended by: Lauralyn Primes on: 03/01/2022 09:04 AM   Modules accepted: Orders

## 2022-03-01 NOTE — H&P (View-Only) (Signed)
Cardiology Office Note:    Date:  03/01/2022   ID:  [name], DOB [birth year], MRN [id number]  PCP:  Haydee Salter, MD  Cardiologist:  Jenne Campus, MD    Referring MD: Haydee Salter, MD   Chief Complaint  Patient presents with   Chest Pain    History of Present Illness:    Tasheba Henson is a 42 y.o. female with past medical history significant for coronary artery disease.  In August 2020 she had PTCA and stenting done of the obtuse marginal branch in the face of acute MI, additional problems include diabetes which is poorly controlled, smoking which is still ongoing, dyslipidemia.  For last few months has been experiencing chest pain.  Stress test done in March showed no evidence of ischemia she did quite well on the treadmill but in spite of that still complaining of having chest pain.  Pain happen typically when she gets upset.  She recently purchased a dog and start walking with the dog she said when she walks with the dog she will get chest pain as well.  She had difficulty tolerating med multiple medications she had a problem with ranolazine also had to cut down her metoprolol because of intolerance.  She is still complaining of having chest pain.  On top of that there is a conversation about potentially having some surgical intervention on her anus  Past Medical History:  Diagnosis Date   Anal sphincter tear 01/22/2022   Anxiety    Asthma    Bipolar 1 disorder ([place])    Bipolar depression ([place]) 03/20/2013   Carpal tunnel syndrome 02/04/2013   Formatting of this note might be different from the original. IMPRESSION: Order right CTI 89169- ASAP   Chest pain 12/24/2018   Chronic pain 03/20/2013   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   Formatting of this note might be different from the original. Repaired at [facility] in Sept 2016.  Pt states that all problems are still present.  If followed by [facility] for this. She has f/u appt.  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 10/14/2018   Degenerative disc disease, lumbar 06/05/2015   Formatting of this note might be different from the original. L4-5 > L3-4, mild   Depression    patient reports bipolor   Depression    Diabetic neuropathy ([place])    DKA (diabetic ketoacidoses) 03/19/2013   History of adenomatous polyp of colon 04/17/2015   Formatting of this note might be different from the original. Per path 2016   Hyperlipidemia LDL goal <70 10/04/2018   Hypokalemia 11/15/2014   Ingrown toenail of both feet 04/12/2019   Formatting of this note might be different from the original. great toenails bilateral   Insulin dependent diabetes mellitus with complications    insulin dependant   Jaw pain 02/24/2020   Leukocytosis 03/20/2013   Myocardial infarction ([place]) 10/02/2018   Neuropathy    Non-ST elevation (NSTEMI) myocardial infarction ([place]) 10/02/2018   NSTEMI (non-ST elevated myocardial infarction) ([place]) 10/02/2018   Superficial incisional surgical site infection 11/14/2014   Syncopal episodes    Tobacco abuse 03/20/2013   Urinary retention 11/14/2014    Past Surgical History:  Procedure Laterality Date   CORONARY STENT INTERVENTION N/A 10/02/2018   Procedure: CORONARY STENT INTERVENTION;  Surgeon: Nelva Bush, MD;  Location: [place] [facility];  Service: Cardiovascular;  Laterality: N/A;   LEFT HEART CATH AND CORONARY  ANGIOGRAPHY N/A 10/02/2018   Procedure: LEFT HEART CATH AND CORONARY ANGIOGRAPHY;  Surgeon: Yvonne Kendall, MD;  Location: [facility];  Service: Cardiovascular;  Laterality: N/A;   TUBAL LIGATION     VAGINA RECONSTRUCTION SURGERY  10/2014   & ana, reconstruction    Current Medications: Current Meds  Medication Sig   acetaminophen (TYLENOL) 325 MG tablet Take 650 mg by mouth every 6 (six) hours as needed for moderate pain or headache.    albuterol (VENTOLIN HFA) 108 (90 Base)  MCG/ACT inhaler Inhale 2 puffs into the lungs every 6 (six) hours as needed for wheezing or shortness of breath.   ALPRAZolam (XANAX) 0.25 MG tablet Take 1 tablet (0.25 mg total) by mouth 2 (two) times daily as needed for anxiety.   amLODipine (NORVASC) 2.5 MG tablet Take 1 tablet (2.5 mg total) by mouth daily.   aspirin EC 81 MG EC tablet Take 1 tablet (81 mg total) by mouth daily.   atorvastatin (LIPITOR) 80 MG tablet TAKE 1 TABLET BY MOUTH DAILY AT 6 PM. (Patient taking differently: Take 80 mg by mouth daily.)   clopidogrel (PLAVIX) 75 MG tablet TAKE 1 TABLET BY MOUTH EVERY DAY (Patient taking differently: Take 75 mg by mouth daily.)   Continuous Blood Gluc Sensor (DEXCOM G7 SENSOR) MISC 3 each by Does not apply route every 30 (thirty) days. Apply 1 sensor every 10 days E10.59   fluticasone (FLONASE) 50 MCG/ACT nasal spray Place 2 sprays into both nostrils daily. 2 sprays each nostril at night   Fluticasone-Umeclidin-Vilant (TRELEGY ELLIPTA) 100-62.5-25 MCG/ACT AEPB Inhale 1 puff into the lungs daily.   Glucagon 3 MG/DOSE POWD Place 3 mg into the nose once as needed for up to 1 dose. (Patient taking differently: Place 3 mg into the nose once as needed (Hypoglycemia).)   Insulin Disposable Pump (OMNIPOD 5 G6 INTRO, GEN 5,) KIT 1 each by Does not apply route as needed. (Patient taking differently: 1 each by Does not apply route as needed (Glucose).)   Insulin Infusion Pump (T:SLIM INSULIN PUMP) DEVI 1 Dose by Does not apply route daily.   insulin lispro (HUMALOG) 100 UNIT/ML injection Use up to 100 units a day in the insulin pump (Patient taking differently: Inject 100 Units into the skin daily. Use up to 100 units a day in the insulin pump)   levalbuterol (XOPENEX) 1.25 MG/3ML nebulizer solution USE 1 VIAL IN NEBULIZER EVERY 6 HOURS (Patient taking differently: Take 1.25 mg by nebulization every 6 (six) hours as needed for wheezing or shortness of breath.)   loperamide (IMODIUM) 2 MG capsule Take 2  mg by mouth as needed for diarrhea or loose stools.   metoprolol succinate (TOPROL XL) 25 MG 24 hr tablet Take 1 tablet (25 mg total) by mouth daily.   montelukast (SINGULAIR) 10 MG tablet Take 1 tablet (10 mg total) by mouth at bedtime.   Multiple Vitamin (MULTIVITAMIN WITH MINERALS) TABS Take 1 tablet by mouth daily. Unknown strength   nitroGLYCERIN (NITROSTAT) 0.4 MG SL tablet PLACE 1 TABLET UNDER THE TONGUE EVERY 5 MINUTES X 3 DOSES AS NEEDED FOR CHEST PAIN. MAX3TAB/15MIN (Patient taking differently: Place 0.4 mg under the tongue every 5 (five) minutes as needed for chest pain. PLACE 1 TABLET UNDER THE TONGUE EVERY 5 MINUTES X 3 DOSES AS NEEDED FOR CHEST PAIN. MAX3TAB/15MIN)   ondansetron (ZOFRAN) 4 MG tablet Take 1 tablet (4 mg total) by mouth every 8 (eight) hours as needed for nausea or vomiting.  pantoprazole (PROTONIX) 40 MG tablet Take 1 tablet (40 mg total) by mouth daily.   rizatriptan (MAXALT) 5 MG tablet Take 1 tablet (5 mg total) by mouth as needed for migraine. May repeat in 2 hours if needed   triamcinolone cream (KENALOG) 0.1 % Apply 1 application topically 2 (two) times daily.   VENTOLIN HFA 108 (90 Base) MCG/ACT inhaler Inhale 2 puffs into the lungs every 4 (four) hours as needed for wheezing or shortness of breath.     Allergies:   Amoxicillin, Penicillins, and Prednisone   Social History   Socioeconomic History   Marital status: Widowed    Spouse name: Not on file   Number of children: 2   Years of education: Not on file   Highest education level: Not on file  Occupational History   Occupation: Disabled  Tobacco Use   Smoking status: Every Day    Packs/day: 0.25    Types: Cigarettes   Smokeless tobacco: Never   Tobacco comments:    2-3 cigarettes per day  Vaping Use   Vaping Use: Never used  Substance and Sexual Activity   Alcohol use: Not Currently    Comment: occasional   Drug use: No   Sexual activity: Yes    Comment: tubal ligation  Other Topics  Concern   Not on file  Social History Narrative   Not on file   Social Determinants of Health   Financial Resource Strain: High Risk (10/02/2018)   Overall Financial Resource Strain (CARDIA)    Difficulty of Paying Living Expenses: Hard  Food Insecurity: Food Insecurity Present (10/02/2018)   Hunger Vital Sign    Worried About Running Out of Food in the Last Year: Often true    Ran Out of Food in the Last Year: Sometimes true  Transportation Needs: Unmet Transportation Needs (10/02/2018)   PRAPARE - Hydrologist (Medical): Yes    Lack of Transportation (Non-Medical): Yes  Physical Activity: Insufficiently Active (10/02/2018)   Exercise Vital Sign    Days of Exercise per Week: 1 day    Minutes of Exercise per Session: 30 min  Stress: Stress Concern Present (10/02/2018)   [employer] [place]    Feeling of Stress : Very much  Social Connections: Socially Isolated (10/02/2018)   Social Connection and Isolation Panel [NHANES]    Frequency of Communication with Friends and Family: More than three times a week    Frequency of Social Gatherings with Friends and Family: More than three times a week    Attends Religious Services: Never    Marine scientist or Organizations: No    Attends Archivist Meetings: Never    Marital Status: Widowed     Family History: The patient's family history includes COPD in her paternal grandmother; Diabetes in her maternal grandmother and sister; Heart attack (age of onset: 16) in her brother; Heart attack (age of onset: 83) in her father; Heart disease in her brother. ROS:   Please see the history of present illness.    All 14 point review of systems negative except as described per history of present illness  EKGs/Labs/Other Studies Reviewed:      Recent Labs: 12/18/2021: BUN 8; Creatinine, Ser 0.64; Hemoglobin 14.3; Platelets 205; Potassium 4.6; Sodium 137   Recent Lipid Panel    Component Value Date/Time   CHOL 106 08/06/2021 0844   CHOL 100 06/19/2020 0844   TRIG 101.0 08/06/2021 0844  HDL 39.40 08/06/2021 0844   HDL 40 06/19/2020 0844   CHOLHDL 3 08/06/2021 0844   VLDL 20.2 08/06/2021 0844   LDLCALC 46 08/06/2021 0844   LDLCALC 46 06/19/2020 0844    Physical Exam:    VS:  BP (!) 90/58 (BP Location: Left Arm, Patient Position: Sitting)   Pulse 86   Ht 5\' 4"  (1.626 m)   Wt 136 lb 3.2 oz (61.8 kg)   SpO2 98%   BMI 23.38 kg/m     Wt Readings from Last 3 Encounters:  03/01/22 136 lb 3.2 oz (61.8 kg)  02/28/22 132 lb 9.6 oz (60.1 kg)  01/09/22 135 lb 12.8 oz (61.6 kg)     GEN:  Well nourished, well developed in no acute distress HEENT: Normal NECK: No JVD; No carotid bruits LYMPHATICS: No lymphadenopathy CARDIAC: RRR, no murmurs, no rubs, no gallops RESPIRATORY:  Clear to auscultation without rales, wheezing or rhonchi  ABDOMEN: Soft, non-tender, non-distended MUSCULOSKELETAL:  No edema; No deformity  SKIN: Warm and dry LOWER EXTREMITIES: no swelling NEUROLOGIC:  Alert and oriented x 3 PSYCHIATRIC:  Normal affect   ASSESSMENT:    1. Chest pain, unspecified type   2. Coronary artery disease involving native coronary artery of native heart with angina pectoris ([place])   3. Moderate persistent asthma, unspecified whether complicated   4. 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 Hyperlipidemia LDL goal <70   6. Bipolar 1 disorder ([place])   7. Tobacco use disorder    PLAN:    In order of problems listed above:  Chest pain in this lady with coronary artery disease multiple risk factors for coronary artery disease which is still not well modified including diabetes which is poorly controlled with last hemoglobin A1c of 7.6 from summer of this year, smoking which is still ongoing however she is working on quitting smoking.  I had a long discussion with her about what to do with the situation,  stress test done in March is negative.  She also got some history of bipolar disorder with some anxiety.  It is difficult to distinguish based on the history if this is her heart or if this is anxiety that create a problem.  Therefore we decided to proceed with cardiac catheterization.  Procedure was explained to her 1 more time including all risk benefits as well as alternatives.  She agreed to proceed. Diabetes mellitus.  That being followed by internal medicine team. Dyslipidemia, she is on Lipitor 80 which I will continue, there is a high intensity statin, last cholesterol have from her is from summer 2023 with LDL 46 HDL 39.  Will continue present management. Smoking she is working hard on quitting smoking.  She used for 2 weeks on the nicotine patch.  Without smoking I congratulated her and encouraged her to completely eliminate smoking. In terms of surgical intervention that she is supposed to have 18 January.  She will wait for results of the cardiac catheterization to decide about feasibility of the surgical approach   Medication Adjustments/Labs and Tests Ordered: Current medicines are reviewed at length with the patient today.  Concerns regarding medicines are outlined above.  Orders Placed This Encounter  Procedures   EKG 12-Lead   Medication changes: No orders of the defined types were placed in this encounter.   Signed, Park Liter, MD, [facility] 03/01/2022 8:29 AM    Newborn

## 2022-03-01 NOTE — Patient Instructions (Signed)
Medication Instructions:  None     *If you need a refill on your cardiac medications before your next appointment, please call your pharmacy*   Lab Work: CBC- BMP- today  If you have labs (blood work) drawn today and your tests are completely normal, you will receive your results only by: Page (if you have MyChart) OR A paper copy in the mail If you have any lab test that is abnormal or we need to change your treatment, we will call you to review the results.   Testing/Procedures:       Cardiac/Peripheral Catheterization   You are scheduled for a Cardiac Catheterization on Tuesday, January 9 with Dr. [name].  1. Please arrive at the Main Entrance A at [facility]: [place], [place] [address] on January 9 at 5:30 AM (This time is two hours before your procedure to ensure your preparation). Free valet parking service is available. You will check in at ADMITTING. The support person will be asked to wait in the waiting room.  It is OK to have someone drop you off and come back when you are ready to be discharged.        Special note: Every effort is made to have your procedure done on time. Please understand that emergencies sometimes delay scheduled procedures.   . 2. Diet: Do not eat solid foods after midnight.  You may have clear liquids until 5 AM the day of the procedure.  3. Labs: You will need to have blood drawn on Friday, January 5 at [employer]: [address] Technical sales engineer . You do not need to be fasting.  4. Medication instructions in preparation for your procedure:   Contrast Allergy: No    Take 1/2 dose of insulin the nigh before procedure, No insulin the day of the procedure  No oral diabetic medications the day of procedure  On the morning of your procedure, take Aspirin 81 mg and any morning medicines NOT listed above.  You may use sips of water.  5. Plan to go home the same day, you will only stay overnight if  medically necessary. 6. You MUST have a responsible adult to drive you home. 7. An adult MUST be with you the first 24 hours after you arrive home. 8. Bring a current list of your medications, and the last time and date medication taken. 9. Bring ID and current insurance cards. 10.Please wear clothes that are easy to get on and off and wear slip-on shoes.  Thank you for allowing [place] to care for you!   -- [place] [facility]    Follow-Up: At [facility], you and your health needs are our priority.  As part of our continuing mission to provide you with exceptional heart care, we have created designated Provider Care Teams.  These Care Teams include your primary Cardiologist (physician) and Advanced Practice Providers (APPs -  Physician Assistants and Nurse Practitioners) who all work together to provide you with the care you need, when you need it.  We recommend signing up for the patient portal called "MyChart".  Sign up information is provided on this After Visit Summary.  MyChart is used to connect with patients for Virtual Visits (Telemedicine).  Patients are able to view lab/test results, encounter notes, upcoming appointments, etc.  Non-urgent messages can be sent to your provider as well.   To learn more about what you can do with MyChart, go to [email or web address].  Your next appointment:   3 month(s)  The format for your next appointment:   In Person  Provider:   Jenne Campus, MD   Other Instructions  Coronary Angiogram With Stent Coronary angiogram with stent placement is a procedure to widen or open a narrow blood vessel of the heart (coronary artery). Arteries may become blocked by cholesterol buildup (plaques) in the lining of the artery wall. When a coronary artery becomes partially blocked, blood flow to that area decreases. This may lead to chest pain or a heart attack (myocardial infarction). A stent is a small piece of metal that  looks like mesh or spring. Stent placement may be done as treatment after a heart attack, or to prevent a heart attack if a blocked artery is found by a coronary angiogram. Let your health care provider know about: Any allergies you have, including allergies to medicines or contrast dye. All medicines you are taking, including vitamins, herbs, eye drops, creams, and over-the-counter medicines. Any problems you or family members have had with anesthetic medicines. Any blood disorders you have. Any surgeries you have had. Any medical conditions you have, including kidney problems or kidney failure. Whether you are pregnant or may be pregnant. Whether you are breastfeeding. What are the risks? Generally, this is a safe procedure. However, serious problems may occur, including: Damage to nearby structures or organs, such as the heart, blood vessels, or kidneys. A return of blockage. Bleeding, infection, or bruising at the insertion site. A collection of blood under the skin (hematoma) at the insertion site. A blood clot in another part of the body. Allergic reaction to medicines or dyes. Bleeding into the abdomen (retroperitoneal bleeding). Stroke (rare). Heart attack (rare). What happens before the procedure? Staying hydrated Follow instructions from your health care provider about hydration, which may include: Up to 2 hours before the procedure - you may continue to drink clear liquids, such as water, clear fruit juice, black coffee, and plain tea.    Eating and drinking restrictions Follow instructions from your health care provider about eating and drinking, which may include: 8 hours before the procedure - stop eating heavy meals or foods, such as meat, fried foods, or fatty foods. 6 hours before the procedure - stop eating light meals or foods, such as toast or cereal. 2 hours before the procedure - stop drinking clear liquids. Medicines Ask your health care provider  about: Changing or stopping your regular medicines. This is especially important if you are taking diabetes medicines or blood thinners. Taking medicines such as aspirin and ibuprofen. These medicines can thin your blood. Do not take these medicines unless your health care provider tells you to take them. Generally, aspirin is recommended before a thin tube, called a catheter, is passed through a blood vessel and inserted into the heart (cardiac catheterization). Taking over-the-counter medicines, vitamins, herbs, and supplements. General instructions Do not use any products that contain nicotine or tobacco for at least 4 weeks before the procedure. These products include cigarettes, e-cigarettes, and chewing tobacco. If you need help quitting, ask your health care provider. Plan to have someone take you home from the hospital or clinic. If you will be going home right after the procedure, plan to have someone with you for 24 hours. You may have tests and imaging procedures. Ask your health care provider: How your insertion site will be marked. Ask which artery will be used for the procedure. What steps will be taken to help prevent infection. These may  include: Removing hair at the insertion site. Washing skin with a germ-killing soap. Taking antibiotic medicine. What happens during the procedure? An IV will be inserted into one of your veins. Electrodes may be placed on your chest to monitor your heart rate during the procedure. You will be given one or more of the following: A medicine to help you relax (sedative). A medicine to numb the area (local anesthetic) for catheter insertion. A small incision will be made for catheter insertion. The catheter will be inserted into an artery using a guide wire. The location may be in your groin, your wrist, or the fold of your arm (near your elbow). An X-ray procedure (fluoroscopy) will be used to help guide the catheter to the opening of the heart  arteries. A dye will be injected into the catheter. X-rays will be taken. The dye helps to show where any narrowing or blockages are located in the arteries. Tell your health care provider if you have chest pain or trouble breathing. A tiny wire will be guided to the blocked spot, and a balloon will be inflated to make the artery wider. The stent will be expanded to crush the plaques into the wall of the vessel. The stent will hold the area open and improve the blood flow. Most stents have a drug coating to reduce the risk of the stent narrowing over time. The artery may be made wider using a drill, laser, or other tools that remove plaques. The catheter will be removed when the blood flow improves. The stent will stay where it was placed, and the lining of the artery will grow over it. A bandage (dressing) will be placed on the insertion site. Pressure will be applied to stop bleeding. The IV will be removed. This procedure may vary among health care providers and hospitals.    What happens after the procedure? Your blood pressure, heart rate, breathing rate, and blood oxygen level will be monitored until you leave the hospital or clinic. If the procedure is done through the leg, you will lie flat in bed for a few hours or for as long as told by your health care provider. You will be instructed not to bend or cross your legs. The insertion site and the pulse in your foot or wrist will be checked often. You may have more blood tests, X-rays, and a test that records the electrical activity of your heart (electrocardiogram, or ECG). Do not drive for 24 hours if you were given a sedative during your procedure. Summary Coronary angiogram with stent placement is a procedure to widen or open a narrowed coronary artery. This is done to treat heart problems. Before the procedure, let your health care provider know about all the medical conditions and surgeries you have or have had. This is a safe  procedure. However, some problems may occur, including damage to nearby structures or organs, bleeding, blood clots, or allergies. Follow your health care provider's instructions about eating, drinking, medicines, and other lifestyle changes, such as quitting tobacco use before the procedure. This information is not intended to replace advice given to you by your health care provider. Make sure you discuss any questions you have with your health care provider. Document Revised: 09/02/2018 Document Reviewed: 09/02/2018 Elsevier Patient Education  [place].

## 2022-03-02 LAB — BASIC METABOLIC PANEL
BUN/Creatinine Ratio: 11 (ref 9–23)
BUN: 7 mg/dL (ref 6–20)
CO2: 25 mmol/L (ref 20–29)
Calcium: 9.5 mg/dL (ref 8.7–10.2)
Chloride: 103 mmol/L (ref 96–106)
Creatinine, Ser: 0.61 mg/dL (ref 0.57–1.00)
Glucose: 182 mg/dL — ABNORMAL HIGH (ref 70–99)
Potassium: 4.6 mmol/L (ref 3.5–5.2)
Sodium: 140 mmol/L (ref 134–144)
eGFR: 117 mL/min/{1.73_m2} (ref >59)

## 2022-03-02 LAB — CBC
Hematocrit: 41.6 % (ref 34.0–46.6)
Hemoglobin: 13.9 g/dL (ref 11.1–15.9)
MCH: 29.3 pg (ref 26.6–33.0)
MCHC: 33.4 g/dL (ref 31.5–35.7)
MCV: 88 fL (ref 79–97)
Platelets: 270 10*3/uL (ref 150–450)
RBC: 4.74 x10E6/uL (ref 3.77–5.28)
RDW: 12.2 % (ref 11.7–15.4)
WBC: 7.8 10*3/uL (ref 3.4–10.8)

## 2022-03-04 ENCOUNTER — Telehealth: Payer: Self-pay | Admitting: *Deleted

## 2022-03-04 NOTE — Telephone Encounter (Signed)
Cardiac Catheterization scheduled at [facility] for: Tuesday March 05, 2022 7:30 AM Arrival time and place: [place] Entrance A at: 5:30 AM  Nothing to eat after midnight prior to procedure, clear liquids until 5 AM day of procedure.  Medication instructions: -Hold:  Insulin Pump-pt will manage -Usual morning medications can be taken with sips of water including aspirin 81 mg and Plavix 75 mg  Confirmed patient has responsible adult to drive home post procedure and be with patient first 24 hours after arriving home.  Patient reports no new symptoms concerning for COVID-19 in the past 10 days.  Reviewed procedure instructions with patient.

## 2022-03-05 ENCOUNTER — Encounter (HOSPITAL_COMMUNITY)
Admission: RE | Disposition: A | Discharge status: Home / Self Care | Payer: Self-pay | Source: Ambulatory Visit | Attending: Interventional Cardiology

## 2022-03-05 ENCOUNTER — Ambulatory Visit (HOSPITAL_COMMUNITY)
Admission: RE | Admit: 2022-03-05 | Discharge: 2022-03-05 | Disposition: A | Discharge status: Home / Self Care | Payer: Medicare Other | Source: Ambulatory Visit | Attending: Interventional Cardiology | Admitting: Interventional Cardiology

## 2022-03-05 ENCOUNTER — Other Ambulatory Visit: Payer: Self-pay

## 2022-03-05 ENCOUNTER — Encounter (HOSPITAL_COMMUNITY): Payer: Self-pay | Admitting: Interventional Cardiology

## 2022-03-05 DIAGNOSIS — Z955 Presence of coronary angioplasty implant and graft: Secondary | ICD-10-CM | POA: Diagnosis not present

## 2022-03-05 DIAGNOSIS — I25118 Atherosclerotic heart disease of native coronary artery with other forms of angina pectoris: Secondary | ICD-10-CM

## 2022-03-05 DIAGNOSIS — I252 Old myocardial infarction: Secondary | ICD-10-CM | POA: Insufficient documentation

## 2022-03-05 DIAGNOSIS — E785 Hyperlipidemia, unspecified: Secondary | ICD-10-CM | POA: Diagnosis not present

## 2022-03-05 DIAGNOSIS — F17210 Nicotine dependence, cigarettes, uncomplicated: Secondary | ICD-10-CM | POA: Insufficient documentation

## 2022-03-05 DIAGNOSIS — F319 Bipolar disorder, unspecified: Secondary | ICD-10-CM | POA: Insufficient documentation

## 2022-03-05 DIAGNOSIS — Z794 Long term (current) use of insulin: Secondary | ICD-10-CM | POA: Diagnosis not present

## 2022-03-05 DIAGNOSIS — J4540 Moderate persistent asthma, uncomplicated: Secondary | ICD-10-CM | POA: Insufficient documentation

## 2022-03-05 DIAGNOSIS — Z79899 Other long term (current) drug therapy: Secondary | ICD-10-CM | POA: Insufficient documentation

## 2022-03-05 DIAGNOSIS — F419 Anxiety disorder, unspecified: Secondary | ICD-10-CM | POA: Insufficient documentation

## 2022-03-05 DIAGNOSIS — E1040 Type 1 diabetes mellitus with diabetic neuropathy, unspecified: Secondary | ICD-10-CM | POA: Diagnosis not present

## 2022-03-05 DIAGNOSIS — I2510 Atherosclerotic heart disease of native coronary artery without angina pectoris: Secondary | ICD-10-CM

## 2022-03-05 HISTORY — PX: LEFT HEART CATH AND CORONARY ANGIOGRAPHY: CATH118249

## 2022-03-05 LAB — GLUCOSE, CAPILLARY: Glucose-Capillary: 178 mg/dL — ABNORMAL HIGH (ref 70–99)

## 2022-03-05 SURGERY — LEFT HEART CATH AND CORONARY ANGIOGRAPHY
Anesthesia: LOCAL

## 2022-03-05 MED ORDER — FENTANYL CITRATE (PF) 100 MCG/2ML IJ SOLN
INTRAMUSCULAR | Status: AC
  Filled 2022-03-05: qty 2

## 2022-03-05 MED ORDER — HEPARIN SODIUM (PORCINE) 1000 UNIT/ML IJ SOLN
INTRAMUSCULAR | Status: AC
  Filled 2022-03-05: qty 10

## 2022-03-05 MED ORDER — ATORVASTATIN CALCIUM 80 MG PO TABS: 80.0000 mg | ORAL_TABLET | Freq: Every day | ORAL | Status: DC

## 2022-03-05 MED ORDER — VERAPAMIL HCL 2.5 MG/ML IV SOLN
INTRAVENOUS | Status: AC
  Filled 2022-03-05: qty 2

## 2022-03-05 MED ORDER — ONDANSETRON HCL 4 MG/2ML IJ SOLN: 4.0000 mg | Freq: Four times a day (QID) | INTRAMUSCULAR | Status: DC | PRN

## 2022-03-05 MED ORDER — SODIUM CHLORIDE 0.9% FLUSH: 3.0000 mL | INTRAVENOUS | Status: DC | PRN

## 2022-03-05 MED ORDER — SODIUM CHLORIDE 0.9 % IV SOLN: INTRAVENOUS | Status: AC

## 2022-03-05 MED ORDER — HYDRALAZINE HCL 20 MG/ML IJ SOLN: 10.0000 mg | INTRAMUSCULAR | Status: DC | PRN

## 2022-03-05 MED ORDER — CLOPIDOGREL BISULFATE 75 MG PO TABS: 75.0000 mg | ORAL_TABLET | ORAL | Status: DC

## 2022-03-05 MED ORDER — LIDOCAINE HCL (PF) 1 % IJ SOLN
INTRAMUSCULAR | Status: AC
  Filled 2022-03-05: qty 30

## 2022-03-05 MED ORDER — GLUCAGON 3 MG/DOSE NA POWD: 3.0000 mg | Freq: Once | NASAL | Status: DC | PRN

## 2022-03-05 MED ORDER — FENTANYL CITRATE (PF) 100 MCG/2ML IJ SOLN
INTRAMUSCULAR | Status: DC | PRN
  Administered 2022-03-05: 25 ug via INTRAVENOUS

## 2022-03-05 MED ORDER — IOHEXOL 350 MG/ML SOLN
INTRAVENOUS | Status: DC | PRN
  Administered 2022-03-05: 60 mL

## 2022-03-05 MED ORDER — SODIUM CHLORIDE 0.9 % IV SOLN: 250.0000 mL | INTRAVENOUS | Status: DC | PRN

## 2022-03-05 MED ORDER — MIDAZOLAM HCL 2 MG/2ML IJ SOLN
INTRAMUSCULAR | Status: AC
  Filled 2022-03-05: qty 2

## 2022-03-05 MED ORDER — ASPIRIN 81 MG PO CHEW: 81.0000 mg | CHEWABLE_TABLET | ORAL | Status: DC

## 2022-03-05 MED ORDER — CLOPIDOGREL BISULFATE 75 MG PO TABS: 75.0000 mg | ORAL_TABLET | Freq: Every day | ORAL | Status: DC

## 2022-03-05 MED ORDER — VERAPAMIL HCL 2.5 MG/ML IV SOLN
INTRAVENOUS | Status: DC | PRN
  Administered 2022-03-05 (×2): 10 mL via INTRA_ARTERIAL

## 2022-03-05 MED ORDER — SODIUM CHLORIDE 0.9% FLUSH: 3.0000 mL | Freq: Two times a day (BID) | INTRAVENOUS | Status: DC

## 2022-03-05 MED ORDER — MIDAZOLAM HCL 2 MG/2ML IJ SOLN
INTRAMUSCULAR | Status: DC | PRN
  Administered 2022-03-05: 1 mg via INTRAVENOUS

## 2022-03-05 MED ORDER — SODIUM CHLORIDE 0.9 % WEIGHT BASED INFUSION: 1.0000 mL/kg/h | INTRAVENOUS | Status: DC

## 2022-03-05 MED ORDER — ACETAMINOPHEN 325 MG PO TABS: 650.0000 mg | ORAL_TABLET | ORAL | Status: DC | PRN

## 2022-03-05 MED ORDER — HEPARIN (PORCINE) IN NACL 1000-0.9 UT/500ML-% IV SOLN
INTRAVENOUS | Status: DC | PRN
  Administered 2022-03-05 (×2): 500 mL

## 2022-03-05 MED ORDER — HEPARIN (PORCINE) IN NACL 1000-0.9 UT/500ML-% IV SOLN
INTRAVENOUS | Status: AC
  Filled 2022-03-05: qty 1000

## 2022-03-05 MED ORDER — LABETALOL HCL 5 MG/ML IV SOLN: 10.0000 mg | INTRAVENOUS | Status: DC | PRN

## 2022-03-05 MED ORDER — HEPARIN SODIUM (PORCINE) 1000 UNIT/ML IJ SOLN
INTRAMUSCULAR | Status: DC | PRN
  Administered 2022-03-05: 3000 [IU] via INTRAVENOUS

## 2022-03-05 MED ORDER — SODIUM CHLORIDE 0.9 % WEIGHT BASED INFUSION
3.0000 mL/kg/h | INTRAVENOUS | Status: AC
  Administered 2022-03-05: 3 mL/kg/h via INTRAVENOUS

## 2022-03-05 MED ORDER — LIDOCAINE HCL (PF) 1 % IJ SOLN
INTRAMUSCULAR | Status: DC | PRN
  Administered 2022-03-05: 2 mL

## 2022-03-05 SURGICAL SUPPLY — 10 items
CATH 5FR JL3.5 JR4 ANG PIG MP (CATHETERS) IMPLANT
DEVICE RAD COMP TR BAND LRG (VASCULAR PRODUCTS) IMPLANT
GLIDESHEATH SLEND SS 6F .021 (SHEATH) IMPLANT
GUIDEWIRE INQWIRE 1.5J.035X260 (WIRE) IMPLANT
INQWIRE 1.5J .035X260CM (WIRE) ×1
KIT HEART LEFT (KITS) ×1 IMPLANT
PACK CARDIAC CATHETERIZATION (CUSTOM PROCEDURE TRAY) ×1 IMPLANT
SHEATH PROBE COVER 6X72 (BAG) IMPLANT
TRANSDUCER W/STOPCOCK (MISCELLANEOUS) ×1 IMPLANT
TUBING CIL FLEX 10 FLL-RA (TUBING) ×1 IMPLANT

## 2022-03-05 NOTE — Interval H&P Note (Signed)
Cath Lab Visit (complete for each Cath Lab visit)  Clinical Evaluation Leading to the Procedure:   ACS: No.  Non-ACS:    Anginal Classification: CCS III  Anti-ischemic medical therapy: Minimal Therapy (1 class of medications)  Non-Invasive Test Results: No non-invasive testing performed  Prior CABG: No previous CABG   Accelerating anginal pain.  Prior OM stent   History and Physical Interval Note:  03/05/2022 7:41 AM  Melody Myers  has presented today for surgery, with the diagnosis of chest pain, CAD.  The various methods of treatment have been discussed with the patient and family. After consideration of risks, benefits and other options for treatment, the patient has consented to  Procedure(s): LEFT HEART CATH AND CORONARY ANGIOGRAPHY (N/A) as a surgical intervention.  The patient's history has been reviewed, patient examined, no change in status, stable for surgery.  I have reviewed the patient's chart and labs.  Questions were answered to the patient's satisfaction.     Melody Myers

## 2022-03-05 NOTE — Progress Notes (Addendum)
Client c/o seeing "kaleidoscope" like lights on arrival from cath lab and then states it was gone; states has had history of migraines; Mirna Mires notified and no new orders noted; Pupils equal and  reactive; good grips and pedal pushes; no other complaints, awake alert and oriented

## 2022-03-20 ENCOUNTER — Telehealth: Payer: Self-pay

## 2022-03-20 NOTE — Telephone Encounter (Signed)
     Patient  visit on 03/15/2022  at [facility] was for treatment. Patient stated she left without being seen.  Have you been able to follow up with your primary care physician?  The patient was or was not able to obtain any needed medicine or equipment.  Are there diet recommendations that you are having difficulty following?  Patient expresses understanding of discharge instructions and education provided has no other needs at this time.    [facility] Guide   ??[email or web address]  ?? [phone]   Website: [email or web address]  [email or web address]

## 2022-03-26 ENCOUNTER — Encounter: Payer: Medicare Other | Admitting: Dietician

## 2022-04-01 ENCOUNTER — Telehealth: Payer: Self-pay

## 2022-04-01 NOTE — Telephone Encounter (Signed)
Inbound fax from DME supplier requesting form be completed and faxed with clinical notes. DME supplies ordered via [employer] through online portal.  

## 2022-04-02 ENCOUNTER — Other Ambulatory Visit: Payer: Self-pay | Admitting: Internal Medicine

## 2022-04-02 MED ORDER — OMNIPOD 5 DEXG7G6 PODS GEN 5 MISC: 1.0000 | 3 refills | Status: DC

## 2022-04-02 MED ORDER — DEXCOM G6 SENSOR MISC: 1.0000 | 3 refills | Status: DC

## 2022-04-02 MED ORDER — DEXCOM G6 TRANSMITTER MISC: 1.0000 | 3 refills | Status: DC

## 2022-04-04 ENCOUNTER — Telehealth: Payer: Self-pay | Admitting: Dietician

## 2022-04-04 DIAGNOSIS — E1040 Type 1 diabetes mellitus with diabetic neuropathy, unspecified: Secondary | ICD-10-CM

## 2022-04-04 MED ORDER — DEXCOM G6 SENSOR MISC: 1.0000 | 3 refills | Status: AC

## 2022-04-04 MED ORDER — DEXCOM G6 TRANSMITTER MISC: 1.0000 | 3 refills | Status: DC

## 2022-04-04 NOTE — Telephone Encounter (Signed)
Patient called with frustration stating that she needs a Dexcom G6 sensor as she picked up [place] from the pharmacy but G7 is not compatible with the Omnipod 5 insulin pump.    She states that she was to receive a t-slim insulin pump but has been waiting for 3 weeks and no longer wants this.   Omnipod has sent her 3 PDM's and she does not like Omnipod but will continue to use this as her recent control off the pump has been poor.    I have left a message with the [employer] rep to determine the status of her t-slim pump.  Patient is now accepting of this and would still prefer the t-slim pump.  Will ask medical assistant to order Dexcom G6 but discussed with patient concern that insurance will not cover this as she has just picked up her Dexcom G7 sensors.  Will discuss with patient that she can continue to use the Dexcom G7 with the Omnipod 5 pump but will need to enter the sensor readings into the pump as they will not automatically port.  Provided a Dexcom G6 sample which she has picked up.  Patient to call for further questions.  Melody Myers, RD, LDN, CDCES

## 2022-04-08 ENCOUNTER — Other Ambulatory Visit: Payer: Self-pay | Admitting: Cardiology

## 2022-04-08 DIAGNOSIS — I2510 Atherosclerotic heart disease of native coronary artery without angina pectoris: Secondary | ICD-10-CM

## 2022-04-08 NOTE — Telephone Encounter (Signed)
Rx refill sent to pharmacy. 

## 2022-04-10 ENCOUNTER — Telehealth: Payer: Self-pay

## 2022-04-10 NOTE — Telephone Encounter (Signed)
NO answer.  LVM to call pump company if there is a pump issue, or call office if needing insulin, so that we can order you some.  Telephone numbers given.

## 2022-04-10 NOTE — Telephone Encounter (Signed)
Melody Myers, Could you please give her a call today?  I am not sure what the issue is.  It looks like she is trying to contact you. Thank you, C

## 2022-04-10 NOTE — Telephone Encounter (Signed)
Pt called having issues with her insulin pump. Having some issues with both Omni pod/Tandem pump and she is about to be out of long acting insulin. She tried contacting the diabetic educator regarding issues with pump and unable to get in contact.  Called and left a message for pt to call back and give [place] more information.

## 2022-04-11 ENCOUNTER — Ambulatory Visit: Payer: Self-pay | Admitting: Dietician

## 2022-04-11 ENCOUNTER — Ambulatory Visit: Payer: 59 | Admitting: Family Medicine

## 2022-04-11 NOTE — Telephone Encounter (Signed)
Returned patient call to reschedule her appointment today as her car has broken down.  New appointment made.

## 2022-04-12 ENCOUNTER — Telehealth: Payer: Self-pay | Admitting: Dietician

## 2022-04-12 ENCOUNTER — Telehealth: Payer: Self-pay | Admitting: Internal Medicine

## 2022-04-12 DIAGNOSIS — E1059 Type 1 diabetes mellitus with other circulatory complications: Secondary | ICD-10-CM

## 2022-04-12 NOTE — Telephone Encounter (Signed)
I think she has been playing phone tag with the diabetes educators.  If she is off the pump for so long, she definitely needs long-acting insulin.  Until we can get her in, she may need to go back to the previous regimen: - Basaglar 28 units at night -we could call in some more of this if she does not have any more - Novolog insulin to carb ratio 1:17, target 110, insulin sensitivity factor 25 Thank you, C

## 2022-04-12 NOTE — Telephone Encounter (Signed)
Called patient to give help her put pump settings into her new Omnipod PDM.  Her old PDM went blank and she is unable to enter her old settings.  Settings from Dr. Arman Filter last visit 02/28/2022 were given to patient and listed below.  Patient was able to enter these into her new PDM.    PDM Settings Basal settings Max basal 2.2 Basal rates 1.1 Temp basal  on Bolus settings Target Blood glucose  110 Insulin to carb (IC) ratio  1:17 Correction factor 1 mg/dL/unit for BG above 100 mg/dL  Minimum blood glucose for bolus calculations 70 mg/dL Reverse correction on Duration of insulin action  5 hours Extended bolus on Max bolus 15 units  She requested another refill for Basaglar as she used her last 04/11/2022. She will need this in the event that she has another pump failure.  Request sent to MA and noted in MD conversation today. Reviewed these orders with MD.  Antonieta Iba, RD, LDN, CDCES

## 2022-04-12 NOTE — Telephone Encounter (Signed)
Patient is calling saying that her pump was broken and so she got a new one, but it has not been set up and she needs help in getting it set up.  Patient states she has been without a pump for a few weeks now.  Patient stated that she spoke with someone in the Diabetes Department, but they told her that they needed 30 minutes and they did not have any available appointments.

## 2022-04-15 MED ORDER — BASAGLAR KWIKPEN 100 UNIT/ML ~~LOC~~ SOPN: 28.0000 [IU] | PEN_INJECTOR | Freq: Every day | SUBCUTANEOUS | 1 refills | Status: DC

## 2022-04-15 NOTE — Telephone Encounter (Signed)
Rx sent 

## 2022-04-15 NOTE — Addendum Note (Signed)
Addended by: Lauralyn Primes on: 04/15/2022 08:21 AM   Modules accepted: Orders

## 2022-04-26 NOTE — Telephone Encounter (Signed)
Returned patient call. She left a message stating that she was unable to get the Dexcom G6 sensors from the pharmacy.  She uses the Omnipod which is not compatible with the Dexcom G7 at this time. She states that she is ready to try the t:slim pump but has not heard from them.  Left message stating that we have a couple of Dexcom G6 sensors and for her to call if she would like to pick them up.  Called tandem rep.  Faxed 2 chart notes, A1C readings, and c-peptide results and they will work to expedite the t:slim pump.   Have tried multiple times to speak to her in person but am unable to reach her.  Antonieta Iba, RD, LDN, CDCES

## 2022-04-29 ENCOUNTER — Encounter: Payer: 59 | Attending: Internal Medicine | Admitting: Dietician

## 2022-04-29 DIAGNOSIS — E1040 Type 1 diabetes mellitus with diabetic neuropathy, unspecified: Secondary | ICD-10-CM | POA: Insufficient documentation

## 2022-04-29 NOTE — Patient Instructions (Signed)
Omnipod 5 pump  Dexcom G6 run in auto mode   Enter carbs in PDM  Dexcom G7 run in manual mode   Enter carbs and blood glucose in PDM

## 2022-04-29 NOTE — Progress Notes (Signed)
Diabetes Self-Management Education  Visit Type: Follow-up  Appt. Start Time: 1130 Appt. End Time: 1200  04/29/2022  [name], identified by name and date of birth, is a 42 y.o. female with a diagnosis of Diabetes:  .   ASSESSMENT Poor sleep due to death in the family. (Brother in Sports coach).  She was raised by her sister and brother in law.  She and her daughter are currently living with her sister. Wakes every hour. She is going to resume counseling. Increased stress.  Reviewed effects on glucose.  She picked up the Dexcom G7 sensors but her Omnipod is not compatible yet with the G7.  She cannot get G6 sensors again until April.  She has picked up G6 sensors from our office and 3 more were given today to get her through the month. She wishes to change back to the t:slim pump and this process has been expedited with the rep.  History includes:  Type 1 diabetes (2001), HLD, neuropathy, MI, bipolar, smoking Medications include:  Humalog Labs include:  A1C 7.7% 02/28/2022 and 7.6% 08/06/2021 Insulin Pump:  Omnipod 5 CGM:  Rotating between Dexcom G6 and G7 depending on her access Currently she is using the Dexcom G7 sensor but her pump is set on auto mode.  Instructed her to change to manual mode and to enter her sensor reading and provide a bolus.  Sensor reading currently 180.  Reviewed    CGM Results from download:   % Time CGM active:   93 %   (Goal >70%)  Average glucose:   201 mg/dL for 14 days  Glucose management indicator:   8.1 %  Time in range (70-180 mg/dL):   39 %   (Goal >70%)  Time High (181-250 mg/dL):   37 %   (Goal < 25%)  Time Very High (>250 mg/dL):    23 %   (Goal < 5%)  Time Low (54-69 mg/dL):   <1 %   (Goal <4%)  Time Very Low (<54 mg/dL):   0 %   (Goal <1%)    Patient lives with her family.  (sister and patient's daughter currently).  She states that they are trained on treatment of low blood glucose and glucagon administration.    Diabetes Self-Management  Education - 04/29/22 1233       Visit Information   Visit Type Follow-up      Psychosocial Assessment   What is the hardest part about your diabetes right now, causing you the most concern, or is the most worrisome to you about your diabetes?   Other (comment)   obtaining new pump and compatible CGMs   Self-care barriers None    Self-management support Doctor's office;CDE visits    Other persons present Patient    Patient Concerns Glycemic Control;Monitoring    Special Needs None    Preferred Learning Style No preference indicated    Learning Readiness Ready    How often do you need to have someone help you when you read instructions, pamphlets, or other written materials from your doctor or pharmacy? 1 - Never      Pre-Education Assessment   Patient understands the diabetes disease and treatment process. Comprehends key points    Patient understands incorporating nutritional management into lifestyle. Comprehends key points    Patient undertands incorporating physical activity into lifestyle. Comprehends key points    Patient understands using medications safely. Needs Review    Patient understands monitoring blood glucose, interpreting and using results  Comprehends key points    Patient understands prevention, detection, and treatment of acute complications. Comprehends key points    Patient understands prevention, detection, and treatment of chronic complications. Compreheands key points    Patient understands how to develop strategies to address psychosocial issues. Comprehends key points    Patient understands how to develop strategies to promote health/change behavior. Needs Review      Complications   Last HgB A1C per patient/outside source 7.7 %   02/28/2022   How often do you check your blood sugar? > 4 times/day    Fasting Blood glucose range (mg/dL) 70-129;130-179    Postprandial Blood glucose range (mg/dL) 180-200;>200;130-179      Dietary Intake   Breakfast chex cereal,  2% milk, dried banana chips    Snack (morning) 1/2 poptart    Lunch 1/2 cup pears, 2 beef jerkey sticks, hot dog on a bun    Snack (afternoon) Vinegar and cucumbers  Handful of chips    Dinner chicken slaw, biscuit (fried [employer])    Beverage(s) water, diet cranberry juice      Patient Education   Previous Diabetes Education Yes (please comment)   11/2021 and frequently on the phone   Medications Other (comment)   pump education   Monitoring Taught/evaluated CGM (comment)    Chronic complications Relationship between chronic complications and blood glucose control    Diabetes Stress and Support Identified and addressed patients feelings and concerns about diabetes;Worked with patient to identify barriers to care and solutions;Role of stress on diabetes;Helped patient identify a support system for diabetes management      Individualized Goals (developed by patient)   Nutrition Carb counting    Medications take my medication as prescribed    Monitoring  Consistenly use CGM    Problem Solving Medication consistency    Reducing Risk examine blood glucose patterns;treat hypoglycemia with 15 grams of carbs if blood glucose less than '70mg'$ /dL      Patient Self-Evaluation of Goals - Patient rates self as meeting previously set goals (% of time)   Nutrition 50 - 75 % (half of the time)    Medications >75% (most of the time)    Monitoring >75% (most of the time)    Problem Solving and behavior change strategies  50 - 75 % (half of the time)    Reducing Risk (treating acute and chronic complications) 50 - 75 % (half of the time)    Health Coping 50 - 75 % (half of the time)      Post-Education Assessment   Patient understands the diabetes disease and treatment process. Demonstrates understanding / competency    Patient understands incorporating nutritional management into lifestyle. Comprehends key points    Patient undertands incorporating physical activity into lifestyle. Demonstrates understanding  / competency    Patient understands using medications safely. Demonstrates understanding / competency    Patient understands monitoring blood glucose, interpreting and using results Demonstrates understanding / competency    Patient understands prevention, detection, and treatment of acute complications. Demonstrates understanding / competency    Patient understands prevention, detection, and treatment of chronic complications. Demonstrates understanding / competency    Patient understands how to develop strategies to address psychosocial issues. Demonstrates understanding / competency    Patient understands how to develop strategies to promote health/change behavior. Comprehends key points      Outcomes   Expected Outcomes Demonstrated interest in learning. Expect positive outcomes    Future DMSE PRN    Program  Status Completed             Individualized Plan for Diabetes Self-Management Training:   Learning Objective:  Patient will have a greater understanding of diabetes self-management. Patient education plan is to attend individual and/or group sessions per assessed needs and concerns.   Plan:   Patient Instructions  Omnipod 5 pump  Dexcom G6 run in auto mode   Enter carbs in PDM  Dexcom G7 run in manual mode   Enter carbs and blood glucose in PDM     Expected Outcomes:  Demonstrated interest in learning. Expect positive outcomes  Education material provided:   If problems or questions, patient to contact team via:  Phone  Future DSME appointment: PRN

## 2022-05-09 ENCOUNTER — Other Ambulatory Visit: Payer: Self-pay | Admitting: Pulmonary Disease

## 2022-05-09 ENCOUNTER — Other Ambulatory Visit: Payer: Self-pay | Admitting: Family Medicine

## 2022-05-09 DIAGNOSIS — K219 Gastro-esophageal reflux disease without esophagitis: Secondary | ICD-10-CM

## 2022-05-09 DIAGNOSIS — J4540 Moderate persistent asthma, uncomplicated: Secondary | ICD-10-CM

## 2022-05-10 ENCOUNTER — Other Ambulatory Visit: Payer: Self-pay

## 2022-05-10 MED ORDER — NITROGLYCERIN 0.4 MG SL SUBL: 0.4000 mg | SUBLINGUAL_TABLET | SUBLINGUAL | 10 refills | Status: DC | PRN

## 2022-05-12 ENCOUNTER — Other Ambulatory Visit: Payer: Self-pay | Admitting: Family Medicine

## 2022-05-12 DIAGNOSIS — K219 Gastro-esophageal reflux disease without esophagitis: Secondary | ICD-10-CM

## 2022-05-30 ENCOUNTER — Telehealth: Payer: Self-pay | Admitting: Pulmonary Disease

## 2022-05-30 ENCOUNTER — Ambulatory Visit (INDEPENDENT_AMBULATORY_CARE_PROVIDER_SITE_OTHER): Payer: 59 | Admitting: Internal Medicine

## 2022-05-30 ENCOUNTER — Encounter: Payer: Self-pay | Admitting: Cardiology

## 2022-05-30 ENCOUNTER — Encounter: Payer: Self-pay | Admitting: Internal Medicine

## 2022-05-30 VITALS — BP 118/80 | HR 86 | Ht 64.0 in | Wt 133.6 lb

## 2022-05-30 DIAGNOSIS — E785 Hyperlipidemia, unspecified: Secondary | ICD-10-CM | POA: Diagnosis not present

## 2022-05-30 DIAGNOSIS — E1059 Type 1 diabetes mellitus with other circulatory complications: Secondary | ICD-10-CM | POA: Diagnosis not present

## 2022-05-30 LAB — POCT GLYCOSYLATED HEMOGLOBIN (HGB A1C): Hemoglobin A1C: 8.3 % — AB (ref 4.0–5.6)

## 2022-05-30 NOTE — Telephone Encounter (Signed)
Pt dropped off DMV paperwork

## 2022-05-30 NOTE — Patient Instructions (Signed)
Please use the following pump settings: - Basal rates: 12 am: 1.1 units/h - Insulin to carb ratio: 12 am: 1:17 >> 1:13 - Target: 12 am: 110 - Correction factor (insulin sensitivity factor):  12 am: 1:100  - Active insulin time: 5h  Start the bolus 15 min before every meal, even if the sugars are normal before the meal.  You should have an endocrinology follow-up appointment in 4 months.

## 2022-05-30 NOTE — Progress Notes (Signed)
Patient ID: Melody Myers, female   DOB: 09/23/1980, 42 y.o.   MRN: [id number]  HPI: Melody Myers is a 42 y.o.-year-old female, returning for follow-up for DM1, diagnosed in 2001, uncontrolled, with complications (history of DKA, CAD, NPDR, gastroparesis, PN, history of foot ulcer).  She previously saw [facility] and Dr. Loanne Drilling here in the practice, but last visit with me was 3 months ago.   Interim history: No increased urination, blurry vision, nausea, chest pain.  She has congestion. Since last visit, she saw the diabetes educator on 04/29/2022.  Reviewed HbA1c levels: Lab Results  Component Value Date   HGBA1C 7.7 (A) 02/28/2022   HGBA1C 7.6 (H) 08/06/2021   HGBA1C 8.6 (A) 04/05/2021   Insulin pump: - previously on insulin pump (Omnipod) in 2013 to early 2022.  She stopped when she changed endocrinologists. - then t:slim X2 - started 1st week of 01/2022 - on Omnipod 5, but would like to switch back to T:slim  CGM: -Dexcom G6 >> G7  Insulin: -NovoLog >> now Humalog per insurance preference  Supplier: - [employer]  At last visit she was on: - Novolog 10-12 units, ICR 1:2 carb equiv., target 110, ISF ~25 - Basaglar 28 units hs  On insulin pump: - Basal rates: 12 am: 1.1 units/h - Insulin to carb ratio: 12 am: 1:20 >> 1:17 - Target: 12 am: 110 - Correction factor (insulin sensitivity factor):  12 am: 1:100  - Active insulin time: 5h  Total daily dose from basal insulin: 70% >> 66% (34 units) Total daily dose from bolus insulin: 30% >> 34% (18 units) In the last 2 weeks, she was in the auto mode (control IQ) 100% of the time.  Meter: Accu-Chek  Pt checks her sugars more than 4 times a day with her CGM:   Previously:     Lowest sugar was 39 >> 37 (did not have the pump on) >> 55; she has hypoglycemia awareness at 60.  She has a glucagon kit at home. No previous hypoglycemia admission.  Highest sugar was in the 500s >> 447 (did not have the pump  on) >> 400.  She has a history of DKA.  Pt's meals are usually small.  - no CKD, last BUN/creatinine:  Lab Results  Component Value Date   BUN 7 03/01/2022   BUN 8 12/18/2021   CREATININE 0.61 03/01/2022   CREATININE 0.64 12/18/2021   -+ History of HL; last set of lipids: Lab Results  Component Value Date   CHOL 106 08/06/2021   HDL 39.40 08/06/2021   LDLCALC 46 08/06/2021   TRIG 101.0 08/06/2021   CHOLHDL 3 08/06/2021  She is is on Lipitor 80 mg daily.  - last eye exam was in 02/2022: No DR reportedly.  - + numbness and tingling in her feet.  Last foot exam 02/28/2022  TSH: No results found for: "TSH"  She has a history of bipolar disease.  ROS: + see HPI  Past Medical History:  Diagnosis Date   Anal sphincter tear 01/22/2022   Anxiety    Asthma    Bipolar 1 disorder ([place])    Bipolar depression ([place]) 03/20/2013   Carpal tunnel syndrome 02/04/2013   Formatting of this note might be different from the original. IMPRESSION: Order right CTI 16109- ASAP   Chest pain 12/24/2018   Chronic pain 03/20/2013   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   Formatting of this note might be different from the original. Repaired at [facility] in Sept 2016.  Pt  states that all problems are still present.  If followed by [facility] for this. She has f/u appt.  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 10/14/2018   Degenerative disc disease, lumbar 06/05/2015   Formatting of this note might be different from the original. L4-5 > L3-4, mild   Depression    patient reports bipolor   Depression    Diabetic neuropathy ([place])    DKA (diabetic ketoacidoses) 03/19/2013   History of adenomatous polyp of colon 04/17/2015   Formatting of this note might be different from the original. Per path 2016   Hyperlipidemia LDL goal <70 10/04/2018   Hypokalemia 11/15/2014   Ingrown toenail of both feet 04/12/2019    Formatting of this note might be different from the original. great toenails bilateral   Insulin dependent diabetes mellitus with complications    insulin dependant   Jaw pain 02/24/2020   Leukocytosis 03/20/2013   Myocardial infarction ([place]) 10/02/2018   Neuropathy    Non-ST elevation (NSTEMI) myocardial infarction ([place]) 10/02/2018   NSTEMI (non-ST elevated myocardial infarction) ([place]) 10/02/2018   Superficial incisional surgical site infection 11/14/2014   Syncopal episodes    Tobacco abuse 03/20/2013   Urinary retention 11/14/2014   Past Surgical History:  Procedure Laterality Date   CORONARY STENT INTERVENTION N/A 10/02/2018   Procedure: CORONARY STENT INTERVENTION;  Surgeon: Nelva Bush, MD;  Location: [facility];  Service: Cardiovascular;  Laterality: N/A;   LEFT HEART CATH AND CORONARY ANGIOGRAPHY N/A 10/02/2018   Procedure: LEFT HEART CATH AND CORONARY ANGIOGRAPHY;  Surgeon: Nelva Bush, MD;  Location: [facility];  Service: Cardiovascular;  Laterality: N/A;   LEFT HEART CATH AND CORONARY ANGIOGRAPHY N/A 03/05/2022   Procedure: LEFT HEART CATH AND CORONARY ANGIOGRAPHY;  Surgeon: Jettie Booze, MD;  Location: [facility];  Service: Cardiovascular;  Laterality: N/A;   TUBAL LIGATION     VAGINA RECONSTRUCTION SURGERY  10/2014   & ana, reconstruction   Social History   Socioeconomic History   Marital status: Widowed    Spouse name: Not on file   Number of children: 2   Years of education: Not on file   Highest education level: Not on file  Occupational History   Occupation: Disabled  Tobacco Use   Smoking status: Every Day    Packs/day: .25    Types: Cigarettes   Smokeless tobacco: Never   Tobacco comments:    2-3 cigarettes per day  Vaping Use   Vaping Use: Never used  Substance and Sexual Activity   Alcohol use: Not Currently    Comment: occasional   Drug use: No   Sexual activity: Yes    Comment: tubal ligation  Other Topics  Concern   Not on file  Social History Narrative   Not on file   Social Determinants of Health   Financial Resource Strain: High Risk (10/02/2018)   Overall Financial Resource Strain (CARDIA)    Difficulty of Paying Living Expenses: Hard  Food Insecurity: Food Insecurity Present (10/02/2018)   Hunger Vital Sign    Worried About Running Out of Food in the Last Year: Often true    Ran Out of Food in the Last Year: Sometimes true  Transportation Needs: Unmet Transportation Needs (10/02/2018)   PRAPARE - Transportation    Lack of Transportation (Medical): Yes    Lack of Transportation (Non-Medical): Yes  Physical Activity: Insufficiently Active (10/02/2018)  Exercise Vital Sign    Days of Exercise per Week: 1 day    Minutes of Exercise per Session: 30 min  Stress: Stress Concern Present (10/02/2018)   [place]    Feeling of Stress : Very much  Social Connections: Socially Isolated (10/02/2018)   Social Connection and Isolation Panel [NHANES]    Frequency of Communication with Friends and Family: More than three times a week    Frequency of Social Gatherings with Friends and Family: More than three times a week    Attends Religious Services: Never    Marine scientist or Organizations: No    Attends Archivist Meetings: Never    Marital Status: Widowed  Intimate Partner Violence: Not At Risk (10/02/2018)   Humiliation, Afraid, Rape, and Kick questionnaire    Fear of Current or Ex-Partner: No    Emotionally Abused: No    Physically Abused: No    Sexually Abused: No   Current Outpatient Medications on File Prior to Visit  Medication Sig Dispense Refill   Insulin Disposable Pump (OMNIPOD 5 G6 POD, GEN 5,) MISC 1 each by Does not apply route every 3 (three) days. 30 each 3   acetaminophen (TYLENOL) 325 MG tablet Take 650 mg by mouth every 6 (six) hours as needed for moderate pain or headache.      albuterol  (VENTOLIN HFA) 108 (90 Base) MCG/ACT inhaler Inhale 2 puffs into the lungs every 6 (six) hours as needed for wheezing or shortness of breath. 8 g 1   aspirin EC 81 MG EC tablet Take 1 tablet (81 mg total) by mouth daily.     atorvastatin (LIPITOR) 80 MG tablet Take 1 tablet (80 mg total) by mouth at bedtime.     clopidogrel (PLAVIX) 75 MG tablet Take 1 tablet by mouth once daily 90 tablet 3   Continuous Blood Gluc Sensor (DEXCOM G7 SENSOR) MISC 3 each by Does not apply route every 30 (thirty) days. Apply 1 sensor every 10 days E10.59 9 each 4   Continuous Blood Gluc Transmit (DEXCOM G6 TRANSMITTER) MISC 1 Device by Does not apply route every 3 (three) months. 1 each 3   fluticasone (FLONASE) 50 MCG/ACT nasal spray Place 2 sprays into both nostrils daily. 2 sprays each nostril at night (Patient taking differently: Place 2 sprays into both nostrils at bedtime.) 16 g 6   Fluticasone-Umeclidin-Vilant (TRELEGY ELLIPTA) 100-62.5-25 MCG/ACT AEPB Inhale 1 puff into the lungs daily. 60 each 6   Glucagon 3 MG/DOSE POWD Place 3 mg into the nose once as needed for up to 1 dose (Hypoglycemia).     Glycerin-Hypromellose-PEG 400 (DRY EYE RELIEF DROPS OP) Place 1 drop into both eyes daily as needed (dry eye).     Insulin Disposable Pump (OMNIPOD 5 G6 INTRO, GEN 5,) KIT 1 each by Does not apply route as needed. (Patient taking differently: 1 each by Does not apply route as needed (Glucose).) 1 kit 0   Insulin Glargine (BASAGLAR KWIKPEN) 100 UNIT/ML Inject 28 Units into the skin daily. 15 mL 1   Insulin Infusion Pump (T:SLIM INSULIN PUMP) DEVI 1 Dose by Does not apply route daily.     insulin lispro (HUMALOG) 100 UNIT/ML injection Use up to 100 units a day in the insulin pump E10.59 90 mL 3   levalbuterol (XOPENEX) 1.25 MG/3ML nebulizer solution USE 1 VIAL IN NEBULIZER EVERY 6 HOURS 75 mL 0   loperamide (IMODIUM) 2 MG  capsule Take 2 mg by mouth as needed for diarrhea or loose stools.     metoprolol succinate (TOPROL  XL) 25 MG 24 hr tablet Take 1 tablet (25 mg total) by mouth daily. 90 tablet 3   montelukast (SINGULAIR) 10 MG tablet TAKE 1 TABLET BY MOUTH AT BEDTIME 90 tablet 2   nicotine (NICODERM CQ - DOSED IN MG/24 HR) 7 mg/24hr patch Place 7 mg onto the skin daily.     nitroGLYCERIN (NITROSTAT) 0.4 MG SL tablet Place 1 tablet (0.4 mg total) under the tongue every 5 (five) minutes as needed for chest pain. MAX 3 tab / 15 min 25 tablet 10   pantoprazole (PROTONIX) 40 MG tablet Take 1 tablet (40 mg total) by mouth daily. 90 tablet 3   rizatriptan (MAXALT) 5 MG tablet Take 1 tablet (5 mg total) by mouth as needed for migraine. May repeat in 2 hours if needed 10 tablet 2   triamcinolone cream (KENALOG) 0.1 % Apply 1 application topically 2 (two) times daily. (Patient taking differently: Apply 1 application  topically daily as needed (itching).) 30 g 0   No current facility-administered medications on file prior to visit.   Allergies  Allergen Reactions   Amoxicillin Itching   Penicillins Hives, Itching and Swelling    Did it involve swelling of the face/tongue/throat, SOB, or low BP? No Did it involve sudden or severe rash/hives, skin peeling, or any reaction on the inside of your mouth or nose? No Did you need to seek medical attention at a hospital or doctor's office? No When did it last happen?      Childhood If all above answers are "NO", may proceed with cephalosporin use.   Prednisone Itching and Swelling   Family History  Problem Relation Age of Onset   Heart attack Father 38   Diabetes Sister    Heart attack Brother 37   Heart disease Brother    Diabetes Maternal Grandmother    COPD Paternal Grandmother    PE: BP 118/80 (BP Location: Left Arm, Patient Position: Sitting, Cuff Size: Normal)   Pulse 86   Ht 5\' 4"  (1.626 m)   Wt 133 lb 9.6 oz (60.6 kg)   SpO2 96%   BMI 22.93 kg/m  Wt Readings from Last 3 Encounters:  05/30/22 133 lb 9.6 oz (60.6 kg)  03/05/22 136 lb (61.7 kg)  03/01/22  136 lb 3.2 oz (61.8 kg)   Constitutional: normal weight, in NAD Eyes: no exophthalmos ENT: no masses palpated in neck, no cervical lymphadenopathy Cardiovascular: RRR, No MRG Respiratory: CTA B Musculoskeletal: no deformities Skin:  no rashes Neurological: no tremor with outstretched hands  ASSESSMENT: 1. DM1, uncontrolled, with complications - history of DKA - CAD - NPDR - gastroparesis - PN - history of foot ulcer  2. HL  PLAN:  1. Patient with longstanding, uncontrolled, type 2 diabetes, previously on OmniPod 5+ Dexcom G6 CGM, of which she came off when changing endocrinologists.  She was on a basal/bolus insulin regimen and without the CGM for period of time, but she was able to start back on insulin pump, t:slim X 2 integrated with the Dexcom G6 CGM.  However, she is currently on the OmniPod and would like to go back to the t:slim pump.  We have her on Humalog per insurance preference. -At last visit, sugars were fluctuating throughout the day with a significant increase in blood sugars after breakfast and again after lunch and dinner.  Sugars were frequently in the  target range overnight but many of them were still above target.  I advised her to strengthen her insulin to carb ratios.  Upon questioning, she was not taking a bolus when sugars were in the target range, for example 100 and we discussed about the absolute need to do so based on the number of carbs.  Also, when she was eating a low-carb meal, I advised her to count 50% of the protein amount as grams.  I also recommended a reference book for more help managing the insulin pump. -Since last visit, she switched to the [place] CGM CGM interpretation: -At today's visit, we reviewed her CGM downloads: It appears that 59% of values are in target range (goal >70%), while 40% are higher than 180 (goal <25%), and 1% are lower than 70 (goal <4%).  The calculated average blood sugar is 173.  The projected HbA1c for the next 3  months (GMI) is 7.4%. -Reviewing the CGM trends, sugars in the last 2 weeks improved significantly compared to the previous 2 weeks.  Upon questioning, before the last 2 weeks she was helping her sister after her brother-in-law passing and she was under a lot of stress.  In the last 2 weeks she has been home and stress improved.  The sugars still appear to increase after each meal, in a stepwise fashion despite doing a great job introducing carbs into the pump and bolusing before every meal.  We discussed that this is usually related to insufficient insulin with meals so I advised her to strengthen her insulin to carb ratios.  She is doing better with bolusing before meals and she is working on improving her diet.  For now, I did not suggest other changes. -She would like to go back to the t:slim insulin pump - I suggested to: Patient Instructions  Please use the following pump settings: - Basal rates: 12 am: 1.1 units/h - Insulin to carb ratio: 12 am: 1:17 >> 1:13 - Target: 12 am: 110 - Correction factor (insulin sensitivity factor):  12 am: 1:100  - Active insulin time: 5h  Start the bolus 15 min before every meal, even if the sugars are normal before the meal.  You should have an endocrinology follow-up appointment in 4 months.  - we checked her HbA1c: 8.3% (higher) - advised to check sugars at different times of the day - 4x a day, rotating check times - advised for yearly eye exams >> she is UTD - return to clinic in 4 months  2. HL -Reviewed latest lipid panel from 07/2021: All fractions at goal: Lab Results  Component Value Date   CHOL 106 08/06/2021   HDL 39.40 08/06/2021   LDLCALC 46 08/06/2021   TRIG 101.0 08/06/2021   CHOLHDL 3 08/06/2021  -She continues on Lipitor 80 mg daily with good tolerance  Philemon Kingdom, MD PhD [facility]

## 2022-06-03 ENCOUNTER — Telehealth: Payer: Self-pay

## 2022-06-03 NOTE — Telephone Encounter (Signed)
Inbound call from DME supplier requesting form be completed and faxed with clinical notes. DME supplies ordered via [employer] through online portal. [facility]: Tandem pump and G7 CGM resupply

## 2022-06-06 ENCOUNTER — Ambulatory Visit: Payer: Medicare Other | Admitting: Cardiology

## 2022-06-11 ENCOUNTER — Telehealth: Payer: Self-pay

## 2022-06-11 DIAGNOSIS — E1059 Type 1 diabetes mellitus with other circulatory complications: Secondary | ICD-10-CM

## 2022-06-11 NOTE — Telephone Encounter (Signed)
Called and left voicemail for pt to call back to schedule lab appt. Labs ordered.

## 2022-06-11 NOTE — Telephone Encounter (Signed)
Patients need C-peptide and fasting glucose for Dexcom order.

## 2022-06-12 NOTE — Telephone Encounter (Signed)
Patient is scheduled for 06/17/2022 for labs.

## 2022-06-12 NOTE — Telephone Encounter (Signed)
Cherina, please advise if paperwork was handled for pt and if this encounter can be closed.

## 2022-06-17 ENCOUNTER — Other Ambulatory Visit: Payer: Self-pay

## 2022-06-17 ENCOUNTER — Other Ambulatory Visit (INDEPENDENT_AMBULATORY_CARE_PROVIDER_SITE_OTHER): Payer: 59

## 2022-06-17 DIAGNOSIS — E1059 Type 1 diabetes mellitus with other circulatory complications: Secondary | ICD-10-CM

## 2022-06-17 NOTE — Telephone Encounter (Signed)
Called and spoke with patient. I let her know we have the paperwork and Dr. Francine Graven would be able to review it on 4/29. She verbalized understanding.

## 2022-06-17 NOTE — Telephone Encounter (Signed)
Patient checking on DMV paperwork that was dropped off. Patient phone number is [phone].

## 2022-06-18 LAB — GLUCOSE, FASTING: Glucose, Bld: 106 mg/dL — ABNORMAL HIGH (ref 65–99)

## 2022-06-18 LAB — C-PEPTIDE: C-Peptide: <0.1 ng/mL — ABNORMAL LOW (ref 0.80–3.85)

## 2022-06-20 NOTE — Telephone Encounter (Signed)
Recent labs uploaded to [employer].

## 2022-06-23 ENCOUNTER — Other Ambulatory Visit: Payer: Self-pay | Admitting: Pulmonary Disease

## 2022-06-23 DIAGNOSIS — J4540 Moderate persistent asthma, uncomplicated: Secondary | ICD-10-CM

## 2022-06-26 ENCOUNTER — Other Ambulatory Visit: Payer: Self-pay

## 2022-06-26 DIAGNOSIS — E1059 Type 1 diabetes mellitus with other circulatory complications: Secondary | ICD-10-CM

## 2022-06-26 MED ORDER — INSULIN LISPRO 100 UNIT/ML IJ SOLN: INTRAMUSCULAR | 3 refills | Status: DC

## 2022-06-26 NOTE — Telephone Encounter (Signed)
Forms have been completed. I called the patient to let her know but she did not answer. Left message for her to call back. I will hold onto forms until hearing back from patient.

## 2022-07-04 DIAGNOSIS — J45909 Unspecified asthma, uncomplicated: Secondary | ICD-10-CM | POA: Insufficient documentation

## 2022-07-05 ENCOUNTER — Ambulatory Visit: Payer: 59 | Attending: Cardiology | Admitting: Cardiology

## 2022-07-05 ENCOUNTER — Encounter: Payer: Self-pay | Admitting: Cardiology

## 2022-07-05 VITALS — BP 114/66 | HR 95 | Ht 61.0 in | Wt 137.8 lb

## 2022-07-05 DIAGNOSIS — F17200 Nicotine dependence, unspecified, uncomplicated: Secondary | ICD-10-CM

## 2022-07-05 DIAGNOSIS — E785 Hyperlipidemia, unspecified: Secondary | ICD-10-CM

## 2022-07-05 DIAGNOSIS — F319 Bipolar disorder, unspecified: Secondary | ICD-10-CM | POA: Diagnosis not present

## 2022-07-05 DIAGNOSIS — E1059 Type 1 diabetes mellitus with other circulatory complications: Secondary | ICD-10-CM | POA: Diagnosis not present

## 2022-07-05 DIAGNOSIS — I25119 Atherosclerotic heart disease of native coronary artery with unspecified angina pectoris: Secondary | ICD-10-CM

## 2022-07-05 NOTE — Patient Instructions (Signed)

## 2022-07-05 NOTE — Progress Notes (Signed)
Cardiology Office Note:    Date:  07/05/2022   ID:  Melody Myers, DOB October 01, 1980, MRN [id number]  PCP:  Howell Pringle, PA-C  Cardiologist:  Gypsy Balsam, MD    Referring MD: Loyola Mast, MD   Chief Complaint  Patient presents with   Follow-up    History of Present Illness:    Melody Myers is a 42 y.o. female with past medical history significant for coronary artery disease.  In August 2020 she did have PTCA and stenting done of the obtuse marginal branch in face of acute myocardial infarction additional problem include type 1 diabetes, smoking likely she just recently quit, dyslipidemia, psychological issues. Last time I seen her she Complained of having chest pain.  In March we did stress test which showed no evidence of ischemia but because of her multiple risk factors for coronary artery disease we decided to proceed with cardiac catheterization to rule out obstructive disease.  Her left side cardiac catheterization was done on March 05, 2022 which showed proximal circumflex 35% stenosis, ramus 30% stenosis, mid LAD 40% stenosis proximal RCA mid RCA 40% stenosis, obtuse marginal 1 stent was widely patent ejection fraction was normal interestingly study appears to have less stenosis that noted previously in mid LAD and mid RCA. She comes to the to months for follow-up.  Overall she is doing well denies have any chest pain tightness squeezing pressure burning chest overall feeling good.  Past Medical History:  Diagnosis Date   Anal sphincter tear 01/22/2022   Anxiety    Asthma    Bipolar 1 disorder (HCC)    Bipolar depression (HCC) 03/20/2013   Carpal tunnel syndrome 02/04/2013   Formatting of this note might be different from the original. IMPRESSION: Order right CTI [id number]- ASAP   Chest pain 12/24/2018   Chronic pain 03/20/2013   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   Formatting of this note might be different from the original. Repaired at [facility] in Sept 2016.  Pt states  that all problems are still present.  If followed by [facility] for this. She has f/u appt.  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 10/14/2018   Degenerative disc disease, lumbar 06/05/2015   Formatting of this note might be different from the original. L4-5 > L3-4, mild   Depression    patient reports bipolor   Depression    Diabetic neuropathy (HCC)    DKA (diabetic ketoacidoses) 03/19/2013   History of adenomatous polyp of colon 04/17/2015   Formatting of this note might be different from the original. Per path 2016   History of anxiety 04/17/2015   History of non-ST elevation myocardial infarction (NSTEMI) 04/30/2021   Hyperlipidemia LDL goal <70 10/04/2018   Hypokalemia 11/15/2014   Ingrown toenail of both feet 04/12/2019   Formatting of this note might be different from the original. great toenails bilateral   Insulin dependent diabetes mellitus with complications    insulin dependant   Jaw pain 02/24/2020   Keratoconjunctivitis sicca of both eyes not specified as Sjogren's 09/24/2016   Leukocytes in urine 06/08/2017   Leukocytosis 03/20/2013   Mild diastolic dysfunction 10/27/2020   Myocardial infarction (HCC) 10/02/2018   Myopia of both eyes 09/24/2016   Neuropathy    Non-ST elevation (NSTEMI) myocardial infarction [facility]) 10/02/2018   NSTEMI (non-ST elevated myocardial infarction) (HCC) 10/02/2018   Spontaneous bruising 08/26/2018   SUI (stress urinary incontinence, female) 12/11/2016  Superficial incisional surgical site infection 11/14/2014   Syncopal episodes    Tobacco abuse 03/20/2013   Urinary incontinence 06/08/2017   Urinary retention 11/14/2014   Vitreous floater, bilateral 09/24/2016    Past Surgical History:  Procedure Laterality Date   CORONARY STENT INTERVENTION N/A 10/02/2018   Procedure: CORONARY STENT INTERVENTION;  Surgeon: Yvonne Kendall, MD;   Location: [facility] INVASIVE CV LAB;  Service: Cardiovascular;  Laterality: N/A;   LEFT HEART CATH AND CORONARY ANGIOGRAPHY N/A 10/02/2018   Procedure: LEFT HEART CATH AND CORONARY ANGIOGRAPHY;  Surgeon: Yvonne Kendall, MD;  Location: [facility] INVASIVE CV LAB;  Service: Cardiovascular;  Laterality: N/A;   LEFT HEART CATH AND CORONARY ANGIOGRAPHY N/A 03/05/2022   Procedure: LEFT HEART CATH AND CORONARY ANGIOGRAPHY;  Surgeon: Corky Crafts, MD;  Location: [facility] INVASIVE CV LAB;  Service: Cardiovascular;  Laterality: N/A;   TUBAL LIGATION     VAGINA RECONSTRUCTION SURGERY  10/2014   & ana, reconstruction    Current Medications: Current Meds  Medication Sig   acetaminophen (TYLENOL) 325 MG tablet Take 650 mg by mouth every 6 (six) hours as needed for moderate pain or headache.    albuterol (VENTOLIN HFA) 108 (90 Base) MCG/ACT inhaler Inhale 2 puffs into the lungs every 6 (six) hours as needed for wheezing or shortness of breath.   aspirin EC 81 MG EC tablet Take 1 tablet (81 mg total) by mouth daily.   atorvastatin (LIPITOR) 80 MG tablet Take 1 tablet (80 mg total) by mouth at bedtime.   clopidogrel (PLAVIX) 75 MG tablet Take 1 tablet by mouth once daily   Continuous Blood Gluc Sensor (DEXCOM G7 SENSOR) MISC 3 each by Does not apply route every 30 (thirty) days. Apply 1 sensor every 10 days E10.59   Continuous Blood Gluc Transmit (DEXCOM G6 TRANSMITTER) MISC 1 Device by Does not apply route every 3 (three) months.   fluticasone (FLONASE) 50 MCG/ACT nasal spray Place 2 sprays into both nostrils daily. 2 sprays each nostril at night (Patient taking differently: Place 2 sprays into both nostrils at bedtime.)   Fluticasone-Umeclidin-Vilant (TRELEGY ELLIPTA) 100-62.5-25 MCG/ACT AEPB INHALE 1 PUFF INTO LUNGS ONCE DAILY (Patient taking differently: Inhale 1 puff into the lungs daily.)   Glucagon 3 MG/DOSE POWD Place 3 mg into the nose once as needed for up to 1 dose (Hypoglycemia).   Glycerin-Hypromellose-PEG 400  (DRY EYE RELIEF DROPS OP) Place 1 drop into both eyes daily as needed (dry eye).   Insulin Disposable Pump (OMNIPOD 5 G6 INTRO, GEN 5,) KIT 1 each by Does not apply route as needed. (Patient taking differently: 1 each by Does not apply route as needed (Glucose).)   Insulin Disposable Pump (OMNIPOD 5 G6 POD, GEN 5,) MISC 1 each by Does not apply route every 3 (three) days.   Insulin Glargine (BASAGLAR KWIKPEN) 100 UNIT/ML Inject 28 Units into the skin daily.   Insulin Infusion Pump (T:SLIM INSULIN PUMP) DEVI 1 Dose by Does not apply route daily.   insulin lispro (HUMALOG) 100 UNIT/ML injection Use up to 100 units a day in the insulin pump E10.59 (Patient taking differently: Inject 100 Units into the skin See admin instructions. Use up to 100 units a day in the insulin pump E10.59)   levalbuterol (XOPENEX) 1.25 MG/3ML nebulizer solution USE 1 VIAL IN NEBULIZER EVERY 6 HOURS (Patient taking differently: Take 1.25 mg by nebulization every 6 (six) hours as needed for wheezing or shortness of breath.)   loperamide (IMODIUM) 2 MG capsule Take 2  mg by mouth as needed for diarrhea or loose stools.   metoprolol succinate (TOPROL XL) 25 MG 24 hr tablet Take 1 tablet (25 mg total) by mouth daily.   montelukast (SINGULAIR) 10 MG tablet TAKE 1 TABLET BY MOUTH AT BEDTIME   nicotine (NICODERM CQ - DOSED IN MG/24 HR) 7 mg/24hr patch Place 7 mg onto the skin daily.   nitroGLYCERIN (NITROSTAT) 0.4 MG SL tablet Place 1 tablet (0.4 mg total) under the tongue every 5 (five) minutes as needed for chest pain. MAX 3 tab / 15 min   pantoprazole (PROTONIX) 40 MG tablet Take 1 tablet (40 mg total) by mouth daily.   rizatriptan (MAXALT) 5 MG tablet Take 1 tablet (5 mg total) by mouth as needed for migraine. May repeat in 2 hours if needed   triamcinolone cream (KENALOG) 0.1 % Apply 1 application topically 2 (two) times daily. (Patient taking differently: Apply 1 application  topically daily as needed (itching).)     Allergies:    Amoxicillin, Penicillins, and Prednisone   Social History   Socioeconomic History   Marital status: Widowed    Spouse name: Not on file   Number of children: 2   Years of education: Not on file   Highest education level: Not on file  Occupational History   Occupation: Disabled  Tobacco Use   Smoking status: Every Day    Packs/day: .25    Types: Cigarettes   Smokeless tobacco: Never   Tobacco comments:    2-3 cigarettes per day  Vaping Use   Vaping Use: Never used  Substance and Sexual Activity   Alcohol use: Not Currently    Comment: occasional   Drug use: No   Sexual activity: Yes    Comment: tubal ligation  Other Topics Concern   Not on file  Social History Narrative   Not on file   Social Determinants of Health   Financial Resource Strain: High Risk (10/02/2018)   Overall Financial Resource Strain (CARDIA)    Difficulty of Paying Living Expenses: Hard  Food Insecurity: Food Insecurity Present (10/02/2018)   Hunger Vital Sign    Worried About Running Out of Food in the Last Year: Often true    Ran Out of Food in the Last Year: Sometimes true  Transportation Needs: Unmet Transportation Needs (10/02/2018)   PRAPARE - Administrator, Civil Service (Medical): Yes    Lack of Transportation (Non-Medical): Yes  Physical Activity: Insufficiently Active (10/02/2018)   Exercise Vital Sign    Days of Exercise per Week: 1 day    Minutes of Exercise per Session: 30 min  Stress: Stress Concern Present (10/02/2018)   [employer] of Occupational Health - Occupational Stress Questionnaire    Feeling of Stress : Very much  Social Connections: Socially Isolated (10/02/2018)   Social Connection and Isolation Panel [NHANES]    Frequency of Communication with Friends and Family: More than three times a week    Frequency of Social Gatherings with Friends and Family: More than three times a week    Attends Religious Services: Never    Database administrator or Organizations:  No    Attends Banker Meetings: Never    Marital Status: Widowed     Family History: The patient's family history includes COPD in her paternal grandmother; Diabetes in her maternal grandmother and sister; Heart attack (age of onset: 65) in her brother; Heart attack (age of onset: 44) in her father; Heart disease in  her brother. ROS:   Please see the history of present illness.    All 14 point review of systems negative except as described per history of present illness  EKGs/Labs/Other Studies Reviewed:      Recent Labs: 03/01/2022: BUN 7; Creatinine, Ser 0.61; Hemoglobin 13.9; Platelets 270; Potassium 4.6; Sodium 140  Recent Lipid Panel    Component Value Date/Time   CHOL 106 08/06/2021 0844   CHOL 100 06/19/2020 0844   TRIG 101.0 08/06/2021 0844   HDL 39.40 08/06/2021 0844   HDL 40 06/19/2020 0844   CHOLHDL 3 08/06/2021 0844   VLDL 20.2 08/06/2021 0844   LDLCALC 46 08/06/2021 0844   LDLCALC 46 06/19/2020 0844    Physical Exam:    VS:  BP 114/66 (BP Location: Left Arm, Patient Position: Sitting)   Pulse 95   Ht 5\' 1"  (1.549 m)   Wt 137 lb 12.8 oz (62.5 kg)   SpO2 97%   BMI 26.04 kg/m     Wt Readings from Last 3 Encounters:  07/05/22 137 lb 12.8 oz (62.5 kg)  05/30/22 133 lb 9.6 oz (60.6 kg)  03/05/22 136 lb (61.7 kg)     GEN:  Well nourished, well developed in no acute distress HEENT: Normal NECK: No JVD; No carotid bruits LYMPHATICS: No lymphadenopathy CARDIAC: RRR, no murmurs, no rubs, no gallops RESPIRATORY:  Clear to auscultation without rales, wheezing or rhonchi  ABDOMEN: Soft, non-tender, non-distended MUSCULOSKELETAL:  No edema; No deformity  SKIN: Warm and dry LOWER EXTREMITIES: no swelling NEUROLOGIC:  Alert and oriented x 3 PSYCHIATRIC:  Normal affect   ASSESSMENT:    1. Coronary artery disease involving native coronary artery of native heart with angina pectoris (HCC)   2. Type 1 diabetes mellitus with other circulatory  complication (HCC)   3. Bipolar 1 disorder (HCC)   4. Hyperlipidemia LDL goal <70   5. Tobacco use disorder    PLAN:    In order of problems listed above:  Coronary artery disease.  Status post PTCA and stenting in 2021.  Marginal branch with recent cardiac catheterization showing nonobstructive lesion.  I prefer to continue dual antiplatelet therapy because of multiple risk factors and the fact that she tolerated this therapy quite well.  She is asymptomatic right now. Type 1 diabetes she is wearing CGM as well as insulin pump. Bipolar disorder that being managed by internal medicine team. Hyperlipidemia she is taking high intense statin for of Lipitor 80 I did review her K PN which show me her LDL of 46, HDL 39.4.  Will continue present management. Smoking likely she quit just few days ago I congratulated her for it and encouraged her to stay away from smoking.   Medication Adjustments/Labs and Tests Ordered: Current medicines are reviewed at length with the patient today.  Concerns regarding medicines are outlined above.  No orders of the defined types were placed in this encounter.  Medication changes: No orders of the defined types were placed in this encounter.   Signed, Georgeanna Lea, MD, [facility] 07/05/2022 4:44 PM    [facility]

## 2022-07-17 ENCOUNTER — Encounter: Payer: Self-pay | Admitting: Internal Medicine

## 2022-07-20 ENCOUNTER — Other Ambulatory Visit: Payer: Self-pay | Admitting: Cardiology

## 2022-07-23 ENCOUNTER — Encounter: Payer: Self-pay | Admitting: Cardiology

## 2022-07-23 ENCOUNTER — Other Ambulatory Visit: Payer: Self-pay

## 2022-07-23 MED ORDER — METOPROLOL SUCCINATE ER 25 MG PO TB24: 25.0000 mg | ORAL_TABLET | Freq: Every day | ORAL | 3 refills | Status: DC

## 2022-07-23 NOTE — Telephone Encounter (Signed)
Rx refill sent to pharmacy. 

## 2022-07-24 ENCOUNTER — Encounter: Payer: Self-pay | Admitting: Nutrition

## 2022-07-25 ENCOUNTER — Telehealth: Payer: Self-pay | Admitting: Dietician

## 2022-07-25 ENCOUNTER — Ambulatory Visit: Payer: 59 | Admitting: Pulmonary Disease

## 2022-07-25 ENCOUNTER — Encounter: Payer: Self-pay | Admitting: Internal Medicine

## 2022-07-25 ENCOUNTER — Telehealth: Payer: Self-pay | Admitting: Pulmonary Disease

## 2022-07-25 ENCOUNTER — Ambulatory Visit (INDEPENDENT_AMBULATORY_CARE_PROVIDER_SITE_OTHER): Payer: 59 | Admitting: Pulmonary Disease

## 2022-07-25 DIAGNOSIS — J4540 Moderate persistent asthma, uncomplicated: Secondary | ICD-10-CM

## 2022-07-25 LAB — PULMONARY FUNCTION TEST
DL/VA % pred: 74 %
DL/VA: 3.37 ml/min/mmHg/L
DLCO cor % pred: 77 %
DLCO cor: 15.78 ml/min/mmHg
DLCO unc % pred: 77 %
DLCO unc: 15.87 ml/min/mmHg
FEF 25-75 Post: 2.43 L/sec
FEF 25-75 Pre: 2.71 L/sec
FEF2575-%Change-Post: -10 %
FEF2575-%Pred-Post: 80 %
FEF2575-%Pred-Pre: 89 %
FEV1-%Change-Post: -2 %
FEV1-%Pred-Post: 95 %
FEV1-%Pred-Pre: 97 %
FEV1-Post: 2.69 L
FEV1-Pre: 2.74 L
FEV1FVC-%Change-Post: 0 %
FEV1FVC-%Pred-Pre: 97 %
FEV6-%Change-Post: -2 %
FEV6-%Pred-Post: 97 %
FEV6-%Pred-Pre: 100 %
FEV6-Post: 3.32 L
FEV6-Pre: 3.41 L
FEV6FVC-%Change-Post: 0 %
FEV6FVC-%Pred-Post: 101 %
FEV6FVC-%Pred-Pre: 101 %
FVC-%Change-Post: -2 %
FVC-%Pred-Post: 96 %
FVC-%Pred-Pre: 99 %
FVC-Post: 3.35 L
FVC-Pre: 3.43 L
Post FEV1/FVC ratio: 80 %
Post FEV6/FVC ratio: 100 %
Pre FEV1/FVC ratio: 80 %
Pre FEV6/FVC Ratio: 100 %
RV % pred: 54 %
RV: 0.82 L
TLC % pred: 87 %
TLC: 4.18 L

## 2022-07-25 MED ORDER — MONTELUKAST SODIUM 10 MG PO TABS: 10.0000 mg | ORAL_TABLET | Freq: Every day | ORAL | 1 refills | Status: DC

## 2022-07-25 MED ORDER — TRELEGY ELLIPTA 100-62.5-25 MCG/ACT IN AEPB
1.0000 | INHALATION_SPRAY | Freq: Every day | RESPIRATORY_TRACT | 2 refills | Status: DC
Start: 2022-07-25 — End: 2022-12-04

## 2022-07-25 NOTE — Telephone Encounter (Signed)
Needs Trelegy & Singulair refills  [facility] in [place]  Coming in today for PFT. Can not be seen by [facility] until 30 days after upcoming surgery. Can we still fill w/no problem?

## 2022-07-25 NOTE — Patient Instructions (Signed)
Full PFT performed today. °

## 2022-07-25 NOTE — Telephone Encounter (Signed)
Returned patient call.  She received a message from [place], our pump trainer about updating her pump settings but she states that she was unable to do so as [employer] support line did not know how to teach her on settings provided.    Talked patient through reviewing her current pump settings.  They were all correct except for the I:C.  This was changed from 1:17 to 1:13.  She states that she changed from the Omnipod to the T:slim yesterday and her blood glucose has been 300-400 since.  She stated that her tubing is kinked and is concerned that this may be an issue.  Discussed that she should change her site and see if this helps.  Patient to call for further questions. She wishes for our educator to call her next week.  Oran Rein, RD, LDN, CDCES

## 2022-07-25 NOTE — Telephone Encounter (Signed)
Spoke with patient with hallway. I advised her that I would go ahead and send in her refills for her. She verbalized understanding.   Nothing further needed at time of call.

## 2022-07-25 NOTE — Progress Notes (Signed)
Full PFT performed today. °

## 2022-07-30 NOTE — Telephone Encounter (Signed)
LVM to call me back

## 2022-08-07 ENCOUNTER — Telehealth: Payer: Self-pay | Admitting: Nutrition

## 2022-08-07 NOTE — Telephone Encounter (Signed)
LVM to call me and let me know if she got everything into the pump, and how her blood sugars are doing.

## 2022-08-10 ENCOUNTER — Other Ambulatory Visit: Payer: Self-pay | Admitting: Family Medicine

## 2022-08-10 DIAGNOSIS — J309 Allergic rhinitis, unspecified: Secondary | ICD-10-CM

## 2022-08-27 ENCOUNTER — Telehealth: Payer: Self-pay | Admitting: Nutrition

## 2022-08-27 NOTE — Telephone Encounter (Signed)
LVM to call me to let me know how she is doing with her pump.

## 2022-08-27 NOTE — Telephone Encounter (Signed)
LVM to call me back

## 2022-09-02 ENCOUNTER — Other Ambulatory Visit: Payer: Self-pay | Admitting: Family Medicine

## 2022-09-02 DIAGNOSIS — J309 Allergic rhinitis, unspecified: Secondary | ICD-10-CM

## 2022-09-19 ENCOUNTER — Ambulatory Visit: Payer: 59 | Admitting: Pulmonary Disease

## 2022-09-19 ENCOUNTER — Encounter: Payer: Self-pay | Admitting: Pulmonary Disease

## 2022-09-19 VITALS — BP 100/68 | HR 90 | Temp 97.7°F | Ht 64.0 in | Wt 141.2 lb

## 2022-09-19 DIAGNOSIS — J4540 Moderate persistent asthma, uncomplicated: Secondary | ICD-10-CM

## 2022-09-19 DIAGNOSIS — F17210 Nicotine dependence, cigarettes, uncomplicated: Secondary | ICD-10-CM | POA: Diagnosis not present

## 2022-09-19 NOTE — Patient Instructions (Addendum)
Continue trelegy ellipta 1 puff daily - rinse mouth out after each use  Use albuterol 1-2 puffs every 6 hours as needed  Recommend using 7mg  nicotine patch per day along with mini nicotine lozenges as needed to continue working on quitting smoking     Follow up in 6 months

## 2022-09-19 NOTE — Progress Notes (Signed)
Synopsis: Referred in January 2023 for dyspnea by Herbie Drape, MD  Subjective:   PATIENT ID: Melody Myers GENDER: female DOB: [birth year], MRN: [id number]  HPI  Chief Complaint  Patient presents with   Follow-up    Breathing is unchanged since the last visit. She has not used her albuterol bc she says the pharmacist told her not to use this with trelegy.   Melody Myers is a 42 year old woman, daily smoker with history of asthma, MI s/p stent placement in 2020 and covid infections x 2 who returns to pulmonary clinic with cough and dyspnea.   She had quit smoking for 2 months, but returned to smoking a few cigarettes per day. She used the patches to quit and is using patches intermittently.  She reports intermittent chest tightness and exertional shortness of breath. She does report anxiety and panic issues.   PFTs 5/24 are within normal limits.   OV 12/30/21 She continues on trelegy 1 puff daily. She does notice if she forgets to use the trelegy that she feels worse and does not sleep as well. She is using albuterol inhaler 1 to 2 times per day on average. She is smoking 2-3 cigarettes per day now.   OV 05/07/21 She was unable to complete pulmonary function testing today due to shortness of breath and cough.  She continues to smoke 3 to 4 cigarettes daily.  She was started on Trelegy Ellipta 1 puff daily at last visit with improvement in her shortness of breath.  She is using albuterol 1-2 times per week.  She continues to take Singulair daily.  She is not using other allergy medicine at this time.  She is using Flonase 2 sprays per nostril daily.  OV 03/02/21 She reports having cough and dyspnea prior to covid in 2021 and 01/2021, but after the infections her symptoms have worsened.   She is currently using breo ellipta 1 puff daily and advair diskus 250-54mcg 1-2 puffs per day as needed. She is also using albuterol inhaler and nebulizer treatments as needed.   She continues to smoke  3-5 cigarettes per day and is using e-cigarette daily. An e-cigarette will last her 2-3 weeks. She started smoking at age 32. She was previously smoking 2 packs per day. She quit smoking for 6 months in 2020 after suffering MI. She lives with her sister who smokes. She grew up with significant second hand smoke exposure.   Past Medical History:  Diagnosis Date   Anal sphincter tear 01/22/2022   Anxiety    Asthma    Bipolar 1 disorder (HCC)    Bipolar depression (HCC) 03/20/2013   Carpal tunnel syndrome 02/04/2013   Formatting of this note might be different from the original. IMPRESSION: Order right CTI 40981- ASAP   Chest pain 12/24/2018   Chronic pain 03/20/2013   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   Formatting of this note might be different from the original. Repaired at [facility] in Sept 2016.  Pt states that all problems are still present.  If followed by [facility] for this. She has f/u appt.  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 10/14/2018   Degenerative disc disease, lumbar 06/05/2015   Formatting of this note might be different from the original. L4-5 > L3-4, mild   Depression    patient reports bipolor   Depression    Diabetic neuropathy (HCC)    DKA (diabetic ketoacidoses) 03/19/2013  History of adenomatous polyp of colon 04/17/2015   Formatting of this note might be different from the original. Per path 2016   History of anxiety 04/17/2015   History of non-ST elevation myocardial infarction (NSTEMI) 04/30/2021   Hyperlipidemia LDL goal <70 10/04/2018   Hypokalemia 11/15/2014   Ingrown toenail of both feet 04/12/2019   Formatting of this note might be different from the original. great toenails bilateral   Insulin dependent diabetes mellitus with complications    insulin dependant   Jaw pain 02/24/2020   Keratoconjunctivitis sicca of both eyes not specified as Sjogren's 09/24/2016    Leukocytes in urine 06/08/2017   Leukocytosis 03/20/2013   Mild diastolic dysfunction 10/27/2020   Myocardial infarction (HCC) 10/02/2018   Myopia of both eyes 09/24/2016   Neuropathy    Non-ST elevation (NSTEMI) myocardial infarction (HCC) 10/02/2018   NSTEMI (non-ST elevated myocardial infarction) (HCC) 10/02/2018   Spontaneous bruising 08/26/2018   SUI (stress urinary incontinence, female) 12/11/2016   Superficial incisional surgical site infection 11/14/2014   Syncopal episodes    Tobacco abuse 03/20/2013   Urinary incontinence 06/08/2017   Urinary retention 11/14/2014   Vitreous floater, bilateral 09/24/2016     Family History  Problem Relation Age of Onset   Heart attack Father 15   Diabetes Sister    Heart attack Brother 29   Heart disease Brother    Diabetes Maternal Grandmother    COPD Paternal Grandmother      Social History   Socioeconomic History   Marital status: Widowed    Spouse name: Not on file   Number of children: 2   Years of education: Not on file   Highest education level: Not on file  Occupational History   Occupation: Disabled  Tobacco Use   Smoking status: Every Day    Current packs/day: 0.25    Types: Cigarettes   Smokeless tobacco: Never   Tobacco comments:    2-3 cigarettes per day  Vaping Use   Vaping status: Never Used  Substance and Sexual Activity   Alcohol use: Not Currently    Comment: occasional   Drug use: No   Sexual activity: Yes    Comment: tubal ligation  Other Topics Concern   Not on file  Social History Narrative   Not on file   Social Determinants of Health   Financial Resource Strain: High Risk (10/02/2018)   Overall Financial Resource Strain (CARDIA)    Difficulty of Paying Living Expenses: Hard  Food Insecurity: Food Insecurity Present (10/02/2018)   Hunger Vital Sign    Worried About Running Out of Food in the Last Year: Often true    Ran Out of Food in the Last Year: Sometimes true  Transportation Needs:  Unmet Transportation Needs (10/02/2018)   PRAPARE - Transportation    Lack of Transportation (Medical): Yes    Lack of Transportation (Non-Medical): Yes  Physical Activity: Insufficiently Active (10/02/2018)   Exercise Vital Sign    Days of Exercise per Week: 1 day    Minutes of Exercise per Session: 30 min  Stress: Stress Concern Present (10/02/2018)   [employer] of Occupational Health - Occupational Stress Questionnaire    Feeling of Stress : Very much  Social Connections: Unknown (06/26/2021)   Received [facility], [facility]   Social Network    Social Network: Not on file  Intimate Partner Violence: Unknown (05/30/2021)   Received from [facility]   HITS    Physically Hurt: Not on  file    Insult or Talk Down To: Not on file    Threaten Physical Harm: Not on file    Scream or Curse: Not on file     Allergies  Allergen Reactions   Amoxicillin Itching   Penicillins Hives, Itching and Swelling    Did it involve swelling of the face/tongue/throat, SOB, or low BP? No Did it involve sudden or severe rash/hives, skin peeling, or any reaction on the inside of your mouth or nose? No Did you need to seek medical attention at a hospital or doctor's office? No When did it last happen?      Childhood If all above answers are "NO", may proceed with cephalosporin use.   Prednisone Itching and Swelling     Outpatient Medications Prior to Visit  Medication Sig Dispense Refill   acetaminophen (TYLENOL) 325 MG tablet Take 650 mg by mouth every 6 (six) hours as needed for moderate pain or headache.      aspirin EC 81 MG EC tablet Take 1 tablet (81 mg total) by mouth daily.     atorvastatin (LIPITOR) 80 MG tablet Take 1 tablet (80 mg total) by mouth daily. 90 tablet 3   clopidogrel (PLAVIX) 75 MG tablet Take 1 tablet by mouth once daily 90 tablet 3   Continuous Blood Gluc Sensor (DEXCOM G7 SENSOR) MISC 3 each by Does not apply route every 30 (thirty) days. Apply 1  sensor every 10 days E10.59 9 each 4   Continuous Blood Gluc Transmit (DEXCOM G6 TRANSMITTER) MISC 1 Device by Does not apply route every 3 (three) months. 1 each 3   fluticasone (FLONASE) 50 MCG/ACT nasal spray Place 2 sprays into both nostrils daily. 2 sprays each nostril at night (Patient taking differently: Place 2 sprays into both nostrils at bedtime.) 16 g 6   Fluticasone-Umeclidin-Vilant (TRELEGY ELLIPTA) 100-62.5-25 MCG/ACT AEPB Take 1 puff by mouth daily. INHALE 1 PUFF INTO LUNGS ONCE DAILY 60 each 2   Glucagon 3 MG/DOSE POWD Place 3 mg into the nose once as needed for up to 1 dose (Hypoglycemia).     Glycerin-Hypromellose-PEG 400 (DRY EYE RELIEF DROPS OP) Place 1 drop into both eyes daily as needed (dry eye).     Insulin Disposable Pump (OMNIPOD 5 G6 INTRO, GEN 5,) KIT 1 each by Does not apply route as needed. (Patient taking differently: 1 each by Does not apply route as needed (Glucose).) 1 kit 0   Insulin Disposable Pump (OMNIPOD 5 G6 POD, GEN 5,) MISC 1 each by Does not apply route every 3 (three) days. 30 each 3   Insulin Glargine (BASAGLAR KWIKPEN) 100 UNIT/ML Inject 28 Units into the skin daily. 15 mL 1   Insulin Infusion Pump (T:SLIM INSULIN PUMP) DEVI 1 Dose by Does not apply route daily.     insulin lispro (HUMALOG) 100 UNIT/ML injection Use up to 100 units a day in the insulin pump E10.59 (Patient taking differently: Inject 100 Units into the skin See admin instructions. Use up to 100 units a day in the insulin pump E10.59) 90 mL 3   loperamide (IMODIUM) 2 MG capsule Take 2 mg by mouth as needed for diarrhea or loose stools.     metoprolol succinate (TOPROL XL) 25 MG 24 hr tablet Take 1 tablet (25 mg total) by mouth daily. 90 tablet 3   montelukast (SINGULAIR) 10 MG tablet Take 1 tablet (10 mg total) by mouth at bedtime. 90 tablet 1   nicotine (NICODERM CQ - DOSED  IN MG/24 HR) 7 mg/24hr patch Place 7 mg onto the skin daily.     nitroGLYCERIN (NITROSTAT) 0.4 MG SL tablet Place 1  tablet (0.4 mg total) under the tongue every 5 (five) minutes as needed for chest pain. MAX 3 tab / 15 min 25 tablet 10   pantoprazole (PROTONIX) 40 MG tablet Take 1 tablet (40 mg total) by mouth daily. 90 tablet 3   triamcinolone cream (KENALOG) 0.1 % Apply 1 application topically 2 (two) times daily. (Patient taking differently: Apply 1 application  topically daily as needed (itching).) 30 g 0   albuterol (VENTOLIN HFA) 108 (90 Base) MCG/ACT inhaler Inhale 2 puffs into the lungs every 6 (six) hours as needed for wheezing or shortness of breath. (Patient not taking: Reported on 09/19/2022) 8 g 1   rizatriptan (MAXALT) 5 MG tablet Take 1 tablet (5 mg total) by mouth as needed for migraine. May repeat in 2 hours if needed (Patient not taking: Reported on 09/19/2022) 10 tablet 2   levalbuterol (XOPENEX) 1.25 MG/3ML nebulizer solution USE 1 VIAL IN NEBULIZER EVERY 6 HOURS (Patient taking differently: Take 1.25 mg by nebulization every 6 (six) hours as needed for wheezing or shortness of breath.) 75 mL 0   No facility-administered medications prior to visit.   Review of Systems  Constitutional:  Negative for chills, fever, malaise/fatigue and weight loss.  HENT:  Negative for congestion, sinus pain and sore throat.   Eyes: Negative.   Respiratory:  Positive for shortness of breath. Negative for cough, hemoptysis, sputum production and wheezing.   Cardiovascular:  Positive for chest pain (tightness). Negative for palpitations, orthopnea, claudication and leg swelling.  Gastrointestinal:  Negative for abdominal pain, heartburn, nausea and vomiting.  Genitourinary: Negative.   Musculoskeletal:  Negative for joint pain and myalgias.  Skin:  Negative for rash.  Neurological:  Negative for weakness.  Psychiatric/Behavioral: Negative.      Objective:   Vitals:   09/19/22 0903  BP: 100/68  Pulse: 90  Temp: 97.7 F (36.5 C)  TempSrc: Oral  SpO2: 97%  Weight: 141 lb 3.2 oz (64 kg)  Height: 5\' 4"   (1.626 m)   Physical Exam Constitutional:      General: She is not in acute distress.    Appearance: She is not ill-appearing.  HENT:     Head: Normocephalic and atraumatic.  Eyes:     Conjunctiva/sclera: Conjunctivae normal.  Cardiovascular:     Rate and Rhythm: Normal rate and regular rhythm.     Pulses: Normal pulses.     Heart sounds: Normal heart sounds. No murmur heard. Pulmonary:     Effort: Pulmonary effort is normal.     Breath sounds: Decreased air movement present. No wheezing, rhonchi or rales.  Musculoskeletal:     Right lower leg: No edema.     Left lower leg: No edema.  Skin:    General: Skin is warm and dry.  Neurological:     Mental Status: She is alert.  Psychiatric:        Thought Content: Thought content normal.    CBC    Component Value Date/Time   WBC 7.8 03/01/2022 0905   WBC 7.9 12/18/2021 1247   RBC 4.74 03/01/2022 0905   RBC 4.71 12/18/2021 1247   HGB 13.9 03/01/2022 0905   HCT 41.6 03/01/2022 0905   PLT 270 03/01/2022 0905   MCV 88 03/01/2022 0905   MCH 29.3 03/01/2022 0905   MCH 30.4 12/18/2021 1247   MCHC  33.4 03/01/2022 0905   MCHC 33.9 12/18/2021 1247   RDW 12.2 03/01/2022 0905   LYMPHSABS 2.4 01/05/2021 1655   MONOABS 0.4 01/05/2021 1655   EOSABS 0.1 01/05/2021 1655   BASOSABS 0.1 01/05/2021 1655      Latest Ref Rng & Units 06/17/2022    8:54 AM 03/01/2022    9:05 AM 12/18/2021   12:47 PM  BMP  Glucose 65 - 99 mg/dL 253  664  403   BUN 6 - 20 mg/dL  7  8   Creatinine 4.74 - 1.00 mg/dL  2.59  5.63   BUN/Creat Ratio 9 - 23  11    Sodium 134 - 144 mmol/L  140  137   Potassium 3.5 - 5.2 mmol/L  4.6  4.6   Chloride 96 - 106 mmol/L  103  105   CO2 20 - 29 mmol/L  25  23   Calcium 8.7 - 10.2 mg/dL  9.5  9.0    Chest imaging: CXR 12/18/21 The heart size and mediastinal contours are within normal limits. Both lungs are clear. The visualized skeletal structures are unremarkable.  CTA Chest 01/08/21 Cardiovascular: Satisfactory  opacification of the pulmonary arteries to the segmental level. No evidence of pulmonary embolism. Thoracic aorta is normal in course and caliber. Normal heart size. No pericardial effusion.   Mediastinum/Nodes: No enlarged mediastinal, hilar, or axillary lymph nodes. Thyroid gland, trachea, and esophagus demonstrate no significant findings.   Lungs/Pleura: No focal airspace consolidation. No pleural effusion or pneumothorax.  CT Chest 2021 Mild apical predominant emphysema.  PFT:    Latest Ref Rng & Units 07/25/2022   10:52 AM 05/07/2021    1:51 PM  PFT Results  FVC-Pre L 3.43    FVC-Predicted Pre % 99    FVC-Post L 3.35    FVC-Predicted Post % 96    Pre FEV1/FVC % % 80    Post FEV1/FCV % % 80    FEV1-Pre L 2.74    FEV1-Predicted Pre % 97    FEV1-Post L 2.69    DLCO uncorrected ml/min/mmHg 15.87  15.34   DLCO UNC% % 77  69   DLCO corrected ml/min/mmHg 15.78  15.34   DLCO COR %Predicted % 77  69   DLVA Predicted % 74  75   TLC L 4.18  8.09   TLC % Predicted % 87  160   RV % Predicted % 54  305    Labs:  Path:  Echo 10/17/20: LV EF 60-65%. Grade I diastolic dysfunction. RV systolic function is normal.   Heart Catheterization:  Assessment & Plan:   No diagnosis found.  Discussion: Noelani Harbach is a 42 year old woman, daily smoker with history of asthma, MI s/p stent placement in 2020 and covid infections x 2 who returns to pulmonary clinic for asthma.  She has moderate persistent asthma with evidence of apical emphysema on CT chest scans.  Her pulmonary function tests are within normal limits.  She is to continue on trelegy ellipta inhaler 1 puff daily and as needed albuterol.  I believe once she has fully quit smoking she will be less symptomatic and we can de-escalate her inhaler therapy.  She is to continue montelukast and Flonase for allergies.  I have instructed her that she can use Allegra or Zyrtec as needed for further allergy treatment.  She is to  continue working on smoking cessation with nicotine replacement therapy with 7mg  daily nicotine patch and mini nicotine lozenges as needed.  Follow-up in 6 months.  Melody Comas, MD [facility] Office: [phone]    Current Outpatient Medications:    acetaminophen (TYLENOL) 325 MG tablet, Take 650 mg by mouth every 6 (six) hours as needed for moderate pain or headache. , Disp: , Rfl:    aspirin EC 81 MG EC tablet, Take 1 tablet (81 mg total) by mouth daily., Disp:  , Rfl:    atorvastatin (LIPITOR) 80 MG tablet, Take 1 tablet (80 mg total) by mouth daily., Disp: 90 tablet, Rfl: 3   clopidogrel (PLAVIX) 75 MG tablet, Take 1 tablet by mouth once daily, Disp: 90 tablet, Rfl: 3   Continuous Blood Gluc Sensor (DEXCOM G7 SENSOR) MISC, 3 each by Does not apply route every 30 (thirty) days. Apply 1 sensor every 10 days E10.59, Disp: 9 each, Rfl: 4   Continuous Blood Gluc Transmit (DEXCOM G6 TRANSMITTER) MISC, 1 Device by Does not apply route every 3 (three) months., Disp: 1 each, Rfl: 3   fluticasone (FLONASE) 50 MCG/ACT nasal spray, Place 2 sprays into both nostrils daily. 2 sprays each nostril at night (Patient taking differently: Place 2 sprays into both nostrils at bedtime.), Disp: 16 g, Rfl: 6   Fluticasone-Umeclidin-Vilant (TRELEGY ELLIPTA) 100-62.5-25 MCG/ACT AEPB, Take 1 puff by mouth daily. INHALE 1 PUFF INTO LUNGS ONCE DAILY, Disp: 60 each, Rfl: 2   Glucagon 3 MG/DOSE POWD, Place 3 mg into the nose once as needed for up to 1 dose (Hypoglycemia)., Disp: , Rfl:    Glycerin-Hypromellose-PEG 400 (DRY EYE RELIEF DROPS OP), Place 1 drop into both eyes daily as needed (dry eye)., Disp: , Rfl:    Insulin Disposable Pump (OMNIPOD 5 G6 INTRO, GEN 5,) KIT, 1 each by Does not apply route as needed. (Patient taking differently: 1 each by Does not apply route as needed (Glucose).), Disp: 1 kit, Rfl: 0   Insulin Disposable Pump (OMNIPOD 5 G6 POD, GEN 5,) MISC, 1 each by Does not  apply route every 3 (three) days., Disp: 30 each, Rfl: 3   Insulin Glargine (BASAGLAR KWIKPEN) 100 UNIT/ML, Inject 28 Units into the skin daily., Disp: 15 mL, Rfl: 1   Insulin Infusion Pump (T:SLIM INSULIN PUMP) DEVI, 1 Dose by Does not apply route daily., Disp: , Rfl:    insulin lispro (HUMALOG) 100 UNIT/ML injection, Use up to 100 units a day in the insulin pump E10.59 (Patient taking differently: Inject 100 Units into the skin See admin instructions. Use up to 100 units a day in the insulin pump E10.59), Disp: 90 mL, Rfl: 3   loperamide (IMODIUM) 2 MG capsule, Take 2 mg by mouth as needed for diarrhea or loose stools., Disp: , Rfl:    metoprolol succinate (TOPROL XL) 25 MG 24 hr tablet, Take 1 tablet (25 mg total) by mouth daily., Disp: 90 tablet, Rfl: 3   montelukast (SINGULAIR) 10 MG tablet, Take 1 tablet (10 mg total) by mouth at bedtime., Disp: 90 tablet, Rfl: 1   nicotine (NICODERM CQ - DOSED IN MG/24 HR) 7 mg/24hr patch, Place 7 mg onto the skin daily., Disp: , Rfl:    nitroGLYCERIN (NITROSTAT) 0.4 MG SL tablet, Place 1 tablet (0.4 mg total) under the tongue every 5 (five) minutes as needed for chest pain. MAX 3 tab / 15 min, Disp: 25 tablet, Rfl: 10   pantoprazole (PROTONIX) 40 MG tablet, Take 1 tablet (40 mg total) by mouth daily., Disp: 90 tablet, Rfl: 3   triamcinolone cream (KENALOG) 0.1 %,  Apply 1 application topically 2 (two) times daily. (Patient taking differently: Apply 1 application  topically daily as needed (itching).), Disp: 30 g, Rfl: 0   albuterol (VENTOLIN HFA) 108 (90 Base) MCG/ACT inhaler, Inhale 2 puffs into the lungs every 6 (six) hours as needed for wheezing or shortness of breath. (Patient not taking: Reported on 09/19/2022), Disp: 8 g, Rfl: 1   rizatriptan (MAXALT) 5 MG tablet, Take 1 tablet (5 mg total) by mouth as needed for migraine. May repeat in 2 hours if needed (Patient not taking: Reported on 09/19/2022), Disp: 10 tablet, Rfl: 2

## 2022-09-22 ENCOUNTER — Encounter: Payer: Self-pay | Admitting: Pulmonary Disease

## 2022-10-16 DIAGNOSIS — F4001 Agoraphobia with panic disorder: Secondary | ICD-10-CM | POA: Insufficient documentation

## 2022-10-16 DIAGNOSIS — F411 Generalized anxiety disorder: Secondary | ICD-10-CM | POA: Insufficient documentation

## 2022-10-17 ENCOUNTER — Other Ambulatory Visit: Payer: Self-pay

## 2022-10-17 ENCOUNTER — Emergency Department (HOSPITAL_COMMUNITY): Payer: 59

## 2022-10-17 ENCOUNTER — Emergency Department (HOSPITAL_COMMUNITY)
Admission: EM | Admit: 2022-10-17 | Discharge: 2022-10-18 | Disposition: A | Discharge status: Home / Self Care | Payer: 59 | Source: Home / Self Care | Attending: Emergency Medicine | Admitting: Emergency Medicine

## 2022-10-17 ENCOUNTER — Encounter (HOSPITAL_COMMUNITY): Payer: Self-pay

## 2022-10-17 DIAGNOSIS — Z794 Long term (current) use of insulin: Secondary | ICD-10-CM | POA: Diagnosis not present

## 2022-10-17 DIAGNOSIS — R339 Retention of urine, unspecified: Secondary | ICD-10-CM | POA: Insufficient documentation

## 2022-10-17 DIAGNOSIS — R1031 Right lower quadrant pain: Secondary | ICD-10-CM | POA: Diagnosis not present

## 2022-10-17 DIAGNOSIS — J45909 Unspecified asthma, uncomplicated: Secondary | ICD-10-CM | POA: Diagnosis not present

## 2022-10-17 DIAGNOSIS — D72829 Elevated white blood cell count, unspecified: Secondary | ICD-10-CM | POA: Insufficient documentation

## 2022-10-17 DIAGNOSIS — R338 Other retention of urine: Secondary | ICD-10-CM

## 2022-10-17 DIAGNOSIS — Z7951 Long term (current) use of inhaled steroids: Secondary | ICD-10-CM | POA: Insufficient documentation

## 2022-10-17 DIAGNOSIS — R319 Hematuria, unspecified: Secondary | ICD-10-CM | POA: Insufficient documentation

## 2022-10-17 DIAGNOSIS — Z7902 Long term (current) use of antithrombotics/antiplatelets: Secondary | ICD-10-CM | POA: Diagnosis not present

## 2022-10-17 DIAGNOSIS — Z7982 Long term (current) use of aspirin: Secondary | ICD-10-CM | POA: Diagnosis not present

## 2022-10-17 DIAGNOSIS — G8918 Other acute postprocedural pain: Secondary | ICD-10-CM | POA: Insufficient documentation

## 2022-10-17 DIAGNOSIS — R109 Unspecified abdominal pain: Secondary | ICD-10-CM | POA: Diagnosis present

## 2022-10-17 DIAGNOSIS — E119 Type 2 diabetes mellitus without complications: Secondary | ICD-10-CM | POA: Diagnosis not present

## 2022-10-17 LAB — CBC
HCT: 38.2 % (ref 36.0–46.0)
Hemoglobin: 12.8 g/dL (ref 12.0–15.0)
MCH: 29.6 pg (ref 26.0–34.0)
MCHC: 33.5 g/dL (ref 30.0–36.0)
MCV: 88.4 fL (ref 80.0–100.0)
Platelets: 235 10*3/uL (ref 150–400)
RBC: 4.32 MIL/uL (ref 3.87–5.11)
RDW: 13.2 % (ref 11.5–15.5)
WBC: 20.1 10*3/uL — ABNORMAL HIGH (ref 4.0–10.5)
nRBC: 0 % (ref 0.0–0.2)

## 2022-10-17 LAB — COMPREHENSIVE METABOLIC PANEL
ALT: 19 U/L (ref 0–44)
AST: 22 U/L (ref 15–41)
Albumin: 3.8 g/dL (ref 3.5–5.0)
Alkaline Phosphatase: 68 U/L (ref 38–126)
Anion gap: 9 (ref 5–15)
BUN: 7 mg/dL (ref 6–20)
CO2: 25 mmol/L (ref 22–32)
Calcium: 8.4 mg/dL — ABNORMAL LOW (ref 8.9–10.3)
Chloride: 98 mmol/L (ref 98–111)
Creatinine, Ser: 0.61 mg/dL (ref 0.44–1.00)
GFR, Estimated: >60 mL/min (ref >60)
Glucose, Bld: 156 mg/dL — ABNORMAL HIGH (ref 70–99)
Potassium: 3.2 mmol/L — ABNORMAL LOW (ref 3.5–5.1)
Sodium: 132 mmol/L — ABNORMAL LOW (ref 135–145)
Total Bilirubin: 0.7 mg/dL (ref 0.3–1.2)
Total Protein: 6.5 g/dL (ref 6.5–8.1)

## 2022-10-17 LAB — URINALYSIS, ROUTINE W REFLEX MICROSCOPIC
Bacteria, UA: NONE SEEN
Bilirubin Urine: NEGATIVE
Glucose, UA: NEGATIVE mg/dL
Ketones, ur: NEGATIVE mg/dL
Leukocytes,Ua: NEGATIVE
Nitrite: NEGATIVE
Protein, ur: NEGATIVE mg/dL
Specific Gravity, Urine: 1.011 (ref 1.005–1.030)
pH: 7 (ref 5.0–8.0)

## 2022-10-17 LAB — HCG, SERUM, QUALITATIVE: Preg, Serum: NEGATIVE

## 2022-10-17 LAB — LIPASE, BLOOD: Lipase: 20 U/L (ref 11–51)

## 2022-10-17 MED ORDER — IOHEXOL 300 MG/ML  SOLN
100.0000 mL | Freq: Once | INTRAMUSCULAR | Status: AC | PRN
  Administered 2022-10-17: 100 mL via INTRAVENOUS

## 2022-10-17 MED ORDER — ONDANSETRON HCL 4 MG/2ML IJ SOLN
4.0000 mg | Freq: Once | INTRAMUSCULAR | Status: AC
  Administered 2022-10-17: 4 mg via INTRAVENOUS
  Filled 2022-10-17: qty 2

## 2022-10-17 MED ORDER — METOCLOPRAMIDE HCL 5 MG/ML IJ SOLN: 10.0000 mg | Freq: Once | INTRAMUSCULAR | Status: DC

## 2022-10-17 MED ORDER — HYDROMORPHONE HCL 1 MG/ML IJ SOLN
1.0000 mg | Freq: Once | INTRAMUSCULAR | Status: AC
  Administered 2022-10-17: 1 mg via INTRAVENOUS
  Filled 2022-10-17: qty 1

## 2022-10-17 MED ORDER — FENTANYL CITRATE PF 50 MCG/ML IJ SOSY
50.0000 ug | PREFILLED_SYRINGE | INTRAMUSCULAR | Status: DC | PRN
  Administered 2022-10-17: 50 ug via INTRAVENOUS
  Filled 2022-10-17: qty 1

## 2022-10-17 NOTE — Discharge Instructions (Signed)
Thank you for coming to [facility] Emergency Department. You were seen for abdominal pain and inability to urinate after surgery. We did an exam, labs, and imaging, and these showed urinary retention and a Foley catheter was placed.  The CT scan did not demonstrate any acute emergent findings.  Please call your general surgeon in the morning to make a follow-up appointment.  We have also provided a phone number to call for urology for your Foley catheter.  Please take your previously prescribed pain medicine for severe pain.  You can also take Tylenol 1 g every 8 hours at home.  Do not hesitate to return to the ED or call 911 if you experience: -Worsening symptoms -Foley bag stops draining -Frank blood in the foley bag -Severe abdominal pain -Nausea vomiting so severe you cannot eat or drink anything -Lightheadedness, passing out -Fevers/chills -Anything else that concerns you

## 2022-10-17 NOTE — ED Notes (Signed)
RN aware of pt vitals b/p

## 2022-10-17 NOTE — ED Provider Notes (Signed)
[place] EMERGENCY DEPARTMENT AT [facility] Provider Note   CSN: [id number] Arrival date & time: 10/17/22  1932     History  Chief Complaint  Patient presents with   Abdominal Pain    Melody Myers is a 42 y.o. female with PMH as listed below who is BIBEMS from home.  Per chart review of care everywhere, patient had an anal sphincteroplasty, repair of rectovaginal rectocele today at [facility] with general surgery.  She states that she woke up from surgery in extreme pain and she was discharged.  Since that time she went home and had extreme abdominal pain, 10 out of 10, and has been unable to void.  She called EMS who was en route to [facility] however they reported that they "gave her a lot of fentanyl " and she became somnolent so they diverted here.  Patient states she is unaware if she has had fevers chills because she just was discharged from the hospital.  She has not had a bowel movement since the procedure.  She does endorse pain throughout her abdomen as well down into her rectum and anus.   Past Medical History:  Diagnosis Date   Anal sphincter tear 01/22/2022   Anxiety    Asthma    Bipolar 1 disorder (HCC)    Bipolar depression (HCC) 03/20/2013   Carpal tunnel syndrome 02/04/2013   Formatting of this note might be different from the original. 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 Repaired at [facility] in Sept 2016.  Pt states that all problems are still present.  If followed by [facility] for this. She has f/u appt.  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 10/14/2018   Degenerative disc disease, lumbar 06/05/2015   Formatting of this note might be different from the original.  L4-5 > L3-4, mild   Depression    patient reports bipolor   Depression    Diabetic neuropathy (HCC)    DKA (diabetic ketoacidoses) 03/19/2013   History of adenomatous polyp of colon 04/17/2015   Formatting of this note might be different from the original. Per path 2016   History of anxiety 04/17/2015   History of non-ST elevation myocardial infarction (NSTEMI) 04/30/2021   Hyperlipidemia LDL goal <70 10/04/2018   Hypokalemia 11/15/2014   Ingrown toenail of both feet 04/12/2019   Formatting of this note might be different from the original. great toenails bilateral   Insulin dependent diabetes mellitus with complications    insulin dependant   Jaw pain 02/24/2020   Keratoconjunctivitis sicca of both eyes not specified as Sjogren's 09/24/2016   Leukocytes in urine 06/08/2017   Leukocytosis 03/20/2013   Mild diastolic dysfunction 10/27/2020   Myocardial infarction (HCC) 10/02/2018   Myopia of both eyes 09/24/2016   Neuropathy    Non-ST elevation (NSTEMI) myocardial infarction (HCC) 10/02/2018   NSTEMI (non-ST elevated myocardial infarction) (HCC) 10/02/2018   Spontaneous bruising 08/26/2018   SUI (stress urinary incontinence, female) 12/11/2016   Superficial incisional surgical site infection 11/14/2014   Syncopal episodes    Tobacco abuse 03/20/2013   Urinary incontinence 06/08/2017   Urinary retention 11/14/2014   Vitreous floater, bilateral 09/24/2016       Home Medications Prior to Admission medications  Medication Sig Start Date End Date Taking? Authorizing Provider  acetaminophen (TYLENOL) 325 MG tablet Take 650 mg by mouth every 6 (six) hours as needed for moderate pain or headache.     [provider]  albuterol (VENTOLIN HFA) 108 (90 Base) MCG/ACT inhaler Inhale 2 puffs into the lungs every 6 (six) hours as needed for wheezing or shortness of breath. Patient not taking: Reported on 09/19/2022 12/07/21   Martina Sinner, MD  aspirin EC 81 MG EC tablet  Take 1 tablet (81 mg total) by mouth daily. 10/05/18   Azalee Course, PA  atorvastatin (LIPITOR) 80 MG tablet Take 1 tablet (80 mg total) by mouth daily. 07/23/22   Georgeanna Lea, MD  clopidogrel (PLAVIX) 75 MG tablet Take 1 tablet by mouth once daily 04/08/22   Georgeanna Lea, MD  Continuous Blood Gluc Sensor (DEXCOM G7 SENSOR) MISC 3 each by Does not apply route every 30 (thirty) days. Apply 1 sensor every 10 days E10.59 02/28/22   Carlus Pavlov, MD  Continuous Blood Gluc Transmit (DEXCOM G6 TRANSMITTER) MISC 1 Device by Does not apply route every 3 (three) months. 04/04/22   Carlus Pavlov, MD  fluticasone (FLONASE) 50 MCG/ACT nasal spray Place 2 sprays into both nostrils daily. 2 sprays each nostril at night Patient taking differently: Place 2 sprays into both nostrils at bedtime. 09/03/21   Loyola Mast, MD  Fluticasone-Umeclidin-Vilant (TRELEGY ELLIPTA) 100-62.5-25 MCG/ACT AEPB Take 1 puff by mouth daily. INHALE 1 PUFF INTO LUNGS ONCE DAILY 07/25/22   Martina Sinner, MD  Glucagon 3 MG/DOSE POWD Place 3 mg into the nose once as needed for up to 1 dose (Hypoglycemia). 03/05/22   Corky Crafts, MD  Glycerin-Hypromellose-PEG 400 (DRY EYE RELIEF DROPS OP) Place 1 drop into both eyes daily as needed (dry eye).    [provider]  Insulin Disposable Pump (OMNIPOD 5 G6 INTRO, GEN 5,) KIT 1 each by Does not apply route as needed. Patient taking differently: 1 each by Does not apply route as needed (Glucose). 09/21/21   Carlus Pavlov, MD  Insulin Disposable Pump (OMNIPOD 5 G6 POD, GEN 5,) MISC 1 each by Does not apply route every 3 (three) days. 04/02/22   Carlus Pavlov, MD  Insulin Glargine [facility] KWIKPEN) 100 UNIT/ML Inject 28 Units into the skin daily. 04/15/22   Carlus Pavlov, MD  Insulin Infusion Pump (T:SLIM INSULIN PUMP) DEVI 1 Dose by Does not apply route daily.    [provider]  insulin lispro (HUMALOG) 100 UNIT/ML injection Use up to 100 units a  day in the insulin pump E10.59 Patient taking differently: Inject 100 Units into the skin See admin instructions. Use up to 100 units a day in the insulin pump E10.59 06/26/22   Carlus Pavlov, MD  loperamide (IMODIUM) 2 MG capsule Take 2 mg by mouth as needed for diarrhea or loose stools.    [provider]  metoprolol succinate (TOPROL XL) 25 MG 24 hr tablet Take 1 tablet (25 mg total) by mouth daily. 07/23/22   Georgeanna Lea, MD  montelukast (SINGULAIR) 10 MG tablet Take 1 tablet (10 mg total) by mouth at bedtime. 07/25/22   Martina Sinner, MD  nicotine (NICODERM CQ - DOSED IN MG/24 HR) 7 mg/24hr patch Place 7 mg onto the skin daily.    [provider]  nitroGLYCERIN (NITROSTAT) 0.4 MG SL tablet Place 1 tablet (0.4 mg total) under the tongue every 5 (five) minutes as needed for chest  pain. MAX 3 tab / 15 min 05/10/22   Georgeanna Lea, MD  pantoprazole (PROTONIX) 40 MG tablet Take 1 tablet (40 mg total) by mouth daily. 04/30/21   Loyola Mast, MD  rizatriptan (MAXALT) 5 MG tablet Take 1 tablet (5 mg total) by mouth as needed for migraine. May repeat in 2 hours if needed Patient not taking: Reported on 09/19/2022 04/30/21   Loyola Mast, MD  triamcinolone cream (KENALOG) 0.1 % Apply 1 application topically 2 (two) times daily. Patient taking differently: Apply 1 application  topically daily as needed (itching). 08/08/20   Loyola Mast, MD      Allergies    Amoxicillin, Penicillins, and Prednisone    Review of Systems   Review of Systems A 10 point review of systems was performed and is negative unless otherwise reported in HPI.  Physical Exam Updated Vital Signs BP (!) 96/45   Pulse 96   Temp 98.3 F (36.8 C) (Oral)   Resp 17   SpO2 98%  Physical Exam General: Extremely uncomfortable and tearful female, screaming in pain, lying in bed on her right side HEENT: PERRLA, Sclera anicteric, MMM, trachea midline.  Cardiology: RRR, no murmurs/rubs/gallops.  BL radial and DP pulses equal bilaterally.  Resp: Normal respiratory rate and effort. CTAB, no wheezes, rhonchi, crackles.  Abd: Soft, nondistended.  Tenderness palpation throughout abdominal quadrants, guarding in the right lower quadrant. GU: Dried blood around normal appearing external genitalia. General TTP of the perineum and anal areas. Pt does not allow further examination. Performed with RN chaperone.  MSK: No peripheral edema or signs of trauma.  Skin: warm, dry.. Back: No CVA tenderness Neuro: A&Ox4, CNs II-XII grossly intact. MAEs. Sensation grossly intact.   ED Results / Procedures / Treatments   Labs (all labs ordered are listed, but only abnormal results are displayed) Labs Reviewed  COMPREHENSIVE METABOLIC PANEL - Abnormal; Notable for the following components:      Result Value   Sodium 132 (*)    Potassium 3.2 (*)    Glucose, Bld 156 (*)    Calcium 8.4 (*)    All other components within normal limits  CBC - Abnormal; Notable for the following components:   WBC 20.1 (*)    All other components within normal limits  URINALYSIS, ROUTINE W REFLEX MICROSCOPIC - Abnormal; Notable for the following components:   Hgb urine dipstick MODERATE (*)    All other components within normal limits  LIPASE, BLOOD  HCG, SERUM, QUALITATIVE    EKG EKG Interpretation Date/Time:  Thursday October 17 2022 19:44:24 EDT Ventricular Rate:  88 PR Interval:  148 QRS Duration:  111 QT Interval:  384 QTC Calculation: 465 R Axis:   -73  Text Interpretation: Sinus rhythm Left axis deviation Baseline wander in lead(s) II Confirmed by Vivi Barrack [phone]) on 10/17/2022 8:56:55 PM  Radiology CT ABDOMEN PELVIS W CONTRAST  Result Date: 10/17/2022 CLINICAL DATA:  Acute diffuse abdominal pain. Status post anal sphincterotomy and rectocele repair. Urinary hesitancy. EXAM: CT ABDOMEN AND PELVIS WITH CONTRAST TECHNIQUE: Multidetector CT imaging of the abdomen and pelvis was performed using the  standard protocol following bolus administration of intravenous contrast. RADIATION DOSE REDUCTION: This exam was performed according to the departmental dose-optimization program which includes automated exposure control, adjustment of the mA and/or kV according to patient size and/or use of iterative reconstruction technique. CONTRAST:  OMNIPAQUE IOHEXOL 300 MG/ML  SOLN COMPARISON:  07/07/2020 FINDINGS: Lower chest: No acute abnormality. Hepatobiliary: No  focal liver abnormality is seen. No gallstones, gallbladder wall thickening, or biliary dilatation. Pancreas: Unremarkable Spleen: Unremarkable Adrenals/Urinary Tract: The adrenal glands are unremarkable. The kidneys are normal. Foley catheter balloon seen within a decompressed bladder lumen. Stomach/Bowel: The stomach, small bowel, and large bowel are unremarkable and there is no evidence of obstruction. Appendix normal. No free intraperitoneal gas. There is extensive perirectal inflammatory stranding surrounding the inferior rectum, confluent with the anal canal and extending into the rectovaginal space likely reflecting changes related to reported rectocele repair. No loculated extraluminal fluid collection or extraluminal gas is identified. Vascular/Lymphatic: Aortic atherosclerosis. No enlarged abdominal or pelvic lymph nodes. Reproductive: Perivaginal inflammatory changes are seen, again likely related to the reported pelvic surgery. There is fluid seen within the vaginal canal. The pelvic organs are unremarkable. Other: No abdominal wall hernia Musculoskeletal: No acute bone abnormality. No lytic or blastic bone lesion. IMPRESSION: 1. Extensive perirectal inflammatory stranding surrounding the inferior rectum, confluent with the anal canal and extending into the rectovaginal space likely reflecting changes related to reported rectocele repair. No loculated extraluminal fluid collection or extraluminal gas is identified. 2. Fluid within the vaginal  canal. Aortic Atherosclerosis (ICD10-I70.0). Electronically Signed   By: Helyn Numbers M.D.   On: 10/17/2022 22:04    Procedures Procedures    Medications Ordered in ED Medications  fentaNYL (SUBLIMAZE) injection 50 mcg (50 mcg Intravenous Given 10/17/22 2044)  ondansetron [facility]) injection 4 mg (4 mg Intravenous Given 10/17/22 2044)  HYDROmorphone (DILAUDID) injection 1 mg (1 mg Intravenous Given 10/17/22 2059)  iohexol (OMNIPAQUE) 300 MG/ML solution 100 mL (100 mLs Intravenous Contrast Given 10/17/22 2109)    ED Course/ Medical Decision Making/ A&P                          Medical Decision Making Amount and/or Complexity of Data Reviewed Labs: ordered. Decision-making details documented in ED Course. Radiology: ordered. Decision-making details documented in ED Course.  Risk Prescription drug management.    This patient presents to the ED for concern of abdominal pain, urinary tension after an anal sphincteroplasty and rectocele repair, this involves an extensive number of treatment options, and is a complaint that carries with it a high risk of complications and morbidity.  I considered the following differential and admission for this acute, potentially life threatening condition.   MDM:    For DDX for abdominal pain includes but is not limited to:  Abdominal exam with concern for guarding in the RLQ. She is in extreme pain, concern for post-operative complication such as perforated viscous, intraabdominal bleeding, bladder/ureter violation/rupture. Low suspicion for acute hepatobiliary disease (including acute cholecystitis or cholangitis), acute pancreatitis (neg lipase). Also consider but less likely acute appendicitis, vascular catastrophe, bowel obstruction, viscus perforation, diverticulitis. Patient just had the surgery today, too early for post-op infection.   Ddx for urinary retention includes but is not limited to: -Post-operative urinary retention after urogyn surgery  - patient states she was warned she may experience urinary retention after surgery. Consider pre-existing voiding dysfunction, urinary retention d/t trauma during surgery or anesthesia. Also consider obstructive causes: stone (nephrolithiasis, ureterolithiasis), infection (UTI, Pyelonephritis), blood clot.  Bladder scan demonstrates greater than 40 and 60 cc of urine in bladder and Foley catheter was placed without incident.  Clear transparent urine drains into bag.  Clinical Course as of 10/17/22 2346  Thu Oct 17, 2022  2029 WBC(!): 20.1 +leukocytosis [HN]  2054 Preg, Serum: NEGATIVE neg [HN]  2226 CT  ABDOMEN PELVIS W CONTRAST 1. Extensive perirectal inflammatory stranding surrounding the inferior rectum, confluent with the anal canal and extending into the rectovaginal space likely reflecting changes related to reported rectocele repair. No loculated extraluminal fluid collection or extraluminal gas is identified. 2. Fluid within the vaginal canal.   [HN]  2228 Will call to [facility] general surgeon on call [HN]  2229 Lipase: 20 neg [HN]  2229 Urinalysis, Routine w reflex microscopic -Urine, Clean Catch(!) Mild hematuria w/ foley in place. No UTI demonstrated. [HN]  2229 Preg, Serum: NEGATIVE neg [HN]  2334 Her CT scan does not demonstrate any emergent surgical pathology and instead just demonstrates postsurgical changes.   I discussed with Dr. Lynnea Ferrier urogyn fellow at [facility].  The patient actually had her surgery completed in an outpatient surgical center at [facility] that is staffed by surgeons from [facility].  Dr. Lynnea Ferrier accepted the patient ED to ED for evaluation and transfer. She feels improved after Dilaudid.  I discussed with her and offered transfer to [facility] ED for evaluation by the surgery team and she declined stating she would like to go home and call the surgeons in the morning. SBP in the 90s-100s typical for patient based on  chart review, she is also lying down sleeping which also accounts for BP. She does not feel lightheaded. Nause aalso improved. She states she has pain and nausea medicine at home. She will be DC'd with the foley catheter in place and instructed to f/u with surgery as originally scheduled. DC w/ discharge instructions/return precautions. All questions answered to patient's satisfaction.   [HN]    Clinical Course User Index [HN] Loetta Rough, MD    Labs: I Ordered, and personally interpreted labs.  The pertinent results include:  those listed above  Imaging Studies ordered: I ordered imaging studies including CT abd pelvis w contrast I independently visualized and interpreted imaging. I agree with the radiologist interpretation  Additional history obtained from chart review.  External records from outside source obtained and reviewed including [facility]  Cardiac Monitoring: The patient was maintained on a cardiac monitor.  I personally viewed and interpreted the cardiac monitored which showed an underlying rhythm of: sinus rhythm  Reevaluation: After the interventions noted above, I reevaluated the patient and found that they have :improved  Social Determinants of Health: Lives independently  Disposition:  DC w/ foley in place and instructions to call her surgeon in the AM to make f/u appointment. Also given info for [facility] as well for another follow-up option with foley catheter. Given catheter care instructions/return precautions. Already has pain/nausea meds at home.   Co morbidities that complicate the patient evaluation  Past Medical History:  Diagnosis Date   Anal sphincter tear 01/22/2022   Anxiety    Asthma    Bipolar 1 disorder (HCC)    Bipolar depression (HCC) 03/20/2013   Carpal tunnel syndrome 02/04/2013   Formatting of this note might be different from the original. 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 Repaired at [facility] in Sept 2016.  Pt states that all problems are still present.  If followed by [facility] for this. She has f/u appt.  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   Coronary artery disease1.Severe single-vessel CAD with thrombotic occlusion of  proximal OM1. 10/14/2018   Degenerative disc disease, lumbar 06/05/2015   Formatting of this note might be different from the original. L4-5 > L3-4, mild   Depression    patient reports bipolor   Depression    Diabetic neuropathy (HCC)    DKA (diabetic ketoacidoses) 03/19/2013   History of adenomatous polyp of colon 04/17/2015   Formatting of this note might be different from the original. Per path 2016   History of anxiety 04/17/2015   History of non-ST elevation myocardial infarction (NSTEMI) 04/30/2021   Hyperlipidemia LDL goal <70 10/04/2018   Hypokalemia 11/15/2014   Ingrown toenail of both feet 04/12/2019   Formatting of this note might be different from the original. great toenails bilateral   Insulin dependent diabetes mellitus with complications    insulin dependant   Jaw pain 02/24/2020   Keratoconjunctivitis sicca of both eyes not specified as Sjogren's 09/24/2016   Leukocytes in urine 06/08/2017   Leukocytosis 03/20/2013   Mild diastolic dysfunction 10/27/2020   Myocardial infarction (HCC) 10/02/2018   Myopia of both eyes 09/24/2016   Neuropathy    Non-ST elevation (NSTEMI) myocardial infarction (HCC) 10/02/2018   NSTEMI (non-ST elevated myocardial infarction) (HCC) 10/02/2018   Spontaneous bruising 08/26/2018   SUI (stress urinary incontinence, female) 12/11/2016   Superficial incisional surgical site infection 11/14/2014   Syncopal episodes    Tobacco abuse 03/20/2013   Urinary incontinence 06/08/2017   Urinary retention 11/14/2014   Vitreous floater, bilateral 09/24/2016      Medicines Meds ordered this encounter  Medications   fentaNYL (SUBLIMAZE) injection 50 mcg   ondansetron (ZOFRAN) injection 4 mg   HYDROmorphone (DILAUDID) injection 1 mg   iohexol (OMNIPAQUE) 300 MG/ML solution 100 mL    I have reviewed the patients home medicines and have made adjustments as needed  Problem List / ED Course: Problem List Items Addressed This Visit   None Visit Diagnoses     Acute post-operative pain    -  Primary   Acute urinary retention                       This note was created using dictation software, which may contain spelling or grammatical errors.    Loetta Rough, MD 10/17/22 [phone]

## 2022-10-17 NOTE — ED Notes (Signed)
Patient transported to CT 

## 2022-10-17 NOTE — ED Notes (Addendum)
Pt back from CT

## 2022-10-17 NOTE — ED Triage Notes (Addendum)
Pt presents via EMS from home. Pt had procedure on rectum and vagina today at [facility] per report. Pt presents c/o significant abd pain from lower abd to epigastric region and inability to void. IV Fentanyl given by EMS.

## 2022-10-18 DIAGNOSIS — G8918 Other acute postprocedural pain: Secondary | ICD-10-CM | POA: Insufficient documentation

## 2022-10-18 NOTE — ED Notes (Signed)
Pt went home with foley leg bag to follow-up with urology.

## 2022-10-25 DIAGNOSIS — F332 Major depressive disorder, recurrent severe without psychotic features: Secondary | ICD-10-CM | POA: Insufficient documentation

## 2022-10-25 DIAGNOSIS — F4310 Post-traumatic stress disorder, unspecified: Secondary | ICD-10-CM | POA: Insufficient documentation

## 2022-12-01 ENCOUNTER — Other Ambulatory Visit: Payer: Self-pay | Admitting: Pulmonary Disease

## 2022-12-01 DIAGNOSIS — J4540 Moderate persistent asthma, uncomplicated: Secondary | ICD-10-CM

## 2022-12-12 DIAGNOSIS — J42 Unspecified chronic bronchitis: Secondary | ICD-10-CM | POA: Insufficient documentation

## 2022-12-25 ENCOUNTER — Ambulatory Visit: Payer: 59 | Attending: Cardiology | Admitting: Cardiology

## 2022-12-25 ENCOUNTER — Encounter: Payer: Self-pay | Admitting: Cardiology

## 2022-12-25 VITALS — BP 102/68 | HR 94 | Ht 64.0 in | Wt 140.0 lb

## 2022-12-25 DIAGNOSIS — R0609 Other forms of dyspnea: Secondary | ICD-10-CM | POA: Diagnosis not present

## 2022-12-25 DIAGNOSIS — F411 Generalized anxiety disorder: Secondary | ICD-10-CM | POA: Diagnosis not present

## 2022-12-25 DIAGNOSIS — I25119 Atherosclerotic heart disease of native coronary artery with unspecified angina pectoris: Secondary | ICD-10-CM

## 2022-12-25 DIAGNOSIS — E1059 Type 1 diabetes mellitus with other circulatory complications: Secondary | ICD-10-CM

## 2022-12-25 DIAGNOSIS — E559 Vitamin D deficiency, unspecified: Secondary | ICD-10-CM

## 2022-12-25 DIAGNOSIS — R5383 Other fatigue: Secondary | ICD-10-CM

## 2022-12-25 NOTE — Addendum Note (Signed)
Addended by: Baldo Ash D on: 12/25/2022 03:58 PM   Modules accepted: Orders

## 2022-12-25 NOTE — Patient Instructions (Signed)
Medication Instructions:  Your physician recommends that you continue on your current medications as directed. Please refer to the Current Medication list given to you today.  *If you need a refill on your cardiac medications before your next appointment, please call your pharmacy*   Lab Work: Your physician recommends that you return for lab work in: when fasting You need to have labs done when you are fasting.  You can come Monday through Friday 8:30 am to 12:00 pm and 1:15 to 4:30. You do not need to make an appointment as the order has already been placed. The labs you are going to have done are TSH, B12, D3, CBC, Lipids.    Testing/Procedures: Your physician has requested that you have an echocardiogram. Echocardiography is a painless test that uses sound waves to create images of your heart. It provides your doctor with information about the size and shape of your heart and how well your heart's chambers and valves are working. This procedure takes approximately one hour. There are no restrictions for this procedure. Please do NOT wear cologne, perfume, aftershave, or lotions (deodorant is allowed). Please arrive 15 minutes prior to your appointment time.    Follow-Up: At [facility], you and your health needs are our priority.  As part of our continuing mission to provide you with exceptional heart care, we have created designated Provider Care Teams.  These Care Teams include your primary Cardiologist (physician) and Advanced Practice Providers (APPs -  Physician Assistants and Nurse Practitioners) who all work together to provide you with the care you need, when you need it.  We recommend signing up for the patient portal called "MyChart".  Sign up information is provided on this After Visit Summary.  MyChart is used to connect with patients for Virtual Visits (Telemedicine).  Patients are able to view lab/test results, encounter notes, upcoming appointments, etc.  Non-urgent messages  can be sent to your provider as well.   To learn more about what you can do with MyChart, go to [email or web address].    Your next appointment:   6 month(s)  The format for your next appointment:   In Person  Provider:   Gypsy Balsam, MD    Other Instructions NA

## 2022-12-25 NOTE — Progress Notes (Signed)
Cardiology Office Note:    Date:  12/25/2022   ID:  Melody Myers, DOB 1980/03/30, MRN [id number]  PCP:  Howell Pringle, PA-C  Cardiologist:  Gypsy Balsam, MD    Referring MD: Dorene Grebe*   Chief Complaint  Patient presents with   Follow-up   Fatigue    History of Present Illness:    Melody Myers is a 42 y.o. female   with past medical history significant for coronary artery disease.  In August 2020 she did have PTCA and stenting done of the obtuse marginal branch in face of acute myocardial infarction additional problem include type 1 diabetes, smoking likely she just recently quit, dyslipidemia, psychological issues. Last time I seen her she Complained of having chest pain.  In March we did stress test which showed no evidence of ischemia but because of her multiple risk factors for coronary artery disease we decided to proceed with cardiac catheterization to rule out obstructive disease.  Her left side cardiac catheterization was done on March 05, 2022 which showed proximal circumflex 35% stenosis, ramus 30% stenosis, mid LAD 40% stenosis proximal RCA mid RCA 40% stenosis, obtuse marginal 1 stent was widely patent ejection fraction was normal interestingly study appears to have less stenosis that noted previously in mid LAD and mid RCA. Comes today to months for follow-up overall she is frustrated because she is weak tired and exhausted good news since she stopped smoking.  She does see psychologist and psychiatrist however she was put on a lot of medications she thinks maybe does medication make her weak tired and exhausted she also find out her her brother who was in the hospital after bypass surgery deteriorated and on the way to my office she started smoking.  Denies have any chest pain tightness squeezing pressure burning chest  Past Medical History:  Diagnosis Date   Anal sphincter tear 01/22/2022   Anxiety    Asthma    Bipolar 1 disorder (HCC)     Bipolar depression (HCC) 03/20/2013   Carpal tunnel syndrome 02/04/2013   Formatting of this note might be different from the original. IMPRESSION: Order right CTI [id number]- ASAP   Chest pain 12/24/2018   Chronic pain 03/20/2013   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   Formatting of this note might be different from the original. Repaired at [facility] in Sept 2016.  Pt states that all problems are still present.  If followed by [facility] for this. She has f/u appt.  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 10/14/2018   Degenerative disc disease, lumbar 06/05/2015   Formatting of this note might be different from the original. L4-5 > L3-4, mild   Depression    patient reports bipolor   Depression    Diabetic neuropathy (HCC)    DKA (diabetic ketoacidoses) 03/19/2013   History of adenomatous polyp of colon 04/17/2015   Formatting of this note might be different from the original. Per path 2016   History of anxiety 04/17/2015   History of non-ST elevation myocardial infarction (NSTEMI) 04/30/2021   Hyperlipidemia LDL goal <70 10/04/2018   Hypokalemia 11/15/2014   Ingrown toenail of both feet 04/12/2019   Formatting of this note might be different from the original. great toenails bilateral   Insulin dependent diabetes mellitus with complications    insulin dependant   Jaw pain 02/24/2020   Keratoconjunctivitis sicca of both eyes not specified as Sjogren's 09/24/2016  Leukocytes in urine 06/08/2017   Leukocytosis 03/20/2013   Mild diastolic dysfunction 10/27/2020   Myocardial infarction (HCC) 10/02/2018   Myopia of both eyes 09/24/2016   Neuropathy    Non-ST elevation (NSTEMI) myocardial infarction [facility]) 10/02/2018   NSTEMI (non-ST elevated myocardial infarction) (HCC) 10/02/2018   Spontaneous bruising 08/26/2018   SUI (stress urinary incontinence, female) 12/11/2016   Superficial incisional surgical  site infection 11/14/2014   Syncopal episodes    Tobacco abuse 03/20/2013   Urinary incontinence 06/08/2017   Urinary retention 11/14/2014   Vitreous floater, bilateral 09/24/2016    Past Surgical History:  Procedure Laterality Date   CORONARY STENT INTERVENTION N/A 10/02/2018   Procedure: CORONARY STENT INTERVENTION;  Surgeon: Yvonne Kendall, MD;  Location: [facility] INVASIVE CV LAB;  Service: Cardiovascular;  Laterality: N/A;   LEFT HEART CATH AND CORONARY ANGIOGRAPHY N/A 10/02/2018   Procedure: LEFT HEART CATH AND CORONARY ANGIOGRAPHY;  Surgeon: Yvonne Kendall, MD;  Location: [facility] INVASIVE CV LAB;  Service: Cardiovascular;  Laterality: N/A;   LEFT HEART CATH AND CORONARY ANGIOGRAPHY N/A 03/05/2022   Procedure: LEFT HEART CATH AND CORONARY ANGIOGRAPHY;  Surgeon: Corky Crafts, MD;  Location: [facility] INVASIVE CV LAB;  Service: Cardiovascular;  Laterality: N/A;   TUBAL LIGATION     VAGINA RECONSTRUCTION SURGERY  10/2014   & ana, reconstruction    Current Medications: Current Meds  Medication Sig   acetaminophen (TYLENOL) 325 MG tablet Take 650 mg by mouth every 6 (six) hours as needed for moderate pain or headache.    albuterol (VENTOLIN HFA) 108 (90 Base) MCG/ACT inhaler Inhale 2 puffs into the lungs every 6 (six) hours as needed for wheezing or shortness of breath.   aspirin EC 81 MG EC tablet Take 1 tablet (81 mg total) by mouth daily.   atorvastatin (LIPITOR) 80 MG tablet Take 1 tablet (80 mg total) by mouth daily.   clopidogrel (PLAVIX) 75 MG tablet Take 1 tablet by mouth once daily   Continuous Blood Gluc Sensor (DEXCOM G7 SENSOR) MISC 3 each by Does not apply route every 30 (thirty) days. Apply 1 sensor every 10 days E10.59   Continuous Blood Gluc Transmit (DEXCOM G6 TRANSMITTER) MISC 1 Device by Does not apply route every 3 (three) months.   fluticasone (FLONASE) 50 MCG/ACT nasal spray Place 2 sprays into both nostrils daily. 2 sprays each nostril at night (Patient taking differently:  Place 2 sprays into both nostrils at bedtime.)   Fluticasone-Umeclidin-Vilant (TRELEGY ELLIPTA) 100-62.5-25 MCG/ACT AEPB INHALE 1 PUFF INTO LUNGS ONCE DAILY (Patient taking differently: Inhale 1 puff into the lungs daily.)   Glucagon 3 MG/DOSE POWD Place 3 mg into the nose once as needed for up to 1 dose (Hypoglycemia).   Glycerin-Hypromellose-PEG 400 (DRY EYE RELIEF DROPS OP) Place 1 drop into both eyes daily as needed (dry eye).   Insulin Disposable Pump (OMNIPOD 5 G6 INTRO, GEN 5,) KIT 1 each by Does not apply route as needed. (Patient taking differently: 1 each by Does not apply route as needed (Glucose).)   Insulin Disposable Pump (OMNIPOD 5 G6 POD, GEN 5,) MISC 1 each by Does not apply route every 3 (three) days.   Insulin Glargine (BASAGLAR KWIKPEN) 100 UNIT/ML Inject 28 Units into the skin daily.   Insulin Infusion Pump (T:SLIM INSULIN PUMP) DEVI 1 Dose by Does not apply route daily.   insulin lispro (HUMALOG) 100 UNIT/ML injection Use up to 100 units a day in the insulin pump E10.59 (Patient taking differently: Inject 100 Units  into the skin See admin instructions. Use up to 100 units a day in the insulin pump E10.59)   loperamide (IMODIUM) 2 MG capsule Take 2 mg by mouth as needed for diarrhea or loose stools.   metoprolol succinate (TOPROL XL) 25 MG 24 hr tablet Take 1 tablet (25 mg total) by mouth daily.   montelukast (SINGULAIR) 10 MG tablet Take 1 tablet (10 mg total) by mouth at bedtime.   nicotine (NICODERM CQ - DOSED IN MG/24 HR) 7 mg/24hr patch Place 7 mg onto the skin daily.   nitroGLYCERIN (NITROSTAT) 0.4 MG SL tablet Place 1 tablet (0.4 mg total) under the tongue every 5 (five) minutes as needed for chest pain. MAX 3 tab / 15 min   pantoprazole (PROTONIX) 40 MG tablet Take 1 tablet (40 mg total) by mouth daily.   rizatriptan (MAXALT) 5 MG tablet Take 1 tablet (5 mg total) by mouth as needed for migraine. May repeat in 2 hours if needed   triamcinolone cream (KENALOG) 0.1 % Apply  1 application topically 2 (two) times daily. (Patient taking differently: Apply 1 application  topically daily as needed (itching).)     Allergies:   Cyclobenzaprine, Amoxicillin, Penicillins, and Prednisone   Social History   Socioeconomic History   Marital status: Widowed    Spouse name: Not on file   Number of children: 2   Years of education: Not on file   Highest education level: Not on file  Occupational History   Occupation: Disabled  Tobacco Use   Smoking status: Every Day    Current packs/day: 0.25    Types: Cigarettes   Smokeless tobacco: Never   Tobacco comments:    2-3 cigarettes per day  Vaping Use   Vaping status: Never Used  Substance and Sexual Activity   Alcohol use: Not Currently    Comment: occasional   Drug use: No   Sexual activity: Yes    Comment: tubal ligation  Other Topics Concern   Not on file  Social History Narrative   Not on file   Social Determinants of Health   Financial Resource Strain: High Risk (10/02/2018)   Overall Financial Resource Strain (CARDIA)    Difficulty of Paying Living Expenses: Hard  Food Insecurity: Low Risk  (12/10/2022)   Received from [facility]   Hunger Vital Sign    Worried About Running Out of Food in the Last Year: Never true    Ran Out of Food in the Last Year: Never true  Transportation Needs: No Transportation Needs (12/10/2022)   Received from [facility]    In the past 12 months, has lack of reliable transportation kept you from medical appointments, meetings, work or from getting things needed for daily living? : No  Physical Activity: Insufficiently Active (10/02/2018)   Exercise Vital Sign    Days of Exercise per Week: 1 day    Minutes of Exercise per Session: 30 min  Stress: Stress Concern Present (10/02/2018)   [employer] of Occupational Health - Occupational Stress Questionnaire    Feeling of Stress : Very much  Social Connections: Unknown (06/26/2021)   Received from  [facility]   Social Network    Social Network: Not on file     Family History: The patient's family history includes COPD in her paternal grandmother; Diabetes in her maternal grandmother and sister; Heart attack (age of onset: 23) in her brother; Heart attack (age of onset: 81) in her father; Heart  disease in her brother. ROS:   Please see the history of present illness.    All 14 point review of systems negative except as described per history of present illness  EKGs/Labs/Other Studies Reviewed:         Recent Labs: 10/17/2022: ALT 19; BUN 7; Creatinine, Ser 0.61; Hemoglobin 12.8; Platelets 235; Potassium 3.2; Sodium 132  Recent Lipid Panel    Component Value Date/Time   CHOL 106 08/06/2021 0844   CHOL 100 06/19/2020 0844   TRIG 101.0 08/06/2021 0844   HDL 39.40 08/06/2021 0844   HDL 40 06/19/2020 0844   CHOLHDL 3 08/06/2021 0844   VLDL 20.2 08/06/2021 0844   LDLCALC 46 08/06/2021 0844   LDLCALC 46 06/19/2020 0844    Physical Exam:    VS:  BP 102/68 (BP Location: Left Arm, Patient Position: Sitting)   Pulse 94   Ht 5\' 4"  (1.626 m)   Wt 140 lb (63.5 kg)   SpO2 95%   BMI 24.03 kg/m     Wt Readings from Last 3 Encounters:  12/25/22 140 lb (63.5 kg)  09/19/22 141 lb 3.2 oz (64 kg)  07/05/22 137 lb 12.8 oz (62.5 kg)     GEN:  Well nourished, well developed in no acute distress HEENT: Normal NECK: No JVD; No carotid bruits LYMPHATICS: No lymphadenopathy CARDIAC: RRR, no murmurs, no rubs, no gallops RESPIRATORY:  Clear to auscultation without rales, wheezing or rhonchi  ABDOMEN: Soft, non-tender, non-distended MUSCULOSKELETAL:  No edema; No deformity  SKIN: Warm and dry LOWER EXTREMITIES: no swelling NEUROLOGIC:  Alert and oriented x 3 PSYCHIATRIC:  Normal affect   ASSESSMENT:    1. Coronary artery disease involving native coronary artery of native heart with angina pectoris (HCC)   2. Type 1 diabetes mellitus with other circulatory  complication (HCC)   3. Generalized anxiety disorder    PLAN:    In order of problems listed above:  Coronary disease stable from that point we will continue dual antiplatelet therapy.  I do continue because of multiple risk factors. Type 2 diabetes last hemoglobin A1c from March is 8.3.  She understands the problem trying to work on that. General anxiety disorder being followed by psychiatrist which I think is a good idea. Profound weakness fatigue tiredness, I will schedule her to have echocardiogram done to assess left ventricular ejection fraction, will do TSH B12 D3 CBC   Medication Adjustments/Labs and Tests Ordered: Current medicines are reviewed at length with the patient today.  Concerns regarding medicines are outlined above.  No orders of the defined types were placed in this encounter.  Medication changes: No orders of the defined types were placed in this encounter.   Signed, Georgeanna Lea, MD, [facility] 12/25/2022 3:47 PM    [facility]

## 2023-01-02 ENCOUNTER — Ambulatory Visit: Payer: 59 | Admitting: Internal Medicine

## 2023-01-03 ENCOUNTER — Ambulatory Visit: Payer: 59 | Admitting: Cardiology

## 2023-01-29 ENCOUNTER — Ambulatory Visit: Payer: 59 | Attending: Cardiology

## 2023-01-29 DIAGNOSIS — R0609 Other forms of dyspnea: Secondary | ICD-10-CM | POA: Diagnosis not present

## 2023-01-29 LAB — ECHOCARDIOGRAM COMPLETE
Area-P 1/2: 3.95 cm2
S' Lateral: 2.7 cm

## 2023-02-04 ENCOUNTER — Telehealth: Payer: Self-pay

## 2023-02-04 NOTE — Telephone Encounter (Signed)
Left message on My Chart with normal Echo results per Dr. Krasowski's note. Routed to PCP.  

## 2023-02-04 NOTE — Telephone Encounter (Signed)
Pt viewed Echo results on My Chart per Dr. Vanetta Shawl note. Routed to PCP.

## 2023-02-17 ENCOUNTER — Telehealth: Payer: Self-pay

## 2023-02-17 NOTE — Telephone Encounter (Signed)
Patient called and left a vm stating that she was having DKA symptoms: Nausea and elevated Blood sugar. Her Highest sugar was 684.   I called and spoke with the patient and she states that she removed her pump and notice that the little needle was bent and her body was not getting the correct amount of insulin. While on the phone with her I went and spoke with Dr. Elvera Lennox and she stated that the patient needs to make sure that she drinks plenty of water and bolus continuously until her sugar come downs. If her sugar does not come down and she is not able to hold any water she needs to go to the ED and to also check her Ketones and if they are high she needs to go to the ED immediately   Patient voiced understanding

## 2023-03-13 ENCOUNTER — Telehealth: Payer: Self-pay

## 2023-03-13 DIAGNOSIS — E1059 Type 1 diabetes mellitus with other circulatory complications: Secondary | ICD-10-CM

## 2023-03-13 MED ORDER — DEXCOM G7 SENSOR MISC: 3.0000 | 0 refills | Status: DC

## 2023-03-13 NOTE — Telephone Encounter (Signed)
Pt needs to make an appt. Pt has not been seen since 05/2022 and did not reschedule her appt from November  I went ahead and sent in her Dexcom with no refills. Marland Kitchen

## 2023-03-14 NOTE — Telephone Encounter (Signed)
[id number] to schedule appointment, but the mailbox is full.

## 2023-03-19 NOTE — Telephone Encounter (Signed)
Patient is scheduled for 03/20/2023 with Dr. Elvera Lennox.

## 2023-03-20 ENCOUNTER — Ambulatory Visit: Payer: 59 | Admitting: Internal Medicine

## 2023-03-20 NOTE — Progress Notes (Deleted)
Patient ID: Melody Myers, female   DOB: 10-25-1980, 43 y.o.   MRN: [id number]  HPI: Melody Myers is a 43 y.o.-year-old female, returning for follow-up for DM1, diagnosed in 2001, uncontrolled, with complications (history of DKA, CAD, NPDR, gastroparesis, PN, history of foot ulcer).  She previously saw [facility] and Dr. Everardo All here in the practice, but last visit with me was 9 months ago.   Interim history: No increased urination, blurry vision, nausea, chest pain.   She called [place] less than a month ago with glucose at 684 after having had a bent cannula.  Reviewed HbA1c levels: 12/11/2022: HbA1c 7.8% 07/08/2022: HbA1c 7.8% Lab Results  Component Value Date   HGBA1C 8.3 (A) 05/30/2022   HGBA1C 7.7 (A) 02/28/2022   HGBA1C 7.6 (H) 08/06/2021   Insulin pump: - previously on insulin pump (Omnipod) in 2013 to early 2022.  She stopped when she changed endocrinologists. - then t:slim X2 - started 1st week of 01/2022 - on Omnipod 5, but would like to switch back to T:slim  CGM: -Dexcom G6 >> G7  Insulin: -NovoLog >> now Humalog per insurance preference  Supplier: - [employer]  She was previously on: - Novolog 10-12 units, ICR 1:2 carb equiv., target 110, ISF ~25 - Basaglar 28 units hs  On insulin pump: - Basal rates: 12 am: 1.1 units/h - Insulin to carb ratio: 12 am: 1:20 >> 1:17 >> 1:13 - Target: 12 am: 110 - Correction factor (insulin sensitivity factor):  12 am: 1:100  - Active insulin time: 5h  Total daily dose from basal insulin: 70% >> 66% (34 units) Total daily dose from bolus insulin: 30% >> 34% (18 units) In the last 2 weeks, she was in the auto mode (control IQ) 100% of the time.  Meter: Accu-Chek  Pt checks her sugars more than 4 times a day with her CGM:  Previously:   Previously:   Lowest sugar was 37 (did not have the pump on) >> 55; she has hypoglycemia awareness at 60.  She has a glucagon kit at home. No previous hypoglycemia admission.   Highest sugar was in the 500s >> 447 (did not have the pump on) >> 400 >> 684 (bent cannula).  She has a history of DKA.  Pt's meals are usually small.  - no CKD, last BUN/creatinine:  12/11/2022 [facility]): 5/0.57, GFR >90, Glu 126 Lab Results  Component Value Date   BUN 7 10/17/2022   BUN 7 03/01/2022   CREATININE 0.61 10/17/2022   CREATININE 0.61 03/01/2022   Lab Results  Component Value Date   MICRALBCREAT 2.3 08/06/2021   MICRALBCREAT 4.0 07/12/2020   -+ History of HL; last set of lipids: Lab Results  Component Value Date   CHOL 106 08/06/2021   HDL 39.40 08/06/2021   LDLCALC 46 08/06/2021   TRIG 101.0 08/06/2021   CHOLHDL 3 08/06/2021  She is is on Lipitor 80 mg daily.  - last eye exam was in 02/2022: No DR reportedly.  - + numbness and tingling in her feet.  Last foot exam 02/28/2022  TSH: No results found for: "TSH"  She has a history of bipolar disease.  ROS: + see HPI  Past Medical History:  Diagnosis Date   Anal sphincter tear 01/22/2022   Anxiety    Asthma    Bipolar 1 disorder (HCC)    Bipolar depression (HCC) 03/20/2013   Carpal tunnel syndrome 02/04/2013   Formatting of this note might be different from the original. IMPRESSION: Order  right CTI 78469- ASAP   Chest pain 12/24/2018   Chronic pain 03/20/2013   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   Formatting of this note might be different from the original. Repaired at [facility] in Sept 2016.  Pt states that all problems are still present.  If followed by [facility] for this. She has f/u appt.  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 10/14/2018   Degenerative disc disease, lumbar 06/05/2015   Formatting of this note might be different from the original. L4-5 > L3-4, mild   Depression    patient reports bipolor   Depression    Diabetic neuropathy (HCC)    DKA (diabetic ketoacidoses) 03/19/2013   History of  adenomatous polyp of colon 04/17/2015   Formatting of this note might be different from the original. Per path 2016   History of anxiety 04/17/2015   History of non-ST elevation myocardial infarction (NSTEMI) 04/30/2021   Hyperlipidemia LDL goal <70 10/04/2018   Hypokalemia 11/15/2014   Ingrown toenail of both feet 04/12/2019   Formatting of this note might be different from the original. great toenails bilateral   Insulin dependent diabetes mellitus with complications    insulin dependant   Jaw pain 02/24/2020   Keratoconjunctivitis sicca of both eyes not specified as Sjogren's 09/24/2016   Leukocytes in urine 06/08/2017   Leukocytosis 03/20/2013   Mild diastolic dysfunction 10/27/2020   Myocardial infarction (HCC) 10/02/2018   Myopia of both eyes 09/24/2016   Neuropathy    Non-ST elevation (NSTEMI) myocardial infarction (HCC) 10/02/2018   NSTEMI (non-ST elevated myocardial infarction) (HCC) 10/02/2018   Spontaneous bruising 08/26/2018   SUI (stress urinary incontinence, female) 12/11/2016   Superficial incisional surgical site infection 11/14/2014   Syncopal episodes    Tobacco abuse 03/20/2013   Urinary incontinence 06/08/2017   Urinary retention 11/14/2014   Vitreous floater, bilateral 09/24/2016   Past Surgical History:  Procedure Laterality Date   CORONARY STENT INTERVENTION N/A 10/02/2018   Procedure: CORONARY STENT INTERVENTION;  Surgeon: Yvonne Kendall, MD;  Location: [facility] INVASIVE CV LAB;  Service: Cardiovascular;  Laterality: N/A;   LEFT HEART CATH AND CORONARY ANGIOGRAPHY N/A 10/02/2018   Procedure: LEFT HEART CATH AND CORONARY ANGIOGRAPHY;  Surgeon: Yvonne Kendall, MD;  Location: [facility] INVASIVE CV LAB;  Service: Cardiovascular;  Laterality: N/A;   LEFT HEART CATH AND CORONARY ANGIOGRAPHY N/A 03/05/2022   Procedure: LEFT HEART CATH AND CORONARY ANGIOGRAPHY;  Surgeon: Corky Crafts, MD;  Location: [facility] INVASIVE CV LAB;  Service: Cardiovascular;  Laterality: N/A;   TUBAL  LIGATION     VAGINA RECONSTRUCTION SURGERY  10/2014   & ana, reconstruction   Social History   Socioeconomic History   Marital status: Widowed    Spouse name: Not on file   Number of children: 2   Years of education: Not on file   Highest education level: Not on file  Occupational History   Occupation: Disabled  Tobacco Use   Smoking status: Every Day    Current packs/day: 0.25    Types: Cigarettes   Smokeless tobacco: Never   Tobacco comments:    2-3 cigarettes per day  Vaping Use   Vaping status: Never Used  Substance and Sexual Activity   Alcohol use: Not Currently    Comment: occasional   Drug use: No   Sexual activity: Yes    Comment: tubal ligation  Other Topics Concern   Not on file  Social  History Narrative   Not on file   Social Drivers of Health   Financial Resource Strain: High Risk (10/02/2018)   Overall Financial Resource Strain (CARDIA)    Difficulty of Paying Living Expenses: Hard  Food Insecurity: Low Risk  (12/10/2022)   Received from [facility]   Hunger Vital Sign    Worried About Running Out of Food in the Last Year: Never true    Ran Out of Food in the Last Year: Never true  Transportation Needs: No Transportation Needs (12/10/2022)   Received from [facility]    In the past 12 months, has lack of reliable transportation kept you from medical appointments, meetings, work or from getting things needed for daily living? : No  Physical Activity: Insufficiently Active (10/02/2018)   Exercise Vital Sign    Days of Exercise per Week: 1 day    Minutes of Exercise per Session: 30 min  Stress: Stress Concern Present (10/02/2018)   [employer] of Occupational Health - Occupational Stress Questionnaire    Feeling of Stress : Very much  Social Connections: Unknown (06/26/2021)   Received from [facility]   Social Network    Social Network: Not on file  Intimate Partner Violence: Unknown (05/30/2021)   Received from  [facility], [facility]   HITS    Physically Hurt: Not on file    Insult or Talk Down To: Not on file    Threaten Physical Harm: Not on file    Scream or Curse: Not on file   Current Outpatient Medications on File Prior to Visit  Medication Sig Dispense Refill   acetaminophen (TYLENOL) 325 MG tablet Take 650 mg by mouth every 6 (six) hours as needed for moderate pain or headache.      albuterol (VENTOLIN HFA) 108 (90 Base) MCG/ACT inhaler Inhale 2 puffs into the lungs every 6 (six) hours as needed for wheezing or shortness of breath. 8 g 1   aspirin EC 81 MG EC tablet Take 1 tablet (81 mg total) by mouth daily.     atorvastatin (LIPITOR) 80 MG tablet Take 1 tablet (80 mg total) by mouth daily. 90 tablet 3   clopidogrel (PLAVIX) 75 MG tablet Take 1 tablet by mouth once daily 90 tablet 3   Continuous Blood Gluc Transmit (DEXCOM G6 TRANSMITTER) MISC 1 Device by Does not apply route every 3 (three) months. 1 each 3   Continuous Glucose Sensor (DEXCOM G7 SENSOR) MISC 3 each by Does not apply route every 30 (thirty) days. Apply 1 sensor every 10 days E10.59 9 each 0   fluticasone (FLONASE) 50 MCG/ACT nasal spray Place 2 sprays into both nostrils daily. 2 sprays each nostril at night (Patient taking differently: Place 2 sprays into both nostrils at bedtime.) 16 g 6   Fluticasone-Umeclidin-Vilant (TRELEGY ELLIPTA) 100-62.5-25 MCG/ACT AEPB INHALE 1 PUFF INTO LUNGS ONCE DAILY (Patient taking differently: Inhale 1 puff into the lungs daily.) 60 each 11   Glucagon 3 MG/DOSE POWD Place 3 mg into the nose once as needed for up to 1 dose (Hypoglycemia).     Glycerin-Hypromellose-PEG 400 (DRY EYE RELIEF DROPS OP) Place 1 drop into both eyes daily as needed (dry eye).     Insulin Disposable Pump (OMNIPOD 5 G6 INTRO, GEN 5,) KIT 1 each by Does not apply route as needed. (Patient taking differently: 1 each by Does not apply route as needed (Glucose).) 1 kit 0   Insulin Disposable Pump (OMNIPOD 5 G6  POD, GEN  5,) MISC 1 each by Does not apply route every 3 (three) days. 30 each 3   Insulin Glargine (BASAGLAR KWIKPEN) 100 UNIT/ML Inject 28 Units into the skin daily. 15 mL 1   Insulin Infusion Pump (T:SLIM INSULIN PUMP) DEVI 1 Dose by Does not apply route daily.     insulin lispro (HUMALOG) 100 UNIT/ML injection Use up to 100 units a day in the insulin pump E10.59 (Patient taking differently: Inject 100 Units into the skin See admin instructions. Use up to 100 units a day in the insulin pump E10.59) 90 mL 3   loperamide (IMODIUM) 2 MG capsule Take 2 mg by mouth as needed for diarrhea or loose stools.     metoprolol succinate (TOPROL XL) 25 MG 24 hr tablet Take 1 tablet (25 mg total) by mouth daily. 90 tablet 3   montelukast (SINGULAIR) 10 MG tablet Take 1 tablet (10 mg total) by mouth at bedtime. 90 tablet 1   nicotine (NICODERM CQ - DOSED IN MG/24 HR) 7 mg/24hr patch Place 7 mg onto the skin daily.     nitroGLYCERIN (NITROSTAT) 0.4 MG SL tablet Place 1 tablet (0.4 mg total) under the tongue every 5 (five) minutes as needed for chest pain. MAX 3 tab / 15 min 25 tablet 10   pantoprazole (PROTONIX) 40 MG tablet Take 1 tablet (40 mg total) by mouth daily. 90 tablet 3   rizatriptan (MAXALT) 5 MG tablet Take 1 tablet (5 mg total) by mouth as needed for migraine. May repeat in 2 hours if needed 10 tablet 2   triamcinolone cream (KENALOG) 0.1 % Apply 1 application topically 2 (two) times daily. (Patient taking differently: Apply 1 application  topically daily as needed (itching).) 30 g 0   No current facility-administered medications on file prior to visit.   Allergies  Allergen Reactions   Cyclobenzaprine Itching   Amoxicillin Itching   Penicillins Hives, Itching and Swelling    Did it involve swelling of the face/tongue/throat, SOB, or low BP? No Did it involve sudden or severe rash/hives, skin peeling, or any reaction on the inside of your mouth or nose? No Did you need to seek medical attention at a  hospital or doctor's office? No When did it last happen?      Childhood If all above answers are "NO", may proceed with cephalosporin use.   Prednisone Itching and Swelling   Family History  Problem Relation Age of Onset   Heart attack Father 53   Diabetes Sister    Heart attack Brother 65   Heart disease Brother    Diabetes Maternal Grandmother    COPD Paternal Grandmother    PE: There were no vitals taken for this visit. Wt Readings from Last 3 Encounters:  12/25/22 140 lb (63.5 kg)  09/19/22 141 lb 3.2 oz (64 kg)  07/05/22 137 lb 12.8 oz (62.5 kg)   Constitutional: normal weight, in NAD Eyes: no exophthalmos ENT: no masses palpated in neck, no cervical lymphadenopathy Cardiovascular: RRR, No MRG Respiratory: CTA B Musculoskeletal: no deformities Skin:  no rashes Neurological: no tremor with outstretched hands  ASSESSMENT: 1. DM1, uncontrolled, with complications - history of DKA - CAD - NPDR - gastroparesis - PN - history of foot ulcer  2. HL  PLAN:  1. Patient with longstanding, uncontrolled, type 2 diabetes, previously on OmniPod 5+ Dexcom G6 CGM, of which she came off when changing endocrinologists.  She was on a basal/bolus insulin regimen and without the  CGM for period of time, but she was able to start back on insulin pump, t:slim X 2 integrated with the Dexcom G6 CGM.  At last visit she was back on the OmniPod insulin pump but wanted to go back to the t:slim pump.  At that time sugars in the previous 2 weeks are significantly improved compared to the previous 2 weeks.  She was under a lot of stress after her brother-in-law passed away and she felt that the high blood sugars were related to this.  We strengthened her insulin to carb ratios as her sugars were increasing in a stepwise fashion after each meal despite her doing a great job introducing carbs into the pump and bolusing before every meal. -She returns after a longer absence of 9 months.  Since I last saw  her she had 2 HbA1c checked, in 07/15/2022 and 12/15/2022, both at 7.8%, improved CGM interpretation: -At today's visit, we reviewed her CGM downloads: It appears that *** of values are in target range (goal >70%), while *** are higher than 180 (goal <25%), and *** are lower than 70 (goal <4%).  The calculated average blood sugar is ***.  The projected HbA1c for the next 3 months (GMI) is ***. -Reviewing the CGM trends, ***  - I suggested to: Patient Instructions  Please use the following pump settings: - Basal rates: 12 am: 1.1 units/h - Insulin to carb ratio: 12 am: 1:13 - Target: 12 am: 110 - Correction factor (insulin sensitivity factor):  12 am: 1:100  - Active insulin time: 5h  Start the bolus 15 min before every meal, even if the sugars are normal before the meal.  You should have an endocrinology follow-up appointment in 4 months.  - we checked her HbA1c: 7%  - advised to check sugars at different times of the day - 4x a day, rotating check times - advised for yearly eye exams >> she is UTD - return to clinic in 4 months  2. HL -Latest lipid panel was reviewed from 07/2021: All fractions at goal: Lab Results  Component Value Date   CHOL 106 08/06/2021   HDL 39.40 08/06/2021   LDLCALC 46 08/06/2021   TRIG 101.0 08/06/2021   CHOLHDL 3 08/06/2021  -She continues on Lipitor 80 mg daily-no side effects -She is due for another lipid panel  Carlus Pavlov, MD PhD [facility]

## 2023-03-26 ENCOUNTER — Other Ambulatory Visit: Payer: Self-pay | Admitting: Cardiology

## 2023-03-26 DIAGNOSIS — I2510 Atherosclerotic heart disease of native coronary artery without angina pectoris: Secondary | ICD-10-CM

## 2023-04-04 ENCOUNTER — Ambulatory Visit: Payer: 59 | Admitting: Internal Medicine

## 2023-05-22 ENCOUNTER — Other Ambulatory Visit: Payer: Self-pay | Admitting: Internal Medicine

## 2023-05-22 DIAGNOSIS — E1059 Type 1 diabetes mellitus with other circulatory complications: Secondary | ICD-10-CM

## 2023-05-22 MED ORDER — DEXCOM G7 SENSOR MISC
3.0000 | 0 refills | Status: DC
Start: 2023-05-22 — End: 2023-06-19

## 2023-06-02 ENCOUNTER — Ambulatory Visit (INDEPENDENT_AMBULATORY_CARE_PROVIDER_SITE_OTHER): Payer: 59 | Admitting: Internal Medicine

## 2023-06-02 ENCOUNTER — Encounter: Payer: Self-pay | Admitting: Internal Medicine

## 2023-06-02 VITALS — BP 120/80 | HR 93 | Ht 64.0 in | Wt 142.4 lb

## 2023-06-02 DIAGNOSIS — E1059 Type 1 diabetes mellitus with other circulatory complications: Secondary | ICD-10-CM

## 2023-06-02 DIAGNOSIS — E785 Hyperlipidemia, unspecified: Secondary | ICD-10-CM | POA: Diagnosis not present

## 2023-06-02 LAB — POCT GLYCOSYLATED HEMOGLOBIN (HGB A1C): Hemoglobin A1C: 8.5 % — AB (ref 4.0–5.6)

## 2023-06-02 NOTE — Patient Instructions (Addendum)
 Please use the following pump settings: - Basal rates: 12 am: 1.1 units/h - Insulin to carb ratio: 12 am: 1:13 >> 1:15 - Target: 12 am: 110 >> 120 - Correction factor (insulin sensitivity factor):  12 am: 1:100  - Active insulin time: 5h  Start the bolus 15 min before every meal, even if the sugars are normal before the meal.  Try to stop milk. If sugars remain low, then stop coffee later in the day.  You should have an endocrinology follow-up appointment in 4 months.

## 2023-06-02 NOTE — Progress Notes (Signed)
 Patient ID: Melody Myers, female   DOB: 1981/01/14, 43 y.o.   MRN: [id number]  HPI: Melody Myers is a 42 y.o.-year-old female, returning for follow-up for DM1, diagnosed in 2001, uncontrolled, with complications (history of DKA, CAD, NPDR, gastroparesis, PN, history of foot ulcer).  She previously saw [facility] and Dr. Everardo All here in the practice, but last visit with me 1 year ago  Interim history: She has increased urination, blurry vision, nausea. Since last visit, on 02/17/2023 she had an episode of very high blood sugar: Glucose 684 after she had bent cannula.  She had DKA symptoms at that time.  She gave [place] a call and we recommended increased hydration, repeated insulin doses, and checking blood sugars frequently.  She was able to correct the blood sugar without going to the hospital. She has depression >> on Cymbalta.   Reviewed HbA1c levels: 12/11/2022: HbA1c 7.8% 07/08/2022: HbA1c 7.8% Lab Results  Component Value Date   HGBA1C 8.3 (A) 05/30/2022   HGBA1C 7.7 (A) 02/28/2022   HGBA1C 7.6 (H) 08/06/2021   Insulin pump: - previously on insulin pump (Omnipod) in 2013 to early 2022.  She stopped when she changed endocrinologists. - then t:slim X2 - started 1st week of 01/2022 - on Omnipod 5 >>  now T:slim  CGM: -Dexcom G6 >> G7  Insulin: -NovoLog >> now Humalog per insurance preference  Supplier: - [employer]  Backup regimen: - Novolog 10-12 units, ICR 1:2 carb equiv., target 110, ISF ~25 - Basaglar 28 units hs  On insulin pump: - Basal rates: 12 am: 1.1 units/h - Insulin to carb ratio: 12 am: 1:20 >> 1:17 >> 1:13 - Target: 12 am: 110 - Correction factor (insulin sensitivity factor):  12 am: 1:100  - Active insulin time: 5h  Total daily dose from basal insulin: 70% >> 66% (34 units) Total daily dose from bolus insulin: 30% >> 34% (18 units) In the last 2 weeks, she was in the auto mode (control IQ) 100% of the time.  Meter: Accu-Chek  Pt checks  her sugars more than 4 times a day with her CGM:  Previously   Previously:   Lowest sugar was 39 >> 37 (off pump) >> 55 >> 40s; she has hypoglycemia awareness at 60.  She has a glucagon kit at home. No previous hypoglycemia admission.  Highest sugar was in the 500s >> 447 (off pump) >> 400 >> 400s.  She has a history of DKA.  Pt's meals are usually small.  - no CKD, last BUN/creatinine:  12/11/2022: 5/0.57, GFR >90, Glu 126 Lab Results  Component Value Date   BUN 7 10/17/2022   BUN 7 03/01/2022   CREATININE 0.61 10/17/2022   CREATININE 0.61 03/01/2022   Lab Results  Component Value Date   MICRALBCREAT 2.3 08/06/2021   MICRALBCREAT 4.0 07/12/2020   Lab Results  Component Value Date   CHOL 106 08/06/2021   HDL 39.40 08/06/2021   LDLCALC 46 08/06/2021   TRIG 101.0 08/06/2021   CHOLHDL 3 08/06/2021  She is is on Lipitor 80 mg daily.  - last eye exam was in 02/2022: No DR reportedly.  - + numbness and tingling in her feet.  Last foot exam 02/28/2022  TSH: No results found for: "TSH"  She has a history of bipolar disease.  ROS: + see HPI  Past Medical History:  Diagnosis Date   Anal sphincter tear 01/22/2022   Anxiety    Asthma    Bipolar 1 disorder (HCC)  Bipolar depression (HCC) 03/20/2013   Carpal tunnel syndrome 02/04/2013   Formatting of this note might be different from the original. IMPRESSION: Order right CTI 16109- ASAP   Chest pain 12/24/2018   Chronic pain 03/20/2013   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   Formatting of this note might be different from the original. Repaired at [facility] in Sept 2016.  Pt states that all problems are still present.  If followed by [facility] for this. She has f/u appt.  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 10/14/2018   Degenerative disc disease, lumbar 06/05/2015   Formatting of this note might be different from the original.  L4-5 > L3-4, mild   Depression    patient reports bipolor   Depression    Diabetic neuropathy (HCC)    DKA (diabetic ketoacidoses) 03/19/2013   History of adenomatous polyp of colon 04/17/2015   Formatting of this note might be different from the original. Per path 2016   History of anxiety 04/17/2015   History of non-ST elevation myocardial infarction (NSTEMI) 04/30/2021   Hyperlipidemia LDL goal <70 10/04/2018   Hypokalemia 11/15/2014   Ingrown toenail of both feet 04/12/2019   Formatting of this note might be different from the original. great toenails bilateral   Insulin dependent diabetes mellitus with complications    insulin dependant   Jaw pain 02/24/2020   Keratoconjunctivitis sicca of both eyes not specified as Sjogren's 09/24/2016   Leukocytes in urine 06/08/2017   Leukocytosis 03/20/2013   Mild diastolic dysfunction 10/27/2020   Myocardial infarction (HCC) 10/02/2018   Myopia of both eyes 09/24/2016   Neuropathy    Non-ST elevation (NSTEMI) myocardial infarction (HCC) 10/02/2018   NSTEMI (non-ST elevated myocardial infarction) (HCC) 10/02/2018   Spontaneous bruising 08/26/2018   SUI (stress urinary incontinence, female) 12/11/2016   Superficial incisional surgical site infection 11/14/2014   Syncopal episodes    Tobacco abuse 03/20/2013   Urinary incontinence 06/08/2017   Urinary retention 11/14/2014   Vitreous floater, bilateral 09/24/2016   Past Surgical History:  Procedure Laterality Date   CORONARY STENT INTERVENTION N/A 10/02/2018   Procedure: CORONARY STENT INTERVENTION;  Surgeon: Yvonne Kendall, MD;  Location: [facility] INVASIVE CV LAB;  Service: Cardiovascular;  Laterality: N/A;   LEFT HEART CATH AND CORONARY ANGIOGRAPHY N/A 10/02/2018   Procedure: LEFT HEART CATH AND CORONARY ANGIOGRAPHY;  Surgeon: Yvonne Kendall, MD;  Location: [facility] INVASIVE CV LAB;  Service: Cardiovascular;  Laterality: N/A;   LEFT HEART CATH AND CORONARY ANGIOGRAPHY N/A 03/05/2022   Procedure:  LEFT HEART CATH AND CORONARY ANGIOGRAPHY;  Surgeon: Corky Crafts, MD;  Location: [facility] INVASIVE CV LAB;  Service: Cardiovascular;  Laterality: N/A;   TUBAL LIGATION     VAGINA RECONSTRUCTION SURGERY  10/2014   & ana, reconstruction   Social History   Socioeconomic History   Marital status: Widowed    Spouse name: Not on file   Number of children: 2   Years of education: Not on file   Highest education level: Not on file  Occupational History   Occupation: Disabled  Tobacco Use   Smoking status: Every Day    Current packs/day: 0.25    Types: Cigarettes   Smokeless tobacco: Never   Tobacco comments:    2-3 cigarettes per day  Vaping Use   Vaping status: Never Used  Substance and Sexual Activity   Alcohol use: Not Currently    Comment: occasional   Drug  use: No   Sexual activity: Yes    Comment: tubal ligation  Other Topics Concern   Not on file  Social History Narrative   Not on file   Social Drivers of Health   Financial Resource Strain: High Risk (10/02/2018)   Overall Financial Resource Strain (CARDIA)    Difficulty of Paying Living Expenses: Hard  Food Insecurity: Low Risk  (12/10/2022)   Received from [facility]   Hunger Vital Sign    Worried About Running Out of Food in the Last Year: Never true    Ran Out of Food in the Last Year: Never true  Transportation Needs: No Transportation Needs (12/10/2022)   Received from [facility]    In the past 12 months, has lack of reliable transportation kept you from medical appointments, meetings, work or from getting things needed for daily living? : No  Physical Activity: Insufficiently Active (10/02/2018)   Exercise Vital Sign    Days of Exercise per Week: 1 day    Minutes of Exercise per Session: 30 min  Stress: Stress Concern Present (10/02/2018)   [employer] of Occupational Health - Occupational Stress Questionnaire    Feeling of Stress : Very much  Social Connections: Unknown  (06/26/2021)   Received from [facility], [facility]   Social Network    Social Network: Not on file  Intimate Partner Violence: Unknown (05/30/2021)   Received from [facility], [facility]   HITS    Physically Hurt: Not on file    Insult or Talk Down To: Not on file    Threaten Physical Harm: Not on file    Scream or Curse: Not on file   Current Outpatient Medications on File Prior to Visit  Medication Sig Dispense Refill   acetaminophen (TYLENOL) 325 MG tablet Take 650 mg by mouth every 6 (six) hours as needed for moderate pain or headache.      albuterol (VENTOLIN HFA) 108 (90 Base) MCG/ACT inhaler Inhale 2 puffs into the lungs every 6 (six) hours as needed for wheezing or shortness of breath. 8 g 1   aspirin EC 81 MG EC tablet Take 1 tablet (81 mg total) by mouth daily.     atorvastatin (LIPITOR) 80 MG tablet Take 1 tablet (80 mg total) by mouth daily. 90 tablet 3   clopidogrel (PLAVIX) 75 MG tablet Take 1 tablet (75 mg total) by mouth daily. 90 tablet 2   Continuous Blood Gluc Transmit (DEXCOM G6 TRANSMITTER) MISC 1 Device by Does not apply route every 3 (three) months. 1 each 3   Continuous Glucose Sensor (DEXCOM G7 SENSOR) MISC 3 each by Does not apply route every 30 (thirty) days. Apply 1 sensor every 10 days E10.59 3 each 0   fluticasone (FLONASE) 50 MCG/ACT nasal spray Place 2 sprays into both nostrils daily. 2 sprays each nostril at night (Patient taking differently: Place 2 sprays into both nostrils at bedtime.) 16 g 6   Fluticasone-Umeclidin-Vilant (TRELEGY ELLIPTA) 100-62.5-25 MCG/ACT AEPB INHALE 1 PUFF INTO LUNGS ONCE DAILY (Patient taking differently: Inhale 1 puff into the lungs daily.) 60 each 11   Glucagon 3 MG/DOSE POWD Place 3 mg into the nose once as needed for up to 1 dose (Hypoglycemia).     Glycerin-Hypromellose-PEG 400 (DRY EYE RELIEF DROPS OP) Place 1 drop into both eyes daily as needed (dry eye).     Insulin Disposable Pump (OMNIPOD 5 G6 INTRO, GEN 5,) KIT 1  each by Does not apply  route as needed. (Patient taking differently: 1 each by Does not apply route as needed (Glucose).) 1 kit 0   Insulin Disposable Pump (OMNIPOD 5 G6 POD, GEN 5,) MISC 1 each by Does not apply route every 3 (three) days. 30 each 3   Insulin Glargine (BASAGLAR KWIKPEN) 100 UNIT/ML Inject 28 Units into the skin daily. 15 mL 1   Insulin Infusion Pump (T:SLIM INSULIN PUMP) DEVI 1 Dose by Does not apply route daily.     insulin lispro (HUMALOG) 100 UNIT/ML injection Use up to 100 units a day in the insulin pump E10.59 (Patient taking differently: Inject 100 Units into the skin See admin instructions. Use up to 100 units a day in the insulin pump E10.59) 90 mL 3   loperamide (IMODIUM) 2 MG capsule Take 2 mg by mouth as needed for diarrhea or loose stools.     metoprolol succinate (TOPROL XL) 25 MG 24 hr tablet Take 1 tablet (25 mg total) by mouth daily. 90 tablet 3   montelukast (SINGULAIR) 10 MG tablet Take 1 tablet (10 mg total) by mouth at bedtime. 90 tablet 1   nicotine (NICODERM CQ - DOSED IN MG/24 HR) 7 mg/24hr patch Place 7 mg onto the skin daily.     nitroGLYCERIN (NITROSTAT) 0.4 MG SL tablet Place 1 tablet (0.4 mg total) under the tongue every 5 (five) minutes as needed for chest pain. MAX 3 tab / 15 min 25 tablet 10   pantoprazole (PROTONIX) 40 MG tablet Take 1 tablet (40 mg total) by mouth daily. 90 tablet 3   rizatriptan (MAXALT) 5 MG tablet Take 1 tablet (5 mg total) by mouth as needed for migraine. May repeat in 2 hours if needed 10 tablet 2   triamcinolone cream (KENALOG) 0.1 % Apply 1 application topically 2 (two) times daily. (Patient taking differently: Apply 1 application  topically daily as needed (itching).) 30 g 0   No current facility-administered medications on file prior to visit.   Allergies  Allergen Reactions   Cyclobenzaprine Itching   Amoxicillin Itching   Penicillins Hives, Itching and Swelling    Did it involve swelling of the face/tongue/throat,  SOB, or low BP? No Did it involve sudden or severe rash/hives, skin peeling, or any reaction on the inside of your mouth or nose? No Did you need to seek medical attention at a hospital or doctor's office? No When did it last happen?      Childhood If all above answers are "NO", may proceed with cephalosporin use.   Prednisone Itching and Swelling   Family History  Problem Relation Age of Onset   Heart attack Father 30   Diabetes Sister    Heart attack Brother 73   Heart disease Brother    Diabetes Maternal Grandmother    COPD Paternal Grandmother    PE: BP 120/80   Pulse 93   Ht 5\' 4"  (1.626 m)   Wt 142 lb 6.4 oz (64.6 kg)   SpO2 97%   BMI 24.44 kg/m  Wt Readings from Last 3 Encounters:  06/02/23 142 lb 6.4 oz (64.6 kg)  12/25/22 140 lb (63.5 kg)  09/19/22 141 lb 3.2 oz (64 kg)   Constitutional: normal weight, in NAD Eyes: no exophthalmos ENT: no masses palpated in neck, no cervical lymphadenopathy Cardiovascular: Tachycardia, RR, No MRG Respiratory: CTA B Musculoskeletal: no deformities Skin:  no rashes Neurological: no tremor with outstretched hands Diabetic Foot Exam - Simple   Simple Foot Form Diabetic Foot exam  was performed with the following findings: Yes 06/02/2023  3:03 PM  Visual Inspection No deformities, no ulcerations, no other skin breakdown bilaterally: Yes Sensation Testing See comments: Yes Pulse Check Posterior Tibialis and Dorsalis pulse intact bilaterally: Yes Comments Absent sensation in B feet    ASSESSMENT: 1. DM1, uncontrolled, with complications - history of DKA - CAD - NPDR - gastroparesis - PN - history of foot ulcer  06/17/2022:  Fasting glucose 106 C-peptide <0.10  2. HL  PLAN:  1. Patient with longstanding, uncontrolled, type 1 diabetes, on OmniPod 5 insulin pump integrated with the Dexcom G7 CGM.  She was also on the t:slim X2 insulin pump in the past but she had to go back to OmniPod 5 - was on this at last OV.  She is  now back on the t:slim, which she likes more.  We have her on Humalog per insurance preference. -At last visit, sugars were significantly better in the 2 weeks prior to the visit, after improving stress level with her brother-in-law passing away and having to help her sister through this difficult period.  She was doing a good job introducing carbs into the pump and bolusing before every meal so I suggested to strengthen her insulin to carb ratio.  At that time, HbA1c was higher, at 8.3%.  Since then, she had 2 Hba1c levels of 7.8%, the latest being 6 months ago. -I previously recommended that when eating a low-carb meal, to count 50% of the protein amount as grams.  I also recommended a reference book for more help managing the insulin pump. CGM interpretation: -At today's visit, we reviewed her CGM downloads: It appears that 57% of values are in target range (goal >70%), while 42% are higher than 180 (goal <25%), and 1% are lower than 70 (goal <4%).  The calculated average blood sugar is 175.  The projected HbA1c for the next 3 months (GMI) is 7.5%. -Reviewing the CGM trends, sugars appear to be quite fluctuating.  She does have blood sugars in the target range but they increase at the time of her meals and particularly after lunch.  Upon reviewing the pump downloads, she appears to be introducing carbs in the pump only after sugars increased drastically, and the postprandial blood sugars are usually dropping abruptly.  Upon questioning, she denies that she is entering carbs into the pump and bolusing after meals.  She mentions that she always enters carbs and boluses in advance.  In this case, the blood sugar profile and the history do not align very well.  She does mention that she is drinking coffee throughout the day and she reports milk in it.  She ends up drinking a lot of milk.  She feels that this is why the sugars are increasing significantly.  We discussed about possibly stopping coffee later in the  day but she is not able to do so.  In that case, I advised her to stop milk and she will try to do so.  In the meantime, if indeed she is bolusing before meals and she continues to drop her blood sugars, we need to relax her insulin to carb ratios.  There is a possibility that her sugars are increasing due to correction of low blood sugars without eating.  In that case, I also advised her to relax her glucose targets. - I suggested to: Patient Instructions  Please use the following pump settings: - Basal rates: 12 am: 1.1 units/h - Insulin to carb ratio: 12  am: 1:13 >> 1:15 - Target: 12 am: 110 >> 120 - Correction factor (insulin sensitivity factor):  12 am: 1:100  - Active insulin time: 5h  Start the bolus 15 min before every meal, even if the sugars are normal before the meal.  Try to stop milk. If sugars remain low, then stop coffee later in the day.  You should have an endocrinology follow-up appointment in 4 months.  - we checked her HbA1c: 8.5% (lower) - advised to check sugars at different times of the day - 4x a day, rotating check times - advised for yearly eye exams >> she is not UTD - wil check annual labs todayl - return to clinic in 3-4 months  2. HL - Latest lipid panel was reviewed from 07/2021: All fractions at goal: Lab Results  Component Value Date   CHOL 106 08/06/2021   HDL 39.40 08/06/2021   LDLCALC 46 08/06/2021   TRIG 101.0 08/06/2021   CHOLHDL 3 08/06/2021  -She continues Lipitor 80 mg daily without side effects - She is due for another lipid panel -will check this today  Orders Placed This Encounter  Procedures   TSH   Microalbumin / creatinine urine ratio   Lipid Panel w/reflex Direct LDL   COMPLETE METABOLIC PANEL WITHOUT GFR   POCT glycosylated hemoglobin (Hb A1C)   Carlus Pavlov, MD PhD [facility] Endocrinology

## 2023-06-03 ENCOUNTER — Encounter: Payer: Self-pay | Admitting: Internal Medicine

## 2023-06-03 LAB — LIPID PANEL W/REFLEX DIRECT LDL
Cholesterol: 128 mg/dL (ref <200)
HDL: 54 mg/dL (ref >=50)
LDL Cholesterol (Calc): 56 mg/dL
Non-HDL Cholesterol (Calc): 74 mg/dL (ref <130)
Total CHOL/HDL Ratio: 2.4 (calc) (ref <5.0)
Triglycerides: 96 mg/dL (ref <150)

## 2023-06-03 LAB — COMPLETE METABOLIC PANEL WITHOUT GFR
AG Ratio: 1.8 (calc) (ref 1.0–2.5)
ALT: 14 U/L (ref 6–29)
AST: 16 U/L (ref 10–30)
Albumin: 4.5 g/dL (ref 3.6–5.1)
Alkaline phosphatase (APISO): 82 U/L (ref 31–125)
BUN: 7 mg/dL (ref 7–25)
CO2: 29 mmol/L (ref 20–32)
Calcium: 9.9 mg/dL (ref 8.6–10.2)
Chloride: 103 mmol/L (ref 98–110)
Creat: 0.56 mg/dL (ref 0.50–0.99)
Globulin: 2.5 g/dL (ref 1.9–3.7)
Glucose, Bld: 125 mg/dL — ABNORMAL HIGH (ref 65–99)
Potassium: 3.9 mmol/L (ref 3.5–5.3)
Sodium: 138 mmol/L (ref 135–146)
Total Bilirubin: 0.3 mg/dL (ref 0.2–1.2)
Total Protein: 7 g/dL (ref 6.1–8.1)

## 2023-06-03 LAB — TSH: TSH: 1.39 m[IU]/L

## 2023-06-03 LAB — MICROALBUMIN / CREATININE URINE RATIO
Creatinine, Urine: 20 mg/dL (ref 20–275)
Microalb, Ur: <0.2 mg/dL

## 2023-06-18 ENCOUNTER — Other Ambulatory Visit: Payer: Self-pay | Admitting: Internal Medicine

## 2023-06-18 DIAGNOSIS — E1059 Type 1 diabetes mellitus with other circulatory complications: Secondary | ICD-10-CM

## 2023-06-19 ENCOUNTER — Other Ambulatory Visit: Payer: Self-pay | Admitting: Internal Medicine

## 2023-06-19 DIAGNOSIS — E1059 Type 1 diabetes mellitus with other circulatory complications: Secondary | ICD-10-CM

## 2023-06-25 ENCOUNTER — Encounter: Payer: Self-pay | Admitting: Cardiology

## 2023-06-25 ENCOUNTER — Ambulatory Visit: Payer: 59 | Attending: Cardiology | Admitting: Cardiology

## 2023-06-25 VITALS — BP 110/68 | HR 86 | Ht 64.0 in | Wt 142.2 lb

## 2023-06-25 DIAGNOSIS — F17200 Nicotine dependence, unspecified, uncomplicated: Secondary | ICD-10-CM

## 2023-06-25 DIAGNOSIS — E1042 Type 1 diabetes mellitus with diabetic polyneuropathy: Secondary | ICD-10-CM | POA: Diagnosis not present

## 2023-06-25 DIAGNOSIS — E1040 Type 1 diabetes mellitus with diabetic neuropathy, unspecified: Secondary | ICD-10-CM | POA: Diagnosis not present

## 2023-06-25 DIAGNOSIS — I959 Hypotension, unspecified: Secondary | ICD-10-CM | POA: Diagnosis not present

## 2023-06-25 DIAGNOSIS — I25119 Atherosclerotic heart disease of native coronary artery with unspecified angina pectoris: Secondary | ICD-10-CM | POA: Diagnosis not present

## 2023-06-25 NOTE — Progress Notes (Signed)
 Cardiology Office Note:    Date:  06/25/2023   ID:  Melody Myers, DOB 03-30-1980, MRN [id number]  PCP:  Eligio Grumbling, PA-C  Cardiologist:  Ralene Burger, MD    Referring MD: Vila Grayer*   Chief Complaint  Patient presents with   Follow-up    History of Present Illness:    Melody Myers is a 43 y.o. female   with past medical history significant for coronary artery disease.  In August 2020 she did have PTCA and stenting done of the obtuse marginal branch in face of acute myocardial infarction additional problem include type 1 diabetes, smoking likely she just recently quit, dyslipidemia, psychological issues. Last time I seen her she Complained of having chest pain.  In March we did stress test which showed no evidence of ischemia but because of her multiple risk factors for coronary artery disease we decided to proceed with cardiac catheterization to rule out obstructive disease.  Her left side cardiac catheterization was done on March 05, 2022 which showed proximal circumflex 35% stenosis, ramus 30% stenosis, mid LAD 40% stenosis proximal RCA mid RCA 40% stenosis, obtuse marginal 1 stent was widely patent ejection fraction was normal interestingly study appears to have less stenosis that noted previously in mid LAD and mid RCA. Comes today to months for follow-up.  Overall she is doing fine.  Denies have any chest pain tightness squeezing pressure burning chest.  Interestingly she was taking a lot of psychiatric medication she stopped them all and she feels dramatically better and have more energy no tiredness no weakness no fatigue  Past Medical History:  Diagnosis Date   Anal sphincter tear 01/22/2022   Anxiety    Asthma    Bipolar 1 disorder (HCC)    Bipolar depression (HCC) 03/20/2013   Carpal tunnel syndrome 02/04/2013   Formatting of this note might be different from the original. IMPRESSION: Order right CTI [id number]- ASAP   Chest pain 12/24/2018    Chronic pain 03/20/2013   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   Formatting of this note might be different from the original. Repaired at [facility] in Sept 2016.  Pt states that all problems are still present.  If followed by [facility] for this. She has f/u appt.  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 10/14/2018   Degenerative disc disease, lumbar 06/05/2015   Formatting of this note might be different from the original. L4-5 > L3-4, mild   Depression    patient reports bipolor   Depression    Diabetic neuropathy (HCC)    DKA (diabetic ketoacidoses) 03/19/2013   History of adenomatous polyp of colon 04/17/2015   Formatting of this note might be different from the original. Per path 2016   History of anxiety 04/17/2015   History of non-ST elevation myocardial infarction (NSTEMI) 04/30/2021   Hyperlipidemia LDL goal <70 10/04/2018   Hypokalemia 11/15/2014   Ingrown toenail of both feet 04/12/2019   Formatting of this note might be different from the original. great toenails bilateral   Insulin  dependent diabetes mellitus with complications    insulin  dependant   Jaw pain 02/24/2020   Keratoconjunctivitis sicca of both eyes not specified as Sjogren's 09/24/2016   Leukocytes in urine 06/08/2017   Leukocytosis 03/20/2013   Mild diastolic dysfunction 10/27/2020   Myocardial infarction (HCC) 10/02/2018   Myopia of both eyes 09/24/2016   Neuropathy    Non-ST elevation (NSTEMI) myocardial infarction (  HCC) 10/02/2018   NSTEMI (non-ST elevated myocardial infarction) (HCC) 10/02/2018   Spontaneous bruising 08/26/2018   SUI (stress urinary incontinence, female) 12/11/2016   Superficial incisional surgical site infection 11/14/2014   Syncopal episodes    Tobacco abuse 03/20/2013   Urinary incontinence 06/08/2017   Urinary retention 11/14/2014   Vitreous floater, bilateral 09/24/2016    Past Surgical  History:  Procedure Laterality Date   CORONARY STENT INTERVENTION N/A 10/02/2018   Procedure: CORONARY STENT INTERVENTION;  Surgeon: Sammy Crisp, MD;  Location: [facility] INVASIVE CV LAB;  Service: Cardiovascular;  Laterality: N/A;   LEFT HEART CATH AND CORONARY ANGIOGRAPHY N/A 10/02/2018   Procedure: LEFT HEART CATH AND CORONARY ANGIOGRAPHY;  Surgeon: Sammy Crisp, MD;  Location: [facility] INVASIVE CV LAB;  Service: Cardiovascular;  Laterality: N/A;   LEFT HEART CATH AND CORONARY ANGIOGRAPHY N/A 03/05/2022   Procedure: LEFT HEART CATH AND CORONARY ANGIOGRAPHY;  Surgeon: Lucendia Rusk, MD;  Location: [facility] INVASIVE CV LAB;  Service: Cardiovascular;  Laterality: N/A;   TUBAL LIGATION     VAGINA RECONSTRUCTION SURGERY  10/2014   & ana, reconstruction    Current Medications: Current Meds  Medication Sig   acetaminophen  (TYLENOL ) 325 MG tablet Take 650 mg by mouth every 6 (six) hours as needed for moderate pain or headache.    albuterol  (VENTOLIN  HFA) 108 (90 Base) MCG/ACT inhaler Inhale 2 puffs into the lungs every 6 (six) hours as needed for wheezing or shortness of breath.   aspirin  EC 81 MG EC tablet Take 1 tablet (81 mg total) by mouth daily.   atorvastatin  (LIPITOR ) 80 MG tablet Take 1 tablet (80 mg total) by mouth daily.   benzonatate (TESSALON) 200 MG capsule Take 200 mg by mouth 3 (three) times daily as needed for cough.   busPIRone (BUSPAR) 10 MG tablet Take 10 mg by mouth 2 (two) times daily.   cephALEXin (KEFLEX) 500 MG capsule Take 500 mg by mouth 2 (two) times daily.   clopidogrel  (PLAVIX ) 75 MG tablet Take 1 tablet (75 mg total) by mouth daily.   Continuous Glucose Sensor (DEXCOM G7 SENSOR) MISC APPLY 1 SENSOR EVERY 10 DAYS (Patient taking differently: 1 Application by Other route daily.)   DULoxetine (CYMBALTA) 60 MG capsule Take 60 mg by mouth daily.   estradiol (ESTRACE) 0.1 MG/GM vaginal cream Place 1 Applicatorful vaginally daily.   fluticasone  (FLONASE ) 50 MCG/ACT nasal spray  Place 2 sprays into both nostrils daily. 2 sprays each nostril at night (Patient taking differently: Place 2 sprays into both nostrils at bedtime.)   Fluticasone -Umeclidin-Vilant (TRELEGY ELLIPTA ) 100-62.5-25 MCG/ACT AEPB INHALE 1 PUFF INTO LUNGS ONCE DAILY (Patient taking differently: Inhale 1 puff into the lungs daily.)   gabapentin  (NEURONTIN ) 300 MG capsule Take 300 mg by mouth at bedtime.   Glucagon  3 MG/DOSE POWD Place 3 mg into the nose once as needed for up to 1 dose (Hypoglycemia).   Glycerin-Hypromellose-PEG 400 (DRY EYE RELIEF DROPS OP) Place 1 drop into both eyes daily as needed (dry eye).   Insulin  Glargine (BASAGLAR  KWIKPEN) 100 UNIT/ML Inject 28 Units into the skin daily.   Insulin  Infusion Pump (T:SLIM INSULIN  PUMP) DEVI 1 Dose by Does not apply route daily.   insulin  lispro (HUMALOG ) 100 UNIT/ML injection INJECT UP TO 100 UNITS IN THE INSULIN  PUMP A DAY (Patient taking differently: Inject 100 Units into the skin See admin instructions. INJECT UP TO 100 UNITS IN THE INSULIN  PUMP A DAY)   loperamide (IMODIUM) 2 MG capsule Take 2 mg  by mouth as needed for diarrhea or loose stools.   metoprolol  succinate (TOPROL  XL) 25 MG 24 hr tablet Take 1 tablet (25 mg total) by mouth daily.   montelukast  (SINGULAIR ) 10 MG tablet Take 1 tablet (10 mg total) by mouth at bedtime.   nicotine  (NICODERM CQ  - DOSED IN MG/24 HR) 7 mg/24hr patch Place 7 mg onto the skin daily.   nitroGLYCERIN  (NITROSTAT ) 0.4 MG SL tablet Place 1 tablet (0.4 mg total) under the tongue every 5 (five) minutes as needed for chest pain. MAX 3 tab / 15 min   OLANZapine (ZYPREXA) 2.5 MG tablet Take 2.5 mg by mouth at bedtime.   pantoprazole  (PROTONIX ) 40 MG tablet Take 1 tablet (40 mg total) by mouth daily.   rizatriptan  (MAXALT ) 5 MG tablet Take 1 tablet (5 mg total) by mouth as needed for migraine. May repeat in 2 hours if needed   topiramate (TOPAMAX) 25 MG tablet Take 25 mg by mouth 2 (two) times daily.   triamcinolone  cream  (KENALOG ) 0.1 % Apply 1 application topically 2 (two) times daily. (Patient taking differently: Apply 1 application  topically daily as needed (itching).)   Ubrogepant 50 MG TABS Take 50 mg by mouth 2 (two) times daily.     Allergies:   Cyclobenzaprine , Amoxicillin, Codeine, Penicillins, and Prednisone    Social History   Socioeconomic History   Marital status: Widowed    Spouse name: Not on file   Number of children: 2   Years of education: Not on file   Highest education level: Not on file  Occupational History   Occupation: Disabled  Tobacco Use   Smoking status: Every Day    Current packs/day: 0.25    Types: Cigarettes   Smokeless tobacco: Never   Tobacco comments:    2-3 cigarettes per day  Vaping Use   Vaping status: Never Used  Substance and Sexual Activity   Alcohol use: Not Currently    Comment: occasional   Drug use: No   Sexual activity: Yes    Comment: tubal ligation  Other Topics Concern   Not on file  Social History Narrative   Not on file   Social Drivers of Health   Financial Resource Strain: High Risk (10/02/2018)   Overall Financial Resource Strain (CARDIA)    Difficulty of Paying Living Expenses: Hard  Food Insecurity: Low Risk  (12/10/2022)   Received from [facility]   Hunger Vital Sign    Worried About Running Out of Food in the Last Year: Never true    Ran Out of Food in the Last Year: Never true  Transportation Needs: No Transportation Needs (12/10/2022)   Received from [facility]    In the past 12 months, has lack of reliable transportation kept you from medical appointments, meetings, work or from getting things needed for daily living? : No  Physical Activity: Insufficiently Active (10/02/2018)   Exercise Vital Sign    Days of Exercise per Week: 1 day    Minutes of Exercise per Session: 30 min  Stress: Stress Concern Present (10/02/2018)   [employer] of Occupational Health - Occupational Stress Questionnaire     Feeling of Stress : Very much  Social Connections: Unknown (06/26/2021)   [facility]   Social Network    Social Network: Not on file     Family History: The patient's family history includes COPD in her paternal grandmother; Diabetes in her maternal grandmother and sister; Heart  attack (age of onset: 28) in her brother; Heart attack (age of onset: 67) in her father; Heart disease in her brother. ROS:   Please see the history of present illness.    All 14 point review of systems negative except as described per history of present illness  EKGs/Labs/Other Studies Reviewed:    EKG Interpretation Date/Time:  Wednesday June 25 2023 08:29:05 EDT Ventricular Rate:  80 PR Interval:  128 QRS Duration:  88 QT Interval:  360 QTC Calculation: 415 R Axis:   23  Text Interpretation: Normal sinus rhythm Normal ECG When compared with ECG of 17-Oct-2022 19:44, PREVIOUS ECG IS PRESENT Confirmed by Ralene Burger [phone]) on 06/25/2023 8:32:57 AM    Recent Labs: 10/17/2022: Hemoglobin 12.8; Platelets 235 06/02/2023: ALT 14; BUN 7; Creat 0.56; Potassium 3.9; Sodium 138; TSH 1.39  Recent Lipid Panel    Component Value Date/Time   CHOL 128 06/02/2023 1520   CHOL 100 06/19/2020 0844   TRIG 96 06/02/2023 1520   HDL 54 06/02/2023 1520   HDL 40 06/19/2020 0844   CHOLHDL 2.4 06/02/2023 1520   VLDL 20.2 08/06/2021 0844   LDLCALC 56 06/02/2023 1520    Physical Exam:    VS:  BP 110/68 (BP Location: Right Arm, Patient Position: Sitting)   Pulse 86   Ht 5\' 4"  (1.626 m)   Wt 142 lb 3.2 oz (64.5 kg)   SpO2 94%   BMI 24.41 kg/m     Wt Readings from Last 3 Encounters:  06/25/23 142 lb 3.2 oz (64.5 kg)  06/02/23 142 lb 6.4 oz (64.6 kg)  12/25/22 140 lb (63.5 kg)     GEN:  Well nourished, well developed in no acute distress HEENT: Normal NECK: No JVD; No carotid bruits LYMPHATICS: No lymphadenopathy CARDIAC: RRR, no murmurs, no rubs, no gallops RESPIRATORY:   Clear to auscultation without rales, wheezing or rhonchi  ABDOMEN: Soft, non-tender, non-distended MUSCULOSKELETAL:  No edema; No deformity  SKIN: Warm and dry LOWER EXTREMITIES: no swelling NEUROLOGIC:  Alert and oriented x 3 PSYCHIATRIC:  Normal affect   ASSESSMENT:    1. Hypotension, unspecified hypotension type   2. Coronary artery disease involving native coronary artery of native heart with angina pectoris (HCC)   3. Diabetic peripheral neuropathy associated with type 1 diabetes mellitus (HCC)   4. Type 1 diabetes mellitus with diabetic neuropathy, unspecified (HCC)   5. Tobacco use disorder    PLAN:    In order of problems listed above:  Coronary disease stable from that point review on guideline directed medical therapy no signs and symptoms of reactivation of the problem. Dyslipidemia I did review K PN which show me her LDL of 56 HDL 54 this is from 02 June 2023 we will continue present management which include high intense statin for of Lipitor  80. Diabetes type 1.  She does have insulin  pump as well as CGM however last hemoglobin A1c 8.5 she understand is a problem trying to control it. Smoking she tells me that she smokes only 1 cigarettes a day I told her that she simply need to stop   Medication Adjustments/Labs and Tests Ordered: Current medicines are reviewed at length with the patient today.  Concerns regarding medicines are outlined above.  Orders Placed This Encounter  Procedures   EKG 12-Lead   Medication changes: No orders of the defined types were placed in this encounter.   Signed, Manfred Seed, MD, [facility] 06/25/2023 8:44 AM    [facility]

## 2023-06-25 NOTE — Patient Instructions (Signed)

## 2023-07-09 ENCOUNTER — Other Ambulatory Visit: Payer: Self-pay | Admitting: Cardiology

## 2023-07-29 ENCOUNTER — Other Ambulatory Visit: Payer: Self-pay | Admitting: Cardiology

## 2023-08-10 ENCOUNTER — Other Ambulatory Visit: Payer: Self-pay | Admitting: Cardiology

## 2023-08-27 ENCOUNTER — Other Ambulatory Visit: Payer: Self-pay | Admitting: Pulmonary Disease

## 2023-08-27 DIAGNOSIS — J4540 Moderate persistent asthma, uncomplicated: Secondary | ICD-10-CM

## 2023-08-31 ENCOUNTER — Telehealth: Payer: Self-pay | Admitting: Pulmonary Disease

## 2023-08-31 DIAGNOSIS — J4540 Moderate persistent asthma, uncomplicated: Secondary | ICD-10-CM

## 2023-08-31 MED ORDER — MONTELUKAST SODIUM 10 MG PO TABS: 10.0000 mg | ORAL_TABLET | Freq: Every day | ORAL | 3 refills | Status: AC

## 2023-08-31 NOTE — Telephone Encounter (Signed)
 [facility] note  Patient requesting refill on Singulair .  Order sent to her pharmacy.  Norberta Stobaugh MD [facility] See Amion for pager  If no response to pager , please call [phone] until 7pm After 7:00 pm call Elink  [phone] 08/31/2023, 2:08 PM

## 2023-09-17 ENCOUNTER — Telehealth: Payer: Self-pay

## 2023-09-17 NOTE — Telephone Encounter (Signed)
 Copied from CRM [id number]. Topic: Clinical - Medical Advice >> Sep 16, 2023 10:38 AM Melody Myers wrote: Reason for CRM: Reason for CRM: Patient would like to speak to Myers nurse in regard to her results from [facility] (chest xray) - her PCP states her lung was not passing air on the right side. Patient states she's had an 8 week cough that won't go away.   Callback number: [phone]     Spoke with patient Dr. Kara has not been able to review CT, pt verbalized understanding     NFN-

## 2023-09-17 NOTE — Telephone Encounter (Signed)
 Copied from CRM [id number]. Topic: Clinical - Medical Advice >> Sep 16, 2023 10:38 AM Melody Myers wrote: Reason for CRM: Reason for CRM: Patient would like to speak to Myers nurse in regard to her results from [facility] (chest xray) - her PCP states her lung was not passing air on the right side. Patient states she's had an 8 week cough that won't go away.   Callback number: [phone]

## 2023-09-17 NOTE — Telephone Encounter (Signed)
 Chest x-ray report viewed in care everywhere, report states no acute issues within the lungs. I am not able to see the images myself since it was done at [facility].  Patient seen 1 year ago, ok to schedule her for follow up visit.  JD

## 2023-09-18 NOTE — Telephone Encounter (Signed)
 Bad phone #. Got Recording. Sent letter.-TO

## 2023-09-18 NOTE — Telephone Encounter (Signed)
 Spoke with patient verbalized understanding   NFN

## 2023-09-21 ENCOUNTER — Encounter: Payer: Self-pay | Admitting: Cardiology

## 2023-09-24 ENCOUNTER — Encounter: Payer: Self-pay | Admitting: Pulmonary Disease

## 2023-09-24 ENCOUNTER — Telehealth: Payer: Self-pay

## 2023-09-24 ENCOUNTER — Ambulatory Visit: Admitting: Pulmonary Disease

## 2023-09-24 ENCOUNTER — Telehealth: Payer: Self-pay | Admitting: Pulmonary Disease

## 2023-09-24 VITALS — BP 118/74 | HR 123 | Ht 64.0 in | Wt 140.0 lb

## 2023-09-24 DIAGNOSIS — J4540 Moderate persistent asthma, uncomplicated: Secondary | ICD-10-CM | POA: Diagnosis not present

## 2023-09-24 DIAGNOSIS — R0602 Shortness of breath: Secondary | ICD-10-CM | POA: Diagnosis not present

## 2023-09-24 LAB — CBC WITH DIFFERENTIAL/PLATELET
Basophils Absolute: 0.1 K/uL (ref 0.0–0.1)
Basophils Relative: 0.6 % (ref 0.0–3.0)
Eosinophils Absolute: 0.1 K/uL (ref 0.0–0.7)
Eosinophils Relative: 1.1 % (ref 0.0–5.0)
HCT: 46.2 % — ABNORMAL HIGH (ref 36.0–46.0)
Hemoglobin: 15.4 g/dL — ABNORMAL HIGH (ref 12.0–15.0)
Lymphocytes Relative: 21.5 % (ref 12.0–46.0)
Lymphs Abs: 2.7 K/uL (ref 0.7–4.0)
MCHC: 33.4 g/dL (ref 30.0–36.0)
MCV: 87.1 fl (ref 78.0–100.0)
Monocytes Absolute: 0.6 K/uL (ref 0.1–1.0)
Monocytes Relative: 4.8 % (ref 3.0–12.0)
Neutro Abs: 9.1 K/uL — ABNORMAL HIGH (ref 1.4–7.7)
Neutrophils Relative %: 72 % (ref 43.0–77.0)
Platelets: 272 K/uL (ref 150.0–400.0)
RBC: 5.3 Mil/uL — ABNORMAL HIGH (ref 3.87–5.11)
RDW: 15 % (ref 11.5–15.5)
WBC: 12.6 K/uL — ABNORMAL HIGH (ref 4.0–10.5)

## 2023-09-24 LAB — COMPREHENSIVE METABOLIC PANEL WITH GFR
ALT: 13 U/L (ref 0–35)
AST: 15 U/L (ref 0–37)
Albumin: 4.3 g/dL (ref 3.5–5.2)
Alkaline Phosphatase: 85 U/L (ref 39–117)
BUN: 10 mg/dL (ref 6–23)
CO2: 29 meq/L (ref 19–32)
Calcium: 9.6 mg/dL (ref 8.4–10.5)
Chloride: 99 meq/L (ref 96–112)
Creatinine, Ser: 0.61 mg/dL (ref 0.40–1.20)
GFR: 111.1 mL/min (ref >60.00)
Glucose, Bld: 161 mg/dL — ABNORMAL HIGH (ref 70–99)
Potassium: 3.9 meq/L (ref 3.5–5.1)
Sodium: 134 meq/L — ABNORMAL LOW (ref 135–145)
Total Bilirubin: 0.5 mg/dL (ref 0.2–1.2)
Total Protein: 7.1 g/dL (ref 6.0–8.3)

## 2023-09-24 LAB — BRAIN NATRIURETIC PEPTIDE: Pro B Natriuretic peptide (BNP): 11 pg/mL (ref 0.0–100.0)

## 2023-09-24 NOTE — Telephone Encounter (Signed)
 LM- for patient to return call so we can find out where her Neb machine is from.

## 2023-09-24 NOTE — Telephone Encounter (Signed)
 Had to open a new encounter. Last one was signed. PT ret call. States she got the neb a long time ago. Past 5 years . She does not recall where she got it from but a family Dr. Rosa it for her.

## 2023-09-24 NOTE — Progress Notes (Unsigned)
 Synopsis: Referred in January 2023 for dyspnea by Garnette Simpler, MD  Subjective:   PATIENT ID: Melody Myers GENDER: female DOB: Sep 22, 1980, MRN: [id number]  HPI  Chief Complaint  Patient presents with   Medical Management of Chronic Issues    Pt states x 8 week sob, chest tightness , nothing helped Rx wise, cough at bedtime, neon green to whiteish  sputum    Melody Myers is a 43 year old woman, daily smoker with history of asthma, MI s/p stent placement in 2020 and covid infections x 2 who returns to pulmonary clinic for asthma.   She has experienced persistent respiratory symptoms for eight weeks following a respiratory infection. She has dyspnea with minimal exertion, significant chest tightness, and a 'crunchy feeling' when lying down. A persistent cough causes gagging and occurs both day and night. Despite using Trelegy, an albuterol  inhaler, and a nebulizer, she finds minimal relief.  She has been using prednisone  intermittently for over thirty days, which elevated her blood sugar levels to 500-600 mg/dL. Since stopping prednisone  three days ago, her blood sugar has improved to around 100 mg/dL.  She quit smoking over thirty days ago but has resumed smoking one cigarette in the morning due to anxiety. She has been using nicotine  patches but ran out recently. The heat exacerbates her symptoms, and she has been mostly bedridden for the past thirty days.  She denies swelling in her legs or abdomen. No significant change in other medications except for the addition of cough pills and prednisone .  OV 09/19/22 She had quit smoking for 2 months, but returned to smoking a few cigarettes per day. She used the patches to quit and is using patches intermittently.  She reports intermittent chest tightness and exertional shortness of breath. She does report anxiety and panic issues.   PFTs 5/24 are within normal limits.   OV 12/30/21 She continues on trelegy 1 puff daily. She does notice if  she forgets to use the trelegy that she feels worse and does not sleep as well. She is using albuterol  inhaler 1 to 2 times per day on average. She is smoking 2-3 cigarettes per day now.   OV 05/07/21 She was unable to complete pulmonary function testing today due to shortness of breath and cough.  She continues to smoke 3 to 4 cigarettes daily.  She was started on Trelegy Ellipta  1 puff daily at last visit with improvement in her shortness of breath.  She is using albuterol  1-2 times per week.  She continues to take Singulair  daily.  She is not using other allergy medicine at this time.  She is using Flonase  2 sprays per nostril daily.  OV 03/02/21 She reports having cough and dyspnea prior to covid in 2021 and 01/2021, but after the infections her symptoms have worsened.   She is currently using breo ellipta  1 puff daily and advair diskus 250-50mcg 1-2 puffs per day as needed. She is also using albuterol  inhaler and nebulizer treatments as needed.   She continues to smoke 3-5 cigarettes per day and is using e-cigarette daily. An e-cigarette will last her 2-3 weeks. She started smoking at age 39. She was previously smoking 2 packs per day. She quit smoking for 6 months in 2020 after suffering MI. She lives with her sister who smokes. She grew up with significant second hand smoke exposure.   Past Medical History:  Diagnosis Date   Anal sphincter tear 01/22/2022   Anxiety    Asthma  Bipolar 1 disorder (HCC)    Bipolar depression (HCC) 03/20/2013   Carpal tunnel syndrome 02/04/2013   Formatting of this note might be different from the original. IMPRESSION: Order right CTI 79473- ASAP   Chest pain 12/24/2018   Chronic pain 03/20/2013   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   Formatting of this note might be different from the original. Repaired at [facility] in Sept 2016.  Pt states that all problems are still present.  If followed by [facility] for this. She has f/u appt.  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 10/14/2018   Degenerative disc disease, lumbar 06/05/2015   Formatting of this note might be different from the original. L4-5 > L3-4, mild   Depression    patient reports bipolor   Depression    Diabetic neuropathy (HCC)    DKA (diabetic ketoacidoses) 03/19/2013   History of adenomatous polyp of colon 04/17/2015   Formatting of this note might be different from the original. Per path 2016   History of anxiety 04/17/2015   History of non-ST elevation myocardial infarction (NSTEMI) 04/30/2021   Hyperlipidemia LDL goal <70 10/04/2018   Hypokalemia 11/15/2014   Ingrown toenail of both feet 04/12/2019   Formatting of this note might be different from the original. great toenails bilateral   Insulin  dependent diabetes mellitus with complications    insulin  dependant   Jaw pain 02/24/2020   Keratoconjunctivitis sicca of both eyes not specified as Sjogren's 09/24/2016   Leukocytes in urine 06/08/2017   Leukocytosis 03/20/2013   Mild diastolic dysfunction 10/27/2020   Myocardial infarction (HCC) 10/02/2018   Myopia of both eyes 09/24/2016   Neuropathy    Non-ST elevation (NSTEMI) myocardial infarction (HCC) 10/02/2018   NSTEMI (non-ST elevated myocardial infarction) (HCC) 10/02/2018   Spontaneous bruising 08/26/2018   SUI (stress urinary incontinence, female) 12/11/2016   Superficial incisional surgical site infection 11/14/2014   Syncopal episodes    Tobacco abuse 03/20/2013   Urinary incontinence 06/08/2017   Urinary retention 11/14/2014   Vitreous floater, bilateral 09/24/2016     Family History  Problem Relation Age of Onset   Heart attack Father 74   Diabetes Sister    Heart attack Brother 29   Heart disease Brother    Diabetes Maternal Grandmother    COPD Paternal Grandmother      Social History   Socioeconomic History   Marital status: Widowed    Spouse name: Not  on file   Number of children: 2   Years of education: Not on file   Highest education level: Not on file  Occupational History   Occupation: Disabled  Tobacco Use   Smoking status: Every Day    Current packs/day: 0.25    Types: Cigarettes   Smokeless tobacco: Never   Tobacco comments:    2-3 cigarettes per day  Vaping Use   Vaping status: Never Used  Substance and Sexual Activity   Alcohol use: Not Currently    Comment: occasional   Drug use: No   Sexual activity: Yes    Comment: tubal ligation  Other Topics Concern   Not on file  Social History Narrative   Not on file   Social Drivers of Health   Financial Resource Strain: High Risk (10/02/2018)   Overall Financial Resource Strain (CARDIA)    Difficulty of Paying Living Expenses: Hard  Food Insecurity: Low Risk  (12/10/2022)   Received from [facility]  Hunger Vital Sign    Within the past 12 months, you worried that your food would run out before you got money to buy more: Never true    Within the past 12 months, the food you bought just didn't last and you didn't have money to get more. : Never true  Transportation Needs: No Transportation Needs (12/10/2022)   Received from [facility]    In the past 12 months, has lack of reliable transportation kept you from medical appointments, meetings, work or from getting things needed for daily living? : No  Physical Activity: Insufficiently Active (10/02/2018)   Exercise Vital Sign    Days of Exercise per Week: 1 day    Minutes of Exercise per Session: 30 min  Stress: Stress Concern Present (10/02/2018)   [employer] of Occupational [employer] - Occupational Stress Questionnaire    Feeling of Stress : Very much  Social Connections: Unknown (06/26/2021)   Received from [facility]   Social Network    Social Network: Not on file  Intimate Partner Violence: Unknown (05/30/2021)   Received from [facility]   HITS    Physically Hurt: Not on file     Insult or Talk Down To: Not on file    Threaten Physical Harm: Not on file    Scream or Curse: Not on file     Allergies  Allergen Reactions   Cyclobenzaprine  Itching   Amoxicillin Itching   Codeine Nausea Only   Penicillins Hives, Itching and Swelling    Did it involve swelling of the face/tongue/throat, SOB, or low BP? No Did it involve sudden or severe rash/hives, skin peeling, or any reaction on the inside of your mouth or nose? No Did you need to seek medical attention at a hospital or doctor's office? No When did it last happen?      Childhood If all above answers are NO, may proceed with cephalosporin use.   Prednisone  Itching and Swelling     Outpatient Medications Prior to Visit  Medication Sig Dispense Refill   acetaminophen  (TYLENOL ) 325 MG tablet Take 650 mg by mouth every 6 (six) hours as needed for moderate pain or headache.      albuterol  (VENTOLIN  HFA) 108 (90 Base) MCG/ACT inhaler Inhale 2 puffs into the lungs every 6 (six) hours as needed for wheezing or shortness of breath. 8 g 1   aspirin  EC 81 MG EC tablet Take 1 tablet (81 mg total) by mouth daily.     atorvastatin  (LIPITOR ) 80 MG tablet Take 1 tablet by mouth once daily 90 tablet 2   benzonatate (TESSALON) 200 MG capsule Take 200 mg by mouth 3 (three) times daily as needed for cough.     busPIRone (BUSPAR) 10 MG tablet Take 10 mg by mouth 2 (two) times daily.     cephALEXin (KEFLEX) 500 MG capsule Take 500 mg by mouth 2 (two) times daily.     clopidogrel  (PLAVIX ) 75 MG tablet Take 1 tablet (75 mg total) by mouth daily. 90 tablet 2   Continuous Glucose Sensor (DEXCOM G7 SENSOR) MISC APPLY 1 SENSOR EVERY 10 DAYS (Patient taking differently: 1 Application by Other route daily.) 6 each 1   DULoxetine (CYMBALTA) 60 MG capsule Take 60 mg by mouth daily.     estradiol (ESTRACE) 0.1 MG/GM vaginal cream Place 1 Applicatorful vaginally daily.     fluticasone  (FLONASE ) 50 MCG/ACT nasal spray Place 2 sprays into both  nostrils daily. 2 sprays each nostril  at night (Patient taking differently: Place 2 sprays into both nostrils at bedtime.) 16 g 6   Fluticasone -Umeclidin-Vilant (TRELEGY ELLIPTA ) 100-62.5-25 MCG/ACT AEPB INHALE 1 PUFF INTO LUNGS ONCE DAILY (Patient taking differently: Inhale 1 puff into the lungs daily.) 60 each 11   gabapentin  (NEURONTIN ) 300 MG capsule Take 300 mg by mouth at bedtime.     Glucagon  3 MG/DOSE POWD Place 3 mg into the nose once as needed for up to 1 dose (Hypoglycemia).     Glycerin-Hypromellose-PEG 400 (DRY EYE RELIEF DROPS OP) Place 1 drop into both eyes daily as needed (dry eye).     Insulin  Glargine (BASAGLAR  KWIKPEN) 100 UNIT/ML Inject 28 Units into the skin daily. 15 mL 1   Insulin  Infusion Pump (T:SLIM INSULIN  PUMP) DEVI 1 Dose by Does not apply route daily.     insulin  lispro (HUMALOG ) 100 UNIT/ML injection INJECT UP TO 100 UNITS IN THE INSULIN  PUMP A DAY (Patient taking differently: Inject 100 Units into the skin See admin instructions. INJECT UP TO 100 UNITS IN THE INSULIN  PUMP A DAY) 90 mL 3   loperamide (IMODIUM) 2 MG capsule Take 2 mg by mouth as needed for diarrhea or loose stools.     metoprolol  succinate (TOPROL -XL) 25 MG 24 hr tablet Take 1 tablet by mouth once daily 90 tablet 3   montelukast  (SINGULAIR ) 10 MG tablet Take 1 tablet (10 mg total) by mouth at bedtime. 90 tablet 3   nicotine  (NICODERM CQ  - DOSED IN MG/24 HR) 7 mg/24hr patch Place 7 mg onto the skin daily.     nitroGLYCERIN  (NITROSTAT ) 0.4 MG SL tablet Place 1 tablet (0.4 mg total) under the tongue every 5 (five) minutes as needed for chest pain. MAX 3 tab / 15 min 25 tablet 11   OLANZapine (ZYPREXA) 2.5 MG tablet Take 2.5 mg by mouth at bedtime.     pantoprazole  (PROTONIX ) 40 MG tablet Take 1 tablet (40 mg total) by mouth daily. 90 tablet 3   rizatriptan  (MAXALT ) 5 MG tablet Take 1 tablet (5 mg total) by mouth as needed for migraine. May repeat in 2 hours if needed 10 tablet 2   topiramate (TOPAMAX) 25 MG  tablet Take 25 mg by mouth 2 (two) times daily.     triamcinolone  cream (KENALOG ) 0.1 % Apply 1 application topically 2 (two) times daily. (Patient taking differently: Apply 1 application  topically daily as needed (itching).) 30 g 0   Ubrogepant 50 MG TABS Take 50 mg by mouth 2 (two) times daily.     azithromycin (ZITHROMAX) 250 MG tablet Take 250 mg by mouth as directed. (Patient not taking: Reported on 09/24/2023)     predniSONE  (DELTASONE ) 20 MG tablet Take 40 mg by mouth daily. (Patient not taking: Reported on 09/24/2023)     No facility-administered medications prior to visit.   Review of Systems  Constitutional:  Negative for chills, fever, malaise/fatigue and weight loss.  HENT:  Negative for congestion, sinus pain and sore throat.   Eyes: Negative.   Respiratory:  Positive for shortness of breath. Negative for cough, hemoptysis, sputum production and wheezing.   Cardiovascular:  Positive for chest pain (tightness). Negative for palpitations, orthopnea, claudication and leg swelling.  Gastrointestinal:  Negative for abdominal pain, heartburn, nausea and vomiting.  Genitourinary: Negative.   Musculoskeletal:  Negative for joint pain and myalgias.  Skin:  Negative for rash.  Neurological:  Negative for weakness.  Psychiatric/Behavioral: Negative.      Objective:   Vitals:  09/24/23 1042  BP: 118/74  Pulse: (!) 123  SpO2: 100%  Weight: 140 lb (63.5 kg)  Height: 5' 4 (1.626 m)    Physical Exam Constitutional:      General: She is not in acute distress.    Appearance: She is not ill-appearing.  HENT:     Head: Normocephalic and atraumatic.  Eyes:     Conjunctiva/sclera: Conjunctivae normal.  Cardiovascular:     Rate and Rhythm: Regular rhythm. Tachycardia present.     Pulses: Normal pulses.     Heart sounds: Normal heart sounds. No murmur heard. Pulmonary:     Effort: Pulmonary effort is normal.     Breath sounds: No wheezing, rhonchi or rales.  Musculoskeletal:      Right lower leg: No edema.     Left lower leg: No edema.  Skin:    General: Skin is warm and dry.  Neurological:     Mental Status: She is alert.  Psychiatric:        Thought Content: Thought content normal.    CBC    Component Value Date/Time   WBC 12.6 (H) 09/24/2023 1155   RBC 5.30 (H) 09/24/2023 1155   HGB 15.4 (H) 09/24/2023 1155   HGB 13.9 03/01/2022 0905   HCT 46.2 (H) 09/24/2023 1155   HCT 41.6 03/01/2022 0905   PLT 272.0 09/24/2023 1155   PLT 270 03/01/2022 0905   MCV 87.1 09/24/2023 1155   MCV 88 03/01/2022 0905   MCH 29.6 10/17/2022 1954   MCHC 33.4 09/24/2023 1155   RDW 15.0 09/24/2023 1155   RDW 12.2 03/01/2022 0905   LYMPHSABS 2.7 09/24/2023 1155   MONOABS 0.6 09/24/2023 1155   EOSABS 0.1 09/24/2023 1155   BASOSABS 0.1 09/24/2023 1155      Latest Ref Rng & Units 09/24/2023   11:55 AM 06/02/2023    3:20 PM 10/17/2022    7:54 PM  BMP  Glucose 70 - 99 mg/dL 838  874  843   BUN 6 - 23 mg/dL 10  7  7    Creatinine 0.40 - 1.20 mg/dL 9.38  9.43  9.38   BUN/Creat Ratio 6 - 22 (calc)  SEE NOTE:    Sodium 135 - 145 mEq/L 134  138  132   Potassium 3.5 - 5.1 mEq/L 3.9  3.9  3.2   Chloride 96 - 112 mEq/L 99  103  98   CO2 19 - 32 mEq/L 29  29  25    Calcium  8.4 - 10.5 mg/dL 9.6  9.9  8.4    Chest imaging: CXR 09/16/23 Cardiovascular: Cardiac silhouette and pulmonary vasculature are within normal limits.  Mediastinum: Within normal limits.  Lungs/pleura: No pulmonary consolidation or edema. No pleural effusion or pneumothorax.  Upper abdomen: Visualized portions are unremarkable.  Chest wall/osseous structures: No acute fracture.   CXR 12/18/21 The heart size and mediastinal contours are within normal limits. Both lungs are clear. The visualized skeletal structures are unremarkable.  CTA Chest 01/08/21 Cardiovascular: Satisfactory opacification of the pulmonary arteries to the segmental level. No evidence of pulmonary embolism. Thoracic aorta is normal in  course and caliber. Normal heart size. No pericardial effusion.   Mediastinum/Nodes: No enlarged mediastinal, hilar, or axillary lymph nodes. Thyroid gland, trachea, and esophagus demonstrate no significant findings.   Lungs/Pleura: No focal airspace consolidation. No pleural effusion or pneumothorax.  CT Chest 2021 Mild apical predominant emphysema.  PFT:    Latest Ref Rng & Units 07/25/2022   10:52 AM 05/07/2021  1:51 PM  PFT Results  FVC-Pre L 3.43    FVC-Predicted Pre % 99    FVC-Post L 3.35    FVC-Predicted Post % 96    Pre FEV1/FVC % % 80    Post FEV1/FCV % % 80    FEV1-Pre L 2.74    FEV1-Predicted Pre % 97    FEV1-Post L 2.69    DLCO uncorrected ml/min/mmHg 15.87  15.34   DLCO UNC% % 77  69   DLCO corrected ml/min/mmHg 15.78  15.34   DLCO COR %Predicted % 77  69   DLVA Predicted % 74  75   TLC L 4.18  8.09   TLC % Predicted % 87  160   RV % Predicted % 54  305    Labs:  Path:  Echo 10/17/20: LV EF 60-65%. Grade I diastolic dysfunction. RV systolic function is normal.   Heart Catheterization:  Assessment & Plan:   Moderate persistent asthma, unspecified whether complicated  Shortness of breath - Plan: Comp Met (CMET), CBC with Differential/Platelet, B Nat Peptide, EKG 12-Lead, Comp Met (CMET), CBC with Differential/Platelet, B Nat Peptide  Discussion: Melody Myers is a 43 year old woman, daily smoker with history of asthma, MI s/p stent placement in 2020 and covid infections x 2 who returns to pulmonary clinic for asthma.  Moderate Persistent Asthma - she has been treated for exacerbation over recent weeks with multiple rounds of prednisone  and antibiotics without improvement. No wheezing on exam today.  - She is to continue trelegy ellipta  1 puff daily - recommended avoiding further prednisone  at this time given her DMI - chest radiograph from 7/22 is unremarkable  Chest Pain - EKG today is unremarkable, no ischemic changes - Check CBC, CMP and  BNP  Cigarette smoker - recommend smoking cessation  Follow-up in 4 months.  Dorn Chill, MD [facility] Office: [phone]    Current Outpatient Medications:    acetaminophen  (TYLENOL ) 325 MG tablet, Take 650 mg by mouth every 6 (six) hours as needed for moderate pain or headache. , Disp: , Rfl:    albuterol  (VENTOLIN  HFA) 108 (90 Base) MCG/ACT inhaler, Inhale 2 puffs into the lungs every 6 (six) hours as needed for wheezing or shortness of breath., Disp: 8 g, Rfl: 1   aspirin  EC 81 MG EC tablet, Take 1 tablet (81 mg total) by mouth daily., Disp:  , Rfl:    atorvastatin  (LIPITOR ) 80 MG tablet, Take 1 tablet by mouth once daily, Disp: 90 tablet, Rfl: 2   benzonatate (TESSALON) 200 MG capsule, Take 200 mg by mouth 3 (three) times daily as needed for cough., Disp: , Rfl:    busPIRone (BUSPAR) 10 MG tablet, Take 10 mg by mouth 2 (two) times daily., Disp: , Rfl:    cephALEXin (KEFLEX) 500 MG capsule, Take 500 mg by mouth 2 (two) times daily., Disp: , Rfl:    clopidogrel  (PLAVIX ) 75 MG tablet, Take 1 tablet (75 mg total) by mouth daily., Disp: 90 tablet, Rfl: 2   Continuous Glucose Sensor (DEXCOM G7 SENSOR) MISC, APPLY 1 SENSOR EVERY 10 DAYS (Patient taking differently: 1 Application by Other route daily.), Disp: 6 each, Rfl: 1   DULoxetine (CYMBALTA) 60 MG capsule, Take 60 mg by mouth daily., Disp: , Rfl:    estradiol (ESTRACE) 0.1 MG/GM vaginal cream, Place 1 Applicatorful vaginally daily., Disp: , Rfl:    fluticasone  (FLONASE ) 50 MCG/ACT nasal spray, Place 2 sprays into both nostrils daily. 2 sprays  each nostril at night (Patient taking differently: Place 2 sprays into both nostrils at bedtime.), Disp: 16 g, Rfl: 6   Fluticasone -Umeclidin-Vilant (TRELEGY ELLIPTA ) 100-62.5-25 MCG/ACT AEPB, INHALE 1 PUFF INTO LUNGS ONCE DAILY (Patient taking differently: Inhale 1 puff into the lungs daily.), Disp: 60 each, Rfl: 11   gabapentin  (NEURONTIN ) 300 MG capsule, Take 300 mg  by mouth at bedtime., Disp: , Rfl:    Glucagon  3 MG/DOSE POWD, Place 3 mg into the nose once as needed for up to 1 dose (Hypoglycemia)., Disp: , Rfl:    Glycerin-Hypromellose-PEG 400 (DRY EYE RELIEF DROPS OP), Place 1 drop into both eyes daily as needed (dry eye)., Disp: , Rfl:    Insulin  Glargine (BASAGLAR  KWIKPEN) 100 UNIT/ML, Inject 28 Units into the skin daily., Disp: 15 mL, Rfl: 1   Insulin  Infusion Pump (T:SLIM INSULIN  PUMP) DEVI, 1 Dose by Does not apply route daily., Disp: , Rfl:    insulin  lispro (HUMALOG ) 100 UNIT/ML injection, INJECT UP TO 100 UNITS IN THE INSULIN  PUMP A DAY (Patient taking differently: Inject 100 Units into the skin See admin instructions. INJECT UP TO 100 UNITS IN THE INSULIN  PUMP A DAY), Disp: 90 mL, Rfl: 3   loperamide (IMODIUM) 2 MG capsule, Take 2 mg by mouth as needed for diarrhea or loose stools., Disp: , Rfl:    metoprolol  succinate (TOPROL -XL) 25 MG 24 hr tablet, Take 1 tablet by mouth once daily, Disp: 90 tablet, Rfl: 3   montelukast  (SINGULAIR ) 10 MG tablet, Take 1 tablet (10 mg total) by mouth at bedtime., Disp: 90 tablet, Rfl: 3   nicotine  (NICODERM CQ  - DOSED IN MG/24 HR) 7 mg/24hr patch, Place 7 mg onto the skin daily., Disp: , Rfl:    nitroGLYCERIN  (NITROSTAT ) 0.4 MG SL tablet, Place 1 tablet (0.4 mg total) under the tongue every 5 (five) minutes as needed for chest pain. MAX 3 tab / 15 min, Disp: 25 tablet, Rfl: 11   OLANZapine (ZYPREXA) 2.5 MG tablet, Take 2.5 mg by mouth at bedtime., Disp: , Rfl:    pantoprazole  (PROTONIX ) 40 MG tablet, Take 1 tablet (40 mg total) by mouth daily., Disp: 90 tablet, Rfl: 3   rizatriptan  (MAXALT ) 5 MG tablet, Take 1 tablet (5 mg total) by mouth as needed for migraine. May repeat in 2 hours if needed, Disp: 10 tablet, Rfl: 2   topiramate (TOPAMAX) 25 MG tablet, Take 25 mg by mouth 2 (two) times daily., Disp: , Rfl:    triamcinolone  cream (KENALOG ) 0.1 %, Apply 1 application topically 2 (two) times daily. (Patient taking  differently: Apply 1 application  topically daily as needed (itching).), Disp: 30 g, Rfl: 0   Ubrogepant 50 MG TABS, Take 50 mg by mouth 2 (two) times daily., Disp: , Rfl:

## 2023-09-24 NOTE — Patient Instructions (Addendum)
 Continue trelegy ellipta  1 puff daily - rinse mouth out after each use  We will check labs today including CBC, CMP, BNP  We will check an EKG today  Hold off on further prednisone  at this time until we have labs back.   Your recent chest x-ray from [facility] 7/22 appears to be normal based on the report  Will message you with the lab results.  Follow up in 4 months, call sooner if needed

## 2023-09-25 ENCOUNTER — Telehealth: Payer: Self-pay

## 2023-09-25 ENCOUNTER — Encounter: Payer: Self-pay | Admitting: Pulmonary Disease

## 2023-09-25 ENCOUNTER — Ambulatory Visit: Payer: Self-pay | Admitting: Pulmonary Disease

## 2023-09-25 NOTE — Telephone Encounter (Signed)
 Copied from CRM [id number]. Topic: Clinical - Lab/Test Results >> Sep 25, 2023  9:30 AM Leila BROCKS wrote: Reason for CRM: Patient [phone] wants labs test results. Please advise and call back.   Waiting on MD to review results    NFN-

## 2023-09-25 NOTE — Telephone Encounter (Signed)
 Spoke with patient regarding sending order in for nebulizer supplies. Patient stated she wanted her result's on her labs done the other day .  Dr.Dewald can you please advise

## 2023-09-26 NOTE — Telephone Encounter (Signed)
 I called and spoke to pt. Pt informed of Dr Reine Caraway message and verbalized understanding. NFN

## 2023-10-02 ENCOUNTER — Ambulatory Visit: Admitting: Internal Medicine

## 2023-10-02 NOTE — Progress Notes (Deleted)
 Patient ID: Melody Myers, female   DOB: [birth year], 43 y.o.   MRN: [id number]  HPI: Melody Myers is a 43 y.o.-year-old female, returning for follow-up for DM1, diagnosed in 2001, uncontrolled, with complications (history of DKA, CAD, NPDR, gastroparesis, PN, history of foot ulcer).  She previously saw [facility] and Dr. Kassie here in the practice, but last visit with me about was 4 months ago.  Previous visit was 1 year prior.  Interim history: She has increased urination, blurry vision, nausea. She has depression >> on Cymbalta.   Reviewed HbA1c levels: Lab Results  Component Value Date   HGBA1C 8.5 (A) 06/02/2023   HGBA1C 8.3 (A) 05/30/2022   HGBA1C 7.7 (A) 02/28/2022  12/11/2022: HbA1c 7.8% 07/08/2022: HbA1c 7.8%  Insulin  pump: - previously on insulin  pump (Omnipod) in 2013 to early 2022.  She stopped when she changed endocrinologists. - then t:slim X2 - started 1st week of 01/2022 - on Omnipod 5 >>  now T:slim  CGM: -Dexcom G6 >> G7  Insulin : -NovoLog  >> now Humalog  per insurance preference  Supplier: - [employer]  Backup regimen: - Novolog  10-12 units, ICR 1:2 carb equiv., target 110, ISF ~25 - Basaglar  28 units hs  On insulin  pump: - Basal rates: 12 am: 1.1 units/h - Insulin  to carb ratio: 12 am: 1:13 >> 1:15 - Target: 12 am: 110 >> 120 - Correction factor (insulin  sensitivity factor):  12 am: 1:100  - Active insulin  time: 5h  Total daily dose from basal insulin : 70% >> 66% (34 units) Total daily dose from bolus insulin : 30% >> 34% (18 units) In the last 2 weeks, she was in the auto mode (control IQ) 100% of the time.  Meter: Accu-Chek  Pt checks her sugars more than 4 times a day with her CGM:  Previously:  Previously    Lowest sugar was 37 (off pump) >> 55 >> 40s; she has hypoglycemia awareness at 60.  She has a glucagon  kit at home. No previous hypoglycemia admission.  Highest sugar was in the 500s >> .SABRA. 400s.  She has a history of  DKA. On 02/17/2023, glucose was 684 after she had bent cannula.  She had DKA symptoms at that time.   Pt's meals are usually small.  - no CKD, last BUN/creatinine:  Lab Results  Component Value Date   BUN 10 09/24/2023   BUN 7 06/02/2023   CREATININE 0.61 09/24/2023   CREATININE 0.56 06/02/2023   Lab Results  Component Value Date   MICRALBCREAT NOTE 06/02/2023   Lab Results  Component Value Date   CHOL 128 06/02/2023   HDL 54 06/02/2023   LDLCALC 56 06/02/2023   TRIG 96 06/02/2023   CHOLHDL 2.4 06/02/2023  She is is on Lipitor  80 mg daily.  - last eye exam was in 02/2022: No DR reportedly.  - + numbness and tingling in her feet.  Last foot exam 06/02/2023.  TSH: Lab Results  Component Value Date   TSH 1.39 06/02/2023    She has a history of bipolar disease.  ROS: + see HPI  Past Medical History:  Diagnosis Date   Anal sphincter tear 01/22/2022   Anxiety    Asthma    Bipolar 1 disorder (HCC)    Bipolar depression (HCC) 03/20/2013   Carpal tunnel syndrome 02/04/2013   Formatting of this note might be different from the original. IMPRESSION: Order right CTI 79473- ASAP   Chest pain 12/24/2018   Chronic pain 03/20/2013   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  Formatting of this note might be different from the original. Repaired at [facility] in Sept 2016.  Pt states that all problems are still present.  If followed by [facility] for this. She has f/u appt.  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 10/14/2018   Degenerative disc disease, lumbar 06/05/2015   Formatting of this note might be different from the original. L4-5 > L3-4, mild   Depression    patient reports bipolor   Depression    Diabetic neuropathy (HCC)    DKA (diabetic ketoacidoses) 03/19/2013   History of adenomatous polyp of colon 04/17/2015   Formatting of this note might be different from the original. Per path  2016   History of anxiety 04/17/2015   History of non-ST elevation myocardial infarction (NSTEMI) 04/30/2021   Hyperlipidemia LDL goal <70 10/04/2018   Hypokalemia 11/15/2014   Ingrown toenail of both feet 04/12/2019   Formatting of this note might be different from the original. great toenails bilateral   Insulin  dependent diabetes mellitus with complications    insulin  dependant   Jaw pain 02/24/2020   Keratoconjunctivitis sicca of both eyes not specified as Sjogren's 09/24/2016   Leukocytes in urine 06/08/2017   Leukocytosis 03/20/2013   Mild diastolic dysfunction 10/27/2020   Myocardial infarction (HCC) 10/02/2018   Myopia of both eyes 09/24/2016   Neuropathy    Non-ST elevation (NSTEMI) myocardial infarction (HCC) 10/02/2018   NSTEMI (non-ST elevated myocardial infarction) (HCC) 10/02/2018   Spontaneous bruising 08/26/2018   SUI (stress urinary incontinence, female) 12/11/2016   Superficial incisional surgical site infection 11/14/2014   Syncopal episodes    Tobacco abuse 03/20/2013   Urinary incontinence 06/08/2017   Urinary retention 11/14/2014   Vitreous floater, bilateral 09/24/2016   Past Surgical History:  Procedure Laterality Date   CORONARY STENT INTERVENTION N/A 10/02/2018   Procedure: CORONARY STENT INTERVENTION;  Surgeon: Mady Bruckner, MD;  Location: [facility] INVASIVE CV LAB;  Service: Cardiovascular;  Laterality: N/A;   LEFT HEART CATH AND CORONARY ANGIOGRAPHY N/A 10/02/2018   Procedure: LEFT HEART CATH AND CORONARY ANGIOGRAPHY;  Surgeon: Mady Bruckner, MD;  Location: [facility] INVASIVE CV LAB;  Service: Cardiovascular;  Laterality: N/A;   LEFT HEART CATH AND CORONARY ANGIOGRAPHY N/A 03/05/2022   Procedure: LEFT HEART CATH AND CORONARY ANGIOGRAPHY;  Surgeon: Dann Candyce RAMAN, MD;  Location: [facility] INVASIVE CV LAB;  Service: Cardiovascular;  Laterality: N/A;   TUBAL LIGATION     VAGINA RECONSTRUCTION SURGERY  10/2014   & ana, reconstruction   Social History    Socioeconomic History   Marital status: Widowed    Spouse name: Not on file   Number of children: 2   Years of education: Not on file   Highest education level: Not on file  Occupational History   Occupation: Disabled  Tobacco Use   Smoking status: Every Day    Current packs/day: 0.25    Types: Cigarettes   Smokeless tobacco: Never   Tobacco comments:    2-3 cigarettes per day  Vaping Use   Vaping status: Never Used  Substance and Sexual Activity   Alcohol use: Not Currently    Comment: occasional   Drug use: No   Sexual activity: Yes    Comment: tubal ligation  Other Topics Concern   Not on file  Social History Narrative   Not on file   Social Drivers of Health   Financial Resource Strain: High Risk (10/02/2018)  Overall Financial Resource Strain (CARDIA)    Difficulty of Paying Living Expenses: Hard  Food Insecurity: Low Risk  (12/10/2022)   Received from [facility]   Hunger Vital Sign    Within the past 12 months, you worried that your food would run out before you got money to buy more: Never true    Within the past 12 months, the food you bought just didn't last and you didn't have money to get more. : Never true  Transportation Needs: No Transportation Needs (12/10/2022)   Received from [facility]    In the past 12 months, has lack of reliable transportation kept you from medical appointments, meetings, work or from getting things needed for daily living? : No  Physical Activity: Insufficiently Active (10/02/2018)   Exercise Vital Sign    Days of Exercise per Week: 1 day    Minutes of Exercise per Session: 30 min  Stress: Stress Concern Present (10/02/2018)   [employer] of Occupational Health - Occupational Stress Questionnaire    Feeling of Stress : Very much  Social Connections: Unknown (06/26/2021)   Received from [facility]   Social Network    Social Network: Not on file  Intimate Partner Violence: Unknown (05/30/2021)    Received from [facility]   HITS    Physically Hurt: Not on file    Insult or Talk Down To: Not on file    Threaten Physical Harm: Not on file    Scream or Curse: Not on file   Current Outpatient Medications on File Prior to Visit  Medication Sig Dispense Refill   acetaminophen  (TYLENOL ) 325 MG tablet Take 650 mg by mouth every 6 (six) hours as needed for moderate pain or headache.      albuterol  (VENTOLIN  HFA) 108 (90 Base) MCG/ACT inhaler Inhale 2 puffs into the lungs every 6 (six) hours as needed for wheezing or shortness of breath. 8 g 1   aspirin  EC 81 MG EC tablet Take 1 tablet (81 mg total) by mouth daily.     atorvastatin  (LIPITOR ) 80 MG tablet Take 1 tablet by mouth once daily 90 tablet 2   benzonatate (TESSALON) 200 MG capsule Take 200 mg by mouth 3 (three) times daily as needed for cough.     busPIRone (BUSPAR) 10 MG tablet Take 10 mg by mouth 2 (two) times daily.     cephALEXin (KEFLEX) 500 MG capsule Take 500 mg by mouth 2 (two) times daily.     clopidogrel  (PLAVIX ) 75 MG tablet Take 1 tablet (75 mg total) by mouth daily. 90 tablet 2   Continuous Glucose Sensor (DEXCOM G7 SENSOR) MISC APPLY 1 SENSOR EVERY 10 DAYS (Patient taking differently: 1 Application by Other route daily.) 6 each 1   DULoxetine (CYMBALTA) 60 MG capsule Take 60 mg by mouth daily.     estradiol (ESTRACE) 0.1 MG/GM vaginal cream Place 1 Applicatorful vaginally daily.     fluticasone  (FLONASE ) 50 MCG/ACT nasal spray Place 2 sprays into both nostrils daily. 2 sprays each nostril at night (Patient taking differently: Place 2 sprays into both nostrils at bedtime.) 16 g 6   Fluticasone -Umeclidin-Vilant (TRELEGY ELLIPTA ) 100-62.5-25 MCG/ACT AEPB INHALE 1 PUFF INTO LUNGS ONCE DAILY (Patient taking differently: Inhale 1 puff into the lungs daily.) 60 each 11   gabapentin  (NEURONTIN ) 300 MG capsule Take 300 mg by mouth at bedtime.     Glucagon  3 MG/DOSE POWD Place 3 mg into the nose once as needed for up  to 1 dose  (Hypoglycemia).     Glycerin-Hypromellose-PEG 400 (DRY EYE RELIEF DROPS OP) Place 1 drop into both eyes daily as needed (dry eye).     Insulin  Glargine (BASAGLAR  KWIKPEN) 100 UNIT/ML Inject 28 Units into the skin daily. 15 mL 1   Insulin  Infusion Pump (T:SLIM INSULIN  PUMP) DEVI 1 Dose by Does not apply route daily.     insulin  lispro (HUMALOG ) 100 UNIT/ML injection INJECT UP TO 100 UNITS IN THE INSULIN  PUMP A DAY (Patient taking differently: Inject 100 Units into the skin See admin instructions. INJECT UP TO 100 UNITS IN THE INSULIN  PUMP A DAY) 90 mL 3   loperamide (IMODIUM) 2 MG capsule Take 2 mg by mouth as needed for diarrhea or loose stools.     metoprolol  succinate (TOPROL -XL) 25 MG 24 hr tablet Take 1 tablet by mouth once daily 90 tablet 3   montelukast  (SINGULAIR ) 10 MG tablet Take 1 tablet (10 mg total) by mouth at bedtime. 90 tablet 3   nicotine  (NICODERM CQ  - DOSED IN MG/24 HR) 7 mg/24hr patch Place 7 mg onto the skin daily.     nitroGLYCERIN  (NITROSTAT ) 0.4 MG SL tablet Place 1 tablet (0.4 mg total) under the tongue every 5 (five) minutes as needed for chest pain. MAX 3 tab / 15 min 25 tablet 11   OLANZapine (ZYPREXA) 2.5 MG tablet Take 2.5 mg by mouth at bedtime.     pantoprazole  (PROTONIX ) 40 MG tablet Take 1 tablet (40 mg total) by mouth daily. 90 tablet 3   rizatriptan  (MAXALT ) 5 MG tablet Take 1 tablet (5 mg total) by mouth as needed for migraine. May repeat in 2 hours if needed 10 tablet 2   topiramate (TOPAMAX) 25 MG tablet Take 25 mg by mouth 2 (two) times daily.     triamcinolone  cream (KENALOG ) 0.1 % Apply 1 application topically 2 (two) times daily. (Patient taking differently: Apply 1 application  topically daily as needed (itching).) 30 g 0   Ubrogepant 50 MG TABS Take 50 mg by mouth 2 (two) times daily.     No current facility-administered medications on file prior to visit.   Allergies  Allergen Reactions   Cyclobenzaprine  Itching   Amoxicillin Itching   Codeine  Nausea Only   Penicillins Hives, Itching and Swelling    Did it involve swelling of the face/tongue/throat, SOB, or low BP? No Did it involve sudden or severe rash/hives, skin peeling, or any reaction on the inside of your mouth or nose? No Did you need to seek medical attention at a hospital or doctor's office? No When did it last happen?      Childhood If all above answers are NO, may proceed with cephalosporin use.   Prednisone  Itching and Swelling   Family History  Problem Relation Age of Onset   Heart attack Father 74   Diabetes Sister    Heart attack Brother 57   Heart disease Brother    Diabetes Maternal Grandmother    COPD Paternal Grandmother    PE: LMP  (LMP Unknown)  Wt Readings from Last 3 Encounters:  09/24/23 140 lb (63.5 kg)  06/25/23 142 lb 3.2 oz (64.5 kg)  06/02/23 142 lb 6.4 oz (64.6 kg)   Constitutional: normal weight, in NAD Eyes: no exophthalmos ENT: no masses palpated in neck, no cervical lymphadenopathy Cardiovascular: Tachycardia, RR, No MRG Respiratory: CTA B Musculoskeletal: no deformities Skin:  no rashes Neurological: no tremor with outstretched hands  ASSESSMENT: 1. DM1, uncontrolled,  with complications - history of DKA - CAD - NPDR - gastroparesis - PN - history of foot ulcer  06/17/2022:  Fasting glucose 106 C-peptide <0.10  2. HL  PLAN:  1. Patient with longstanding, uncontrolled, type 1 diabetes, only t:slim insulin  pump now, previously on OmniPod 5 pump, and also Dexcom G7 CGM integrated in the control IQ mode.  She does like the t:slim X2 pump more.  We have her on Humalog  per insurance preference.  At last visit, sugars were quite fluctuating, with blood sugars in the target range mostly, but they were increasing after every meal, particularly after lunch.  Upon reviewing the pump downloads, she appeared to be introducing carbs into the pump only after the sugars were already high after meals.  However, she reportedly was  entering carbs and starting the boluses before the meals.  Therefore, the blood sugar profile and the history did not align very well.  She was also drinking coffee throughout the day and putting milk in it.  She was ending up drinking a lot of milk.  She felt that this was the reason why the sugars were increasing and we discussed about stopping coffee later in the day.  She mentioned that she was not able to do so.  In that case, I advised her to stop milk and she wanted to try this.  In the meantime, to avoid low blood sugars after meals if she indeed was bolusing before then, I recommended to relax her insulin  to carb ratios and raise her glucose target. -I previously recommended that when eating a low-carb meal, to count 50% of the protein amount as grams.  I also recommended a reference book for more help managing the insulin  pump. CGM interpretation: -At today's visit, we reviewed her CGM downloads: It appears that *** of values are in target range (goal >70%), while *** are higher than 180 (goal <25%), and *** are lower than 70 (goal <4%).  The calculated average blood sugar is ***.  The projected HbA1c for the next 3 months (GMI) is ***. -Reviewing the CGM trends, ***  - I suggested to: Patient Instructions  Please use the following pump settings: - Basal rates: 12 am: 1.1 units/h - Insulin  to carb ratio: 12 am: 1:15 - Target: 12 am: 120 - Correction factor (insulin  sensitivity factor):  12 am: 1:100  - Active insulin  time: 5h  Start the bolus 15 min before every meal, even if the sugars are normal before the meal.  Try to stop milk. If sugars remain low, then stop coffee later in the day.  You should have an endocrinology follow-up appointment in 4 months.  - we checked her HbA1c: 7%  - advised to check sugars at different times of the day - 4x a day, rotating check times - advised for yearly eye exams >> she is not UTD - return to clinic in 4 months  2. HL - Functions are  all at goal at last visit: Lab Results  Component Value Date   CHOL 128 06/02/2023   HDL 54 06/02/2023   LDLCALC 56 06/02/2023   TRIG 96 06/02/2023   CHOLHDL 2.4 06/02/2023  - She continues on 80 mg of Lipitor  daily without side effects  Lela Fendt, MD PhD [facility]

## 2023-10-09 ENCOUNTER — Telehealth: Payer: Self-pay

## 2023-10-09 NOTE — Telephone Encounter (Signed)
   Pre-operative Risk Assessment    Patient Name: Melody Myers  DOB: [birth year] MRN: [id number]   Date of last office visit: 06/25/23 Date of next office visit: 12/26/23   Request for Surgical Clearance    Procedure:  Retropubic midurethral sling, Cystoscopy  Date of Surgery:  Clearance 11/06/23                                Surgeon:  Dr. Robert Surgeon's Group or Practice Name:  [facility] Phone number:  [phone] Fax number:  [phone]   Type of Clearance Requested:   - Pharmacy:  Hold Clopidogrel  (Plavix ) 10 Days  Aspirin  10 days   Type of Anesthesia:  Not Indicated   Additional requests/questions:    [name]   10/09/2023, 1:45 PM

## 2023-10-10 ENCOUNTER — Telehealth: Payer: Self-pay | Admitting: Dietician

## 2023-10-10 NOTE — Telephone Encounter (Signed)
 Patient called and stated that she needs to know who her supplier of her [employer] supplies is because she does not have needles to put the insulin  into the cartridge with.  Told patient that these should come together.  She will check the box.   Informed her of her supplier and provided the phone number. [employer]  [phone]  She is to call if she has further issues.  Leita Constable, RD, LDN, CDCES, DipACLM

## 2023-10-15 NOTE — Telephone Encounter (Signed)
   Patient Name: Melody Myers  DOB: 11-01-1980 MRN: [id number]  Primary Cardiologist: None  Chart reviewed as part of pre-operative protocol coverage. Given past medical history and time since last visit, based on ACC/AHA guidelines, Danijela Vessey is at acceptable risk for the planned procedure without further cardiovascular testing.   Per Dr. Krasowski on 10/13/2023 Should be acceptable from heart point of view to hold this medication and see if we can keep patient on aspirin  and discontinue only Plavix  that will be more optional but if both need to be held from surgical point of view for 5 to 7 days before procedure that should be acceptable   The patient was advised that if she develops new symptoms prior to surgery to contact our office to arrange for a follow-up visit, and she verbalized understanding.  I will route this recommendation to the requesting party via Epic fax function and remove from pre-op pool.  Please call with questions.  Lamarr Satterfield, NP 10/15/2023, 4:16 PM

## 2023-10-31 ENCOUNTER — Encounter: Payer: Self-pay | Admitting: Internal Medicine

## 2023-10-31 ENCOUNTER — Ambulatory Visit (INDEPENDENT_AMBULATORY_CARE_PROVIDER_SITE_OTHER): Admitting: Internal Medicine

## 2023-10-31 VITALS — BP 120/70 | HR 88 | Ht 64.0 in | Wt 144.0 lb

## 2023-10-31 DIAGNOSIS — E785 Hyperlipidemia, unspecified: Secondary | ICD-10-CM

## 2023-10-31 DIAGNOSIS — E1059 Type 1 diabetes mellitus with other circulatory complications: Secondary | ICD-10-CM | POA: Diagnosis not present

## 2023-10-31 LAB — POCT GLYCOSYLATED HEMOGLOBIN (HGB A1C): Hemoglobin A1C: 9 % — AB (ref 4.0–5.6)

## 2023-10-31 MED ORDER — INSULIN PEN NEEDLE 32G X 4 MM MISC: 3 refills | Status: AC

## 2023-10-31 MED ORDER — LANTUS SOLOSTAR 100 UNIT/ML ~~LOC~~ SOPN: 28.0000 [IU] | PEN_INJECTOR | Freq: Every day | SUBCUTANEOUS | 11 refills | Status: AC

## 2023-10-31 NOTE — Patient Instructions (Addendum)
 Please use the following pump settings: - Basal rates: 12 am: 1.1 units/h - Insulin  to carb ratio: 12 am: 1:15 - Target: 12 am: 110 - Correction factor (insulin  sensitivity factor):  12 am: 1:100 - Active insulin  time: 5h  Start the bolus 15 min before every meal, even if the sugars are normal before the meal.  Upgrade the Control IQ to version 7.9.  If there is still a discrepancy between the timing of the bolus and the meal, call the nr. on the back of the pump.  You should have an endocrinology follow-up appointment in 4 months.

## 2023-10-31 NOTE — Progress Notes (Signed)
 Patient ID: Melody Myers, female   DOB: 07/03/1980, 43 y.o.   MRN: [id number] This note was precharted on 10/02/2023.  HPI: Melody Myers is a 43 y.o.-year-old female, returning for follow-up for DM1, diagnosed in 2001, uncontrolled, with complications (history of DKA, CAD, NPDR, gastroparesis, PN, history of foot ulcer).  She previously saw [facility] and Dr. Kassie here in the practice, but last visit with me about was 5 months ago.  Previous visit was 1 year prior.  Interim history: She has chronic increased urination, blurry vision, nausea. Since last visit, she had problems with supplies and came off the pump for a period of time.  She was also not able to start Basaglar  in that period and was using only Humalog .  She is now back on the pump.  Reviewed HbA1c levels: Lab Results  Component Value Date   HGBA1C 8.5 (A) 06/02/2023   HGBA1C 8.3 (A) 05/30/2022   HGBA1C 7.7 (A) 02/28/2022  12/11/2022: HbA1c 7.8% 07/08/2022: HbA1c 7.8%  Insulin  pump: - previously on insulin  pump (Omnipod) in 2013 to early 2022.  She stopped when she changed endocrinologists. - then t:slim X2 - started 1st week of 01/2022 - on Omnipod 5 >>  now T:slim  CGM: -Dexcom G6 >> G7  Insulin : -NovoLog  >> now Humalog  per insurance preference  Supplier: - [facility]  Backup regimen: - Novolog  10-12 units, ICR 1:2 carb equiv., target 110, ISF ~25 - Basaglar  28 units hs  On insulin  pump:( she did not enter the following changes in the pump as suggested at last visit) - Basal rates: 12 am: 1.1 units/h - Insulin  to carb ratio: 12 am: 1:13 - Target: 12 am: 110 - Correction factor (insulin  sensitivity factor):  12 am: 1:100  - Active insulin  time: 5h  Total daily dose from basal insulin : 70% >> 66% (34 units) >> 70% (38 units) Total daily dose from bolus insulin : 30% >> 34% (18 units) >> 30% (17 units)  Meter: Accu-Chek  Pt checks her sugars more than 4 times a day with her  CGM:  Previously:  Previously    Lowest sugar was 37 (off pump) >> 55 >> 40s; she has hypoglycemia awareness at 60.  She has a glucagon  kit at home. No previous hypoglycemia admission.  Highest sugar was in the 500s >> .SABRA. 400s.  She has a history of DKA. On 02/17/2023, glucose was 684 after she had bent cannula.  She had DKA symptoms at that time.   Pt's meals are usually small.  - no CKD, last BUN/creatinine:  Lab Results  Component Value Date   BUN 10 09/24/2023   BUN 7 06/02/2023   CREATININE 0.61 09/24/2023   CREATININE 0.56 06/02/2023   Lab Results  Component Value Date   MICRALBCREAT NOTE 06/02/2023   Lab Results  Component Value Date   CHOL 128 06/02/2023   HDL 54 06/02/2023   LDLCALC 56 06/02/2023   TRIG 96 06/02/2023   CHOLHDL 2.4 06/02/2023  She is is on Lipitor  80 mg daily.  - last eye exam was in 02/2022: No DR reportedly.  - + numbness and tingling in her feet.  Last foot exam 06/02/2023.  TSH: Lab Results  Component Value Date   TSH 1.39 06/02/2023    She has a history of bipolar disease.  ROS: + see HPI  Past Medical History:  Diagnosis Date   Anal sphincter tear 01/22/2022   Anxiety    Asthma    Bipolar 1 disorder (HCC)  Bipolar depression (HCC) 03/20/2013   Carpal tunnel syndrome 02/04/2013   Formatting of this note might be different from the original. IMPRESSION: Order right CTI 79473- ASAP   Chest pain 12/24/2018   Chronic pain 03/20/2013   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   Formatting of this note might be different from the original. Repaired at [facility] in Sept 2016.  Pt states that all problems are still present.  If followed by [facility] for this. She has f/u appt.  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 10/14/2018   Degenerative disc disease, lumbar 06/05/2015   Formatting of this note might be different from the original. L4-5 > L3-4,  mild   Depression    patient reports bipolor   Depression    Diabetic neuropathy (HCC)    DKA (diabetic ketoacidoses) 03/19/2013   History of adenomatous polyp of colon 04/17/2015   Formatting of this note might be different from the original. Per path 2016   History of anxiety 04/17/2015   History of non-ST elevation myocardial infarction (NSTEMI) 04/30/2021   Hyperlipidemia LDL goal <70 10/04/2018   Hypokalemia 11/15/2014   Ingrown toenail of both feet 04/12/2019   Formatting of this note might be different from the original. great toenails bilateral   Insulin  dependent diabetes mellitus with complications    insulin  dependant   Jaw pain 02/24/2020   Keratoconjunctivitis sicca of both eyes not specified as Sjogren's 09/24/2016   Leukocytes in urine 06/08/2017   Leukocytosis 03/20/2013   Mild diastolic dysfunction 10/27/2020   Myocardial infarction (HCC) 10/02/2018   Myopia of both eyes 09/24/2016   Neuropathy    Non-ST elevation (NSTEMI) myocardial infarction (HCC) 10/02/2018   NSTEMI (non-ST elevated myocardial infarction) (HCC) 10/02/2018   Spontaneous bruising 08/26/2018   SUI (stress urinary incontinence, female) 12/11/2016   Superficial incisional surgical site infection 11/14/2014   Syncopal episodes    Tobacco abuse 03/20/2013   Urinary incontinence 06/08/2017   Urinary retention 11/14/2014   Vitreous floater, bilateral 09/24/2016   Past Surgical History:  Procedure Laterality Date   CORONARY STENT INTERVENTION N/A 10/02/2018   Procedure: CORONARY STENT INTERVENTION;  Surgeon: Mady Bruckner, MD;  Location: [facility] INVASIVE CV LAB;  Service: Cardiovascular;  Laterality: N/A;   LEFT HEART CATH AND CORONARY ANGIOGRAPHY N/A 10/02/2018   Procedure: LEFT HEART CATH AND CORONARY ANGIOGRAPHY;  Surgeon: Mady Bruckner, MD;  Location: [facility] INVASIVE CV LAB;  Service: Cardiovascular;  Laterality: N/A;   LEFT HEART CATH AND CORONARY ANGIOGRAPHY N/A 03/05/2022   Procedure: LEFT HEART  CATH AND CORONARY ANGIOGRAPHY;  Surgeon: Dann Candyce RAMAN, MD;  Location: [facility] INVASIVE CV LAB;  Service: Cardiovascular;  Laterality: N/A;   TUBAL LIGATION     VAGINA RECONSTRUCTION SURGERY  10/2014   & ana, reconstruction   Social History   Socioeconomic History   Marital status: Widowed    Spouse name: Not on file   Number of children: 2   Years of education: Not on file   Highest education level: Not on file  Occupational History   Occupation: Disabled  Tobacco Use   Smoking status: Every Day    Current packs/day: 0.25    Types: Cigarettes   Smokeless tobacco: Never   Tobacco comments:    2-3 cigarettes per day  Vaping Use   Vaping status: Never Used  Substance and Sexual Activity   Alcohol use: Not Currently    Comment: occasional   Drug  use: No   Sexual activity: Yes    Comment: tubal ligation  Other Topics Concern   Not on file  Social History Narrative   Not on file   Social Drivers of Health   Financial Resource Strain: High Risk (10/02/2018)   Overall Financial Resource Strain (CARDIA)    Difficulty of Paying Living Expenses: Hard  Food Insecurity: Low Risk  (12/10/2022)   Received from [facility]   Hunger Vital Sign    Within the past 12 months, you worried that your food would run out before you got money to buy more: Never true    Within the past 12 months, the food you bought just didn't last and you didn't have money to get more. : Never true  Transportation Needs: No Transportation Needs (12/10/2022)   Received from [facility]    In the past 12 months, has lack of reliable transportation kept you from medical appointments, meetings, work or from getting things needed for daily living? : No  Physical Activity: Insufficiently Active (10/02/2018)   Exercise Vital Sign    Days of Exercise per Week: 1 day    Minutes of Exercise per Session: 30 min  Stress: Stress Concern Present (10/02/2018)   [employer] of Occupational [employer]  - Occupational Stress Questionnaire    Feeling of Stress : Very much  Social Connections: Unknown (06/26/2021)   [facility]   Social Network    Social Network: Not on file  Intimate Partner Violence: Unknown (05/30/2021)   Received from [facility]   HITS    Physically Hurt: Not on file    Insult or Talk Down To: Not on file    Threaten Physical Harm: Not on file    Scream or Curse: Not on file   Current Outpatient Medications on File Prior to Visit  Medication Sig Dispense Refill   acetaminophen  (TYLENOL ) 325 MG tablet Take 650 mg by mouth every 6 (six) hours as needed for moderate pain or headache.      albuterol  (VENTOLIN  HFA) 108 (90 Base) MCG/ACT inhaler Inhale 2 puffs into the lungs every 6 (six) hours as needed for wheezing or shortness of breath. 8 g 1   aspirin  EC 81 MG EC tablet Take 1 tablet (81 mg total) by mouth daily.     atorvastatin  (LIPITOR ) 80 MG tablet Take 1 tablet by mouth once daily 90 tablet 2   benzonatate (TESSALON) 200 MG capsule Take 200 mg by mouth 3 (three) times daily as needed for cough.     busPIRone (BUSPAR) 10 MG tablet Take 10 mg by mouth 2 (two) times daily.     cephALEXin (KEFLEX) 500 MG capsule Take 500 mg by mouth 2 (two) times daily.     clopidogrel  (PLAVIX ) 75 MG tablet Take 1 tablet (75 mg total) by mouth daily. 90 tablet 2   Continuous Glucose Sensor (DEXCOM G7 SENSOR) MISC APPLY 1 SENSOR EVERY 10 DAYS (Patient taking differently: 1 Application by Other route daily.) 6 each 1   DULoxetine (CYMBALTA) 60 MG capsule Take 60 mg by mouth daily.     estradiol (ESTRACE) 0.1 MG/GM vaginal cream Place 1 Applicatorful vaginally daily.     fluticasone  (FLONASE ) 50 MCG/ACT nasal spray Place 2 sprays into both nostrils daily. 2 sprays each nostril at night (Patient taking differently: Place 2 sprays into both nostrils at bedtime.) 16 g 6   Fluticasone -Umeclidin-Vilant (TRELEGY ELLIPTA ) 100-62.5-25 MCG/ACT AEPB INHALE 1 PUFF INTO LUNGS ONCE  DAILY (Patient taking differently: Inhale 1 puff into the lungs daily.) 60 each 11   gabapentin  (NEURONTIN ) 300 MG capsule Take 300 mg by mouth at bedtime.     Glucagon  3 MG/DOSE POWD Place 3 mg into the nose once as needed for up to 1 dose (Hypoglycemia).     Glycerin-Hypromellose-PEG 400 (DRY EYE RELIEF DROPS OP) Place 1 drop into both eyes daily as needed (dry eye).     Insulin  Glargine (BASAGLAR  KWIKPEN) 100 UNIT/ML Inject 28 Units into the skin daily. 15 mL 1   Insulin  Infusion Pump (T:SLIM INSULIN  PUMP) DEVI 1 Dose by Does not apply route daily.     insulin  lispro (HUMALOG ) 100 UNIT/ML injection INJECT UP TO 100 UNITS IN THE INSULIN  PUMP A DAY (Patient taking differently: Inject 100 Units into the skin See admin instructions. INJECT UP TO 100 UNITS IN THE INSULIN  PUMP A DAY) 90 mL 3   loperamide (IMODIUM) 2 MG capsule Take 2 mg by mouth as needed for diarrhea or loose stools.     metoprolol  succinate (TOPROL -XL) 25 MG 24 hr tablet Take 1 tablet by mouth once daily 90 tablet 3   montelukast  (SINGULAIR ) 10 MG tablet Take 1 tablet (10 mg total) by mouth at bedtime. 90 tablet 3   nicotine  (NICODERM CQ  - DOSED IN MG/24 HR) 7 mg/24hr patch Place 7 mg onto the skin daily.     nitroGLYCERIN  (NITROSTAT ) 0.4 MG SL tablet Place 1 tablet (0.4 mg total) under the tongue every 5 (five) minutes as needed for chest pain. MAX 3 tab / 15 min 25 tablet 11   OLANZapine (ZYPREXA) 2.5 MG tablet Take 2.5 mg by mouth at bedtime.     pantoprazole  (PROTONIX ) 40 MG tablet Take 1 tablet (40 mg total) by mouth daily. 90 tablet 3   rizatriptan  (MAXALT ) 5 MG tablet Take 1 tablet (5 mg total) by mouth as needed for migraine. May repeat in 2 hours if needed 10 tablet 2   topiramate (TOPAMAX) 25 MG tablet Take 25 mg by mouth 2 (two) times daily.     triamcinolone  cream (KENALOG ) 0.1 % Apply 1 application topically 2 (two) times daily. (Patient taking differently: Apply 1 application  topically daily as needed (itching).) 30 g 0    Ubrogepant 50 MG TABS Take 50 mg by mouth 2 (two) times daily.     No current facility-administered medications on file prior to visit.   Allergies  Allergen Reactions   Cyclobenzaprine  Itching   Amoxicillin Itching   Codeine Nausea Only   Penicillins Hives, Itching and Swelling    Did it involve swelling of the face/tongue/throat, SOB, or low BP? No Did it involve sudden or severe rash/hives, skin peeling, or any reaction on the inside of your mouth or nose? No Did you need to seek medical attention at a hospital or doctor's office? No When did it last happen?      Childhood If all above answers are NO, may proceed with cephalosporin use.   Prednisone  Itching and Swelling   Family History  Problem Relation Age of Onset   Heart attack Father 75   Diabetes Sister    Heart attack Brother 63   Heart disease Brother    Diabetes Maternal Grandmother    COPD Paternal Grandmother    PE: BP 120/70   Pulse 88   Ht 5' 4 (1.626 m)   Wt 144 lb (65.3 kg)   SpO2 94%   BMI 24.72 kg/m  Wt Readings  from Last 3 Encounters:  10/31/23 144 lb (65.3 kg)  09/24/23 140 lb (63.5 kg)  06/25/23 142 lb 3.2 oz (64.5 kg)   Constitutional: normal weight, in NAD Eyes: no exophthalmos ENT: no masses palpated in neck, no cervical lymphadenopathy Cardiovascular: Tachycardia, RR, No MRG Respiratory: CTA B Musculoskeletal: no deformities Skin:  no rashes Neurological: no tremor with outstretched hands  ASSESSMENT: 1. DM1, uncontrolled, with complications - history of DKA - CAD - NPDR - gastroparesis - PN - history of foot ulcer  06/17/2022:  Fasting glucose 106 C-peptide <0.10  2. HL  PLAN:  1. Patient with longstanding, uncontrolled, type 1 diabetes, only t:slim insulin  pump now, previously on OmniPod 5 pump, and also Dexcom G7 CGM integrated in the control IQ mode.  She does like the t:slim X2 pump more.  We have her on Humalog  per insurance preference.  At last visit, sugars were  quite fluctuating, with blood sugars in the target range mostly, but they were increasing after every meal, particularly after lunch.  Upon reviewing the pump downloads, she appeared to be introducing carbs into the pump only after the sugars were already high after meals.  However, she reportedly was entering carbs and starting the boluses before the meals.  Therefore, the blood sugar profile and the history did not align very well.  She was also drinking coffee throughout the day and putting milk in it.  She was ending up drinking a lot of milk.  She felt that this was the reason why the sugars were increasing and we discussed about stopping coffee later in the day.  She mentioned that she was not able to do so.  In that case, I advised her to stop milk and she wanted to try this.  In the meantime, to avoid low blood sugars after meals if she indeed was bolusing before then, I recommended to relax her insulin  to carb ratios and raise her glucose target. -I previously recommended that when eating a low-carb meal, to count 50% of the protein amount as grams.  I also recommended a reference book for more help managing the insulin  pump. CGM interpretation: -At today's visit, we reviewed her CGM downloads: It appears that 50% of values are in target range (goal >70%), while 50% are higher than 180 (goal <25%), and 0% are lower than 70 (goal <4%).  The calculated average blood sugar is 185.  The projected HbA1c for the next 3 months (GMI) is 7.7%. -Reviewing the CGM trends, sugars appear to be quite fluctuating, increasing after morning meal and after the rest of the meals, also.  They are particularly higher after dinner.  They do improve overnight.  Upon reviewing individual daily tracings, she is missing some boluses with meals and we discussed about trying to cover all of the meals with insulin , however, even when she does boluses, despite the fact that she mentions that she is always bolusing in advance of the  meal, they appear to be entered when sugars are already high after meals.  I am not sure about why we are seeing this the discrepancy.  I did advise her to upgrade the control IQ program to the latest version, 7.9, and if this problem persists, to call the customer service number on the back of her pump.  She adamantly denies that she is doing the boluses too late.  In this case, it is standing to strengthen her insulin  to carb ratios, however, reviewing her pump downloads, she actually forgot to  relax the ICR's at last visit so I did not suggest to do this now. -She mentions that she was able to eliminate milk since last visit. - I suggested to: Patient Instructions  Please use the following pump settings: - Basal rates: 12 am: 1.1 units/h - Insulin  to carb ratio: 12 am: 1:15 - Target: 12 am: 110 - Correction factor (insulin  sensitivity factor):  12 am: 1:100 - Active insulin  time: 5h  Start the bolus 15 min before every meal, even if the sugars are normal before the meal.  Upgrade the Control IQ to version 7.9.  If there is still a discrepancy between the timing of the bolus and the meal, call the nr. on the back of the pump.  You should have an endocrinology follow-up appointment in 4 months.  - we checked her HbA1c: 9.0% (higher) - advised to check sugars at different times of the day - 4x a day, rotating check times - advised for yearly eye exams >> she is not UTD - return to clinic in 4 months  2. HL - Latest lipid panel was at goal: Lab Results  Component Value Date   CHOL 128 06/02/2023   HDL 54 06/02/2023   LDLCALC 56 06/02/2023   TRIG 96 06/02/2023   CHOLHDL 2.4 06/02/2023  - Continues 80 mg of Lipitor -no side effects  Lela Fendt, MD PhD [facility]

## 2023-10-31 NOTE — Addendum Note (Signed)
 Addended by: CLEOTILDE ROLIN RAMAN on: 10/31/2023 02:04 PM   Modules accepted: Orders

## 2023-11-03 ENCOUNTER — Encounter: Payer: Self-pay | Admitting: Cardiology

## 2023-11-07 ENCOUNTER — Other Ambulatory Visit: Payer: Self-pay | Admitting: Pulmonary Disease

## 2023-11-07 DIAGNOSIS — J4540 Moderate persistent asthma, uncomplicated: Secondary | ICD-10-CM

## 2023-12-21 ENCOUNTER — Other Ambulatory Visit: Payer: Self-pay | Admitting: Cardiology

## 2023-12-21 DIAGNOSIS — I2510 Atherosclerotic heart disease of native coronary artery without angina pectoris: Secondary | ICD-10-CM

## 2023-12-26 ENCOUNTER — Ambulatory Visit: Admitting: Cardiology

## 2024-01-02 ENCOUNTER — Encounter: Payer: Self-pay | Admitting: Cardiology

## 2024-01-02 ENCOUNTER — Ambulatory Visit: Attending: Cardiology | Admitting: Cardiology

## 2024-01-02 VITALS — BP 100/68 | HR 91 | Ht 64.0 in | Wt 144.0 lb

## 2024-01-02 DIAGNOSIS — R079 Chest pain, unspecified: Secondary | ICD-10-CM

## 2024-01-02 DIAGNOSIS — K3184 Gastroparesis: Secondary | ICD-10-CM

## 2024-01-02 DIAGNOSIS — K219 Gastro-esophageal reflux disease without esophagitis: Secondary | ICD-10-CM

## 2024-01-02 DIAGNOSIS — E1143 Type 2 diabetes mellitus with diabetic autonomic (poly)neuropathy: Secondary | ICD-10-CM

## 2024-01-02 DIAGNOSIS — F17200 Nicotine dependence, unspecified, uncomplicated: Secondary | ICD-10-CM | POA: Diagnosis not present

## 2024-01-02 DIAGNOSIS — E785 Hyperlipidemia, unspecified: Secondary | ICD-10-CM

## 2024-01-02 DIAGNOSIS — E1059 Type 1 diabetes mellitus with other circulatory complications: Secondary | ICD-10-CM

## 2024-01-02 DIAGNOSIS — I2510 Atherosclerotic heart disease of native coronary artery without angina pectoris: Secondary | ICD-10-CM

## 2024-01-02 NOTE — Progress Notes (Signed)
 Cardiology Office Note:    Date:  01/02/2024   ID:  Melody Myers, DOB September 23, 1980, MRN [id number]  PCP:  Burnie Calton Fuller, PA-C  Cardiologist:  Lamar Fitch, MD    Referring MD: Burnie Calton Shaker*   Chief Complaint  Patient presents with   Follow-up  I have pain into my chest  History of Present Illness:    Melody Myers is a 43 y.o. female past medical history significant for coronary artery disease in August she had PTCA and stent of obtuse marginal branch in face of acute myocardial infarction additional problem include type 1 diabetes, vaping, dyslipidemia, some psychological issues.  She also has gastroparesis and gastroesophageal reflux disease.  She comes for regular follow-up and she says she is having chest pain she said pain starts in the morning when she start getting out of her bed and lasts all day and sometimes eases off a little bit and she said that taking Tums will help it is not exertional.  Taking deep breath relieves the pain somewhat.  It is different pain compared to pain that she had before.  Past Medical History:  Diagnosis Date   Anal sphincter tear 01/22/2022   Anxiety    Asthma    Bipolar 1 disorder (HCC)    Bipolar depression (HCC) 03/20/2013   Carpal tunnel syndrome 02/04/2013   Formatting of this note might be different from the original. IMPRESSION: Order right CTI 79473- ASAP   Chest pain 12/24/2018   Chronic pain 03/20/2013   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   Formatting of this note might be different from the original. Repaired at [facility] in Sept 2016.  Pt states that all problems are still present.  If followed by [facility] for this. She has f/u appt.  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 10/14/2018   Degenerative disc disease, lumbar 06/05/2015   Formatting of this note might be different from the original. L4-5 > L3-4, mild    Depression    patient reports bipolor   Depression    Diabetic neuropathy (HCC)    DKA (diabetic ketoacidoses) 03/19/2013   History of adenomatous polyp of colon 04/17/2015   Formatting of this note might be different from the original. Per path 2016   History of anxiety 04/17/2015   History of non-ST elevation myocardial infarction (NSTEMI) 04/30/2021   Hyperlipidemia LDL goal <70 10/04/2018   Hypokalemia 11/15/2014   Ingrown toenail of both feet 04/12/2019   Formatting of this note might be different from the original. great toenails bilateral   Insulin  dependent diabetes mellitus with complications    insulin  dependant   Jaw pain 02/24/2020   Keratoconjunctivitis sicca of both eyes not specified as Sjogren's 09/24/2016   Leukocytes in urine 06/08/2017   Leukocytosis 03/20/2013   Mild diastolic dysfunction 10/27/2020   Myocardial infarction (HCC) 10/02/2018   Myopia of both eyes 09/24/2016   Neuropathy    Non-ST elevation (NSTEMI) myocardial infarction (HCC) 10/02/2018   NSTEMI (non-ST elevated myocardial infarction) (HCC) 10/02/2018   Spontaneous bruising 08/26/2018   SUI (stress urinary incontinence, female) 12/11/2016   Superficial incisional surgical site infection 11/14/2014   Syncopal episodes    Tobacco abuse 03/20/2013   Urinary incontinence 06/08/2017   Urinary retention 11/14/2014   Vitreous floater, bilateral 09/24/2016    Past Surgical History:  Procedure Laterality Date   CORONARY STENT INTERVENTION N/A 10/02/2018   Procedure: CORONARY STENT INTERVENTION;  Surgeon:  End, Lonni, MD;  Location: [facility] INVASIVE CV LAB;  Service: Cardiovascular;  Laterality: N/A;   LEFT HEART CATH AND CORONARY ANGIOGRAPHY N/A 10/02/2018   Procedure: LEFT HEART CATH AND CORONARY ANGIOGRAPHY;  Surgeon: Mady Lonni, MD;  Location: [facility] INVASIVE CV LAB;  Service: Cardiovascular;  Laterality: N/A;   LEFT HEART CATH AND CORONARY ANGIOGRAPHY N/A 03/05/2022   Procedure: LEFT HEART CATH AND  CORONARY ANGIOGRAPHY;  Surgeon: Dann Candyce RAMAN, MD;  Location: [facility] INVASIVE CV LAB;  Service: Cardiovascular;  Laterality: N/A;   TUBAL LIGATION     VAGINA RECONSTRUCTION SURGERY  10/2014   & ana, reconstruction    Current Medications: Current Meds  Medication Sig   acetaminophen  (TYLENOL ) 325 MG tablet Take 650 mg by mouth every 6 (six) hours as needed for moderate pain or headache.    albuterol  (VENTOLIN  HFA) 108 (90 Base) MCG/ACT inhaler Inhale 2 puffs into the lungs every 6 (six) hours as needed for wheezing or shortness of breath.   aspirin  EC 81 MG EC tablet Take 1 tablet (81 mg total) by mouth daily.   atorvastatin  (LIPITOR ) 80 MG tablet Take 1 tablet by mouth once daily   clopidogrel  (PLAVIX ) 75 MG tablet Take 1 tablet (75 mg total) by mouth daily.   Continuous Glucose Sensor (DEXCOM G7 SENSOR) MISC APPLY 1 SENSOR EVERY 10 DAYS   estradiol (ESTRACE) 0.1 MG/GM vaginal cream Place 1 Applicatorful vaginally daily.   fluticasone  (FLONASE ) 50 MCG/ACT nasal spray Place 2 sprays into both nostrils daily. 2 sprays each nostril at night   Fluticasone -Umeclidin-Vilant (TRELEGY ELLIPTA ) 100-62.5-25 MCG/ACT AEPB INHALE 1 PUFF INTO LUNGS ONCE DAILY   gabapentin  (NEURONTIN ) 300 MG capsule Take 300 mg by mouth at bedtime.   Glucagon  3 MG/DOSE POWD Place 3 mg into the nose once as needed for up to 1 dose (Hypoglycemia).   Glycerin-Hypromellose-PEG 400 (DRY EYE RELIEF DROPS OP) Place 1 drop into both eyes daily as needed (dry eye).   insulin  glargine (LANTUS  SOLOSTAR) 100 UNIT/ML Solostar Pen Inject 28 Units into the skin daily.   Insulin  Infusion Pump (T:SLIM INSULIN  PUMP) DEVI 1 Dose by Does not apply route daily.   insulin  lispro (HUMALOG ) 100 UNIT/ML injection INJECT UP TO 100 UNITS IN THE INSULIN  PUMP A DAY (Patient taking differently: Inject 100 Units into the skin See admin instructions. INJECT UP TO 100 UNITS IN THE INSULIN  PUMP A DAY)   Insulin  Pen Needle 32G X 4 MM MISC Use 1x a day    ipratropium-albuterol  (DUONEB) 0.5-2.5 (3) MG/3ML SOLN Inhale 3 mLs into the lungs every 6 (six) hours as needed (shortness of breath).   loperamide (IMODIUM) 2 MG capsule Take 2 mg by mouth as needed for diarrhea or loose stools.   metoprolol  succinate (TOPROL -XL) 25 MG 24 hr tablet Take 1 tablet by mouth once daily   montelukast  (SINGULAIR ) 10 MG tablet Take 1 tablet (10 mg total) by mouth at bedtime.   nitroGLYCERIN  (NITROSTAT ) 0.4 MG SL tablet Place 1 tablet (0.4 mg total) under the tongue every 5 (five) minutes as needed for chest pain. MAX 3 tab / 15 min   OLANZapine (ZYPREXA) 2.5 MG tablet Take 2.5 mg by mouth at bedtime.   pantoprazole  (PROTONIX ) 40 MG tablet Take 1 tablet (40 mg total) by mouth daily.   rizatriptan  (MAXALT ) 5 MG tablet Take 1 tablet (5 mg total) by mouth as needed for migraine. May repeat in 2 hours if needed   topiramate (TOPAMAX) 25 MG tablet Take 25 mg  by mouth 2 (two) times daily.   triamcinolone  cream (KENALOG ) 0.1 % Apply 1 application topically 2 (two) times daily. (Patient taking differently: Apply 1 application  topically daily as needed (itching).)   Ubrogepant 50 MG TABS Take 50 mg by mouth 2 (two) times daily.     Allergies:   Cyclobenzaprine , Amoxicillin, Codeine, Penicillins, and Prednisone    Social History   Socioeconomic History   Marital status: Widowed    Spouse name: Not on file   Number of children: 2   Years of education: Not on file   Highest education level: Not on file  Occupational History   Occupation: Disabled  Tobacco Use   Smoking status: Former    Current packs/day: 0.25    Types: Cigarettes   Smokeless tobacco: Never   Tobacco comments:    2-3 cigarettes per day  Vaping Use   Vaping status: Some Days  Substance and Sexual Activity   Alcohol use: Not Currently    Comment: occasional   Drug use: No   Sexual activity: Yes    Comment: tubal ligation  Other Topics Concern   Not on file  Social History Narrative   Not on  file   Social Drivers of Health   Financial Resource Strain: High Risk (10/02/2018)   Overall Financial Resource Strain (CARDIA)    Difficulty of Paying Living Expenses: Hard  Food Insecurity: Low Risk  (12/10/2022)   Received from [facility]   Hunger Vital Sign    Within the past 12 months, you worried that your food would run out before you got money to buy more: Never true    Within the past 12 months, the food you bought just didn't last and you didn't have money to get more. : Never true  Transportation Needs: No Transportation Needs (12/10/2022)   Received from [facility]    In the past 12 months, has lack of reliable transportation kept you from medical appointments, meetings, work or from getting things needed for daily living? : No  Physical Activity: Insufficiently Active (10/02/2018)   Exercise Vital Sign    Days of Exercise per Week: 1 day    Minutes of Exercise per Session: 30 min  Stress: Stress Concern Present (10/02/2018)   [employer] of [employer] - Occupational Stress Questionnaire    Feeling of Stress : Very much  Social Connections: Unknown (06/26/2021)   Received from [employer]   Social Network    Social Network: Not on file     Family History: The patient's family history includes COPD in her paternal grandmother; Diabetes in her maternal grandmother and sister; Heart attack (age of onset: 42) in her brother; Heart attack (age of onset: 90) in her father; Heart disease in her brother. ROS:   Please see the history of present illness.    All 14 point review of systems negative except as described per history of present illness  EKGs/Labs/Other Studies Reviewed:    EKG Interpretation Date/Time:  Friday January 02 2024 14:27:57 EST Ventricular Rate:  91 PR Interval:  130 QRS Duration:  88 QT Interval:  350 QTC Calculation: 430 R Axis:   53  Text Interpretation: Normal sinus rhythm Normal ECG When compared with ECG of  25-Jun-2023 08:29, No significant change was found Confirmed by Bernie Charleston [phone]) on 01/02/2024 2:43:32 PM    Recent Labs: 06/02/2023: TSH 1.39 09/24/2023: ALT 13; BUN 10; Creatinine, Ser 0.61; Hemoglobin 15.4; Platelets 272.0; Potassium 3.9; Pro B  Natriuretic peptide (BNP) 11.0; Sodium 134  Recent Lipid Panel    Component Value Date/Time   CHOL 128 06/02/2023 1520   CHOL 100 06/19/2020 0844   TRIG 96 06/02/2023 1520   HDL 54 06/02/2023 1520   HDL 40 06/19/2020 0844   CHOLHDL 2.4 06/02/2023 1520   VLDL 20.2 08/06/2021 0844   LDLCALC 56 06/02/2023 1520    Physical Exam:    VS:  BP 100/68   Pulse 91   Ht 5' 4 (1.626 m)   Wt 144 lb (65.3 kg)   SpO2 96%   BMI 24.72 kg/m     Wt Readings from Last 3 Encounters:  01/02/24 144 lb (65.3 kg)  10/31/23 144 lb (65.3 kg)  09/24/23 140 lb (63.5 kg)     GEN:  Well nourished, well developed in no acute distress HEENT: Normal NECK: No JVD; No carotid bruits LYMPHATICS: No lymphadenopathy CARDIAC: RRR, no murmurs, no rubs, no gallops RESPIRATORY:  Clear to auscultation without rales, wheezing or rhonchi  ABDOMEN: Soft, non-tender, non-distended MUSCULOSKELETAL:  No edema; No deformity  SKIN: Warm and dry LOWER EXTREMITIES: no swelling NEUROLOGIC:  Alert and oriented x 3 PSYCHIATRIC:  Normal affect   ASSESSMENT:    1. Coronary artery disease involving native coronary artery of native heart without angina pectoris   2. Tobacco use disorder   3. Gastroesophageal reflux disease without esophagitis   4. Gastroparesis due to DM (HCC)   5. Type 1 diabetes mellitus with other circulatory complication (HCC)   6. Hyperlipidemia LDL goal <70    PLAN:    In order of problems listed above:  Chest pain look atypical even though I will ask her to have troponin I done.  I also refer her to our GI specialist to see if we will be able to address that issue.  In the meantime continue antiplatelet therapy. Dyslipidemia she is taking  Lipitor  80 which is high intense statin, her LDL 56 HDL 54 this is from April of this year good control continue present management. Diabetes, I did review KPN which show me her hemoglobin A1c 9.0 she told me that she is doing well from diabetes point review but I do not think that is the case. sHe still smoking she does not smoke anymore but vapes of course I told her this is also better and she needs to stop doing it.   Medication Adjustments/Labs and Tests Ordered: Current medicines are reviewed at length with the patient today.  Concerns regarding medicines are outlined above.  Orders Placed This Encounter  Procedures   EKG 12-Lead   Medication changes: No orders of the defined types were placed in this encounter.   Signed, Lamar DOROTHA Fitch, MD, [facility] 01/02/2024 2:51 PM    [facility]

## 2024-01-02 NOTE — Patient Instructions (Signed)
 Medication Instructions:  Your physician recommends that you continue on your current medications as directed. Please refer to the Current Medication list given to you today.  *If you need a refill on your cardiac medications before your next appointment, please call your pharmacy*   Lab Work: Troponin I- today If you have labs (blood work) drawn today and your tests are completely normal, you will receive your results only by: MyChart Message (if you have MyChart) OR A paper copy in the mail If you have any lab test that is abnormal or we need to change your treatment, we will call you to review the results.   Testing/Procedures: None Ordered   Follow-Up: At [facility], you and your health needs are our priority.  As part of our continuing mission to provide you with exceptional heart care, we have created designated Provider Care Teams.  These Care Teams include your primary Cardiologist (physician) and Advanced Practice Providers (APPs -  Physician Assistants and Nurse Practitioners) who all work together to provide you with the care you need, when you need it.  We recommend signing up for the patient portal called MyChart.  Sign up information is provided on this After Visit Summary.  MyChart is used to connect with patients for Virtual Visits (Telemedicine).  Patients are able to view lab/test results, encounter notes, upcoming appointments, etc.  Non-urgent messages can be sent to your provider as well.   To learn more about what you can do with MyChart, go to [email or web address].    Your next appointment:   6 month(s)  The format for your next appointment:   In Person  Provider:   Lamar Fitch, MD    Other Instructions Referral to GI

## 2024-01-02 NOTE — Addendum Note (Signed)
 Addended by: ARLOA PLANAS D on: 01/02/2024 03:05 PM   Modules accepted: Orders

## 2024-01-03 LAB — TROPONIN T: Troponin T (Highly Sensitive): <6 ng/L (ref 0–14)

## 2024-01-09 ENCOUNTER — Telehealth: Payer: Self-pay | Admitting: Dietician

## 2024-01-09 ENCOUNTER — Other Ambulatory Visit: Payer: Self-pay | Admitting: Internal Medicine

## 2024-01-09 DIAGNOSIS — E1059 Type 1 diabetes mellitus with other circulatory complications: Secondary | ICD-10-CM

## 2024-01-09 MED ORDER — DEXCOM G7 SENSOR MISC: 3 refills | Status: AC

## 2024-01-09 MED ORDER — DEXCOM G7 SENSOR MISC
0 refills | Status: DC
Start: 2024-01-09 — End: 2024-01-09

## 2024-01-09 NOTE — Telephone Encounter (Signed)
 Patient's daughter, Kaitlyn Baker, came in to office today and picked up 1 sample of Dexcom G7 sensor.

## 2024-01-09 NOTE — Addendum Note (Signed)
 Addended by: CLEOTILDE ROLIN RAMAN on: 01/09/2024 11:45 AM   Modules accepted: Orders

## 2024-01-09 NOTE — Telephone Encounter (Signed)
 Patient states that she is out of Dexcom G7 sensors.  Refill request sent to [facility] pool.  Dexcom G7 Lot [id number] Expiration 03/27/2025 left at the front desk for patient to pick up.  Instructed patient to call Dexcom regarding faulty Dexcom sensors.    Leita Constable, RD, LDN, CDCES, DipACLM

## 2024-02-03 ENCOUNTER — Ambulatory Visit: Admitting: Pulmonary Disease

## 2024-03-01 ENCOUNTER — Ambulatory Visit: Admitting: Internal Medicine

## 2024-03-05 ENCOUNTER — Encounter: Payer: Self-pay | Admitting: Internal Medicine

## 2024-03-05 ENCOUNTER — Ambulatory Visit: Admitting: Internal Medicine

## 2024-03-05 DIAGNOSIS — E1059 Type 1 diabetes mellitus with other circulatory complications: Secondary | ICD-10-CM

## 2024-03-05 LAB — POCT GLYCOSYLATED HEMOGLOBIN (HGB A1C): Hemoglobin A1C: 8.6 % — AB (ref 4.0–5.6)

## 2024-03-05 MED ORDER — GVOKE HYPOPEN 1-PACK 1 MG/0.2ML ~~LOC~~ SOAJ: 1.0000 mg | SUBCUTANEOUS | 99 refills | Status: AC | PRN

## 2024-03-05 NOTE — Patient Instructions (Addendum)
 Please use the following pump settings: - Basal rates: 12 am: 1.1 units/h - Insulin  to carb ratio: 12 am: 1:13 - Target: 12 am: 120 >> 110 - Correction factor (insulin  sensitivity factor):  12 am: 1:100 - Active insulin  time: 5h  Start the bolus 15 min before every meal, even if the sugars are normal before the meal.  Upgrade the Control IQ to version 7.9 or 7.10.  Please return in 4 months.

## 2024-03-05 NOTE — Progress Notes (Signed)
 Patient ID: Melody Myers, female   DOB: [birth year], 44 y.o.   MRN: [id number]  HPI: Melody Myers is a 44 y.o.-year-old female, returning for follow-up for DM1, diagnosed in 2001, uncontrolled, with complications (history of DKA, CAD, NPDR, gastroparesis, PN, history of foot ulcer).  She previously saw [facility] and Dr. Kassie here in the practice, but last visit with me about was 4 months ago.  Interim history: She has chronic increased urination, blurry vision, have nausea -sickness. Her PCP left practice and she has an appointment with a new PCP later this month.  Reviewed HbA1c levels: Lab Results  Component Value Date   HGBA1C 9.0 (A) 10/31/2023   HGBA1C 8.5 (A) 06/02/2023   HGBA1C 8.3 (A) 05/30/2022  12/11/2022: HbA1c 7.8% 07/08/2022: HbA1c 7.8%  Insulin  pump: - previously on insulin  pump (Omnipod) in 2013 to early 2022.  She stopped when she changed endocrinologists. - then t:slim X2 - started 1st week of 01/2022 - on Omnipod 5 >>  now T:slim  CGM: -Dexcom G6 >> G7  Insulin : -NovoLog  >> now Humalog  per insurance preference  Supplier: - [employer]  Backup regimen: - Novolog  10-12 units, ICR 1:2 carb equiv., target 110, ISF ~25 - Basaglar  28 units hs  On insulin  pump: - Basal rates: 12 am: 1.1 units/h - Insulin  to carb ratio: 12 am: 1:13 - Target: 12 am: 110 - Correction factor (insulin  sensitivity factor):  12 am: 1:100  - Active insulin  time: 5h  Total daily dose from basal insulin : 70% >> 66% (34 units) >> 70% (38 units) >> 69% (34 units) Total daily dose from bolus insulin : 30% >> 34% (18 units) >> 30% (17 units) >> 31% (15 units) Total daily dose: 50-80 units  Meter: Accu-Chek  Pt checks her sugars more than 4 times a day with her CGM:   Previously:  Previously:  Lowest sugar was 37 (off pump) >> 55 >> 40s >> 42; she has hypoglycemia awareness at 60.  She has a glucagon  kit at home. No previous hypoglycemia admission.  Highest sugar  was in the 500s >> .SABRASABRA 400s >> 300s.  She has a history of DKA. On 02/17/2023, glucose was 684 after she had bent cannula.  She had DKA symptoms at that time.   Pt's meals are usually small.  - no CKD, last BUN/creatinine:  Lab Results  Component Value Date   BUN 10 09/24/2023   BUN 7 06/02/2023   CREATININE 0.61 09/24/2023   CREATININE 0.56 06/02/2023   Lab Results  Component Value Date   MICRALBCREAT NOTE 06/02/2023   Lab Results  Component Value Date   CHOL 128 06/02/2023   HDL 54 06/02/2023   LDLCALC 56 06/02/2023   TRIG 96 06/02/2023   CHOLHDL 2.4 06/02/2023  She is is on Lipitor  80 mg daily.  - last eye exam was in 02/2022: No DR reportedly.  - + numbness and tingling in her feet.  Last foot exam 06/02/2023.  TSH: Lab Results  Component Value Date   TSH 1.39 06/02/2023    She has a history of bipolar disease.  ROS: + see HPI  Past Medical History:  Diagnosis Date   Anal sphincter tear 01/22/2022   Anxiety    Asthma    Bipolar 1 disorder (HCC)    Bipolar depression (HCC) 03/20/2013   Carpal tunnel syndrome 02/04/2013   Formatting of this note might be different from the original. IMPRESSION: Order right CTI 79473- ASAP   Chest pain 12/24/2018  Chronic pain 03/20/2013   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   Formatting of this note might be different from the original. Repaired at [facility] in Sept 2016.  Pt states that all problems are still present.  If followed by [facility] for this. She has f/u appt.  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 10/14/2018   Degenerative disc disease, lumbar 06/05/2015   Formatting of this note might be different from the original. L4-5 > L3-4, mild   Depression    patient reports bipolor   Depression    Diabetic neuropathy (HCC)    DKA (diabetic ketoacidoses) 03/19/2013   History of adenomatous polyp of colon 04/17/2015   Formatting  of this note might be different from the original. Per path 2016   History of anxiety 04/17/2015   History of non-ST elevation myocardial infarction (NSTEMI) 04/30/2021   Hyperlipidemia LDL goal <70 10/04/2018   Hypokalemia 11/15/2014   Ingrown toenail of both feet 04/12/2019   Formatting of this note might be different from the original. great toenails bilateral   Insulin  dependent diabetes mellitus with complications    insulin  dependant   Jaw pain 02/24/2020   Keratoconjunctivitis sicca of both eyes not specified as Sjogren's 09/24/2016   Leukocytes in urine 06/08/2017   Leukocytosis 03/20/2013   Mild diastolic dysfunction 10/27/2020   Myocardial infarction (HCC) 10/02/2018   Myopia of both eyes 09/24/2016   Neuropathy    Non-ST elevation (NSTEMI) myocardial infarction (HCC) 10/02/2018   NSTEMI (non-ST elevated myocardial infarction) (HCC) 10/02/2018   Spontaneous bruising 08/26/2018   SUI (stress urinary incontinence, female) 12/11/2016   Superficial incisional surgical site infection 11/14/2014   Syncopal episodes    Tobacco abuse 03/20/2013   Urinary incontinence 06/08/2017   Urinary retention 11/14/2014   Vitreous floater, bilateral 09/24/2016   Past Surgical History:  Procedure Laterality Date   CORONARY STENT INTERVENTION N/A 10/02/2018   Procedure: CORONARY STENT INTERVENTION;  Surgeon: Mady Bruckner, MD;  Location: [facility] INVASIVE CV LAB;  Service: Cardiovascular;  Laterality: N/A;   LEFT HEART CATH AND CORONARY ANGIOGRAPHY N/A 10/02/2018   Procedure: LEFT HEART CATH AND CORONARY ANGIOGRAPHY;  Surgeon: Mady Bruckner, MD;  Location: [facility] INVASIVE CV LAB;  Service: Cardiovascular;  Laterality: N/A;   LEFT HEART CATH AND CORONARY ANGIOGRAPHY N/A 03/05/2022   Procedure: LEFT HEART CATH AND CORONARY ANGIOGRAPHY;  Surgeon: Dann Candyce RAMAN, MD;  Location: [facility] INVASIVE CV LAB;  Service: Cardiovascular;  Laterality: N/A;   TUBAL LIGATION     VAGINA RECONSTRUCTION SURGERY  10/2014    & ana, reconstruction   Social History   Socioeconomic History   Marital status: Widowed    Spouse name: Not on file   Number of children: 2   Years of education: Not on file   Highest education level: Not on file  Occupational History   Occupation: Disabled  Tobacco Use   Smoking status: Former    Current packs/day: 0.25    Types: Cigarettes   Smokeless tobacco: Never   Tobacco comments:    2-3 cigarettes per day  Vaping Use   Vaping status: Some Days  Substance and Sexual Activity   Alcohol use: Not Currently    Comment: occasional   Drug use: No   Sexual activity: Yes    Comment: tubal ligation  Other Topics Concern   Not on file  Social History Narrative   Not on file   Social Drivers of  Health   Tobacco Use: Medium Risk (01/02/2024)   Patient History    Smoking Tobacco Use: Former    Smokeless Tobacco Use: Never    Passive Exposure: Not on file  Financial Resource Strain: Not on file  Food Insecurity: Low Risk (12/10/2022)   Received from [facility]   Epic    Within the past 12 months, you worried that your food would run out before you got money to buy more: Never true    Within the past 12 months, the food you bought just didn't last and you didn't have money to get more. : Never true  Transportation Needs: No Transportation Needs (12/10/2022)   Received from [facility]    In the past 12 months, has lack of reliable transportation kept you from medical appointments, meetings, work or from getting things needed for daily living? : No  Physical Activity: Not on file  Stress: Not on file  Social Connections: Unknown (06/26/2021)   Received from [facility]   Social Network    Social Network: Not on file  Intimate Partner Violence: Unknown (05/30/2021)   Received from [facility]   HITS    Physically Hurt: Not on file    Insult or Talk Down To: Not on file    Threaten Physical Harm: Not on file    Scream or Curse: Not on file   Depression (PHQ2-9): Low Risk (12/03/2021)   Depression (PHQ2-9)    PHQ-2 Score: 0  Alcohol Screen: Not on file  Housing: Medium Risk (12/10/2022)   Received from [facility]   Epic    What is your living situation today?: I have a place to live today, but I am worried about losing it in the future    Think about the place you live. Do you have problems with any of the following? Choose all that apply:: None/None on this list  Utilities: Low Risk (12/10/2022)   Received from [facility]   Utilities    In the past 12 months has the electric, gas, oil, or water company threatened to shut off services in your home? : No  Health Literacy: Not on file   Current Outpatient Medications on File Prior to Visit  Medication Sig Dispense Refill   acetaminophen  (TYLENOL ) 325 MG tablet Take 650 mg by mouth every 6 (six) hours as needed for moderate pain or headache.      albuterol  (VENTOLIN  HFA) 108 (90 Base) MCG/ACT inhaler Inhale 2 puffs into the lungs every 6 (six) hours as needed for wheezing or shortness of breath. 8 g 1   aspirin  EC 81 MG EC tablet Take 1 tablet (81 mg total) by mouth daily.     atorvastatin  (LIPITOR ) 80 MG tablet Take 1 tablet by mouth once daily 90 tablet 2   clopidogrel  (PLAVIX ) 75 MG tablet Take 1 tablet (75 mg total) by mouth daily. 90 tablet 1   Continuous Glucose Sensor (DEXCOM G7 SENSOR) MISC USE 1 SENSOR EVERY 10 DAYS 9 each 3   estradiol (ESTRACE) 0.1 MG/GM vaginal cream Place 1 Applicatorful vaginally daily.     fluticasone  (FLONASE ) 50 MCG/ACT nasal spray Place 2 sprays into both nostrils daily. 2 sprays each nostril at night 16 g 6   Fluticasone -Umeclidin-Vilant (TRELEGY ELLIPTA ) 100-62.5-25 MCG/ACT AEPB INHALE 1 PUFF INTO LUNGS ONCE DAILY 180 each 1   gabapentin  (NEURONTIN ) 300 MG capsule Take 300 mg by mouth at bedtime.     Glucagon  3 MG/DOSE POWD Place 3  mg into the nose once as needed for up to 1 dose (Hypoglycemia).     Glycerin-Hypromellose-PEG 400 (DRY  EYE RELIEF DROPS OP) Place 1 drop into both eyes daily as needed (dry eye).     insulin  glargine (LANTUS  SOLOSTAR) 100 UNIT/ML Solostar Pen Inject 28 Units into the skin daily. 15 mL 11   Insulin  Infusion Pump (T:SLIM INSULIN  PUMP) DEVI 1 Dose by Does not apply route daily.     insulin  lispro (HUMALOG ) 100 UNIT/ML injection INJECT UP TO 100 UNITS IN THE INSULIN  PUMP A DAY (Patient taking differently: Inject 100 Units into the skin See admin instructions. INJECT UP TO 100 UNITS IN THE INSULIN  PUMP A DAY) 90 mL 3   Insulin  Pen Needle 32G X 4 MM MISC Use 1x a day 50 each 3   ipratropium-albuterol  (DUONEB) 0.5-2.5 (3) MG/3ML SOLN Inhale 3 mLs into the lungs every 6 (six) hours as needed (shortness of breath).     loperamide (IMODIUM) 2 MG capsule Take 2 mg by mouth as needed for diarrhea or loose stools.     metoprolol  succinate (TOPROL -XL) 25 MG 24 hr tablet Take 1 tablet by mouth once daily 90 tablet 3   montelukast  (SINGULAIR ) 10 MG tablet Take 1 tablet (10 mg total) by mouth at bedtime. 90 tablet 3   nitroGLYCERIN  (NITROSTAT ) 0.4 MG SL tablet Place 1 tablet (0.4 mg total) under the tongue every 5 (five) minutes as needed for chest pain. MAX 3 tab / 15 min 25 tablet 11   OLANZapine (ZYPREXA) 2.5 MG tablet Take 2.5 mg by mouth at bedtime.     pantoprazole  (PROTONIX ) 40 MG tablet Take 1 tablet (40 mg total) by mouth daily. 90 tablet 3   rizatriptan  (MAXALT ) 5 MG tablet Take 1 tablet (5 mg total) by mouth as needed for migraine. May repeat in 2 hours if needed 10 tablet 2   topiramate (TOPAMAX) 25 MG tablet Take 25 mg by mouth 2 (two) times daily.     triamcinolone  cream (KENALOG ) 0.1 % Apply 1 application topically 2 (two) times daily. (Patient taking differently: Apply 1 application  topically daily as needed (itching).) 30 g 0   Ubrogepant 50 MG TABS Take 50 mg by mouth 2 (two) times daily.     No current facility-administered medications on file prior to visit.   Allergies  Allergen Reactions    Cyclobenzaprine  Itching   Amoxicillin Itching   Codeine Nausea Only   Penicillins Hives, Itching and Swelling    Did it involve swelling of the face/tongue/throat, SOB, or low BP? No Did it involve sudden or severe rash/hives, skin peeling, or any reaction on the inside of your mouth or nose? No Did you need to seek medical attention at a hospital or doctor's office? No When did it last happen?      Childhood If all above answers are NO, may proceed with cephalosporin use.   Prednisone  Itching and Swelling   Family History  Problem Relation Age of Onset   Heart attack Father 21   Diabetes Sister    Heart attack Brother 52   Heart disease Brother    Diabetes Maternal Grandmother    COPD Paternal Grandmother    PE: BP 120/60   Pulse 73   Ht 5' 4 (1.626 m)   Wt 144 lb 3.2 oz (65.4 kg)   SpO2 97%   BMI 24.75 kg/m  Wt Readings from Last 3 Encounters:  03/05/24 144 lb 3.2 oz (65.4 kg)  01/02/24 144 lb (65.3 kg)  10/31/23 144 lb (65.3 kg)   Constitutional: normal weight, in NAD Eyes: no exophthalmos ENT: no masses palpated in neck, no cervical lymphadenopathy Cardiovascular: RRR, No MRG Respiratory: CTA B Musculoskeletal: no deformities Skin:  no rashes Neurological: no tremor with outstretched hands Diabetic Foot Exam - Simple   Simple Foot Form Diabetic Foot exam was performed with the following findings: Yes 03/05/2024  2:04 PM  Visual Inspection No deformities, no ulcerations, no other skin breakdown bilaterally: Yes Sensation Testing See comments: Yes Pulse Check Posterior Tibialis and Dorsalis pulse intact bilaterally: Yes Comments + no sensation to monofilament B toes, starts to have sensation medial to the midfoot B    ASSESSMENT: 1. DM1, uncontrolled, with complications - history of DKA - CAD - NPDR - gastroparesis - PN - history of foot ulcer  06/17/2022:  Fasting glucose 106 C-peptide <0.10  2. HL  PLAN:  1. Patient with longstanding,  uncontrolled, type 1 diabetes, on the t:slim insulin  pump integrated with a Dexcom G7 CGM, and the control IQ mode.  She was previously on the OmniPod 5 pump but she likes the current pump more. - She was previously drinking coffee throughout the day and putting milk in it.  She was ending up drinking a lot of milk.  She felt that this was the reason why the sugars were increasing and we discussed about stopping coffee later in the day.  She mentioned that she was not able to do so.  In that case, I advised her to stop adding milk.  She was able to eliminate milk before last visit. - I previously recommended that when eating a low-carb meal, to count 50% of the protein amount as grams.  I also recommended a reference book for more help managing the insulin  pump. -At last visit, reviewing the CGM trends, sugars appeared to be quite fluctuating, increasing after the morning meal and after the rest of the meals, also.  They were particularly higher after dinner and improving overnight.  Reviewing individual daily tracings, she was missing some boluses with meals and we discussed about trying to cover all of her meals with insulin .  Also, when she was entering carbs, the entry was too late, when the sugars were already high after meals.  She mentioned that she was always bolusing in advance, however.  I did not understand the discrepancy so I advised her to upgrade the control IQ program to the 7.9 version and if the problem persistED, to call customer service.  We did not change the pump settings at that time. CGM interpretation: -At today's visit, we reviewed her CGM downloads: It appears that 57% of values are in target range (goal >70%), while 42% are higher than 180 (goal <25%), and 1.6% are lower than 70 (goal <4%).  The calculated average blood sugar is 172.  The projected HbA1c for the next 3 months (GMI) is 7.4%. -Reviewing the CGM trends, sugars appear to improve overnight and then increase after  breakfast and more so after dinner, with the highest blood sugars being between 7 and 9 PM.  Sugars do improve afterwards but slowly during the night, with a nadir around 5 AM. -Upon reviewing individual daily tracings, she is still not entering carbs consistently in the pump, but she is doing a better job with this.  She enters the carbs too late and we discussed about trying to set reminders on her phone to remember to inject insulin  15 minutes before  the meals.  She has larger dinners and we discussed about trying to enter all of the carbs and start the boluses before the meal.  She also has occasional lows if she boluses too late for the meals. -To further improve her blood sugars I did advise her to reduce her target from 120s to 110 but I did not suggest other setting changes.  She does need to upgrade the pump but she does not have access to a computer.  She is trying to go to her daughter's house to do this. - I suggested to: Patient Instructions  Please use the following pump settings: - Basal rates: 12 am: 1.1 units/h - Insulin  to carb ratio: 12 am: 1:13 - Target: 12 am: 120 >> 110 - Correction factor (insulin  sensitivity factor):  12 am: 1:100 - Active insulin  time: 5h  Start the bolus 15 min before every meal, even if the sugars are normal before the meal.  Upgrade the Control IQ to version 7.9 or 7.10.  Please return in 4 months.  - we checked her HbA1c: 8.6% (slightly lower) - advised to check sugars at different times of the day - 4x a day, rotating check times - advised for yearly eye exams >> she is UTD - return to clinic in 3-4 months  2. HL - Latest lipid panel showed fractions at goal: Lab Results  Component Value Date   CHOL 128 06/02/2023   HDL 54 06/02/2023   LDLCALC 56 06/02/2023   TRIG 96 06/02/2023   CHOLHDL 2.4 06/02/2023  - She continues Lipitor  80 mg daily without side effects  Lela Fendt, MD PhD [facility] Endocrinology

## 2024-04-01 ENCOUNTER — Ambulatory Visit: Admitting: Cardiology

## 2024-04-26 ENCOUNTER — Ambulatory Visit: Admitting: Cardiology

## 2024-07-05 ENCOUNTER — Ambulatory Visit: Admitting: Internal Medicine
